# Patient Record
Sex: Male | Born: 1955 | Race: Black or African American | Hispanic: No | Marital: Single | Smoking: Former smoker
Health system: Southern US, Community
[De-identification: ages and names within clinical notes are randomized; demographics above are authoritative.]

## PROBLEM LIST (undated history)

## (undated) DIAGNOSIS — D649 Anemia, unspecified: Secondary | ICD-10-CM

## (undated) DIAGNOSIS — Q2730 Arteriovenous malformation, site unspecified: Secondary | ICD-10-CM

## (undated) DIAGNOSIS — I4891 Unspecified atrial fibrillation: Secondary | ICD-10-CM

## (undated) DIAGNOSIS — I10 Essential (primary) hypertension: Secondary | ICD-10-CM

## (undated) DIAGNOSIS — Z860100 Personal history of colon polyps, unspecified: Secondary | ICD-10-CM

## (undated) DIAGNOSIS — I5022 Chronic systolic (congestive) heart failure: Secondary | ICD-10-CM

## (undated) DIAGNOSIS — I469 Cardiac arrest, cause unspecified: Secondary | ICD-10-CM

## (undated) DIAGNOSIS — I428 Other cardiomyopathies: Secondary | ICD-10-CM

## (undated) DIAGNOSIS — J189 Pneumonia, unspecified organism: Secondary | ICD-10-CM

## (undated) HISTORY — DX: Anemia, unspecified: D64.9

## (undated) HISTORY — DX: Essential (primary) hypertension: I10

## (undated) HISTORY — DX: Pneumonia, unspecified organism: J18.9

## (undated) HISTORY — DX: Personal history of colonic polyps: Z86.010

## (undated) HISTORY — DX: Arteriovenous malformation, site unspecified: Q27.30

## (undated) HISTORY — DX: Personal history of colon polyps, unspecified: Z86.0100

## (undated) HISTORY — DX: Unspecified atrial fibrillation: I48.91

## (undated) HISTORY — DX: Cardiac arrest, cause unspecified: I46.9

## (undated) HISTORY — DX: Chronic systolic (congestive) heart failure: I50.22

---

## 1996-11-15 DIAGNOSIS — I469 Cardiac arrest, cause unspecified: Secondary | ICD-10-CM

## 1996-11-15 HISTORY — PX: CARDIAC DEFIBRILLATOR PLACEMENT: SHX171

## 1996-11-15 HISTORY — DX: Cardiac arrest, cause unspecified: I46.9

## 2006-08-17 ENCOUNTER — Ambulatory Visit: Payer: Self-pay | Admitting: Cardiology

## 2006-09-13 ENCOUNTER — Inpatient Hospital Stay (HOSPITAL_COMMUNITY): Admission: AD | Admit: 2006-09-13 | Discharge: 2006-09-16 | Payer: Self-pay | Admitting: Internal Medicine

## 2006-09-13 ENCOUNTER — Ambulatory Visit: Payer: Self-pay | Admitting: Cardiology

## 2006-09-21 ENCOUNTER — Ambulatory Visit: Payer: Self-pay | Admitting: Cardiology

## 2007-04-21 ENCOUNTER — Ambulatory Visit: Payer: Self-pay | Admitting: Cardiology

## 2007-05-01 ENCOUNTER — Ambulatory Visit: Payer: Self-pay | Admitting: Internal Medicine

## 2007-06-08 ENCOUNTER — Ambulatory Visit: Payer: Self-pay | Admitting: Cardiology

## 2007-07-24 ENCOUNTER — Ambulatory Visit: Payer: Self-pay | Admitting: Cardiology

## 2007-08-28 ENCOUNTER — Ambulatory Visit: Payer: Self-pay | Admitting: Internal Medicine

## 2007-10-24 ENCOUNTER — Ambulatory Visit: Payer: Self-pay

## 2007-11-02 ENCOUNTER — Ambulatory Visit: Payer: Self-pay | Admitting: Cardiology

## 2007-11-15 ENCOUNTER — Encounter: Payer: Self-pay | Admitting: Cardiology

## 2007-11-15 ENCOUNTER — Ambulatory Visit: Payer: Self-pay | Admitting: Internal Medicine

## 2007-11-15 ENCOUNTER — Ambulatory Visit (HOSPITAL_COMMUNITY): Admission: RE | Admit: 2007-11-15 | Discharge: 2007-11-15 | Payer: Self-pay | Admitting: Internal Medicine

## 2007-11-23 ENCOUNTER — Ambulatory Visit: Payer: Self-pay

## 2007-11-24 ENCOUNTER — Ambulatory Visit: Payer: Self-pay | Admitting: Cardiology

## 2007-12-14 ENCOUNTER — Ambulatory Visit: Payer: Self-pay | Admitting: Cardiology

## 2008-02-02 ENCOUNTER — Ambulatory Visit: Payer: Self-pay | Admitting: Internal Medicine

## 2008-05-31 ENCOUNTER — Ambulatory Visit: Payer: Self-pay | Admitting: Internal Medicine

## 2008-07-30 ENCOUNTER — Encounter: Payer: Self-pay | Admitting: Cardiology

## 2008-09-03 ENCOUNTER — Ambulatory Visit: Payer: Self-pay | Admitting: Cardiology

## 2008-09-13 ENCOUNTER — Ambulatory Visit: Payer: Self-pay | Admitting: Internal Medicine

## 2009-01-24 ENCOUNTER — Encounter: Payer: Self-pay | Admitting: Internal Medicine

## 2009-01-31 ENCOUNTER — Ambulatory Visit: Payer: Self-pay | Admitting: Internal Medicine

## 2009-03-13 ENCOUNTER — Ambulatory Visit: Payer: Self-pay | Admitting: Cardiology

## 2009-03-20 ENCOUNTER — Encounter: Payer: Self-pay | Admitting: Cardiology

## 2009-05-09 ENCOUNTER — Encounter: Payer: Self-pay | Admitting: Internal Medicine

## 2009-05-09 ENCOUNTER — Ambulatory Visit: Payer: Self-pay

## 2009-07-29 DIAGNOSIS — I5022 Chronic systolic (congestive) heart failure: Secondary | ICD-10-CM | POA: Insufficient documentation

## 2009-07-29 DIAGNOSIS — I4891 Unspecified atrial fibrillation: Secondary | ICD-10-CM | POA: Insufficient documentation

## 2009-08-22 ENCOUNTER — Encounter: Payer: Self-pay | Admitting: Internal Medicine

## 2009-08-22 ENCOUNTER — Ambulatory Visit: Payer: Self-pay | Admitting: Cardiology

## 2009-10-03 ENCOUNTER — Encounter: Payer: Self-pay | Admitting: Cardiology

## 2009-11-05 ENCOUNTER — Encounter: Payer: Self-pay | Admitting: Cardiology

## 2009-11-10 ENCOUNTER — Ambulatory Visit: Payer: Self-pay | Admitting: Cardiology

## 2009-11-10 DIAGNOSIS — Z95810 Presence of automatic (implantable) cardiac defibrillator: Secondary | ICD-10-CM | POA: Insufficient documentation

## 2009-12-26 ENCOUNTER — Ambulatory Visit: Payer: Self-pay | Admitting: Internal Medicine

## 2010-04-10 ENCOUNTER — Ambulatory Visit: Payer: Self-pay | Admitting: Internal Medicine

## 2010-04-17 ENCOUNTER — Encounter: Payer: Self-pay | Admitting: Internal Medicine

## 2010-04-17 ENCOUNTER — Ambulatory Visit: Payer: Self-pay | Admitting: Cardiology

## 2010-04-28 ENCOUNTER — Encounter: Payer: Self-pay | Admitting: Internal Medicine

## 2010-04-28 ENCOUNTER — Telehealth: Payer: Self-pay | Admitting: Internal Medicine

## 2010-05-05 ENCOUNTER — Encounter: Payer: Self-pay | Admitting: Physician Assistant

## 2010-05-05 ENCOUNTER — Ambulatory Visit: Payer: Self-pay | Admitting: Cardiology

## 2010-05-06 ENCOUNTER — Telehealth (INDEPENDENT_AMBULATORY_CARE_PROVIDER_SITE_OTHER): Payer: Self-pay | Admitting: *Deleted

## 2010-05-15 ENCOUNTER — Encounter: Payer: Self-pay | Admitting: Internal Medicine

## 2010-05-20 ENCOUNTER — Encounter (INDEPENDENT_AMBULATORY_CARE_PROVIDER_SITE_OTHER): Payer: Self-pay | Admitting: *Deleted

## 2010-05-22 ENCOUNTER — Ambulatory Visit (HOSPITAL_COMMUNITY): Admission: RE | Admit: 2010-05-22 | Discharge: 2010-05-22 | Payer: Self-pay | Admitting: Internal Medicine

## 2010-05-22 ENCOUNTER — Ambulatory Visit: Payer: Self-pay | Admitting: Internal Medicine

## 2010-06-01 ENCOUNTER — Encounter: Payer: Self-pay | Admitting: Internal Medicine

## 2010-06-01 ENCOUNTER — Ambulatory Visit: Payer: Self-pay

## 2010-06-26 ENCOUNTER — Ambulatory Visit: Payer: Self-pay | Admitting: Internal Medicine

## 2010-07-17 ENCOUNTER — Ambulatory Visit: Payer: Self-pay | Admitting: Internal Medicine

## 2010-08-04 ENCOUNTER — Ambulatory Visit: Payer: Self-pay | Admitting: Cardiology

## 2010-08-12 ENCOUNTER — Telehealth (INDEPENDENT_AMBULATORY_CARE_PROVIDER_SITE_OTHER): Payer: Self-pay | Admitting: *Deleted

## 2010-08-21 ENCOUNTER — Encounter: Payer: Self-pay | Admitting: Cardiology

## 2010-08-21 ENCOUNTER — Ambulatory Visit: Payer: Self-pay | Admitting: Cardiology

## 2010-09-10 ENCOUNTER — Ambulatory Visit: Payer: Self-pay | Admitting: Internal Medicine

## 2010-09-30 ENCOUNTER — Encounter: Payer: Self-pay | Admitting: Cardiology

## 2010-12-07 ENCOUNTER — Encounter: Payer: Self-pay | Admitting: Internal Medicine

## 2010-12-07 ENCOUNTER — Ambulatory Visit
Admission: RE | Admit: 2010-12-07 | Discharge: 2010-12-07 | Payer: Self-pay | Source: Home / Self Care | Attending: Internal Medicine | Admitting: Internal Medicine

## 2010-12-15 NOTE — Medication Information (Signed)
Summary: transferring CCR care from Dr. Linna Darner [facility])  Anticoagulant Therapy  Managed by: Vashti Hey, RN PCP: [place] Miners MD: Antoine Poche MD, Fayrene Fearing Indication 1: Atrial Fibrillation Lab Used: [facility] [place] Site: [place] INR POC 1.6  Dietary changes: no    Health status changes: no    Bleeding/hemorrhagic complications: no    Recent/future hospitalizations: no    Any changes in medication regimen? no    Recent/future dental: no  Any missed doses?: no       Is patient compliant with meds? yes      Comments: Coumadin has been managed by Dr Linna Darner.  Will be having managed here now.  Allergies: No Known Drug Allergies  Anticoagulation Management History:      The patient is taking warfarin and comes in today for a routine follow up visit.  Negative risk factors for bleeding include an age less than 87 years old.  The bleeding index is 'low risk'.  Positive CHADS2 values include History of CHF and History of HTN.  Negative CHADS2 values include Age > 87 years old.  Anticoagulation responsible provider: Antoine Poche MD, Fayrene Fearing.  INR POC: 1.6.  Cuvette Lot#: [id number].    Anticoagulation Management Assessment/Plan:      The patient's current anticoagulation dose is Warfarin sodium 5 mg tabs: Use as directed by Dr. Linna Darner, Warfarin sodium 2 mg tabs: use as directed.  The target INR is 2.0-3.0.  The next INR is due 08/14/2010.  Anticoagulation instructions were given to patient.  Results were reviewed/authorized by Vashti Hey, RN.  He was notified by Vashti Hey RN.        Coagulation management information includes: Pt has been taking 7.5mg  once daily.  Has been on and off dose for dental extractions.  Current Anticoagulation Instructions: INR 1.6 Take coumadin 10mg  tonight and tomorrow night then resume 7.5mg  once daily

## 2010-12-15 NOTE — Cardiovascular Report (Signed)
Summary: Card Device Clinic/ INTERROGATION QUICK LOOK  Card Device Clinic/ INTERROGATION QUICK LOOK   Imported By: Dorise Hiss 09/11/2010 12:24:45  _____________________________________________________________________  External Attachment:    Type:   Image     Comment:   External Document

## 2010-12-15 NOTE — Miscellaneous (Signed)
Summary: 2 D ECHO  Clinical Lists Changes

## 2010-12-15 NOTE — Assessment & Plan Note (Signed)
Summary: pacer ck per Belenda Cruise needs to see Dr. Johney Frame on this visit/ vs      Allergies Added: NKDA  Visit Type:  Pacemaker check Primary Provider:  [facility]   History of Present Illness: The patient presents today for routine electrophysiology followup. He reports doing very well since last being seen in our clinic. His ICD pocket has healed nicely s/p pocket revision by me due to threatened erosion.  The device was placed into a subpectoral pocket 7/11. He continues to have poor dentition which has not been  addressed. The patient denies symptoms of palpitations, fevers/ chills, chest pain, shortness of breath, orthopnea, PND, lower extremity edema, dizziness, presyncope, syncope, or neurologic sequela. The patient is tolerating medications without difficulties and is otherwise without complaint today.   Preventive Screening-Counseling & Management  Alcohol-Tobacco     Smoking Status: quit     Year Quit: 1998  Current Medications (verified): 1)  Potassium Chloride Cr 10 Meq Cr-Caps (Potassium Chloride) .... Take One Tablet By Mouth Daily 2)  Lisinopril 10 Mg Tabs (Lisinopril) .... Take 1 Tablet By Mouth Once A Day 3)  Diltiazem Hcl Er Beads 180 Mg Xr24h-Cap (Diltiazem Hcl Er Beads) .... Take One Capsule By Mouth Daily 4)  Metoprolol Tartrate 100 Mg Tabs (Metoprolol Tartrate) .... Take One Tablet By Mouth Twice A Day 5)  Finasteride 5 Mg Tabs (Finasteride) .... Take 1 Tablet By Mouth Once A Day 6)  Warfarin Sodium 5 Mg Tabs (Warfarin Sodium) .... Use As Directed By Dr. Linna Darner 7)  Miralax  Powd (Polyethylene Glycol 3350) .... Take As Directed 8)  Warfarin Sodium 2 Mg Tabs (Warfarin Sodium) .... Use As Directed 9)  Vitamin D3 2000 Unit Caps (Cholecalciferol) .... Take 1 Tablet By Mouth Once A Day  Allergies (verified): No Known Drug Allergies  Comments:  Nurse/Medical Assistant: The patient is currently on medications but does not know the name or dosage at this time.  Instructed to contact our office with details. Will update medication list at that time.  Past History:  Past Medical History: Reviewed history from 08/21/2010 and no changes required. SYSTOLIC HEART FAILURE, CHRONIC (ICD-428.22) HYPERTENSION, UNSPECIFIED (ICD-401.9) ATRIAL FIBRILLATION (ICD-427.31) TACHYCARDIA (ICD-785) 1. Tachycardia - induced cardiomyopathy.     a.     Normalized LVF...EF 35-40% by 2-D echo, 6/11     b.     Status post Medtronic ICD implantation. 2. Permanent atrial fibrillation.     a.     Possible TIKOSYN intolerance. 3. Chronic systolic heart failure.     a.     Current NYHA class I heart failure. 4. Chronic Coumadin.     a.     Followed by Dr. Linna Darner. 5. Hypertension.     Past Surgical History: s/p ICD 11/15/07 with subsequent pocket revision 7/11 for threatened erosion  Social History: Reviewed history from 07/29/2009 and no changes required. Disabled  Single  Tobacco Use - Former.  Alcohol Use - no  Review of Systems       All systems are reviewed and negative except as listed in the HPI.   Vital Signs:  Patient profile:   55 year old male Height:      77 inches Weight:      291 pounds Pulse rate:   73 / minute BP sitting:   123 / 81  (left arm) Cuff size:   large  Vitals Entered By: Carlye Grippe (September 10, 2010 1:44 PM)  Physical Exam  General:  obese, chronically ill  Head:  normocephalic and atraumatic Eyes:  PERRLA/EOM intact; conjunctiva and lids normal. Mouth:  very poor dentition without obvious infection today Neck:  supple Chest Wall:  threatened erosion appears resolved no evidence of infection recent incision is well healed Lungs:  Clear bilaterally to auscultation and percussion. Heart:  iRRR, no m/r/g Abdomen:  Bowel sounds positive; abdomen soft and non-tender without masses, organomegaly, or hernias noted. No hepatosplenomegaly. Msk:  Back normal, normal gait. Muscle strength and tone normal. Pulses:  pulses  normal in all 4 extremities Extremities:  No clubbing or cyanosis. Neurologic:  Alert and oriented x 3.    ICD Specifications Following MD:  Hillis Range, MD     Referring MD:  [facility] ICD Vendor:  Medtronic     ICD Model Number:  [id number]     ICD Serial Number:  [id number] ICD DOI:  11/15/2007     ICD Implanting MD:  Sherryl Manges, MD  Lead 1:    Location: RA     DOI: 07/23/1997     Model #: 6940     Serial #: [id number]     Status: active Lead 2:    Location: RV     DOI: 07/23/1997     Model #: 6945     Serial #: [id number]     Status: active Lead 3:    Location: RV     DOI: 07/23/1997     Model #: [id number]     Serial #: [id number]     Status: active  Indications::  NICM   ICD Follow Up Battery Voltage:  2.92 V     Charge Time:  10.0 seconds     Underlying rhythm:  SR ICD Dependent:  No       ICD Device Measurements Atrium:  Amplitude: 0.4 mV, Impedance: 528 ohms,  Right Ventricle:  Amplitude: 5.4 mV, Impedance: 208 ohms, Threshold: 1.0 V at 0.4 msec Shock Impedance: 41/65 ohms   Episodes MS Episodes:  1     Percent Mode Switch:  100%     Coumadin:  Yes Shock:  0     ATP:  0     Nonsustained:  0     Atrial Therapies:  0 Atrial Pacing:  1.1%     Ventricular Pacing:  20.5%  Brady Parameters Mode DDIR     Lower Rate Limit:  50     Upper Rate Limit 130 PAV 300      Tachy Zones VF:  222     Next Cardiology Appt Due:  11/16/2010 Tech Comments:  PT IN AF 100% OF TIME. + COUMADIN.  RV LEAD IMPEDANCE 208 ON TODAYS CHECK.  RV LEAD ALERT TURNED OFF AT LAST VISIT.  DECREASED VP FROM LAST VISIT. NO CHANGES MADE. ROV IN 3 MTHS W/DEVICE CLINIC. sign MD Comments:  agree  Impression & Recommendations:  Problem # 1:  SYSTOLIC HEART FAILURE, CHRONIC (ICD-428.22) stable without active chf on exam normal ICD function given high risk for infection, I opted to not revise RV lead during pocket revision for threatened erosion 7/11 he has a chronically low RV lead pace/sense impedance which appears to  be stable,  V pacing much demenished since last visit  Problem # 2:  ATRIAL FIBRILLATION (ICD-427.31) probably premanent afib continue coumadin rate controlled  Problem # 3:  HYPERTENSION, UNSPECIFIED (ICD-401.9) stable  Patient Instructions: 1)  return in 3 months to the device clinic.

## 2010-12-15 NOTE — Cardiovascular Report (Signed)
Summary: Card Device Clinic/ INTERROGATION QUICK LOOK  Card Device Clinic/ INTERROGATION QUICK LOOK   Imported By: Dorise Hiss 04/16/2010 10:57:50  _____________________________________________________________________  External Attachment:    Type:   Image     Comment:   External Document

## 2010-12-15 NOTE — Letter (Signed)
Summary: Engineer, materials at [facility]  [address]   [place], [place] [address]   Phone: [phone]  Fax: [phone]        May 20, 2010 MRN: [id number]   [name] [name] [address] [place], [place]  [address]   Dear [name] [name],  Your test ordered by Selena Batten has been reviewed by your physician (or physician assistant) and was found to be normal or stable. Your physician (or physician assistant) felt no changes were needed at this time.  ____ Echocardiogram  ____ Cardiac Stress Test  ____ Lab Work  ____ Peripheral vascular study of arms, legs or neck  __X__ CT scan or X-ray - chest x-ray stable, no congestive heart failure  ____ Lung or Breathing test  ____ Other:   Thank you.   Hoover Brunette, LPN    Duane Boston, M.D., F.A.C.C. Thressa Sheller, M.D., F.A.C.C. Oneal Grout, M.D., F.A.C.C. Cheree Ditto, M.D., F.A.C.C. Daiva Nakayama, M.D., F.A.C.C. Kenney Houseman, M.D., F.A.C.C. Jeanne Ivan, PA-C

## 2010-12-15 NOTE — Letter (Signed)
Summary: Implantable Device Instructions  Architectural technologist, Main Office  [address]   [place], [place] [address]   Phone: [phone]  Fax: [phone]      Implantable Device Instructions  You are scheduled for:  _____ Permanent Transvenous Pacemaker _____ Implantable Cardioverter Defibrillator _____ Implantable Loop Recorder _____ Generator Change __X___ Pocket Revision  on FRIDAY, May 22, 2010 with Dr. Johney Frame.  1.  Please arrive at the Short Stay Center at [facility] at 10 AM on the day of your procedure.  2.  Do not eat or drink after midnight the night before your procedure.  3.  Complete lab work on May 15, 2010 AT THE [facility] IN [place].  You do not have to be fasting.  4.  Ok to take medications morning of procedure with water.  Take your last dose of Coumadin on 05-18-10.  5.  Plan for an overnight stay.  Bring your insurance cards and a list of your medications.  6.  Wash your chest and neck with antibacterial soap (any brand) the evening before and the morning of your procedure.  Rinse well.   *If you have ANY questions after you get home, please call the office [phone].  *Every attempt is made to prevent procedures from being rescheduled.  Due to the nauture of Electrophysiology, rescheduling can happen.  The physician is always aware and directs the staff when this occurs.

## 2010-12-15 NOTE — Procedures (Signed)
Summary: DF2      Allergies Added: NKDA  Current Medications (verified): 1)  Potassium Chloride Cr 10 Meq Cr-Caps (Potassium Chloride) .... Take One Tablet By Mouth Daily 2)  Lisinopril 5 Mg Tabs (Lisinopril) .... Take One Tablet By Mouth Daily 3)  Diltiazem Hcl Er Beads 180 Mg Xr24h-Cap (Diltiazem Hcl Er Beads) .... Take One Capsule By Mouth Daily 4)  Metoprolol Tartrate 100 Mg Tabs (Metoprolol Tartrate) .... Take One Tablet By Mouth Twice A Day 5)  Finasteride 5 Mg Tabs (Finasteride) .... Take 1 Tablet By Mouth Once A Day 6)  Warfarin Sodium 5 Mg Tabs (Warfarin Sodium) .... Use As Directed By Dr. Linna Darner 7)  Miralax  Powd (Polyethylene Glycol 3350) .... Take As Directed  Allergies (verified): No Known Drug Allergies    ICD Specifications Following MD:  Hillis Range, MD     Referring MD:  [facility] ICD Vendor:  Medtronic     ICD Model Number:  [id number]     ICD Serial Number:  [id number] ICD DOI:  11/15/2007     ICD Implanting MD:  Sherryl Manges, MD  Lead 1:    Location: RA     DOI: 07/23/1997     Model #: 6940     Serial #: [id number]     Status: active Lead 2:    Location: RV     DOI: 07/23/1997     Model #: 6945     Serial #: [id number]     Status: active Lead 3:    Location: RV     DOI: 07/23/1997     Model #: [id number]     Serial #: [id number]     Status: active  Indications::  NICM   ICD Follow Up Remote Check?  No Battery Voltage:  2.99 V     Charge Time:  9.8 seconds     Underlying rhythm:  AFIB ICD Dependent:  No       ICD Device Measurements Atrium:  Amplitude: 0.7 mV, Impedance: 496 ohms,  Right Ventricle:  Amplitude: 4.1 mV, Impedance: <200 ohms, Threshold: 0.5 V at 0.4 msec Shock Impedance: 46/63 ohms   Episodes MS Episodes:  1     Percent Mode Switch:  100%     Coumadin:  Yes Shock:  0     ATP:  0     Nonsustained:  0     Atrial Pacing:  2.6%     Ventricular Pacing:  67.6%  Brady Parameters Mode DDIR     Lower Rate Limit:  60     Upper Rate Limit 130 PAV 180        Tachy Zones VF:  200     Next Cardiology Appt Due:  01/13/2010 Tech Comments:  Normal device function.  RV impedence has always been around 200ohms.  First reading of <200.  Sensing and thresholds still good.  No changes made today.  Pt also has a red area at lower lateral aspect of device can.  No skin breakdown.  Pt advised to keep close eye on this area.  Will schedule ROV in 1 month with Dr Johney Frame for follow up of RV lead and skin. Gypsy Balsam RN BSN  December 26, 2009 9:11 AM  MD Comments:  will follow RV lead for now.  NO noise or evidence of lead dysfunction.

## 2010-12-15 NOTE — Assessment & Plan Note (Signed)
Summary: 3 month fu -recv reminder vs      Allergies Added: NKDA  Visit Type:  Follow-up Primary Abbie Berling:  [facility]   History of Present Illness: patient presents for six-month followup.  He presents with no signs or symptoms suggestive of decompensated heart failure. He denies tachypalpitations, chest pain, or firing of his ICD device. He is compliant with his medications, weighs himself daily, and refrains from added salt in his diet.   Patient is currently in the process of establishing with a new primary care physician. He is also deciding as to whether or not to establish with our Coumadin clinic, versus continue to have it followed by a primary M.D.    Preventive Screening-Counseling & Management  Alcohol-Tobacco     Smoking Status: quit     Year Quit: 1998  Current Medications (verified): 1)  Potassium Chloride Cr 10 Meq Cr-Caps (Potassium Chloride) .... Take One Tablet By Mouth Daily 2)  Lisinopril 10 Mg Tabs (Lisinopril) .... Take 1 Tablet By Mouth Once A Day 3)  Diltiazem Hcl Er Beads 180 Mg Xr24h-Cap (Diltiazem Hcl Er Beads) .... Take One Capsule By Mouth Daily 4)  Metoprolol Tartrate 100 Mg Tabs (Metoprolol Tartrate) .... Take One Tablet By Mouth Twice A Day 5)  Finasteride 5 Mg Tabs (Finasteride) .... Take 1 Tablet By Mouth Once A Day 6)  Warfarin Sodium 5 Mg Tabs (Warfarin Sodium) .... Use As Directed By Dr. Linna Darner 7)  Miralax  Powd (Polyethylene Glycol 3350) .... Take As Directed 8)  Warfarin Sodium 2 Mg Tabs (Warfarin Sodium) .... Use As Directed 9)  Vitamin D3 2000 Unit Caps (Cholecalciferol) .... Take 1 Tablet By Mouth Once A Day  Allergies (verified): No Known Drug Allergies  Comments:  Nurse/Medical Assistant: The patient's medication bottles and allergies were reviewed with the patient and were updated in the Medication and Allergy Lists.  Past History:  Past Medical History: SYSTOLIC HEART FAILURE, CHRONIC (ICD-428.22) HYPERTENSION,  UNSPECIFIED (ICD-401.9) ATRIAL FIBRILLATION (ICD-427.31) TACHYCARDIA (ICD-785) 1. Tachycardia - induced cardiomyopathy.     a.     Normalized LVF...EF 35-40% by 2-D echo, 6/11     b.     Status post Medtronic ICD implantation. 2. Permanent atrial fibrillation.     a.     Possible TIKOSYN intolerance. 3. Chronic systolic heart failure.     a.     Current NYHA class I heart failure. 4. Chronic Coumadin.     a.     Followed by Dr. Linna Darner. 5. Hypertension.     Review of Systems       No fevers, chills, hemoptysis, dysphagia, melena, hematocheezia, hematuria, rash, claudication, orthopnea, pnd, pedal edema. All other systems negative.   Vital Signs:  Patient profile:   55 year old male Height:      77 inches Weight:      288 pounds BMI:     34.28 Pulse rate:   83 / minute BP sitting:   118 / 79  (left arm) Cuff size:   large  Vitals Entered By: Carlye Grippe (August 21, 2010 1:26 PM)  Nutrition Counseling: Patient's BMI is greater than 25 and therefore counseled on weight management options.  Physical Exam  Additional Exam:  GEN: 55 year old male comes in the right, no distress HEENT: NCAT,PERRLA,EOMI NECK: palpable pulses, no bruits; no JVD; no TM LUNGS: CTA bilaterally HEART: irregularly irregular (S1S2); no significant murmurs; no rubs; no gallops ABD: soft, NT; intact BS EXT: trace edema SKIN: warm, dry  MUSC: no obvious deformity NEURO: A/O (x3)      ICD Specifications Following MD:  Hillis Range, MD     Referring MD:  [facility] ICD Vendor:  Medtronic     ICD Model Number:  [id number]     ICD Serial Number:  [id number] ICD DOI:  11/15/2007     ICD Implanting MD:  Sherryl Manges, MD  Lead 1:    Location: RA     DOI: 07/23/1997     Model #: 6940     Serial #: [id number]     Status: active Lead 2:    Location: RV     DOI: 07/23/1997     Model #: 6945     Serial #: [id number]     Status: active Lead 3:    Location: RV     DOI: 07/23/1997     Model #: 0865H     Serial #:  [id number]     Status: active  Indications::  NICM   ICD Follow Up ICD Dependent:  No      Episodes Coumadin:  Yes  Brady Parameters Mode DDIR     Lower Rate Limit:  60     Upper Rate Limit 130 PAV 300      Tachy Zones VF:  222     Impression & Recommendations:  Problem # 1:  ATRIAL FIBRILLATION (ICD-427.31)  patient is in permanent atrial fibrillation, by recent ICD check. Adequate rate control on current medication regimen. On chronic Coumadin, in the process of deciding whether or not to continue monitoring by a primary M.D., versus establish with our Coumadin clinic.  Orders: T-Basic Metabolic Panel [phone]) T-Magnesium [phone])  Problem # 2:  SYSTOLIC HEART FAILURE, CHRONIC (ICD-428.22)  euvolemic, by history and physical examination. Patient has lost 1 pound, since his last visit.. reports compliance with medications, and refrains from added salt in his diet. Will check baseline labs. Of note, patient is on supplemental potassium, but is not on a diuretic. of note, will review clinical benefit of substituting short-acting metoprolol with Toprol, versus carvedilol, for treatment of chronic systolic HF (presumed tachycardia induced CM). We'll also reassess utility of diltiazem, in this setting of cardiomyopathy. Recent echo indicated EF 35-40%. Will discuss with Dr. Andee Lineman.  Orders: T-Basic Metabolic Panel [phone]) T-Magnesium [phone])  Problem # 3:  IMPLANTATION OF DEFIBRILLATOR, HX OF (ICD-V45.02)  follow up with Dr. Johney Frame later this month, as scheduled.  Patient Instructions: 1)  Labs:  BMET, Magnesium 2)  Warfarin refills to be maintained by MD dosing. 3)  Follow up in  6 months

## 2010-12-15 NOTE — Progress Notes (Signed)
Summary: cxl appt   Phone Note Call from Patient Call back at Home Phone [phone]   Caller: Patient Reason for Call: Talk to Nurse Summary of Call: Ajeet called to cancel appointment on Friday.  Said he needed to speak/see his primary and he would have him send him back to [place]????? Initial call taken by: Claudette Laws,  August 12, 2010 12:44 PM  Follow-up for Phone Call        Noted. Follow-up by: Vashti Hey RN,  August 21, 2010 2:49 PM

## 2010-12-15 NOTE — Assessment & Plan Note (Signed)
Summary: 845/rv lead impedance check      Allergies Added: NKDA  Current Medications (verified): 1)  Potassium Chloride Cr 10 Meq Cr-Caps (Potassium Chloride) .... Take One Tablet By Mouth Daily 2)  Lisinopril 10 Mg Tabs (Lisinopril) .... Take 1 Tablet By Mouth Once A Day 3)  Diltiazem Hcl Er Beads 180 Mg Xr24h-Cap (Diltiazem Hcl Er Beads) .... Take One Capsule By Mouth Daily 4)  Metoprolol Tartrate 100 Mg Tabs (Metoprolol Tartrate) .... Take One Tablet By Mouth Twice A Day 5)  Finasteride 5 Mg Tabs (Finasteride) .... Take 1 Tablet By Mouth Once A Day 6)  Warfarin Sodium 5 Mg Tabs (Warfarin Sodium) .... Use As Directed By Dr. Linna Darner 7)  Miralax  Powd (Polyethylene Glycol 3350) .... Take As Directed 8)  Warfarin Sodium 2 Mg Tabs (Warfarin Sodium) .... Use As Directed 9)  Vitamin D3 2000 Unit Caps (Cholecalciferol) .... Take 1 Tablet By Mouth Once A Day  Allergies (verified): No Known Drug Allergies    ICD Specifications Following MD:  Hillis Range, MD     Referring MD:  [facility] ICD Vendor:  Medtronic     ICD Model Number:  [id number]     ICD Serial Number:  [id number] ICD DOI:  11/15/2007     ICD Implanting MD:  Sherryl Manges, MD  Lead 1:    Location: RA     DOI: 07/23/1997     Model #: 6940     Serial #: [id number]     Status: active Lead 2:    Location: RV     DOI: 07/23/1997     Model #: 6945     Serial #: [id number]     Status: active Lead 3:    Location: RV     DOI: 07/23/1997     Model #: 0865H     Serial #: [id number]     Status: active  Indications::  NICM   ICD Follow Up Battery Voltage:  2.95 V     Charge Time:  10.0 seconds     Underlying rhythm:  AF ICD Dependent:  No       ICD Device Measurements Atrium:  Impedance: 496 ohms,  Right Ventricle:  Impedance: 216 ohms, Threshold: 1.0 V at 0.4 msec Shock Impedance: 41/67 ohms   Episodes MS Episodes:  1     Percent Mode Switch:  110%     Coumadin:  Yes Shock:  0     ATP:  0     Nonsustained:  0     Atrial Therapies:   0 Ventricular Pacing:  57%  Brady Parameters Mode DDIR     Lower Rate Limit:  60     Upper Rate Limit 130 PAV 300      Tachy Zones VF:  222     Next Cardiology Appt Due:  09/10/2010 Tech Comments:  PT IN AF 100% OF TIME. + COUMADIN.  RV LEAD IMPEDANCE ON TODAYS CHECK IS 216.  LAST CHECK ALERT TURNED OFF DUE TO RV LEAD IMPEDANCE DROPPING BELOW 200 ohms. CHANGED LRL FROM 60 TO 50 bpm AND PAV DELAY FROM 180 TO TO ALLOW FOR INTRINSIC.  ROV IN OCTOBER W/JA IN [place]. PT TO START HAVING COUMADIN LEVELS CHECKED AT [facility]. Vella Kohler  July 17, 2010 9:34 AM

## 2010-12-15 NOTE — Assessment & Plan Note (Signed)
Summary: 6 MO FU PER JUNE REMINDER-SRS  Medications Added LISINOPRIL 10 MG TABS (LISINOPRIL) Take 1 tablet by mouth once a day WARFARIN SODIUM 2 MG TABS (WARFARIN SODIUM) use as directed VITAMIN D3 2000 UNIT CAPS (CHOLECALCIFEROL) Take 1 tablet by mouth once a day      Allergies Added: NKDA  Visit Type:  Follow-up Primary Provider:  Linna Darner   History of Present Illness: the patient is a 55 year old male with history of nonischemic dilated cardiomyopathy and chronic systolic heart failure. The patient most recent ejection fraction is 35-40%. He denies any recent heart failure exacerbations. The patient is on chronic Coumadin therapy for history of permanent atrial fibrillation. He failed dofetilide therapy in the past. He remains in NYHA class I. He denies any orthopnea PND palpitations or syncope. He had a device implant for primary prevention. The patient has seen Dr. all readrecently. He was felt that his device was migrating laterally and is scheduled for revision on July 8 for his ICD.  the patient has poor dentition. I gave the patient referral to the dental clinic at [place]. It appears that he needs several teeth pulled. From a cardiac standpoint however he has remained stable.  Preventive Screening-Counseling & Management  Alcohol-Tobacco     Smoking Status: quit     Year Quit: 1998  Current Medications (verified): 1)  Potassium Chloride Cr 10 Meq Cr-Caps (Potassium Chloride) .... Take One Tablet By Mouth Daily 2)  Lisinopril 10 Mg Tabs (Lisinopril) .... Take 1 Tablet By Mouth Once A Day 3)  Diltiazem Hcl Er Beads 180 Mg Xr24h-Cap (Diltiazem Hcl Er Beads) .... Take One Capsule By Mouth Daily 4)  Metoprolol Tartrate 100 Mg Tabs (Metoprolol Tartrate) .... Take One Tablet By Mouth Twice A Day 5)  Finasteride 5 Mg Tabs (Finasteride) .... Take 1 Tablet By Mouth Once A Day 6)  Warfarin Sodium 5 Mg Tabs (Warfarin Sodium) .... Use As Directed By Dr. Linna Darner 7)  Miralax  Powd  (Polyethylene Glycol 3350) .... Take As Directed 8)  Warfarin Sodium 2 Mg Tabs (Warfarin Sodium) .... Use As Directed 9)  Vitamin D3 2000 Unit Caps (Cholecalciferol) .... Take 1 Tablet By Mouth Once A Day  Allergies (verified): No Known Drug Allergies  Comments:  Nurse/Medical Assistant: The patient's medication bottles and allergies were reviewed with the patient and were updated in the Medication and Allergy Lists.  Past History:  Past Medical History: Last updated: 11/10/2009 SYSTOLIC HEART FAILURE, CHRONIC (ICD-428.22) HYPERTENSION, UNSPECIFIED (ICD-401.9) ATRIAL FIBRILLATION (ICD-427.31) TACHYCARDIA (ICD-785) 1. Tachycardia - induced cardiomyopathy.     a.     Normalized LVF.     b.     Status post Medtronic ICD implantation. 2. Permanent atrial fibrillation.     a.     Possible TIKOSYN intolerance. 3. Chronic systolic heart failure.     a.     Current NYHA class I heart failure. 4. Chronic Coumadin.     a.     Followed by Dr. Linna Darner. 5. Hypertension.     Family History: Last updated: 07/29/2009 Family History of Diabetes:   Social History: Last updated: 07/29/2009 Disabled  Single  Tobacco Use - Former.  Alcohol Use - no  Risk Factors: Smoking Status: quit (05/05/2010)  Review of Systems  The patient denies fatigue, malaise, fever, weight gain/loss, vision loss, decreased hearing, hoarseness, chest pain, palpitations, shortness of breath, prolonged cough, wheezing, sleep apnea, coughing up blood, abdominal pain, blood in stool, nausea, vomiting, diarrhea, heartburn, incontinence, blood  in urine, muscle weakness, joint pain, leg swelling, rash, skin lesions, headache, fainting, dizziness, depression, anxiety, enlarged lymph nodes, easy bruising or bleeding, and environmental allergies.    Vital Signs:  Patient profile:   55 year old male Height:      77 inches Weight:      289 pounds Pulse rate:   76 / minute BP sitting:   125 / 80  (left arm) Cuff size:    large  Vitals Entered By: Carlye Grippe (May 05, 2010 8:23 AM)  Physical Exam  Additional Exam:  General: Well-developed, well-nourished in no distress head: Normocephalic and atraumatic eyes PERRLA/EOMI intact, conjunctiva and lids normal nose: No deformity or lesions mouth normal dentition, normal posterior pharynx neck: Supple, no JVD.  No masses, thyromegaly or abnormal cervical nodes lungs: Normal breath sounds bilaterally without wheezing.  Normal percussion heart: regular rate and rhythm with normal S1 and S2, no S3 or S4.  PMI is normal.  No pathological murmurs abdomen: Normal bowel sounds, abdomen is soft and nontender without masses, organomegaly or hernias noted.  No hepatosplenomegaly musculoskeletal: Back normal, normal gait muscle strength and tone normal pulsus: Pulse is normal in all 4 extremities Extremities: No peripheral pitting edema neurologic: Alert and oriented x 3 skin: Intact without lesions or rashes cervical nodes: No significant adenopathy psychologic: Normal affect    EKG  Procedure date:  05/05/2010  Findings:      atrial fibrillation. With occasional ventricular pacing. Heart rate 72 beats per minute.   ICD Specifications Following MD:  Hillis Range, MD     Referring MD:  [facility] ICD Vendor:  Medtronic     ICD Model Number:  [id number]     ICD Serial Number:  [id number] ICD DOI:  11/15/2007     ICD Implanting MD:  Sherryl Manges, MD  Lead 1:    Location: RA     DOI: 07/23/1997     Model #: [id number]     Serial #: [id number]     Status: active Lead 2:    Location: RV     DOI: 07/23/1997     Model #: [id number]     Serial #: [id number]     Status: active Lead 3:    Location: RV     DOI: 07/23/1997     Model #: [id number]     Serial #: [id number]     Status: active  Indications::  NICM   ICD Follow Up ICD Dependent:  No      Episodes Coumadin:  Yes  Brady Parameters Mode DDIR     Lower Rate Limit:  60     Upper Rate Limit 130 PAV 180      Tachy Zones VF:   200     Impression & Recommendations:  Problem # 1:  ATRIAL FIBRILLATION (ICD-427.31) patient is in permanent atrial fibrillation with ventricular pacing. He has failed dofetilide therapy. He now also has decreased LV function with ejection fraction of 35-40% His updated medication list for this problem includes:    Metoprolol Tartrate 100 Mg Tabs (Metoprolol tartrate) .Marland Kitchen... Take one tablet by mouth twice a day    Warfarin Sodium 5 Mg Tabs (Warfarin sodium) ..... Use as directed by dr. Linna Darner    Warfarin Sodium 2 Mg Tabs (Warfarin sodium) ..... Use as directed  Orders: EKG w/ Interpretation (93000) T-Chest x-ray, 2 views (71020)  Problem # 2:  SYSTOLIC HEART FAILURE, CHRONIC (ICD-428.22) EF 35-40% but no heart failure exacerbations. His updated medication  list for this problem includes:    Lisinopril 10 Mg Tabs (Lisinopril) .Marland Kitchen... Take 1 tablet by mouth once a day    Diltiazem Hcl Er Beads 180 Mg Xr24h-cap (Diltiazem hcl er beads) .Marland Kitchen... Take one capsule by mouth daily    Metoprolol Tartrate 100 Mg Tabs (Metoprolol tartrate) .Marland Kitchen... Take one tablet by mouth twice a day    Warfarin Sodium 5 Mg Tabs (Warfarin sodium) ..... Use as directed by dr. Linna Darner    Warfarin Sodium 2 Mg Tabs (Warfarin sodium) ..... Use as directed  Orders: T-Chest x-ray, 2 views (71020)  Problem # 3:  HYPERTENSION, UNSPECIFIED (ICD-401.9) blood pressure well-controlled. However given his heart failure we will increase his lisinopril to 10 mg p.o. q. daily His updated medication list for this problem includes:    Lisinopril 10 Mg Tabs (Lisinopril) .Marland Kitchen... Take 1 tablet by mouth once a day    Diltiazem Hcl Er Beads 180 Mg Xr24h-cap (Diltiazem hcl er beads) .Marland Kitchen... Take one capsule by mouth daily    Metoprolol Tartrate 100 Mg Tabs (Metoprolol tartrate) .Marland Kitchen... Take one tablet by mouth twice a day  Problem # 4:  IMPLANTATION OF DEFIBRILLATOR, HX OF (ICD-V45.02)  Orders: T-Chest x-ray, 2 views (32440)  Patient  Instructions: 1)  Lisinopril 10mg  daily 2)  Chest x-ray today 3)  Referral to [facility] ASAP 4)  Follow up in  3 months Prescriptions: LISINOPRIL 10 MG TABS (LISINOPRIL) Take 1 tablet by mouth once a day  #30 x 6   Entered by:   Hoover Brunette, LPN   Authorized by:   Lewayne Bunting, MD, [facility]   Signed by:   Hoover Brunette, LPN on 09/11/2535   Method used:   Electronically to        [facility] (retail)       [address]       [place], [place]  [address]       Ph: [phone]       Fax: [phone]   RxID:   [id number]  I have personnaly reviewed all medications changes and approved new prescriptions and refills. Lewayne Bunting, MD, [facility]  May 05, 2010 8:55 AM

## 2010-12-15 NOTE — Medication Information (Signed)
Summary: Coumadin Clinic  Anticoagulant Therapy  Managed by: Inactive PCP: Roswell Miners MD: Andee Lineman MD, Michelle Piper Indication 1: Atrial Fibrillation Lab Used: [facility] [place] Site: [place] INR POC 1.5  Dietary changes: no    Health status changes: no    Bleeding/hemorrhagic complications: no    Recent/future hospitalizations: no    Any changes in medication regimen? no    Recent/future dental: no  Any missed doses?: no       Is patient compliant with meds? no      Comments: See instructions that follow.  Allergies: No Known Drug Allergies  Anticoagulation Management History:      The patient is taking warfarin and comes in today for a routine follow up visit.  Negative risk factors for bleeding include an age less than 62 years old.  The bleeding index is 'low risk'.  Positive CHADS2 values include History of CHF and History of HTN.  Negative CHADS2 values include Age > 71 years old.  Anticoagulation responsible provider: Andee Lineman MD, Michelle Piper.  INR POC: 1.5.  Cuvette Lot#: [id number].    Anticoagulation Management Assessment/Plan:      The patient's current anticoagulation dose is Warfarin sodium 5 mg tabs: Use as directed by Dr. Linna Darner, Warfarin sodium 2 mg tabs: use as directed.  The target INR is 2.0-3.0.  The next INR is due 08/14/2010.  Anticoagulation instructions were given to patient.  Results were reviewed/authorized by Inactive.  He was notified by Vashti Hey RN.         Prior Anticoagulation Instructions: INR 1.6 Take coumadin 10mg  tonight and tomorrow night then resume 7.5mg  once daily   Current Anticoagulation Instructions: INR 1.5 Pt does not want to have INR followed here.  States he does not believe our machine is right.  States "it was always OK at Dr Beatriz Stallion office and he only had to have it checked 1 x month". Pt will not let me adjust dose today.  States he wants to wait until he gets a new PMD. Pt was advised against this.  Pt was informed he is  increasing his risk of CVA/blood clot not having a theraputic INR.  He verbalized understanding of this and left the office with INR of 1.5.

## 2010-12-15 NOTE — Cardiovascular Report (Signed)
Summary: Office Visit   Office Visit   Imported By: Roderic Ovens 06/24/2010 13:23:07  _____________________________________________________________________  External Attachment:    Type:   Image     Comment:   External Document

## 2010-12-15 NOTE — Progress Notes (Signed)
Summary: pocket revision and dental consult   Phone Note Outgoing Call Call back at [facility] Phone [phone]   Call placed by: Gypsy Balsam RN BSN,  April 28, 2010 2:21 PM Summary of Call: Spoke with patient about echo results.  Pocket revision scheduled for Friday, July 8th with Dr Johney Frame.  Instruction sheet mailed to patient.  Will D/W Dr Johney Frame possibility of getting Dr Kristin Bruins to consult on patient while in hospital for pocket revision to talk about having teeth pulled.  Pt found a clinic in [place] that might could help, but they have a long waiting list. Gypsy Balsam RN BSN  April 28, 2010 2:26 PM      Appended Document: Orders Update    Clinical Lists Changes  Orders: Added new Referral order of De-Fib (De-Fib) - Signed Added new Test order of T-Basic Metabolic Panel [phone]) - Signed Added new Test order of T-CBC w/Diff [id number]) - Signed Added new Test order of T-Protime, Auto ([id number]) - Signed Added new Test order of T-PTT ([id number]) - Signed

## 2010-12-15 NOTE — Progress Notes (Signed)
Summary: dental referral   Phone Note Outgoing Call   Call placed by: Hoover Brunette, LPN,  May 06, 2010 3:15 PM Call placed to: [facility] Summary of Call: Did contact above facility.  Submitted request for application form to be mailed to patient's home address.  The application will be mailed to pt within 10 days.  Patient notified.   Once application is mailed back in, the form will be placed in a pool of applicants where they will be randomly chosen at the first of every month.  If not chosen after 3 drawings, applicant will have to submit new application form.  Advised pt. of all of this and he states that he thinks he has found dental clinic in [place], [place] that will take him.  Will call us back at which time he has appt. scheduled in [place] or moves forward with application to [facility].   Initial call taken by: Hoover Brunette, LPN,  May 06, 2010 3:21 PM     Appended Document: dental referral Received call from pt. yesterday.  He has appt. with dental clinic in [place] for this Thursday.

## 2010-12-15 NOTE — Procedures (Signed)
Summary: wound check      Allergies Added: NKDA  Current Medications (verified): 1)  Potassium Chloride Cr 10 Meq Cr-Caps (Potassium Chloride) .... Take One Tablet By Mouth Daily 2)  Lisinopril 10 Mg Tabs (Lisinopril) .... Take 1 Tablet By Mouth Once A Day 3)  Diltiazem Hcl Er Beads 180 Mg Xr24h-Cap (Diltiazem Hcl Er Beads) .... Take One Capsule By Mouth Daily 4)  Metoprolol Tartrate 100 Mg Tabs (Metoprolol Tartrate) .... Take One Tablet By Mouth Twice A Day 5)  Finasteride 5 Mg Tabs (Finasteride) .... Take 1 Tablet By Mouth Once A Day 6)  Warfarin Sodium 5 Mg Tabs (Warfarin Sodium) .... Use As Directed By Dr. Linna Darner 7)  Miralax  Powd (Polyethylene Glycol 3350) .... Take As Directed 8)  Warfarin Sodium 2 Mg Tabs (Warfarin Sodium) .... Use As Directed 9)  Vitamin D3 2000 Unit Caps (Cholecalciferol) .... Take 1 Tablet By Mouth Once A Day  Allergies (verified): No Known Drug Allergies    ICD Specifications Following MD:  Hillis Range, MD     Referring MD:  [facility] ICD Vendor:  Medtronic     ICD Model Number:  [id number]     ICD Serial Number:  [id number] ICD DOI:  11/15/2007     ICD Implanting MD:  Sherryl Manges, MD  Lead 1:    Location: RA     DOI: 07/23/1997     Model #: 6940     Serial #: [id number]     Status: active Lead 2:    Location: RV     DOI: 07/23/1997     Model #: 6945     Serial #: [id number]     Status: active Lead 3:    Location: RV     DOI: 07/23/1997     Model #: [id number]     Serial #: [id number]     Status: active  Indications::  NICM   ICD Follow Up Battery Voltage:  2.96 V     Charge Time:  9.8 seconds     Underlying rhythm:  AF ICD Dependent:  No       ICD Device Measurements Atrium:  Amplitude: 0.4 mV, Impedance: 496 ohms,  Right Ventricle:  Amplitude: 3.4 mV, Impedance: 212 ohms, Threshold: 1.0 V at 0.4 msec Shock Impedance: 35/60 ohms   Episodes MS Episodes:  1     Percent Mode Switch:  100%     Coumadin:  Yes Shock:  0     ATP:  0     Nonsustained:  0      Atrial Pacing:  0.7%     Ventricular Pacing:  63.6%  Brady Parameters Mode DDIR     Lower Rate Limit:  60     Upper Rate Limit 130 PAV 180      Tachy Zones VF:  222     Next Cardiology Appt Due:  08/17/2010 Tech Comments:  WOUND CHECK---STERI STRIPS REMOVED.  NO REDNESS OR SWELLING AT SITE.  NORMAL DEVICE FUNCTION.  TURNED ON LIA --CHANGED LEAD IMPEDANCE MAX FOR ALERTS.  ROV IN 3 MTHS W/JA.  Vella Kohler  June 02, 2010 9:14 AM

## 2010-12-15 NOTE — Cardiovascular Report (Signed)
Summary: Office Visit   Office Visit   Imported By: Roderic Ovens 07/28/2010 11:52:21  _____________________________________________________________________  External Attachment:    Type:   Image     Comment:   External Document

## 2010-12-15 NOTE — Assessment & Plan Note (Signed)
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  Per Dr Koren Bound last note, he had an ICD implanted for primary prevention of sudden death and has had subsequent improvement in his ejection fraction.  He also has permanent afib and intermittent bradycardia for which he V paces.  He has had prior ICD shocks for afib with RVR but not VT. He now presents complaining of pain over the lateral border of his ICD pocket.  He reports that his ICD "shifted".  This area has become progressively indurated.  He also has chronic difficulty with poor dentition but has been noncompliant with teeth extraction/ care.   He states he has gained weight but he remains in NYHA class I. He denies any orthopnea PND he has no palpitations or syncope.  Preventive Screening-Counseling & Management  Alcohol-Tobacco     Smoking Status: quit     Year Quit: 2000  Current Medications (verified): 1)  Potassium Chloride Cr 10 Meq Cr-Caps (Potassium Chloride) .... Take One Tablet By Mouth Daily 2)  Lisinopril 5 Mg Tabs (Lisinopril) .... Take One Tablet By Mouth Daily 3)  Diltiazem Hcl Er Beads 180 Mg Xr24h-Cap (Diltiazem Hcl Er Beads) .... Take One Capsule By Mouth Daily 4)  Metoprolol Tartrate 100 Mg Tabs (Metoprolol Tartrate) .... Take One Tablet By Mouth Twice A Day 5)  Finasteride 5 Mg Tabs (Finasteride) .... Take 1 Tablet By Mouth Once A Day 6)  Warfarin Sodium 5 Mg Tabs (Warfarin Sodium) .... Use As Directed By Dr. Linna Darner 7)  Miralax  Powd (Polyethylene Glycol 3350) .... Take As Directed  Allergies (verified): No Known Drug Allergies  Comments:  Nurse/Medical Assistant: The patient is currently on medications but does not know the name or dosage at this time. Instructed to contact our office with details. Will update  medication list at that time.  Past History:  Past Medical History: Reviewed history from 11/10/2009 and no changes required. SYSTOLIC HEART FAILURE, CHRONIC (ICD-428.22) HYPERTENSION, UNSPECIFIED (ICD-401.9) ATRIAL FIBRILLATION (ICD-427.31) TACHYCARDIA (ICD-785) 1. Tachycardia - induced cardiomyopathy.     a.     Normalized LVF.     b.     Status post Medtronic ICD implantation. 2. Permanent atrial fibrillation.     a.     Possible TIKOSYN intolerance. 3. Chronic systolic heart failure.     a.     Current NYHA class I heart failure. 4. Chronic Coumadin.     a.     Followed by Dr. Linna Darner. 5. Hypertension.     Social History: Reviewed history from 07/29/2009 and no changes required. Disabled  Single  Tobacco Use - Former.  Alcohol Use - no Smoking Status:  quit  Review of Systems       All systems are reviewed and negative except as listed in the HPI.   Vital Signs:  Patient profile:   55 year old male Height:      77 inches Weight:      288 pounds Pulse rate:   72 / minute BP sitting:   118 / 70  (left arm) Cuff size:   large  Vitals Entered By: Carlye Grippe (Apr 10, 2010 3:20 PM)  Physical Exam  General:  obese, dissheveled Head:  normocephalic and atraumatic Eyes:  PERRLA/EOM intact; conjunctiva and lids normal. Mouth:  very  poor dentition Neck:  supple Chest Wall:  the lateral border of his ICD pocket is indurated and the skin is "thin" over the device header.  Though device dehisence through the skin is possible in the future, the device is not exposed at this time the area is tender Lungs:  Clear bilaterally to auscultation and percussion. Heart:  RRR, no m/r/g Abdomen:  Bowel sounds positive; abdomen soft and non-tender without masses, organomegaly, or hernias noted. No hepatosplenomegaly. Msk:  Back normal, normal gait. Muscle strength and tone normal. Pulses:  pulses normal in all 4 extremities Extremities:  No clubbing or cyanosis. Neurologic:   Alert and oriented x 3. Psych:  Normal affect.    ICD Specifications Following MD:  Hillis Range, MD     Referring MD:  [facility] ICD Vendor:  Medtronic     ICD Model Number:  [id number]     ICD Serial Number:  [id number] ICD DOI:  11/15/2007     ICD Implanting MD:  Sherryl Manges, MD  Lead 1:    Location: RA     DOI: 07/23/1997     Model #: 6940     Serial #: [id number]     Status: active Lead 2:    Location: RV     DOI: 07/23/1997     Model #: 6945     Serial #: [id number]     Status: active Lead 3:    Location: RV     DOI: 07/23/1997     Model #: [id number]     Serial #: [id number]     Status: active  Indications::  NICM   ICD Follow Up Remote Check?  No Battery Voltage:  2.97 V     Charge Time:  9.8 seconds     Underlying rhythm:  AFIB ICD Dependent:  No       ICD Device Measurements Atrium:  Amplitude: 1.2 mV, Impedance: 496 ohms,  Right Ventricle:  Amplitude: 3.7 mV, Impedance: 204 ohms, Threshold: 0.5 V at 0.4 msec Shock Impedance: 45/61 ohms   Episodes MS Episodes:  1     Percent Mode Switch:  100%     Coumadin:  Yes Shock:  0     ATP:  0     Nonsustained:  0     Atrial Pacing:  3.1%     Ventricular Pacing:  74.5%  Brady Parameters Mode DDIR     Lower Rate Limit:  60     Upper Rate Limit 130 PAV 180      Tachy Zones VF:  200     Next Cardiology Appt Due:  06/15/2010 Tech Comments:  Stable RV impedence and sensing.  Pt has area at lateral aspect of can where can is protruding out and skin is discolored.  No breakthrough of skin.  Dr Johney Frame to examine.  Pt also trying to get in with a dental clinic, advised to call [facility] to see if he could get in with their free clinic.  Plan per JA. Gypsy Balsam RN BSN  Apr 10, 2010 3:34 PM  MD Comments:  agree  Impression & Recommendations:  Problem # 1:  IMPLANTATION OF DEFIBRILLATOR, HX OF (ICD-V45.02) Normal ICD function Low RV impedance is stable Given risks of device dehisence through the skin, I think that we should go ahead and revise his  ICD pocket. With poor dentition, he is at very high risk for device infection longterm R/B/A to ICD pocket revision were discussed with pt who wishes to proceed.  We will hold the coumadin for 3 days prior to the procedure as we may have to implant his device into a submuscular pocket As his ICD was implanted for primary prevention, I do not plan to do DFT testing at implant  Problem # 2:  SYSTOLIC HEART FAILURE, CHRONIC (ICD-428.22) Per Dr Graciela Husbands, his EF previously had improved Unfortunately, I do not have a recently documented echo We will obtain an echo  Problem # 3:  ATRIAL FIBRILLATION (ICD-427.31) coumadin longterm  Problem # 4:  HYPERTENSION, UNSPECIFIED (ICD-401.9) stable  Other Orders: Echocardiogram (Echo)  Patient Instructions: 1)  we will schedule device pocket revision 2)  he is encouraged to seek dental care

## 2010-12-15 NOTE — Cardiovascular Report (Signed)
Summary: Office Visit   Office Visit   Imported By: Roderic Ovens 01/05/2010 16:31:51  _____________________________________________________________________  External Attachment:    Type:   Image     Comment:   External Document

## 2010-12-23 NOTE — Cardiovascular Report (Signed)
Summary: Office Visit   Office Visit   Imported By: Roderic Ovens 12/14/2010 16:12:04  _____________________________________________________________________  External Attachment:    Type:   Image     Comment:   External Document

## 2010-12-23 NOTE — Assessment & Plan Note (Signed)
Summary: PACER CHECK PER JAN REMINDER-SRS  Medications Added WARFARIN SODIUM 4 MG TABS (WARFARIN SODIUM) Take as directed by Coumadin clinic PRAVACHOL 40 MG TABS (PRAVASTATIN SODIUM) Take 1 tablet by mouth at bedtime      Allergies Added: NKDA  Current Medications (verified): 1)  Potassium Chloride Cr 10 Meq Cr-Caps (Potassium Chloride) .... Take One Tablet By Mouth Daily 2)  Lisinopril 10 Mg Tabs (Lisinopril) .... Take 1 Tablet By Mouth Once A Day 3)  Diltiazem Hcl Er Beads 180 Mg Xr24h-Cap (Diltiazem Hcl Er Beads) .... Take One Capsule By Mouth Daily 4)  Metoprolol Tartrate 100 Mg Tabs (Metoprolol Tartrate) .... Take One Tablet By Mouth Twice A Day 5)  Finasteride 5 Mg Tabs (Finasteride) .... Take 1 Tablet By Mouth Once A Day 6)  Warfarin Sodium 5 Mg Tabs (Warfarin Sodium) .... Use As Directed By Dr. Linna Darner 7)  Miralax  Powd (Polyethylene Glycol 3350) .... Take As Directed 8)  Warfarin Sodium 2 Mg Tabs (Warfarin Sodium) .... Use As Directed 9)  Vitamin D3 2000 Unit Caps (Cholecalciferol) .... Take 1 Tablet By Mouth Once A Day 10)  Warfarin Sodium 4 Mg Tabs (Warfarin Sodium) .... Take As Directed By Coumadin Clinic 11)  Pravachol 40 Mg Tabs (Pravastatin Sodium) .... Take 1 Tablet By Mouth At Bedtime  Allergies (verified): No Known Drug Allergies    ICD Specifications Following MD:  Hillis Range, MD     Referring MD:  [facility] ICD Vendor:  Medtronic     ICD Model Number:  [id number]     ICD Serial Number:  [id number] ICD DOI:  11/15/2007     ICD Implanting MD:  Sherryl Manges, MD  Lead 1:    Location: RA     DOI: 07/23/1997     Model #: 6940     Serial #: [id number]     Status: active Lead 2:    Location: RV     DOI: 07/23/1997     Model #: 6945     Serial #: [id number]     Status: active Lead 3:    Location: RV     DOI: 07/23/1997     Model #: [id number]     Serial #: [id number]     Status: active  Indications::  NICM   ICD Follow Up Battery Voltage:  2.85 V     Charge Time:  10.0 seconds      Underlying rhythm:  SR ICD Dependent:  No       ICD Device Measurements Atrium:  Amplitude: 0.4 mV, Impedance: 536 ohms,  Right Ventricle:  Amplitude: 6.8 mV, Impedance: 204 ohms, Threshold: 1.0 V at 0.4 msec Shock Impedance: 40/63 ohms   Episodes MS Episodes:  1     Percent Mode Switch:  100%     Coumadin:  Yes Shock:  0     ATP:  0     Nonsustained:  0     Atrial Therapies:  0 Ventricular Pacing:  19.8%  Brady Parameters Mode DDIR     Lower Rate Limit:  50     Upper Rate Limit 130 PAV 300      Tachy Zones VF:  222     Next Cardiology Appt Due:  02/15/2011 Tech Comments:  PT IN AF 100% OF TIME. + WARFARIN.  NORMAL DEVICE FUNCTION.  NO CHANGES MADE. ROV IN 3 MTHS W/DEVICE CLINIC IN [place]. Vella Kohler  December 07, 2010 12:23 PM

## 2011-01-31 LAB — APTT: aPTT: 33 seconds (ref 24–37)

## 2011-01-31 LAB — SURGICAL PCR SCREEN
MRSA, PCR: NEGATIVE
Staphylococcus aureus: POSITIVE — AB

## 2011-01-31 LAB — PROTIME-INR: INR: 1.15 (ref 0.00–1.49)

## 2011-02-22 ENCOUNTER — Other Ambulatory Visit: Payer: Self-pay | Admitting: Cardiology

## 2011-03-26 ENCOUNTER — Ambulatory Visit (INDEPENDENT_AMBULATORY_CARE_PROVIDER_SITE_OTHER): Payer: 59 | Admitting: *Deleted

## 2011-03-26 DIAGNOSIS — I428 Other cardiomyopathies: Secondary | ICD-10-CM

## 2011-03-26 DIAGNOSIS — I4891 Unspecified atrial fibrillation: Secondary | ICD-10-CM

## 2011-03-26 DIAGNOSIS — I5022 Chronic systolic (congestive) heart failure: Secondary | ICD-10-CM

## 2011-03-26 DIAGNOSIS — Z95810 Presence of automatic (implantable) cardiac defibrillator: Secondary | ICD-10-CM

## 2011-03-30 NOTE — Op Note (Signed)
NAME:  [name] [name], [name] [name] NO.:  [id number]   MEDICAL RECORD NO.:  [id number]          PATIENT TYPE:  OIB   LOCATION:  2899                         FACILITY:  [facility]   PHYSICIAN:  Duke Salvia, MD, FACCDATE OF BIRTH:  [birth year]   DATE OF PROCEDURE:  11/15/2007  DATE OF DISCHARGE:  11/15/2007                               OPERATIVE REPORT   PREOPERATIVE DIAGNOSIS:  Previously implanted device, now at end of  life.   POSTOPERATIVE DIAGNOSIS:  Previously implanted device, now at end of  life.   PROCEDURE:  Explantation of a previously implanted device and  implantation of a new device.   Following obtaining informed consent, the patient was brought to the  electrophysiology laboratory and placed on the fluoroscopic table in the  supine position.  After routine prep and drape, lidocaine was  infiltrated along the previous incision and carried down to the layer of  the device pocket using sharp dissection.  The device was explanted.  Interrogation of the previously implanted Medtronic RV lead demonstrated  an R wave of 6.1, impedance of 496, a threshold 0.7 at 0.5.  The  previous RA lead had an impedance of 567.   The patient also had a separate coronary sinus coil left in the proximal  subclavian vein.  These leads were then attached to a Medtronic D154 ATG  ICD, serial #[id number].  Through the device, the bipolar R wave was 8  with impedance of 228, a threshold of 1 volt at 0.3 with right atrial of  impedance of 520.  Proximal coil impedance was 64, distal coil impedance  was 47.   ICD defibrillation threshold testing was then undertaken.  Ventricular  fibrillation was induced via the T-wave shock.  After a total duration  of a number of seconds, the device failed to activate for reasons that  turned out to be related to misprogramming, and the patient was shocked  externally, restoring sinus rhythm.   After a wait of a few minutes, ventricular fibrillation  was induced  again via the T-wave shock.  After a total duration of about 7 seconds,  a 25-joule shock was delivered through a measured resistance of 42 ohms,  terminating ventricular fibrillation and restoring sinus rhythm.  The  device was then implanted, and the pocket was copiously irrigated with  antibiotic containing saline solution.  Hemostasis was assured.  The  leads and the pulse generator were placed back in the pocket and secured  to the prepectoral fascia.  The wound was closed in three layers in the  normal fashion.  The wound was washed, dried, and benzoin and Steri-  Strip dressing was applied.  Needle  counts, sponge counts, and  instrument counts were correct at the end of the procedure according to  staff.   The patient tolerated the procedure without apparent complication.      Duke Salvia, MD, [facility]  Electronically Signed     SCK/MEDQ  D:  01/12/2008  T:  01/14/2008  Job:  [id number]

## 2011-03-30 NOTE — Assessment & Plan Note (Signed)
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  Ejection fraction, however, has now normalized.  He  does have an ICD implant, and he had previously inappropriate ICD shocks  for rapid atrial fibrillation.  He was also initially maintained on  Tikosyn, but he had significant side effects with the drug and also  sinus rhythm was not maintained.  The medication was discontinued.  The  patient now feels quite well.  He is an NYHA class I.  He has no chest  pain, shortness of breath, orthopnea, or PND.  He has no palpitations or  syncope.  Cardiovascular standpoint is quite stable.   EKG in the office today revealed atrial fibrillation with rate control  and occasional ventricular pacing.   MEDICATIONS:  1. Lisinopril 5 mg half a tablet p.o. daily.  2. Diltiazem 180 mg p.o. daily.  3. Metoprolol 100 mg p.o. b.i.d.  4. Potassium 10 mEq p.o. daily.   PHYSICAL EXAMINATION:  VITAL SIGNS:  Blood pressure is 132/82, heart 66,  weights 264 pounds.  NECK:  Normal carotid upstroke and no carotid bruits.  LUNGS:  Clear breath sounds bilaterally.  HEART:  Irregular rate and rhythm with normal S1 and S2.  No murmur,  rubs, or gallops.  ABDOMEN:  Soft, nontender.  No rebound.  No guarding.  Good bowel  sounds.  EXTREMITIES:  No cyanosis, clubbing, or edema.   PROBLEMS:  1. Tachycardia-induced cardiomyopathy.  2. Normalized ejection fraction.  3. Status post implantable cardioverter-defibrillator implant.  4. Permanent atrial fibrillation.  5. Possible TIKOSYN intolerance.  6. New York Heart Association class I.  7. Chronic Coumadin therapy.   PLAN:  1. The patient will continue Coumadin as regulated by Dr. Linna Darner.  2. He has dental work coming up, and I told him to stop his Coumadin 5      days prior to surgery.  He does not need SBE prophylaxis.  3. Also, no direct indication to obtain an echo at this point in time.      We will follow the patient      clinically in 6 months.  If he does develop increased shortness of      breath, an echo would be indicated.     Learta Codding, MD,FACC  Electronically Signed    GED/MedQ  DD: 09/03/2008  DT: 09/03/2008  Job #: [id number]   cc:   Erasmo Downer, MD

## 2011-03-30 NOTE — Discharge Summary (Signed)
NAME:  [name] [name], [name] [name] NO.:  [id number]   MEDICAL RECORD NO.:  [id number]          PATIENT TYPE:  OIB   LOCATION:  2899                         FACILITY:  [facility]   PHYSICIAN:  Duke Salvia, MD, FACCDATE OF BIRTH:  27-Mar-1956   DATE OF ADMISSION:  11/15/2007  DATE OF DISCHARGE:  11/15/2007                               DISCHARGE SUMMARY   ALLERGIES:  NO KNOWN DRUG ALLERGIES.   DICTATION TIME:  Greater than 30 minutes.   FINAL DIAGNOSES:  1. Discharging after generator change out with implantation of a      Medtronic EnTrust dual-chamber cardioverter-defibrillator, Dr.      Sherryl Manges.  2. Patient in atrial fibrillation, controlled ventricular rate on      presentation for procedure November 15, 2007.   SECONDARY DIAGNOSES:  1. 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  This is the third cardioverter-      defibrillator for this patient.  2. Atrial fibrillation, chronic Coumadin.  The patient was in sinus      rhythm on Tikosyn 500 mcg twice daily when seen in the office      November 02, 2007.  3. Hypertension.  4. Dyslipidemia.   PROCEDURE:  Generator change out with implantation of a Medtronic  EnTrust dual-chamber cardioverter defibrillator, Dr. Sherryl Manges.  The  patient had no postprocedural complications.  No hematoma.  Very little  pain at the incision site.   BRIEF HISTORY:  [name] [name] is a 55 year old male.  He has a history of  atrial fibrillation.  He is on chronic Coumadin.  He has a history of  cardiac arrest requiring cardioversion in 1998.  He subsequently  underwent implantation of cardioverter defibrillator at that time.  His  cardiac workup also included catheterization.  The study showed normal  coronary anatomy, but ejection fraction 18%.  At that time, it was  thought that a combination of ethanol abuse and tachycardia  mediated  cardiomyopathy may explain the markedly decreased ejection fraction.  The patient has had a generator change for his Medtronic device in 2002,  and now his cardioverter is beeping at 9 o'clock daily.  Interrogation  shows the device is at elective replacement indicator   With regard to his atrial fibrillation, the patient was hospitalized  June 2008 with multiple cardioverter-defibrillator firings secondary to  rapid ventricular rates from atrial fibrillation.  He was admitted to  [facility] for Tikosyn therapy.  At that time,  electrocardiogram showed that his atrial fibrillation was paroxysmal  with short flurries.  He was started on Tikosyn and completed six dose  is without undue increase in QT interval.  An echocardiogram done at  that hospitalization in June showed ejection fraction had risen to 50-  55%.   The patient presents for device change out.  He says his activity level  is normal.  He does not get short of breath with any ambulation.  He is  not having recurrent or present chest pain.  He  says he is taking his  medication, Tikosyn 500 mcg, seven in the morning and seven at night as  instructed.  Electrocardiogram at the office visit November 02, 2007,  shows sinus rhythm with a rate of 65, PR interval 208, QRS 90  milliseconds.  The patient's mass past medical history also significant  for hypertension and dyslipidemia.   HOSPITAL COURSE:  The patient presents electively for generator change  out November 15, 2007.  Palpation of his pulse shows that it is  irregular and a 12-lead electrocardiogram shows that he is in atrial  fibrillation, somewhat coarse and flutter, heart rate is about 72 beats  a minute, QRS duration is 88 milliseconds.  The patient underwent  generator change out without complication.  He had a defibrillator  threshold study which caused the patient to convert to sinus rhythm.  Apparently the patient's heart rate was very slow, 52,  and the patient  was burst paced back into atrial fibrillation.  At the time of  discharge, pulse palpitation shows that the pulse is irregular, although  not rapid.   The patient is instructed to keep his incision dry for the next seven  days, to sponge bathe in the sink until Wednesday, November 22, 2007.  He  is to remove the bandage on the morning of November 16, 2007, and leave  the incision open to air.  He is to restart his Coumadin which has been  on hold since November 12, 2007.   DISCHARGE MEDICATIONS:  1. Tikosyn 500 mcg twice daily.  2. Lisinopril 5 mg one half tablet daily.  3. Coumadin 7.5 mg daily.  4. Metoprolol 50 mg twice daily.  5. Potassium chloride 10 mEq daily.  6. Polyethylene glycol one capful daily.  7. For pain, Darvocet N 100 one or two tablets every 4 hours as needed      for pain.   FOLLOW UP:  1. He follows up with the [facility], [address], [place], [place], Thursday, November 23, 2007, at 11:30.  2. He will see Dr. Andee Lineman, [facility] on Friday, November 24, 2007, at 10 o'clock in the [place] office.  The question is, with this      patient's conversion back to atrial fibrillation, do we still need      to apply rigorous Tikosyn therapy.  If the patient truly has      paroxysmal atrial fibrillation, maybe a cardiac monitor would      showed this.  Otherwise, the patient's Tikosyn could possibly be      stopped and rate control could be the next step.   LABORATORY STUDIES:  From this admission, his complete blood count was  white cells 5.1, hemoglobin 16, hematocrit 47.1, platelets 208.  Protime  was 15.4, INR 1.2.  Coumadin has been on hold for three days.  Sodium  136, potassium 3.9, chloride 102, carbonate 25, glucose 95, BUN is 11,  creatinine 1.02.      Maple Mirza, [place]      Duke Salvia, MD, [facility]  Electronically Signed    GM/MEDQ  D:  11/15/2007  T:  11/15/2007   Job:  [id number]   cc:   Erasmo Downer, MD  Learta Codding, MD,FACC

## 2011-03-30 NOTE — Assessment & Plan Note (Signed)
[facility]                          [facility] NOTE   NAME:Decker, RAMEL                      MRN:          [id number]  DATE:11/24/2007                            DOB:          1956-04-06    REFERRING PHYSICIAN:  Erasmo Downer, MD   CARDIOLOGIST:  Dr. Andee Lineman and Dr. Graciela Husbands.   HISTORY OF PRESENT ILLNESS:  The patient is a 55 year old African  American male with a history of paroxysmal atrial fibrillation and  tachycardia and induced cardiomyopathy.  Recently, the patient had a  normal ejection fraction.  He was last hospitalized at the end of  December under the service of Dr. Graciela Husbands for generator change out of his  pacemaker defibrillator.  This was uneventful.  The patient, however,  during admission was found to be in atrial fibrillation.  During  defibrillator threshold testing, the patient was shocked into normal  sinus rhythm and due to concerns of thromboembolic embolism.  As the  patient was off Coumadin, the patient was paced back into atrial  fibrillation.  He has now been maintained in atrial fibrillation,  although, his EKG demonstrates an atypical atrial flutter.  He has  remained on Coumadin as well as Tikosyn.  However, he reports symptoms  of nausea, sweats and chills.  Possibly related to his Tikosyn.  On  defibrillator interrogation, also the patient was found to have very  high atrial rates consistent with uncontrolled atrial fibrillation.  He  denies, however, shortness of breath, orthopnea, PND.  Has no  palpitations or syncope.   CURRENT MEDICATIONS:  1. Metoprolol 50 mg p.o. b.i.d.  2. Lisinopril 2.5 mg p.o. daily.  3. Potassium chloride 10 mg p.o. daily.  4. Tikosyn 500 mcg p.o. b.i.d.  5. Coumadin as directed.  6. Lisinopril 5 mg half tablet p.o. daily.   PHYSICAL EXAMINATION:  VITAL SIGNS:  Blood pressure 125/82, heart 78  beats per minute, weight 272 pounds.  NECK:  Reveals normal carotid upstroke and  no carotid bruits.  LUNGS:  Clear breath sounds bilaterally.  HEART:  Regular rate and rhythm with normal S1-S2.  No murmur, rubs or  gallops.  HEART:  Irregular rate and rhythm.  Normal S1, S2.  No murmur, rubs or  gallops.  ABDOMEN:  Soft, nontender, no rebound or guarding.  Good bowel sounds.  EXTREMITY:  No cyanosis, clubbing or edema.   PROBLEM LIST:  1. Tachycardia induced cardiomyopathy with normalize ST ejection      fraction.  2. Status post ICD implant with recent change out (3rd change out).  3. Paroxysmal atrial fibrillation with high atrial rates.  4. Possible Tikosyn intolerance.  5. NYJ Class I.   PLAN:  1. The patient appears to have side effects to Tikosyn and sinus      rhythm is not maintained.  We will discontinue Tikosyn and increase      metoprolol to 100 mg p.o. b.i.d. to control the patient's high      atrial rates.  2. The patient is essentially asymptomatic in atrial flutter/atrial      fibrillation and given the side  effects of Tikosyn, we will up for      a rates strategy only.  3. The patient's PT/INR will be checked.  4. The patient will follow up with [place] next couple of months.     Learta Codding, MD,FACC  Electronically Signed    GED/MedQ  DD: 11/26/2007  DT: 11/27/2007  Job #: [id number]   cc:   Erasmo Downer, MD  Duke Salvia, MD, [facility]

## 2011-03-30 NOTE — Cardiovascular Report (Signed)
[facility]                   [facility]   NAME:[name] [name], [name] [name]                      MRN:          [id number]  DATE:05/31/2008                            DOB:          May 12, 1956    [name] [name] is status post ICD implantation for primary prevention in  the setting of nonischemic cardiomyopathy, which has subsequently been  largely improved with his most recent ejection fraction of 55%.  He has  now atrial fibrillation that is permanent and ended up with  inappropriate ICD shocks.  His Tikosyn was discontinued and he was put  on Cardizem and his heart rate has been much better controlled.  A  monitoring zone of his device at 1:30 identifies no episodes.   MEDICATIONS:  1. Lisinopril at 2.5.  2. Diltiazem 180.  3. Metoprolol 100 b.i.d.  4. Coumadin.  5. Potassium.   On examination, his blood pressure is mildly elevated today at 143/92.  His lungs were clear.  His heart sounds were regular.  There is an S4  and early systolic murmur.  The abdomen was soft.  The extremities had  no edema.   Interrogation of Medtronic EnTrust ICD demonstrates atrial fibrillation  with P-wave of 0.7, the R-wave of 4.7, the impedance of 204, the  threshold of 1 volt at 0.4.  Battery voltage was 3.16.   IMPRESSION:  1. Nonischemic cardiomyopathy.  2. Status post implantable cardioverter-defibrillator for nonischemic      cardiomyopathy.  3. Inappropriate shocks for atrial fibrillation.  4. Borderline hypertension.   [name] [name] is stable.  We will plan to see him again in three months'  time in the Device Clinic.      Duke Salvia, MD, [facility]  Electronically Signed    SCK/MedQ  DD: 05/31/2008  DT: 06/01/2008  Job #: [id number]

## 2011-03-30 NOTE — Assessment & Plan Note (Signed)
[facility]                         ELECTROPHYSIOLOGY OFFICE NOTE   NAME:[name] [name], [name] [name]                      MRN:          [id number]  DATE:11/02/2007                            DOB:          1956-02-20    HISTORY & PHYSICAL   This is electrophysiology office History & Physical.   The patient has no known drug allergies.   CARDIOLOGIST:  Dr. Andee Lineman.   ELECTROPHYSIOLOGIST:  Dr. Sherryl Manges.   PRESENTING CIRCUMSTANCE:  I am here because my device is beeping every  morning at 9 a.m.   HISTORY OF PRESENT ILLNESS:  [name] [name] is a 55 year old male.  He has  a history of atrial fibrillation.  He is on chronic Coumadin.  Going  further back, he has a history of cardiac arrest requiring cardioversion  in 1998.  He subsequently underwent implantation of a cardioverter  defibrillator at that time.  His cardiac workup also included  catheterization which showed normal coronary anatomy but an ejection  fraction of 18%.  At the time, it was thought that a combination of  ethanol abuse and tachycardia-mediated cardiomyopathy may explain his  markedly decreased ejection fraction.  The patient has had a generator  change for his Medtronic device in 2002 and now his cardioverter  defibrillator is beeping at 9 to 10 a.m. daily.  Interrogation shows  that the device is at elective replacement indicator.   With regard to his atrial fibrillation, the patient was hospitalized in  June, 2008, after multiple cardioverter defibrillator firings secondary  to atrial fibrillation, rapid ventricular rates.  He was admitted to  [facility] for Tikosyn therapy.  At that time electrocardiograms showed  that his atrial fibrillation was paroxysmal with short flurries.  He was  started on Tikosyn and completed 6 doses without undue increase in his  QT interval.  An echocardiogram was done at that hospitalization showing  rebound in his ejection fraction to 50-55%.   The  patient presents today wondering what we will do for his device.  He  says his activity level is at normal.  He does not get short of breath  with any of his ambulations, he is not having recurrent or present chest  pain.  He says he is taking his medication Tikosyn 500 mcg at 7 in the  morning and 7 at night as instructed.  Electrocardiogram shows that he  is in sinus rhythm with a rate of 65, the PR interval is 208, the QRS is  90 milliseconds.  The patient's past medical history is also significant  for hypertension, dyslipidemia as well as his paroxysmal atrial  fibrillation.   MEDICATIONS:  1. Tikosyn 500 mcg b.i.d.  2. Lisinopril 5 mg, 1/2 tab daily.  3. Coumadin 7.5 mg daily.  4. Metoprolol 50 mg twice daily.  5. Potassium chloride 10 mEq daily.  6. Polyethylene glycol one cap daily.  7. The patient has been on Zocor 40 mg daily, this has been      discontinued.   The patient's weight is 288.  This is fairly subsequently up from his  last office visit, in  June it was 254.  Blood pressure is 122/80, heart  rate is 65.  The patient is alert and oriented x3, in no acute distress,  once again having no trouble breathing.  HEENT:  Eyes, pupils equal, round and reactive to light, extraocular  movements are intact.  The nares are patent. The oropharynx shows no  evidence of lesion or erythema.  His dentition is poor.  NECK:  Supple, no carotid bruits auscultated.  LUNGS:  Clear to auscultation bilaterally.  HEART:  regular rate and rhythm.  ABDOMEN:  Soft, nondistended, bowel sounds are present.  Deeper  structures were not palpated.  The patient was not exhibiting any  clubbing, cyanosis or edema.  NEUROLOGIC:  Grossly intact.   REVIEW OF SYSTEMS:  The patient is not having chest pain, shortness of  breath, paroxysmal nocturnal dyspnea, orthopnea.  He has not experienced  any presyncope or syncope.  He does not give evidence of palpitations or  lower extremity edema.  He  recounts no history of lesions or rashes, no  nonhealing ulcerations to his body.  He is not experiencing epistaxis,  hematemesis, hematochezia.  He does have occasional nocturia.  He is not  complaining of heartburn.  He gives no history of GI bleeding.  He is  not complaining of any myalgias and has no prior history of diabetes or  thyroid disease.   IMPRESSION:  1. Cardioverter defibrillator implanted for nonischemic      cardiomyopathy.  History of sudden cardiac death, is now at      elective replacement indicator.  This will be his third      cardioverter defibrillator.  2. Atrial fibrillation, on chronic Coumadin therapy.  Now quiescent.      In sinus rhythm on Tikosyn 500 micrograms twice daily.  3. hypertension.  4. Dyslipidemia.   PLAN:  The patient will present December 31st at 8:30 to Short Stay C  [facility] for a generator change for this Medtronic device.      Maple Mirza, PA  Electronically Signed      Arturo Morton. Riley Kill, MD, [facility]  Electronically Signed   GM/MedQ  DD: 11/02/2007  DT: 11/03/2007  Job #: [id number]

## 2011-03-30 NOTE — Assessment & Plan Note (Signed)
[facility]                            CARDIOLOGY OFFICE NOTE   NAME:[name] [name], [name] [name]                      MRN:          [id number]  DATE:05/01/2007                            DOB:          05-24-1956    THIS PATIENT HAS NO KNOWN DRUG ALLERGIES.   CARDIOLOGIST:  Dr. Cristopher Estimable, [place].   ELECTROPHYSIOLOGIST:  Dr. Sherryl Manges.   PRESENTING CIRCUMSTANCE:  Dr. Andee Lineman sent me down here.   HISTORY OF PRESENT ILLNESS:  [name] is a 55 year old male.  He has  a history of nonischemic cardiomyopathy and atrial fibrillation.  He  first presented to [facility] in 1998 with congestive heart  failure and experience cardiac arrest requiring DCCV.  Echocardiogram  showed ejection fraction of 18%.  He also was found to be in atrial  fibrillation.  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   Having been cardioverted at [facility], patient was then  transferred to [facility] for implantation of a cardioverter  defibrillator.  He also had electrophysiology study in September of 1998  which showed that his runs of NSVT were noninducible.  He had subsequent  generator change of his cardioverter defibrillator in [place] in 2006.  He was admitted to [facility] in October of 2007 for  inappropriate ICD discharges from a rapid atrial fibrillation.  He was  transferred to [facility] at the time and started on Tikosyn  therapy.  Apparently has done well in the interim.  A recent  echocardiogram shows ejection fraction has rebounded to 55%.  He had a  quite normal followup visit with Dr. Andee Lineman on April 21, 2007.  Electrocardiogram showed that the patient had lapsed into recurrent  atrial fibrillation.  On questioning by Dr. Andee Lineman the patient is not  having chest pain, he is not short of breath with activity, he is not  having presyncope or syncope.  He does not experience lower extremity  edema.  He is not dizzy with standing or moving about.  He was sent down  to the office at [facility] on June 16.  His device  was interrogated, it showed that his atrial fibrillation had started  sometime in late February while the rhythm was rate controlled.  At rest  he had sprints up to 150+ beats per minute with higher levels of  activity.   He was seen by Dr. Graciela Husbands who recommended cardioversion, and this could  be done at the offices in [place], [place].   PHYSICAL EXAMINATION:  The patient weighs 254 pounds today, blood  pressure 132/80, heart rate is 86.  The patient says that sometimes he  feels his heart beating hard and faster than normal, especially when he  is asleep.  The patient is currently taking 7.5 mg of Coumadin daily.  On exam he is alert and oriented x3, somewhat anxious  about whether he  should exercise or not, we have given him a go ahead on that.  LUNGS:  Clear to auscultation bilaterally.  HEART:  Irregular rate and rhythm, but in controlled rates.  There are  no carotid bruits auscultated.  ABDOMEN:  Soft, nondistended. Bowel sounds are present.  Radial pulses are 4/4 bilaterally.  There was no pedal edema, clubbing,  or cyanosis,   MEDICATIONS ON PRESENTATION:  1. Tikosyn 500 mcg every 12 hours.  2. Lisinopril 5 mg 1/2 tab daily.  3. Digoxin 0.25 mg daily.  4. Coumadin 7.5 mg daily.  5. Metoprolol 50 mg twice daily.  6. Simvastatin 40 mg daily bedtime.  7. Potassium chloride 10 mEq daily.   The patient shows some concern concerning erectile dysfunction and asks  if he could be put on these phosphodiesterase-type medications which of  course are all contraindicated in patients taking medications such as  Tikosyn, amiodarone,  Flecainide, or Sotalol.   OTHER MEDICAL PROBLEMS:  1. Hypertension.  2. Dyslipidemia.  3. History NSET.  4. Paroxysmal atrial fibrillation which has now recurrence since      February of this year.   PLAN:  1. The patient will be called from the office of [facility],      [place], [place] to present for cardioversion on Tikosyn therapy.  2. The patient will run out of Tikosyn at the end of July.  He has one      refill left which will carry him through July.  It is hoped that      this can be renewed up at the [facility] office with Dr. Andee Lineman.      Maple Mirza, PA  Electronically Signed      Duke Salvia, MD, [facility]  Electronically Signed   GM/MedQ  DD: 05/01/2007  DT: 05/01/2007  Job #: [id number]

## 2011-03-30 NOTE — Assessment & Plan Note (Signed)
[facility]                          [place] CARDIOLOGY OFFICE NOTE   NAME:Hallums, BRAZOS                      MRN:          [id number]  DATE:03/13/2009                            DOB:          Apr 21, 1956    PRIMARY CARDIOLOGIST:  Learta Codding, MD, [facility]   ELECTROPHYSIOLOGIST:  Duke Salvia, MD, [facility], in [place].   REASON FOR VISIT:  Scheduled 67-month followup.   Mr. Upchurch continues to do well, with no interim development of  signs/symptoms suggestive of decompensated heart failure.  He denies any  significant shortness of breath upon climbing a flight of stairs.  He  denies PND, orthopnea, or significant lower extremity edema.  He denies  any defibrillator shocks.   Mr. Galbraith has gained 18 pounds since his last visit.  However, he  attributes this to the recent addition of vitamin D supplement.   CURRENT MEDICATIONS:  1. Diltiazem 180 daily.  2. Lisinopril 2.5 daily.  3. Finasteride 5 daily.  4. Metoprolol tartrate 100 b.i.d.  5. Coumadin, as directed.  6. Potassium chloride 10 mEq daily.   PHYSICAL EXAMINATION:  VITAL SIGNS:  Blood pressure 142/92; pulse 78,  regular; and weight 282 (up 18).  GENERAL:  A 55 year old male sitting upright in no distress.  HEENT:  Normocephalic, atraumatic.  NECK:  Palpable carotid pulse without bruits; no JVD at 90 degrees.  LUNGS:  Clear to auscultation bilaterally.  HEART:  Regular rate and rhythm.  No significant murmurs.  No rubs.  ABDOMEN:  Soft, intact bowel sounds.  EXTREMITIES:  Trace pedal edema.  NEURO:  No focal deficit.   IMPRESSION:  1. Tachycardia - induced cardiomyopathy.      a.     Normalized LVF.      b.     Status post Medtronic ICD implantation.  2. Permanent atrial fibrillation.      a.     Possible TIKOSYN intolerance.  3. Chronic systolic heart failure.      a.     Current NYHA class I heart failure.  4. Chronic Coumadin.      a.     Followed by Dr. Linna Darner.  5.  Hypertension.   PLAN:  1. Increase lisinopril to 2.5 mg b.i.d., for more aggressive blood      pressure control.  2. Followup BMET for monitoring of potassium and renal function.  3. The patient was instructed to increase his exercise regimen, so as      to lose weight and help regulate his blood pressure.  4. Return clinic follow up with myself and Dr. Andee Lineman in 6 months.      Gene Serpe, PA-C  Electronically Signed      Learta Codding, MD,FACC  Electronically Signed   GS/MedQ  DD: 03/13/2009  DT: 03/14/2009  Job #: [id number]   cc:   Erasmo Downer, MD

## 2011-03-30 NOTE — Cardiovascular Report (Signed)
[facility]                   [place] ELECTROPHYSIOLOGY DEVICE CLINIC NOTE   NAME:Rapley, BOEN                      MRN:          [id number]  DATE:08/28/2007                            DOB:          October 02, 1956    HISTORY:  Mr. Spraggins is status post a cardioversion.  He is holding  sinus rhythm.  He has had recovery of his left ventricular systolic  function by the last assessment.   MEDICATIONS:  1. Metoprolol.  2. Digoxin.  3. Lisinopril.  4. Tikosyn 500 mcg twice daily.  5. Coumadin.  6. Simvastatin.   NOTATION:  I should note that the patient is also complaining of diffuse  muscle aches.   PHYSICAL EXAMINATION:  VITAL SIGNS:  Blood pressure 128/81, pulse 75.  LUNGS:  Clear.  HEART:  Sounds were regular.  ABDOMEN:  Soft, normal bowel sounds.  EXTREMITIES:  Without edema.  SKIN:  Warm and dry.   Interrogation of his device demonstrated normal device function.   IMPRESSION:  1. Tachycardia-induced cardiomyopathy.  2. Status post implantable cardioverter defibrillator in the context      of the above.  3. Normalization of left ventricular function.  4. Paroxysmal atrial fibrillation, status post recent cardioversion.  5. Tikosyn for the above.  6. Myalgias, questionably related to his Simvastatin.   PLAN:  Mr. Landgrebe is doing pretty well.  I have suggested that he  discontinue his Simvastatin and see he feels; that is, whether his  myalgias resolve.  The other thing is that given his non-normal left  ventricular function, I have suggested that he discontinue his digoxin.   FOLLOWUP:  I will see him again in 12 months' time.     Duke Salvia, MD, [facility]  Electronically Signed   SCK/MedQ  DD: 08/28/2007  DT: 08/29/2007  Job #: [id number]

## 2011-03-30 NOTE — Assessment & Plan Note (Signed)
[facility]                          [place] CARDIOLOGY OFFICE NOTE   NAME:[name] [name], [name] [name]                      MRN:          [id number]  DATE:07/24/2007                            DOB:          [birth year]    HISTORY OF PRESENT ILLNESS:  The patient is a 55 year old African  American male with a history of non-ischemic cardiomyopathy. The patient  is status post recent cardioversion for atrial fibrillation. This was  performed on 06/08/07. The patient now remains in normal sinus rhythm  and has actually been clinically been doing quite well. He reports no  chest pain, palpitations, syncope or orthopnea or PND. The patient  informs me that he has possibly a dental abscess and needs to have a  tooth extraction performed. He questions me whether he can discontinue  Coumadin for this procedure. The patient normally has his PT/INR  followed by Dr.  Linna Darner.   MEDICATIONS:  1. Metoprolol 50 mg p.o. b.i.d.  2. Digoxin 0.25 mg p.o. daily.  3. Lisinopril 2.5 mg p.o. daily.  4. Zocor 40 mg p.o. nightly.  5. Potassium 10 mEq p.o. daily.  6. Tikosyn 500 mcg p.o. b.i.d.  7. Coumadin as directed.   PHYSICAL EXAMINATION:  VITAL SIGNS: Blood pressure 129/78, heart rate  65, weight 261 pounds.  NECK: Normal carotid upstroke. No carotid bruits.  LUNGS:  Clear breath sounds bilaterally.  HEART: Regular rate and rhythm. Normal S1, S2. No murmur, rubs or  gallops.  ABDOMEN: Soft.  EXTREMITIES: No cyanosis, clubbing or edema.   Twelve lead electrocardiogram: Normal sinus rhythm, QTC is standard at  88 milliseconds, nonspecific ST segment changes.   PROBLEM LIST:  1. Paroxysmal atrial fibrillation.      a.     Status post cardioversion 06/08/07.      b.     Maintenance of normal sinus rhythm.  2. Status post implantable cardioverter defibrillator discharge      secondary to recurrent atrial fibrillation.      a.     Status post __________ therapy.      b.     Rate  control with Toprol and digoxin.  3. Non-ischemic cardiomyopathy.      a.     Ejection fraction of 55%.      b.     Apical wall motion abnormality.  4. History of non-sustained ventricular tachycardia (negative EP      study).  5. History of sudden death in 1997-03-29 requiring implantable cardioverter      defibrillator  implant.  6. Previous alcohol and speed use.  7. Coumadin anticoagulation.   PLAN:  1. I told the patient that he can proceed with a dental extraction in      the next couple of weeks if needed. He is now greater than 4 weeks      after cardioversion and has remained on Coumadin anti-coagulation.      Coumadin can be discontinued for an extended period of time if      needed for this procedure.  2. From a cardiovascular perspective, the patient is stable and he  remains in normal sinus rhythm. Continue on Tikosyn therapy.  3. We refilled the patient's simvastatin today.  4. The patient will have a PT/INR done in the office today. The      patient can followup with [place] in six months.     Learta Codding, MD,FACC  Electronically Signed    GED/MedQ  DD: 07/24/2007  DT: 07/24/2007  Job #: [id number]   cc:   Erasmo Downer, MD

## 2011-03-30 NOTE — Assessment & Plan Note (Signed)
[facility]                          [place] CARDIOLOGY OFFICE NOTE   NAME:[name], [name]                      MRN:          [id number]  DATE:04/21/2007                            DOB:          June 30, 1956    HISTORY OF PRESENT ILLNESS:  Patient is a 55 year old African American  male with a history of nonischemic cardiomyopathy.  The patient's most  recent ejection fraction, however, has normalized, and is 55%.  We saw  the patient previously for inappropriate ICD firing secondary to atrial  fibrillation.  The patient was transferred to [facility] for initiation  of Tacozin therapy.  On discharge, he was in normal sinus rhythm.  The  patient now presents for followup.  He states that overall he has been  doing quite well.  He reports no recurrent palpitations, although he  still feels occasional skipping of his heartbeat.  He denies any  substernal chest pain.  He has no shortness of breath.  He states that  he has had no ICD firings.   Today in the office, however, the patient did demonstrate he is back in  atrial fibrillation, but with rate control.  The patient denies any  substernal chest pain, shortness of breath.  He denies any nausea or  vomiting.   REVIEW OF SYSTEMS:  Otherwise, normal.   MEDICATIONS:  1. Metoprolol 50 mg p.o. b.i.d.  2. Digoxin 0.25 daily.  3. Lisinopril 2.5 daily.  4. Zocor 40 mg nightly.  5. Potassium chloride 10 mg p.o. daily.  6. __________ 500 mcg p.o. b.i.d.  7. Coumadin as directed.   PHYSICAL EXAMINATION:  VITAL SIGNS:  Blood pressure is 139/84.  Heart  rate is 62 beats per minute.  Weight is 254 pounds.  NECK EXAM:  Normal carotid upstroke.  No carotid bruits.  LUNGS:  Clear.  HEART:  Irregular rate and rhythm.  Normal S1 and S2 with occasional  extrasystole.  ABDOMEN:  Soft.  EXTREMITY EXAM:  No edema.   PROBLEM LIST:  1. Status post ICD discharge secondary to recurrent atrial      fibrillation.  a.     Status post Tacozin.      b.     Rate control with Toprol and digoxin, now with recurrent       atrial fibrillation.  2. Nonischemic cardiomyopathy, ejection fraction 55% with significant      apical wall motion abnormality.  3. History of nonsustained ventricular tachycardia.  Negative EP      study.  4. History of sudden cardiac death in Apr 24, 1997 requiring ICD implant.  5. Previous alcohol use and speed use.  6. Coumadin anticoagulation.   PLAN:  1. The patient appears to be back in atrial fibrillation.  It is not      clear whether he has paroxysmal atrial fibrillation or just      permanent atrial fibrillation.  We will set him up for ICD      interrogation next week.  2. I have placed a call to Dr. Graciela Husbands, and notified him that if the      patient has  permanent atrial fibrillation, we probably should      consider cardioverting him on Tacozin therapy.  I will discuss this      further him after he calls me back.  3. The patient will follow up with [place] in the next few weeks for      definitive plan.     Learta Codding, MD,FACC  Electronically Signed    GED/MedQ  DD: 04/21/2007  DT: 04/21/2007  Job #: [id number]   cc:   Erasmo Downer, MD

## 2011-04-02 NOTE — Assessment & Plan Note (Signed)
[facility]                            [place] CARDIOLOGY OFFICE NOTE   NAME:[name] [name], [name] [name]                      MRN:          [id number]  DATE:09/21/2006                            DOB:          [birth year]    REFERRING PHYSICIAN:  Linna Darner, M.D.   HISTORY OF PRESENT ILLNESS:  The patient is a 55 year old African-American  male with a history of nonischemic cardiomyopathy admitted 1 month ago with  inappropriate ICD firings.  The patient developed atrial fibrillation and I  received several counter-shocks.  He was started on metoprolol and digoxin.  One month later, however, the patient had another discharge and was brought  back to the [facility] Emergency Room.  He was then transferred to [facility] where dofetilide therapy was initiated and additional adjustments  were made in his defibrillator settings.   The patient now presents for followup.  He has been doing well.  He has  known coronary artery disease and his cardiomyopathy has felt to be  nonischemic secondary to prior history of alcohol use and atrial  fibrillation.  Most recent echocardiographic study actually demonstrates an  ejection fraction that is in the normal range albeit with an apical wall  motion abnormality as interpreted by Dr. Myrtis Ser.  The patient said he has no  chest pain or shortness of breath.  He appears to have no side effects to  dofetilide therapy.   MEDICATIONS:  1. Metoprolol 50 mg p.o. b.i.d.  2. Digoxin 0.25 mg q.day.  3. Lisinopril 2.5 mg p.o. q.day.  4. Zocor 40 mg p.o. q.day.  5. Potassium 10 mEq q.day.  6. Tikosyn 500 mcg p.o. b.i.d.  7. Coumadin as directed by Dr. Linna Darner.   PHYSICAL EXAMINATION:  VITAL SIGNS:  Blood pressure 118/72.  Heart rate 64.  NECK EXAM:  Normal carotid upstroke and no carotid bruits.  LUNGS:  Clear.  HEART:  Regular rate and rhythm.  Normal S1, S2.  No murmurs, rubs, or  gallops.  ABDOMEN:  Soft.  EXTREMITY EXAM:  No  edema.   PROBLEM LIST:  1. Status post ICD discharge secondary to recurrent atrial fibrillation.      a.     Status post Tikosyn initiation therapy.      b.     Rate controlled with Toprol and Digoxin.  2. Nonischemic cardiomyopathy, ejection fraction 55% but with significant      apical wall motion abnormality.  3. History of nonsustained ventricular tachycardia and negative EP study.  4. History of some suddencardiac death in 1997/04/07 requiring ICD implant.  5. History of previous alcohol use and speed use.  6. Coumadin anticoagulation.   PLAN:  1. The patient has been doing well.  I have given him an additional      prescription for Tikosyn as he is almost out of it.  He is awaiting      delivery from the drug company.  2. The patient can follow up with [place] in six months.  He is stable from a      hemodynamic perspective.  Ejection fraction within normal limits.  He      does have an apical motion wall abnormality.  3. No further changes in his medication regimen were done.  The patient      will follow up with Dr. Linna Darner regarding his Coumadin monitoring.     Learta Codding, MD,FACC  Electronically Signed    GED/MedQ  DD: 09/21/2006  DT: 09/21/2006  Job #: [id number]

## 2011-04-02 NOTE — Discharge Summary (Signed)
NAME:  SHIA, EBER NO.:  [id number]   MEDICAL RECORD NO.:  [id number]          PATIENT TYPE:  INP   LOCATION:  2014                         FACILITY:  [facility]   PHYSICIAN:  Maple Mirza, PA   DATE OF BIRTH:  Mar 06, 1956   DATE OF ADMISSION:  09/13/2006  DATE OF DISCHARGE:  09/16/2006                                 DISCHARGE SUMMARY   ADDENDUM TO DISCHARGE SUMMARY   DICTATION:  Just prior to the patient's discharge on September 16, 2006, a  programming change for his defibrillator was made.  The VF detection rate  was changed from 188 beats/minute to 222 beats/minute.  No other changes  made.  This is to avoid further oversensing in case the patient's atrial  fibrillation returns at a rapid rate.      Maple Mirza, PA     GM/MEDQ  D:  09/16/2006  T:  09/18/2006  Job:  [id number]   cc:   Duke Salvia, MD, [facility]  Learta Codding, MD,FACC

## 2011-04-02 NOTE — H&P (Signed)
NAME:  [name] [name], [name] [name] NO.:  [id number]   MEDICAL RECORD NO.:  [id number]          PATIENT TYPE:  INP   LOCATION:  2014                         FACILITY:  [facility]   PHYSICIAN:  Maple Mirza, PA   DATE OF BIRTH:  [birth year]   DATE OF ADMISSION:  09/13/2006  DATE OF DISCHARGE:                                HISTORY & PHYSICAL   ELECTROPHYSIOLOGIST:  Sherryl Manges, MD.   CARDIOLOGIST PRIMARY:  Dr. Andee Lineman.   PRIMARY PHYSICIAN:  Dr. Erasmo Downer   ALLERGIES:  The patient has no known drug allergies.   HISTORY OF PRESENT ILLNESS:  Mr.  [name] [name] is a 55 year old male who has a  history of in-hospital cardiac arrest for which he was emergently  cardioverted and then Heli-lifted to [facility] where he received a  cardioverter defibrillator in 1998.  He has had a generator change in  [place], [place] in 2006.  It is a Medtronic device.  He originally  presented October 4 with defibrillator firings multiple times which were  related to the device inappropriately assessing atrial fibrillation and  rapid ventricular rate as a ventricular event.  He was kept overnight.  His  beta blocker was increased and then discharged.  The patient does have a  history of atrial fibrillation and is on chronic Coumadin.  He was admitted  to [facility] once again on October 29 with the same complaint of multiple  ICD shocks.  He has no prodromal chest pain.  He feels his heart racing but  no nausea, diaphoresis, or pre syncope.  Up at [facility], the main  concentration was on rate control.  He was maintained on Lopressor 50 mg  b.i.d., started on digoxin 0.25 mg daily.  The device was interrogated on  October 29 which confirmed that the atrial fibrillation was the cause of the  multiple discharges; although some discharges appear to induce ventricular  tachycardia.  There was also the comment that the ventricular pacing lead  impedances appear to be trending down.  The patient's  atrial fibrillation  has been stabilized on these rate-controlling medications, and in fact, the  patient is in sinus rhythm with only bursts of atrial fibrillation.  A 2D  echocardiogram was done October 30 that showed ejection fraction 50 to 55%.  The patient transferred October 30 to [facility] for initiation of  Tikosyn therapy.  Of note, his admission QTC was about 380 milliseconds.  His potassium on admission was 4.3.  His magnesium was 1.8, and this was  replenished.  His creatinine was 1.0.  He started on 500 mcg Tikosyn to be  given at 9:00 p.m. October 30.   PAST MEDICAL HISTORY:  The patient's past medical history includes, in  addition to the history of probable nonischemic cardiomyopathy with in-  hospital ventricular fibrillation arrest requiring DCCV and implantation of  ICD at that time in 1998.  The patient also has a history of nonsustained  ventricular tachycardia, hypertension, and dyslipidemia.   MEDICATIONS:  Medications for this patient include:  1. Metoprolol 50 mg b.i.d.  2. Lisinopril 2.5 mg daily.  3.  Digoxin 0.25 mg daily.  4. Potassium chloride 40 mEq twice daily.  5. Either simvastatin or Zocor 20 mg daily.  6. Coumadin 7.5 mg daily.   SOCIAL HISTORY:  The patient lives in [place], [place] with his [name] [name] and  two brothers.  He does not smoke.  He does not partake of alcoholic  beverages or recreational drugs.  He is disabled since the 1998 hospital  arrest.   FAMILY HISTORY:  [name] [name] died of complications of gallbladder surgery.  She  had diabetes.  [name] [name] is living but does not seek medical attention and  seems to be doing okay.  He has brothers who have no history of coronary  disease but have hypertension.   REVIEW OF SYSTEMS:  The patient is not having fevers or chills.  He has lost  about 20 pounds through the last year and is working to lower his weight  even further.  His weight has gone down from 290 to 272.  This is his  current  weight 272.  HEENT:  No complaint of epistaxis, photophobia, or  vertigo.  Nasal discharge.  INTEGUMENT:  No rashes or nonhealing ulcerations  to the lower extremities.  CARDIOPULMONARY:  No chest pain, no dyspnea on  exertion, no orthopnea, no orthopnea, no paroxysmal nocturnal dyspnea.  Some  feelings of pre syncope when the heart rate is elevated when his atrial  fibrillation is in rapid ventricular rate in paroxysm.  No lower extremity  edema, no wheezing, and no evident feeling of palpitations.  UROGENITAL:  No  urinary complaints.  GI:  No reflux, no history of GI bleeding.  MUSCULOSKELETAL:  No evidence of degenerative joint disease, no pain in the  hips, knees, shoulders, or fingers.  NEUROLOGIC:  No basically no deficits  to speak of.  All other systems are intact.   PHYSICAL EXAMINATION:  Temperature 97.7, blood pressure 110/67, pulse is 50,  respirations are 20, oxygen saturation 96% on room air.   LABORATORY STUDIES:  Potassium 4.3, magnesium 1.8 - this will be  replenished, creatinine 1.0.  The patient will be started on 500 mcg of  Tikosyn twice a day with electrocardiograms two hours after each dose.  Baseline QTC is 380.      Maple Mirza, PA     GM/MEDQ  D:  09/14/2006  T:  09/14/2006  Job:  [id number]

## 2011-04-02 NOTE — Discharge Summary (Signed)
NAME:  [name] [name], [name] [name] NO.:  [id number]   MEDICAL RECORD NO.:  [id number]          PATIENT TYPE:  INP   LOCATION:  2014                         FACILITY:  [facility]   PHYSICIAN:  Maple Mirza, PA   DATE OF BIRTH:  1956/11/10   DATE OF ADMISSION:  09/13/2006  DATE OF DISCHARGE:  09/16/2006                                 DISCHARGE SUMMARY   This patient has no known drug allergies.   PRINCIPAL DIAGNOSES:  1. Admitted to [facility] with ICD discharges.      a.     Defibrillator sensing atrial fibrillation with rapid ventricular       rate causing discharge which in one case initiated ventricular       fibrillation which subsequently discharged to sinus rhythm.      b.     Rate control, metoprolol/digoxin.      c.     Rhythm control with Tikosyn.  Patient completed his sixth dose       on the morning of discharge 11/02.      d.     QTC not prolonged after five doses.      e.     Patient maintaining sinus rhythm with very intermittent flashes       of atrial fib.  No further ICD discharges on this medical regimen.   SECONDARY DIAGNOSES:  1. History of cardiac arrest requiring DC cardioversion in [place] in      1988.      a.     Catheterization 07/18/1997 at [facility].  Normal coronary anatomy.       Ejection fraction 18%.      b.     Nonischemic cardiomyopathy thought at that time secondary to       ethanol/atrial fibrillation.  2. Echocardiogram at [facility] 09/13/2006 with an ejection      fraction of 50-55%.  3. Status post cardioverter defibrillator implantation 07/1997 for a      cardiac arrest which occurred at [facility].  Defibrillator was      implanted at [facility].      a.     Electrophysiology study 07/19/1997 with no inducible ventricular       tachycardia.  4. Nonsustained ventricular tachycardia.  5. Hypertension  6. Dyslipidemia.  7. Chronic Coumadin therapy.   PRIMARY CARE PHYSICIAN:  Dr. Erasmo Downer.   BRIEF  HISTORY:  [name] is a 55 year old male who has a history of in  hospital cardiac arrest for which he was emergently cardioverted and then  heli lifted to [facility] where he received a cardioverter  defibrillator in 1998.  He has had a generator change in [place], [place]  in 2006.  It is a Medtronic device.   He originally presented 10/04 with defibrillator firings multiple times  which were related to the device inappropriately assessing atrial  fibrillation at a rapid ventricular rate.  It thought of them as ventricular  events.  He was kept over night.  His beta blocker was increased and the  patient was discharged.   The patient does have a history  of atrial fibrillation and he is on chronic  Coumadin.  He was admitted to [facility] once again on 10/29 with the  same complaint of multiple ICD shocks.  He has no prodromal chest pain.  He  feels his heart racing, but he has no nausea, diaphoresis, or presyncope.  At [facility], the main concentration has been on rate control.  He was  maintained on Lopressor 50 mg b.i.d. and started on digoxin 0.25 mg daily.  The device was interrogated 10/29.  This confirmed that atrial fibrillation  was the cause of multiple discharges, although one discharge at least  induced ventricular fibrillation.  There was also the comment that the  ventricular pacing lead impedances appear to be trending down.   The patient's atrial fibrillation has been stabilized on rate control  medications, and in fact, on presentation here at [facility], the patient is  in sinus rhythm with intermittent short bursts of atrial fibrillation.  A 2D  echocardiogram was done 10/30.  The study showed ejection fraction of 50-  55%.  The patient was transferred 10/30 to [facility] for  initiation of Tikosyn therapy.  Of note, his admission QTC was about 380  milliseconds.  Potassium on admission 4.3, magnesium 1.8.  Magnesium has  been replenished.   His creatinine is 1 and he will be started on 500 mcg  Tikosyn to be given at 9:00 10/30 and to continue every 12 hours thereafter.   HOSPITAL COURSE:  The patient presenting to [facility] in transfer  from [place] after appearance there fore multiple ICD discharges.  His  atrial fibrillation has been well stabilized on rate control medications,  metoprolol, and digoxin.  He is now started on Tikosyn and he has completed  six doses at the time of discharge.  His QTC has not been prolonged.  The  patient has had no further ICD discharges and has maintained a fairly solid  sinus rhythm.  We are discharging 11/02 with Lovenox bridging since his INR  throughout this hospitalization has been subtherapeutic in the range of 1.7  and 1.8.  With this in mind, his Coumadin doses have been increased from 5  mg daily with some 7.5 mg doses to 7.5 mg doses with some 10 mg doses.   MEDICATIONS AT DISCHARGE:  1. Metoprolol 50 mg twice daily to be taken at 7 a.m. and 7 p.m.  This is      new medication.  2. Digoxin 0.25 mg daily to be taken at 7 a.m.  This is a new dose.  He      had been on 0.125 mg daily.  3. Lisinopril 2.5 mg daily.  4. Zocor 20 mg daily at bedtime.  This is a new dose.  5. Potassium chloride 10 mEq daily at 7:00 in the morning.  This is new.      Important for the patient to stay at a potassium greater than 4.  6. Tikosyn 500 mcg twice daily to be taken at 7:00 a.m. and 7:00 p.m.  7. He will be given a prescription for Lovenox 120 mg to be taken in the      morning at 7:00 on Saturday 11/03.  He will be given one last shot of      Lovenox prior to discharge from [facility] 11/02.  8. Coumadin dosing over the weekend 10 mg on Saturday, 7.5 mg on Sunday.      He will present to the Coumadin  clinic Monday 11/05 at 8:45 and take      Coumadin thereafter as they direct.  He will also see Dr. Andee Lineman Wednesday 11/07 at 12:30.  This is an  appointment that has been  prearranged.  The patient is asked to stop his  Toprol at this time.   LABORATORY STUDIES:  This admission, at discharge, serum electrolytes:  Sodium 135, potassium 4.9, chloride 100, carbonate 26, BUN is 10, creatinine  1, glucose is 92.  His potassium had been trending gently upward on 20 mEq  daily.  This has now been cut to 10 mEq daily.  Complete blood count, white  cells 6.7, hemoglobin 15.7, hematocrit 46.7, platelets 251.  PT was 22.1,  INR1.8.      Maple Mirza, PA     GM/MEDQ  D:  09/16/2006  T:  09/17/2006  Job:  [id number]   cc:   Duke Salvia, MD, [facility]  Learta Codding, MD,FACC  Erasmo Downer, MD

## 2011-04-07 ENCOUNTER — Encounter: Payer: Self-pay | Admitting: Cardiology

## 2011-04-08 ENCOUNTER — Encounter: Payer: Self-pay | Admitting: Cardiology

## 2011-04-08 ENCOUNTER — Ambulatory Visit (INDEPENDENT_AMBULATORY_CARE_PROVIDER_SITE_OTHER): Payer: 59 | Admitting: Cardiology

## 2011-04-08 DIAGNOSIS — I4891 Unspecified atrial fibrillation: Secondary | ICD-10-CM

## 2011-04-08 DIAGNOSIS — I10 Essential (primary) hypertension: Secondary | ICD-10-CM

## 2011-04-08 DIAGNOSIS — I5022 Chronic systolic (congestive) heart failure: Secondary | ICD-10-CM

## 2011-04-08 DIAGNOSIS — E669 Obesity, unspecified: Secondary | ICD-10-CM

## 2011-04-08 NOTE — Assessment & Plan Note (Signed)
The blood pressure is at target. No change in medications is indicated. We will continue with therapeutic lifestyle changes (TLC).  

## 2011-04-08 NOTE — Patient Instructions (Signed)
Continue all current medications. Your physician wants you to follow up in: 6 months.  You will receive a reminder letter in the mail one-two months in advance.  If you don't receive a letter, please call our office to schedule the follow up appointment   

## 2011-04-08 NOTE — Assessment & Plan Note (Signed)
The patient understands the need to lose weight with diet and exercise. We have discussed specific strategies for this.  

## 2011-04-08 NOTE — Progress Notes (Signed)
HPI The patient presents for followup of his cardiomyopathy and arrhythmia. Since he was last seen here he has done well. He has had no presyncope syncope or palpitations. He has had no firings of his defibrillator. He denies any shortness of breath, PND or orthopnea. His weight is stable. He tries to do some walking or exercise though I think is relatively sedentary. He doesn't have new edema, chest discomfort, neck or arm discomfort.  No Known Allergies  Current Outpatient Prescriptions  Medication Sig Dispense Refill  . Cholecalciferol (VITAMIN D3) 2000 UNITS capsule Take 2,000 Units by mouth daily.        Marland Kitchen diltiazem (CARDIZEM CD) 180 MG 24 hr capsule TAKE ONE CAPSULE BY MOUTH EVERY DAY  90 capsule  3  . finasteride (PROSCAR) 5 MG tablet Take 5 mg by mouth daily.        Marland Kitchen lisinopril (PRINIVIL,ZESTRIL) 10 MG tablet Take 10 mg by mouth daily.        . metoprolol (LOPRESSOR) 100 MG tablet TAKE ONE TABLET BY MOUTH TWICE DAILY  180 tablet  3  . potassium chloride (MICRO-K) 10 MEQ CR capsule TAKE ONE CAPSULE BY MOUTH EVERY DAY  90 capsule  3  . pravastatin (PRAVACHOL) 40 MG tablet Take 40 mg by mouth daily.        Marland Kitchen warfarin (COUMADIN) 4 MG tablet Take 4 mg by mouth daily.        Marland Kitchen warfarin (COUMADIN) 5 MG tablet Take 5 mg by mouth. Take as directed          Past Medical History  Diagnosis Date  . Systolic heart failure   . Hypertension   . AF (atrial fibrillation)   . Tachycardia 04/2010    EF 35-40% BY 2D ECHO,    Past Surgical History  Procedure Date  . S/p icd 11/15/07    W/ SUBSEQUENT POCKET VERSION 7/11 FOR THREATENED EROSION    ROS:  As stated in the HPI and negative for all other systems except for erectile dysfunction.  PHYSICAL EXAM BP 135/85  Pulse 56  Ht 6\' 5"  (1.956 m)  Wt 296 lb (134.265 kg)  BMI 35.10 kg/m2  SpO2 98% GENERAL:  Well appearing HEENT:  Pupils equal round and reactive, fundi not visualized, oral mucosa unremarkable NECK:  No jugular venous  distention, waveform within normal limits, carotid upstroke brisk and symmetric, no bruits, no thyromegaly LYMPHATICS:  No cervical, inguinal adenopathy LUNGS:  Clear to auscultation bilaterally BACK:  No CVA tenderness CHEST:  ICD scar. HEART:  PMI not displaced or sustained,S1 and S2 within normal limits, no S3, no S4, no clicks, no rubs, no murmurs ABD:  Flat, positive bowel sounds normal in frequency in pitch, no bruits, no rebound, no guarding, no midline pulsatile mass, no hepatomegaly, no splenomegaly EXT:  2 plus pulses throughout, no edema, no cyanosis no clubbing SKIN:  No rashes no nodules NEURO:  Cranial nerves II through XII grossly intact, motor grossly intact throughout PSYCH:  Cognitively intact, oriented to person place and time   ASSESSMENT AND PLAN

## 2011-04-08 NOTE — Assessment & Plan Note (Signed)
He tolerates this rhythm with rate control and anticoagulation.

## 2011-04-08 NOTE — Assessment & Plan Note (Signed)
The patient is euvolemic in a stable medical regimen. I would not suggest changes to this.

## 2011-04-09 ENCOUNTER — Ambulatory Visit: Payer: Self-pay | Admitting: Cardiology

## 2011-05-24 ENCOUNTER — Other Ambulatory Visit: Payer: Self-pay | Admitting: Cardiology

## 2011-07-15 ENCOUNTER — Encounter: Payer: Self-pay | Admitting: Internal Medicine

## 2011-07-15 ENCOUNTER — Ambulatory Visit (INDEPENDENT_AMBULATORY_CARE_PROVIDER_SITE_OTHER): Payer: 59 | Admitting: *Deleted

## 2011-07-15 DIAGNOSIS — I428 Other cardiomyopathies: Secondary | ICD-10-CM

## 2011-07-15 DIAGNOSIS — Z95810 Presence of automatic (implantable) cardiac defibrillator: Secondary | ICD-10-CM

## 2011-07-15 LAB — ICD DEVICE OBSERVATION
DEV-0020ICD: NEGATIVE
FVT: 0
PACEART VT: 0
RV LEAD AMPLITUDE: 4.4 mv
RV LEAD IMPEDENCE ICD: 208 Ohm
TOT-0006: 20081231000000
TZAT-0001ATACH: 2
TZAT-0001FASTVT: 1
TZAT-0002ATACH: NEGATIVE
TZAT-0002ATACH: NEGATIVE
TZAT-0002ATACH: NEGATIVE
TZAT-0002FASTVT: NEGATIVE
TZAT-0004SLOWVT: 8
TZAT-0005SLOWVT: 88 pct
TZAT-0011SLOWVT: 10 ms
TZAT-0012ATACH: 150 ms
TZAT-0012ATACH: 150 ms
TZAT-0012ATACH: 150 ms
TZAT-0012FASTVT: 200 ms
TZAT-0012SLOWVT: 200 ms
TZAT-0013SLOWVT: 3
TZAT-0018ATACH: NEGATIVE
TZAT-0018FASTVT: NEGATIVE
TZAT-0018SLOWVT: NEGATIVE
TZAT-0019ATACH: 6 V
TZAT-0019SLOWVT: 8 V
TZAT-0020ATACH: 1.5 ms
TZAT-0020ATACH: 1.5 ms
TZON-0003ATACH: 350 ms
TZON-0003SLOWVT: 400 ms
TZON-0004SLOWVT: 16
TZON-0004VSLOWVT: 32
TZON-0005SLOWVT: 12
TZST-0001ATACH: 5
TZST-0001ATACH: 6
TZST-0001FASTVT: 2
TZST-0001FASTVT: 5
TZST-0001FASTVT: 6
TZST-0002ATACH: NEGATIVE
TZST-0002ATACH: NEGATIVE
TZST-0002FASTVT: NEGATIVE
TZST-0002FASTVT: NEGATIVE
TZST-0002FASTVT: NEGATIVE
TZST-0002FASTVT: NEGATIVE
TZST-0003SLOWVT: 35 J
TZST-0003SLOWVT: 35 J
VENTRICULAR PACING ICD: 16.99 pct
VF: 0

## 2011-07-15 NOTE — Progress Notes (Signed)
icd check in clinic  

## 2011-08-20 LAB — BASIC METABOLIC PANEL
BUN: 11
Creatinine, Ser: 1.02
GFR calc Af Amer: >60
Glucose, Bld: 95
Potassium: 3.9

## 2011-08-20 LAB — CBC
HCT: 47.1
Hemoglobin: 16
RDW: 13.6

## 2011-09-10 ENCOUNTER — Encounter: Payer: Self-pay | Admitting: Internal Medicine

## 2011-09-10 ENCOUNTER — Ambulatory Visit (INDEPENDENT_AMBULATORY_CARE_PROVIDER_SITE_OTHER): Payer: 59 | Admitting: Internal Medicine

## 2011-09-10 DIAGNOSIS — I10 Essential (primary) hypertension: Secondary | ICD-10-CM

## 2011-09-10 DIAGNOSIS — I4891 Unspecified atrial fibrillation: Secondary | ICD-10-CM

## 2011-09-10 DIAGNOSIS — Z95810 Presence of automatic (implantable) cardiac defibrillator: Secondary | ICD-10-CM

## 2011-09-10 DIAGNOSIS — I5022 Chronic systolic (congestive) heart failure: Secondary | ICD-10-CM

## 2011-09-10 DIAGNOSIS — I428 Other cardiomyopathies: Secondary | ICD-10-CM

## 2011-09-10 LAB — ICD DEVICE OBSERVATION
AL AMPLITUDE: 1 mv
AL IMPEDENCE ICD: 552 Ohm
ATRIAL PACING ICD: 0.54 pct
BATTERY VOLTAGE: 2.64 v
BRDY-0002RV: 50 {beats}/min
BRDY-0004RV: 130 {beats}/min
CHARGE TIME: 11.921 s
DEV-0020ICD: NEGATIVE
FVT: 0
PACEART VT: 0
RV LEAD AMPLITUDE: 5.8 mv
RV LEAD IMPEDENCE ICD: 196 Ohm
RV LEAD THRESHOLD: 1.5 v
TOT-0001: 7
TOT-0002: 0
TOT-0006: 20081231000000
TZAT-0001ATACH: 1
TZAT-0001ATACH: 2
TZAT-0001ATACH: 3
TZAT-0001FASTVT: 1
TZAT-0001SLOWVT: 1
TZAT-0002ATACH: NEGATIVE
TZAT-0002ATACH: NEGATIVE
TZAT-0002ATACH: NEGATIVE
TZAT-0002FASTVT: NEGATIVE
TZAT-0004SLOWVT: 8
TZAT-0005SLOWVT: 88 pct
TZAT-0011SLOWVT: 10 ms
TZAT-0012ATACH: 150 ms
TZAT-0012ATACH: 150 ms
TZAT-0012ATACH: 150 ms
TZAT-0012FASTVT: 200 ms
TZAT-0012SLOWVT: 200 ms
TZAT-0013SLOWVT: 3
TZAT-0018ATACH: NEGATIVE
TZAT-0018ATACH: NEGATIVE
TZAT-0018ATACH: NEGATIVE
TZAT-0018FASTVT: NEGATIVE
TZAT-0018SLOWVT: NEGATIVE
TZAT-0019ATACH: 6 v
TZAT-0019ATACH: 6 v
TZAT-0019ATACH: 6 v
TZAT-0019FASTVT: 8 v
TZAT-0019SLOWVT: 8 v
TZAT-0020ATACH: 1.5 ms
TZAT-0020ATACH: 1.5 ms
TZAT-0020ATACH: 1.5 ms
TZAT-0020FASTVT: 1.5 ms
TZAT-0020SLOWVT: 1.5 ms
TZON-0003ATACH: 350 ms
TZON-0003SLOWVT: 400 ms
TZON-0003VSLOWVT: 450 ms
TZON-0004SLOWVT: 16
TZON-0004VSLOWVT: 32
TZON-0005SLOWVT: 12
TZST-0001ATACH: 4
TZST-0001ATACH: 5
TZST-0001ATACH: 6
TZST-0001FASTVT: 2
TZST-0001FASTVT: 3
TZST-0001FASTVT: 4
TZST-0001FASTVT: 5
TZST-0001FASTVT: 6
TZST-0001SLOWVT: 2
TZST-0001SLOWVT: 3
TZST-0001SLOWVT: 4
TZST-0001SLOWVT: 5
TZST-0001SLOWVT: 6
TZST-0002ATACH: NEGATIVE
TZST-0002ATACH: NEGATIVE
TZST-0002ATACH: NEGATIVE
TZST-0002FASTVT: NEGATIVE
TZST-0002FASTVT: NEGATIVE
TZST-0002FASTVT: NEGATIVE
TZST-0002FASTVT: NEGATIVE
TZST-0002FASTVT: NEGATIVE
TZST-0003SLOWVT: 20 J
TZST-0003SLOWVT: 35 J
TZST-0003SLOWVT: 35 J
TZST-0003SLOWVT: 35 J
TZST-0003SLOWVT: 35 J
VENTRICULAR PACING ICD: 12.61 pct
VF: 0

## 2011-09-10 NOTE — Assessment & Plan Note (Signed)
Stable No change required today  

## 2011-09-10 NOTE — Progress Notes (Signed)
The patient presents today for routine electrophysiology followup.  Since last being seen in our clinic, the patient reports doing very well.   He is trying to lose weight as suggested by Dr Antoine Poche.   Today, he denies symptoms of palpitations, chest pain, shortness of breath, orthopnea, PND,dizziness, presyncope, syncope, or neurologic sequela.  The patient feels that he is tolerating medications without difficulties and is otherwise without complaint today.   Past Medical History  Diagnosis Date  . Systolic heart failure     EF 16-10% BY 2D ECHO,  . Hypertension   . AF (atrial fibrillation)     permanent, on coumadin  . Nonischemic cardiomyopathy    Past Surgical History  Procedure Date  . S/p icd 11/15/07    W/ SUBSEQUENT POCKET VERSION 7/11 FOR THREATEND EROSION    Current Outpatient Prescriptions  Medication Sig Dispense Refill  . Cholecalciferol (VITAMIN D3) 2000 UNITS capsule Take 2,000 Units by mouth daily.        Marland Kitchen diltiazem (CARDIZEM CD) 180 MG 24 hr capsule TAKE ONE CAPSULE BY MOUTH EVERY DAY  90 capsule  3  . finasteride (PROSCAR) 5 MG tablet Take 5 mg by mouth daily.        Marland Kitchen lisinopril (PRINIVIL,ZESTRIL) 10 MG tablet TAKE ONE TABLET BY MOUTH EVERY DAY  90 tablet  3  . metoprolol (LOPRESSOR) 100 MG tablet TAKE ONE TABLET BY MOUTH TWICE DAILY  180 tablet  3  . potassium chloride (MICRO-K) 10 MEQ CR capsule TAKE ONE CAPSULE BY MOUTH EVERY DAY  90 capsule  3  . pravastatin (PRAVACHOL) 40 MG tablet Take 40 mg by mouth at bedtime.       Marland Kitchen warfarin (COUMADIN) 2 MG tablet Take 2 mg by mouth daily. Managed by PMD      . warfarin (COUMADIN) 4 MG tablet Take 4 mg by mouth daily.        Marland Kitchen warfarin (COUMADIN) 5 MG tablet Take 5 mg by mouth. Take as directed          No Known Allergies  History   Social History  . Marital Status: Single    Spouse Name: N/A    Number of Children: N/A  . Years of Education: N/A   Occupational History  . [employer]    Social History Main  Topics  . Smoking status: Former Smoker -- 0.8 packs/day for 6 years    Types: Cigarettes    Quit date: 11/15/1996  . Smokeless tobacco: Never Used  . Alcohol Use: No  . Drug Use: Not on file  . Sexually Active: Not on file   Other Topics Concern  . Not on file   Social History Narrative  . No narrative on file    Family History  Problem Relation Age of Onset  . Diabetes      Physical Exam: Filed Vitals:   09/10/11 1309  BP: 137/85  Pulse: 82  Height: 6\' 5"  (1.956 m)  Weight: 277 lb (125.646 kg)    GEN- The patient is well appearing, alert and oriented x 3 today.   Head- normocephalic, atraumatic Eyes-  Sclera clear, conjunctiva pink Ears- hearing intact Oropharynx- clear Neck- supple, no JVP Lymph- no cervical lymphadenopathy Lungs- Clear to ausculation bilaterally, normal work of breathing Chest- ICD pocket is well healed, prior threatened erosion has resolved Heart- Regular rate and rhythm, no murmurs, rubs or gallops, PMI not laterally displaced GI- soft, NT, ND, + BS Extremities- no clubbing, cyanosis, or edema  ICD interrogation- reviewed in detail today,  See PACEART report  Assessment and Plan:

## 2011-09-10 NOTE — Assessment & Plan Note (Signed)
Stable on exam Normal ICD function See Arita Miss Art report Approaching ERI,  V lead pacing impedance is chronically <200, though shock impendance is stable.  Will reprogram VVI 40 today Will consider new RV lead placement at time of generator change, however he already has 2 ICD leads and recent threatened erosion which makes my enthusiasm about new lead placement very low!

## 2011-09-10 NOTE — Assessment & Plan Note (Signed)
Permanent afib, Continue coumadin long term

## 2011-09-10 NOTE — Patient Instructions (Signed)
Follow up as scheduled with Belenda Cruise for device check. Your physician recommends that you continue on your current medications as directed. Please refer to the Current Medication list given to you today.

## 2011-10-18 ENCOUNTER — Encounter: Payer: Self-pay | Admitting: Cardiology

## 2011-10-18 ENCOUNTER — Ambulatory Visit (INDEPENDENT_AMBULATORY_CARE_PROVIDER_SITE_OTHER): Payer: 59 | Admitting: Cardiology

## 2011-10-18 VITALS — BP 133/81 | HR 78 | Resp 18 | Ht 77.0 in | Wt 278.0 lb

## 2011-10-18 DIAGNOSIS — R0602 Shortness of breath: Secondary | ICD-10-CM

## 2011-10-18 DIAGNOSIS — I10 Essential (primary) hypertension: Secondary | ICD-10-CM

## 2011-10-18 DIAGNOSIS — I4891 Unspecified atrial fibrillation: Secondary | ICD-10-CM

## 2011-10-18 DIAGNOSIS — I5022 Chronic systolic (congestive) heart failure: Secondary | ICD-10-CM

## 2011-10-18 DIAGNOSIS — N529 Male erectile dysfunction, unspecified: Secondary | ICD-10-CM | POA: Insufficient documentation

## 2011-10-18 MED ORDER — TADALAFIL 10 MG PO TABS: ORAL_TABLET | ORAL | Status: DC

## 2011-10-18 NOTE — Assessment & Plan Note (Signed)
Blood pressure is well controlled 

## 2011-10-18 NOTE — Assessment & Plan Note (Signed)
No definite intercurrent heart failure exacerbation. Will order an echocardiogram to followup an ejection fraction. Patient is on a beta blocker as well as an ACE inhibitor including Cardizem for rate control his atrial fibrillation I made no changes in her medications

## 2011-10-18 NOTE — Patient Instructions (Addendum)
   Echo  If the results of your test are normal or stable, you will receive a letter.  If they are abnormal, the nurse will contact you by phone. Cialis 10mg  - may take x 1 prior to sexual activity Your physician wants you to follow up in: 6 months.  You will receive a reminder letter in the mail one-two months in advance.  If you don't receive a letter, please call our office to schedule the follow up appointment

## 2011-10-18 NOTE — Assessment & Plan Note (Signed)
I provided a prescription of Cialis 10 mg to be used before intercourse and have given 20 tablets.

## 2011-10-18 NOTE — Assessment & Plan Note (Signed)
Rate controlled atrial fibrillation with no intercurrent countershock secondary to atrial fibrillation with rapid ventricular response.

## 2011-10-18 NOTE — Progress Notes (Signed)
   Peyton Bottoms, MD, [facility] ABIM Board Certified in Adult Cardiovascular Medicine,Internal Medicine and Critical Care Medicine    CC: Followup patient with a nonischemic cardiomyopathy  HPI:  The patient is a 55 year old male with a nonischemic cardiomyopathy ejection fraction 35-40% in permanent atrial fibrillation. He status post ICD. He reports no recent discharges. He has lost some weight. His in NYHA class II. He has no orthopnea PND palpitations or syncope. He reports some erectile dysfunction and is requesting a prescription for Cialis. His Coumadin is monitored by his primary care physician. We do not have those results available. He has had no intercurrent hospitalizations for heart failure or chest pain. Heart rate is well controlled on beta blocker and calcium channel blocker.     PMH: reviewed and listed in Problem List in Electronic Records (and see below). Past Medical History  Diagnosis Date  . Systolic heart failure     EF 16-10% BY 2D ECHO, status post ICD and pocket revision 711 for threatened erosion   . Hypertension   . AF (atrial fibrillation)     permanent, on coumadin  . Nonischemic cardiomyopathy     Allergies/SH/FHX : available in Electronic Records for review  Medications: Current Outpatient Prescriptions  Medication Sig Dispense Refill  . Cholecalciferol (VITAMIN D3) 2000 UNITS capsule Take 2,000 Units by mouth daily.        Marland Kitchen diltiazem (CARDIZEM CD) 180 MG 24 hr capsule TAKE ONE CAPSULE BY MOUTH EVERY DAY  90 capsule  3  . finasteride (PROSCAR) 5 MG tablet Take 5 mg by mouth daily.        Marland Kitchen lisinopril (PRINIVIL,ZESTRIL) 10 MG tablet TAKE ONE TABLET BY MOUTH EVERY DAY  90 tablet  3  . metoprolol (LOPRESSOR) 100 MG tablet TAKE ONE TABLET BY MOUTH TWICE DAILY  180 tablet  3  . potassium chloride (MICRO-K) 10 MEQ CR capsule TAKE ONE CAPSULE BY MOUTH EVERY DAY  90 capsule  3  . pravastatin (PRAVACHOL) 40 MG tablet Take 40 mg by mouth at bedtime.       Marland Kitchen  warfarin (COUMADIN) 2 MG tablet Take 2 mg by mouth daily. Managed by PMD      . warfarin (COUMADIN) 4 MG tablet Take 4 mg by mouth daily.        Marland Kitchen warfarin (COUMADIN) 5 MG tablet Take 5 mg by mouth. Take as directed          ROS: No nausea or vomiting. No fever or chills.No melena or hematochezia.No bleeding.No claudication  Physical Exam: BP 133/81  Pulse 78  Resp 18  Ht 6\' 5"  (1.956 m)  Wt 278 lb (126.1 kg)  BMI 32.97 kg/m2 General: Overweight African American male but in no distress. Neck: Normal carotid upstroke and no carotid bruits. No thyromegaly nonnodular thyroid. Lungs: Clear breath sounds bilaterally with no wheezing. Cardiac: Irregular rate and rhythm with normal S1-S2 but no pathological murmurs Vascular: No edema. Normal dorsalis pedis and posterior tibial pulses Skin: Warm and dry  12lead ECG: Atrial fibrillation with nonspecific T-wave changes. Limited bedside ECHO:N/A   Assessment and Plan

## 2011-10-28 ENCOUNTER — Other Ambulatory Visit (INDEPENDENT_AMBULATORY_CARE_PROVIDER_SITE_OTHER): Payer: 59 | Admitting: *Deleted

## 2011-10-28 DIAGNOSIS — I5022 Chronic systolic (congestive) heart failure: Secondary | ICD-10-CM

## 2011-10-28 DIAGNOSIS — R0602 Shortness of breath: Secondary | ICD-10-CM

## 2011-10-28 DIAGNOSIS — I4891 Unspecified atrial fibrillation: Secondary | ICD-10-CM

## 2011-11-04 ENCOUNTER — Ambulatory Visit (INDEPENDENT_AMBULATORY_CARE_PROVIDER_SITE_OTHER): Payer: 59 | Admitting: *Deleted

## 2011-11-04 ENCOUNTER — Encounter: Payer: Self-pay | Admitting: Internal Medicine

## 2011-11-04 DIAGNOSIS — Z95810 Presence of automatic (implantable) cardiac defibrillator: Secondary | ICD-10-CM

## 2011-11-04 DIAGNOSIS — I4891 Unspecified atrial fibrillation: Secondary | ICD-10-CM

## 2011-11-04 LAB — ICD DEVICE OBSERVATION
BATTERY VOLTAGE: 2.62 V
RV LEAD IMPEDENCE ICD: 200 Ohm
TZAT-0001ATACH: 1
TZAT-0001ATACH: 3
TZAT-0002ATACH: NEGATIVE
TZAT-0002ATACH: NEGATIVE
TZAT-0004SLOWVT: 8
TZAT-0011SLOWVT: 10 ms
TZAT-0012ATACH: 150 ms
TZAT-0012FASTVT: 200 ms
TZAT-0018ATACH: NEGATIVE
TZAT-0018ATACH: NEGATIVE
TZAT-0018FASTVT: NEGATIVE
TZAT-0019ATACH: 6 V
TZAT-0019FASTVT: 8 V
TZAT-0019SLOWVT: 8 V
TZAT-0020FASTVT: 1.5 ms
TZST-0001ATACH: 5
TZST-0001ATACH: 6
TZST-0001FASTVT: 2
TZST-0001FASTVT: 5
TZST-0001FASTVT: 6
TZST-0001SLOWVT: 3
TZST-0001SLOWVT: 4
TZST-0001SLOWVT: 6
TZST-0002ATACH: NEGATIVE
TZST-0002ATACH: NEGATIVE
TZST-0002FASTVT: NEGATIVE
TZST-0002FASTVT: NEGATIVE
TZST-0002FASTVT: NEGATIVE
TZST-0003SLOWVT: 20 J
TZST-0003SLOWVT: 35 J

## 2011-11-04 NOTE — Progress Notes (Signed)
Battery check only 

## 2011-12-03 ENCOUNTER — Encounter: Payer: 59 | Admitting: *Deleted

## 2011-12-24 ENCOUNTER — Ambulatory Visit (INDEPENDENT_AMBULATORY_CARE_PROVIDER_SITE_OTHER): Payer: 59 | Admitting: *Deleted

## 2011-12-24 ENCOUNTER — Encounter: Payer: Self-pay | Admitting: Internal Medicine

## 2011-12-24 DIAGNOSIS — I5022 Chronic systolic (congestive) heart failure: Secondary | ICD-10-CM

## 2011-12-24 DIAGNOSIS — Z95810 Presence of automatic (implantable) cardiac defibrillator: Secondary | ICD-10-CM

## 2011-12-24 LAB — ICD DEVICE OBSERVATION
AL IMPEDENCE ICD: 560 Ohm
BATTERY VOLTAGE: 2.62 V
TZAT-0001ATACH: 1
TZAT-0001ATACH: 2
TZAT-0001SLOWVT: 1
TZAT-0002ATACH: NEGATIVE
TZAT-0002ATACH: NEGATIVE
TZAT-0002ATACH: NEGATIVE
TZAT-0012ATACH: 150 ms
TZAT-0012FASTVT: 200 ms
TZAT-0018ATACH: NEGATIVE
TZAT-0018ATACH: NEGATIVE
TZAT-0018ATACH: NEGATIVE
TZAT-0019ATACH: 6 V
TZAT-0019ATACH: 6 V
TZAT-0019SLOWVT: 8 V
TZAT-0020ATACH: 1.5 ms
TZAT-0020FASTVT: 1.5 ms
TZAT-0020SLOWVT: 1.5 ms
TZON-0003ATACH: 350 ms
TZST-0001ATACH: 4
TZST-0001ATACH: 5
TZST-0001FASTVT: 4
TZST-0001FASTVT: 5
TZST-0001SLOWVT: 2
TZST-0001SLOWVT: 4
TZST-0001SLOWVT: 6
TZST-0002ATACH: NEGATIVE
TZST-0002ATACH: NEGATIVE
TZST-0002FASTVT: NEGATIVE
TZST-0002FASTVT: NEGATIVE
TZST-0002FASTVT: NEGATIVE
TZST-0003SLOWVT: 20 J
TZST-0003SLOWVT: 35 J

## 2011-12-24 NOTE — Progress Notes (Signed)
Pt seen in clinic for follow up of ICD.  No complaints of chest pain, shortness of breath, dizziness, palpitations, or shocks.  Device functioning normally at this time.  For full details, see PaceArt report.  No programming changes made today.  Plan to follow up in 1 months with Dr Johney Frame  Gypsy Balsam, RN, BSN 12/24/2011 4:33 PM

## 2012-02-02 ENCOUNTER — Encounter: Payer: Self-pay | Admitting: Internal Medicine

## 2012-02-02 ENCOUNTER — Ambulatory Visit (INDEPENDENT_AMBULATORY_CARE_PROVIDER_SITE_OTHER): Payer: 59 | Admitting: Internal Medicine

## 2012-02-02 VITALS — BP 118/68 | HR 76 | Ht 77.0 in | Wt 283.0 lb

## 2012-02-02 DIAGNOSIS — I5022 Chronic systolic (congestive) heart failure: Secondary | ICD-10-CM

## 2012-02-02 DIAGNOSIS — Z95810 Presence of automatic (implantable) cardiac defibrillator: Secondary | ICD-10-CM

## 2012-02-02 DIAGNOSIS — I4891 Unspecified atrial fibrillation: Secondary | ICD-10-CM

## 2012-02-02 DIAGNOSIS — I10 Essential (primary) hypertension: Secondary | ICD-10-CM

## 2012-02-02 LAB — ICD DEVICE OBSERVATION
BATTERY VOLTAGE: 2.62 V
CHARGE TIME: 13 s
DEV-0020ICD: NEGATIVE
TZAT-0001ATACH: 1
TZAT-0001ATACH: 2
TZAT-0001FASTVT: 1
TZAT-0002ATACH: NEGATIVE
TZAT-0002ATACH: NEGATIVE
TZAT-0002FASTVT: NEGATIVE
TZAT-0011SLOWVT: 10 ms
TZAT-0012ATACH: 150 ms
TZAT-0012FASTVT: 200 ms
TZAT-0012SLOWVT: 200 ms
TZAT-0018ATACH: NEGATIVE
TZAT-0018ATACH: NEGATIVE
TZAT-0018ATACH: NEGATIVE
TZAT-0018SLOWVT: NEGATIVE
TZAT-0019ATACH: 6 V
TZAT-0019SLOWVT: 8 V
TZAT-0020ATACH: 1.5 ms
TZAT-0020FASTVT: 1.5 ms
TZAT-0020SLOWVT: 1.5 ms
TZON-0003ATACH: 350 ms
TZON-0004VSLOWVT: 32
TZON-0005SLOWVT: 12
TZST-0001FASTVT: 3
TZST-0001FASTVT: 4
TZST-0001FASTVT: 5
TZST-0001SLOWVT: 3
TZST-0001SLOWVT: 4
TZST-0001SLOWVT: 6
TZST-0002ATACH: NEGATIVE
TZST-0002ATACH: NEGATIVE
TZST-0002FASTVT: NEGATIVE
TZST-0002FASTVT: NEGATIVE
TZST-0003SLOWVT: 35 J

## 2012-02-02 NOTE — Assessment & Plan Note (Signed)
Stable No change required today  

## 2012-02-02 NOTE — Patient Instructions (Signed)
Follow up in 6 weeks with Dr. Johney Frame. Your physician recommends that you continue on your current medications as directed. Please refer to the Current Medication list given to you today.

## 2012-02-02 NOTE — Progress Notes (Signed)
The patient presents today for routine electrophysiology followup.  Since last being seen in our clinic, the patient reports doing very well.  He remains active.  Today, he denies symptoms of palpitations, chest pain, shortness of breath, orthopnea, PND,dizziness, presyncope, syncope, fevers, chills,  or ICD shocks.  The patient feels that he is tolerating medications without difficulties and is otherwise without complaint today.   He is initially s/p ICD implantation at [facility] in 1998 following a cardiac arrest.  He has a dual coil 6945 lead as well as a single ICD coil within the CS.  He underwent first gen change in [place].  In 2008, he had a generator replaced by Dr Graciela Husbands.  At that time, his RV pace/sense impedance was 280.  The lead was not revised.  He did well until 2011 when he developed discoloration of the inferolateral border of his ICD pocket with minor skin retraction and thinning.  I was concerned about threatened erosion and therefore reimplanted the device in a subpectoral pocket.  The skin has healed nicely and there is no evidence of infection clinically. Unfortuantely, his RV lead has continued to fail.  R waves are now 3 and his RV pace/sense threshold is <200.  He is also approaching ERI.   Past Medical History  Diagnosis Date  . Chronic systolic dysfunction of left ventricle     EF 35-40% BY 2D ECHO,  . Hypertension   . AF (atrial fibrillation)     permanent, on coumadin  . Nonischemic cardiomyopathy   . Cardiac arrest 1998    ICD implanted at [facility] at that time   Past Surgical History  Procedure Date  . S/p icd 1998    Current Outpatient Prescriptions  Medication Sig Dispense Refill  . Cholecalciferol (VITAMIN D3) 2000 UNITS capsule Take 2,000 Units by mouth daily.        Marland Kitchen diltiazem (CARDIZEM CD) 180 MG 24 hr capsule TAKE ONE CAPSULE BY MOUTH EVERY DAY  90 capsule  3  . finasteride (PROSCAR) 5 MG tablet Take 5 mg by mouth daily.        Marland Kitchen lisinopril  (PRINIVIL,ZESTRIL) 10 MG tablet TAKE ONE TABLET BY MOUTH EVERY DAY  90 tablet  3  . metoprolol (LOPRESSOR) 100 MG tablet TAKE ONE TABLET BY MOUTH TWICE DAILY  180 tablet  3  . potassium chloride (MICRO-K) 10 MEQ CR capsule TAKE ONE CAPSULE BY MOUTH EVERY DAY  90 capsule  3  . pravastatin (PRAVACHOL) 40 MG tablet Take 40 mg by mouth at bedtime.       . tadalafil (CIALIS) 10 MG tablet May take x 1 prior to sexual activity  10 tablet  1  . warfarin (COUMADIN) 2 MG tablet Take 2 mg by mouth daily. Managed by PMD      . warfarin (COUMADIN) 4 MG tablet Take 4 mg by mouth daily.        Marland Kitchen warfarin (COUMADIN) 5 MG tablet Take 5 mg by mouth. Take as directed          No Known Allergies  History   Social History  . Marital Status: Single    Spouse Name: N/A    Number of Children: N/A  . Years of Education: N/A   Occupational History  . [employer]    Social History Main Topics  . Smoking status: Former Smoker -- 0.8 packs/day for 6 years    Types: Cigarettes    Quit date: 11/15/1996  . Smokeless tobacco: Never Used  .  Alcohol Use: No  . Drug Use: No  . Sexually Active: Not on file   Other Topics Concern  . Not on file   Social History Narrative   Lives in [place]    Family History  Problem Relation Age of Onset  . Diabetes      Physical Exam: Filed Vitals:   02/02/12 1027  BP: 118/68  Pulse: 76  Height: 6\' 5"  (1.956 m)  Weight: 283 lb (128.368 kg)    GEN- The patient is well appearing, alert and oriented x 3 today.   Head- normocephalic, atraumatic Eyes-  Sclera clear, conjunctiva pink Ears- hearing intact Oropharynx- clear Neck- supple, no JVP Lymph- no cervical lymphadenopathy Lungs- Clear to ausculation bilaterally, normal work of breathing Chest- ICD pocket is well healed, prior threatened erosion has resolved Heart- Regular rate and rhythm, no murmurs, rubs or gallops, PMI not laterally displaced GI- soft, NT, ND, + BS Extremities- no clubbing, cyanosis, or  edema  ICD interrogation- reviewed in detail today,  See PACEART report  Assessment and Plan:

## 2012-02-02 NOTE — Assessment & Plan Note (Signed)
Stable from a standpoint of CHF  His ICD interrogation today continues to reveal low R waves (3mV) with a pace/sense impedance of <200 ohms.  As the lead is from 1998, I am concerned that it will continue to fail and may need to be replaced with upcoming generator change.  He already has 3 coils within his heart and I would be reluctant to place a new lead without at least consideration of extraction of the old RV defib lead.  I am also not certain as to the patency of the L subclavian system.  I have spoken with Dr Bard Herbert at [facility] this AM.  He and I agree that the patient warrants further consideration of extraction of his old RV ICD lead with replacement of the lead.  Given his experience with extraction, I have suggested that the patient be seen by Dr Bard Herbert.  He is willing to do so. I will therefore refer the patient to [facility].  Dr Bard Herbert anticipates that the patient could be seen there within 1-2 weeks.  I will see the patient again in 6 weeks anticipating that he will likely have had his procedure at that time.

## 2012-02-02 NOTE — Assessment & Plan Note (Signed)
Stable permanent afib Continue rate control and anticoagulation with coumadin Coumadin will likely need to be held prior to ICD system revision.

## 2012-02-18 ENCOUNTER — Other Ambulatory Visit: Payer: Self-pay | Admitting: Internal Medicine

## 2012-02-18 DIAGNOSIS — I4891 Unspecified atrial fibrillation: Secondary | ICD-10-CM

## 2012-02-18 DIAGNOSIS — I4729 Other ventricular tachycardia: Secondary | ICD-10-CM

## 2012-02-18 DIAGNOSIS — R002 Palpitations: Secondary | ICD-10-CM

## 2012-02-18 DIAGNOSIS — I472 Ventricular tachycardia: Secondary | ICD-10-CM

## 2012-02-22 ENCOUNTER — Telehealth: Payer: Self-pay | Admitting: Internal Medicine

## 2012-02-22 DIAGNOSIS — I4891 Unspecified atrial fibrillation: Secondary | ICD-10-CM

## 2012-02-22 NOTE — Telephone Encounter (Signed)
New msg Dr Arlys [name] Rubery's office called about echo that was ordered.  Please call them back to discuss.

## 2012-02-22 NOTE — Telephone Encounter (Signed)
Spoke with [place] who states she does not need anything further at this time. She wanted to make sure pt was having Echo done but she has already d/w pt. Report can be faxed to [phone].

## 2012-02-24 ENCOUNTER — Other Ambulatory Visit: Payer: Self-pay | Admitting: *Deleted

## 2012-02-24 MED ORDER — POTASSIUM CHLORIDE ER 10 MEQ PO CPCR: 10.0000 meq | ORAL_CAPSULE | Freq: Every day | ORAL | Status: DC

## 2012-02-24 MED ORDER — DILTIAZEM HCL ER COATED BEADS 180 MG PO CP24: 180.0000 mg | ORAL_CAPSULE | Freq: Every day | ORAL | Status: DC

## 2012-02-24 MED ORDER — METOPROLOL TARTRATE 100 MG PO TABS: 100.0000 mg | ORAL_TABLET | Freq: Two times a day (BID) | ORAL | Status: DC

## 2012-04-12 HISTORY — PX: OTHER SURGICAL HISTORY: SHX169

## 2012-04-28 ENCOUNTER — Ambulatory Visit (INDEPENDENT_AMBULATORY_CARE_PROVIDER_SITE_OTHER): Payer: 59 | Admitting: *Deleted

## 2012-04-28 ENCOUNTER — Encounter: Payer: Self-pay | Admitting: Internal Medicine

## 2012-04-28 DIAGNOSIS — Z95810 Presence of automatic (implantable) cardiac defibrillator: Secondary | ICD-10-CM

## 2012-04-28 DIAGNOSIS — I4891 Unspecified atrial fibrillation: Secondary | ICD-10-CM

## 2012-04-28 LAB — ICD DEVICE OBSERVATION
DEV-0020ICD: NEGATIVE
RV LEAD AMPLITUDE: 7 mv
RV LEAD THRESHOLD: 0.75 V

## 2012-04-28 NOTE — Progress Notes (Signed)
Wound check defib in clinic  

## 2012-05-09 ENCOUNTER — Ambulatory Visit (INDEPENDENT_AMBULATORY_CARE_PROVIDER_SITE_OTHER): Payer: 59 | Admitting: Cardiology

## 2012-05-09 ENCOUNTER — Encounter: Payer: Self-pay | Admitting: Cardiology

## 2012-05-09 VITALS — BP 108/74 | HR 76 | Ht 77.0 in | Wt 269.1 lb

## 2012-05-09 DIAGNOSIS — I4891 Unspecified atrial fibrillation: Secondary | ICD-10-CM

## 2012-05-09 DIAGNOSIS — I5022 Chronic systolic (congestive) heart failure: Secondary | ICD-10-CM

## 2012-05-09 DIAGNOSIS — Z95810 Presence of automatic (implantable) cardiac defibrillator: Secondary | ICD-10-CM

## 2012-05-09 NOTE — Progress Notes (Signed)
[name] Bottoms, MD, [facility] ABIM Board Certified in Adult Cardiovascular Medicine,Internal Medicine and Critical Care Medicine    CC: Followup patient nonischemic cardiomyopathy  HPI:  The patient is doing well. He lost a lot of weight and his been dieting. He feels his energy has improved and is more stamina he also is significantly short of breath. He denies any chest pain. He recently went to [facility] for ICD change out and lead revision. He reports no heart failure symptoms he has had no inappropriate shocks. He is tolerating his medications without any problems.  PMH: reviewed and listed in Problem List in Electronic Records (and see below) Past Medical History  Diagnosis Date  . Chronic systolic dysfunction of left ventricle     EF 35-40% BY 2D ECHO,  . Hypertension   . AF (atrial fibrillation)     permanent, on coumadin  . Nonischemic cardiomyopathy   . Cardiac arrest 1998    ICD implanted at [facility] at that time   Past Surgical History  Procedure Date  . S/p icd 1998    Allergies/SH/FHX : available in Electronic Records for review  No Known Allergies History   Social History  . Marital Status: Single    Spouse Name: N/A    Number of Children: N/A  . Years of Education: N/A   Occupational History  . [employer]    Social History Main Topics  . Smoking status: Former Smoker -- 0.8 packs/day for 6 years    Types: Cigarettes    Quit date: 11/15/1996  . Smokeless tobacco: Never Used  . Alcohol Use: No  . Drug Use: No  . Sexually Active: Not on file   Other Topics Concern  . Not on file   Social History Narrative   Lives in [place]   Family History  Problem Relation Age of Onset  . Diabetes      Medications: Current Outpatient Prescriptions  Medication Sig Dispense Refill  . Cholecalciferol (VITAMIN D3) 2000 UNITS capsule Take 2,000 Units by mouth daily.        Marland Kitchen diltiazem (CARDIZEM CD) 180 MG 24 hr capsule Take 1 capsule (180 mg total) by  mouth daily.  90 capsule  3  . finasteride (PROSCAR) 5 MG tablet Take 5 mg by mouth daily.        Marland Kitchen lisinopril (PRINIVIL,ZESTRIL) 10 MG tablet TAKE ONE TABLET BY MOUTH EVERY DAY  90 tablet  3  . metoprolol (LOPRESSOR) 100 MG tablet Take 1 tablet (100 mg total) by mouth 2 (two) times daily.  180 tablet  3  . polyethylene glycol powder (GLYCOLAX/MIRALAX) powder Take 17 g by mouth as needed.       . potassium chloride (MICRO-K) 10 MEQ CR capsule Take 1 capsule (10 mEq total) by mouth daily.  90 capsule  3  . pravastatin (PRAVACHOL) 40 MG tablet Take 40 mg by mouth at bedtime.       Marland Kitchen warfarin (COUMADIN) 2 MG tablet Take 2 mg by mouth daily. Managed by PMD      . warfarin (COUMADIN) 4 MG tablet Take 4 mg by mouth daily.        Marland Kitchen warfarin (COUMADIN) 5 MG tablet Take 5 mg by mouth. Take as directed          ROS: No nausea or vomiting. No fever or chills.No melena or hematochezia.No bleeding.No claudication  Physical Exam: BP 108/74  Pulse 76  Ht 6\' 5"  (1.956 m)  Wt 269 lb  1.9 oz (122.072 kg)  BMI 31.91 kg/m2 General: Overweight African American male but with significant weight loss Neck: Normal carotid upstroke no carotid bruits. No thyromegaly nonnodular thyroid. JVP is 6 cm Lungs: Clear breath sounds bilaterally without wheezing Cardiac: Irregular rate and rhythm with normal S1-S2 no murmur rubs or gallops Vascular: No edema. Normal distal pulses Skin: Warm and dry Physcologic: Normal affect  12lead ECG: Atrial fibrillation rate controlled no acute ischemic changes Limited bedside ECHO:N/A No images are attached to the encounter.   Assessment and Plan  ATRIAL FIBRILLATION Chronic atrial fibrillation rate controlled. INR followed by Dr. Margo Common.  SYSTOLIC HEART FAILURE, CHRONIC No evidence of volume overload. Patient is also significant amount of weight and has decreased stamina.  IMPLANTATION OF DEFIBRILLATOR, HX OF Patient recently has gone [facility] for ICD change out and lead  revision.   TACHYCARDIA No recurrent tachycardia. Heart rate well controlled. No change in medications   Patient Active Problem List  Diagnosis  . HYPERTENSION, UNSPECIFIED  . ATRIAL FIBRILLATION  . SYSTOLIC HEART FAILURE, CHRONIC  . TACHYCARDIA  . IMPLANTATION OF DEFIBRILLATOR, HX OF  . Obesity  . Erectile dysfunction

## 2012-05-09 NOTE — Assessment & Plan Note (Signed)
Patient recently has gone [facility] for ICD change out and lead revision.

## 2012-05-09 NOTE — Patient Instructions (Addendum)
Continue all current medications. Your physician wants you to follow up in: 6 months.  You will receive a reminder letter in the mail one-two months in advance.  If you don't receive a letter, please call our office to schedule the follow up appointment   

## 2012-05-09 NOTE — Assessment & Plan Note (Signed)
No evidence of volume overload. Patient is also significant amount of weight and has decreased stamina.

## 2012-05-09 NOTE — Assessment & Plan Note (Signed)
Chronic atrial fibrillation rate controlled. INR followed by Dr. Margo Common.

## 2012-05-09 NOTE — Assessment & Plan Note (Signed)
No recurrent tachycardia. Heart rate well controlled. No change in medications

## 2012-05-22 ENCOUNTER — Other Ambulatory Visit: Payer: Self-pay | Admitting: Cardiology

## 2012-05-22 MED ORDER — LISINOPRIL 10 MG PO TABS: 10.0000 mg | ORAL_TABLET | Freq: Every day | ORAL | Status: DC

## 2012-08-03 ENCOUNTER — Encounter: Payer: Self-pay | Admitting: Internal Medicine

## 2012-08-03 ENCOUNTER — Ambulatory Visit (INDEPENDENT_AMBULATORY_CARE_PROVIDER_SITE_OTHER): Payer: 59 | Admitting: Internal Medicine

## 2012-08-03 VITALS — BP 110/60 | HR 61 | Ht 77.0 in | Wt 262.0 lb

## 2012-08-03 DIAGNOSIS — I4891 Unspecified atrial fibrillation: Secondary | ICD-10-CM

## 2012-08-03 DIAGNOSIS — I5022 Chronic systolic (congestive) heart failure: Secondary | ICD-10-CM

## 2012-08-03 LAB — ICD DEVICE OBSERVATION
BATTERY VOLTAGE: 3.2276 V
BRDY-0002RV: 40 {beats}/min
FVT: 0
HV IMPEDENCE: 51 Ohm
PACEART VT: 0
RV LEAD THRESHOLD: 0.875 V
TZAT-0001FASTVT: 1
TZAT-0001SLOWVT: 1
TZAT-0002FASTVT: NEGATIVE
TZAT-0002SLOWVT: NEGATIVE
TZAT-0018SLOWVT: NEGATIVE
TZAT-0019FASTVT: 8 V
TZON-0003VSLOWVT: 360 ms
TZON-0004SLOWVT: 28
TZON-0004VSLOWVT: 20
TZON-0005SLOWVT: 12
TZST-0001FASTVT: 3
TZST-0001FASTVT: 4
TZST-0001FASTVT: 5
TZST-0001SLOWVT: 2
TZST-0001SLOWVT: 3
TZST-0001SLOWVT: 4
TZST-0002FASTVT: NEGATIVE
TZST-0002FASTVT: NEGATIVE
TZST-0002SLOWVT: NEGATIVE
TZST-0002SLOWVT: NEGATIVE
TZST-0002SLOWVT: NEGATIVE

## 2012-08-03 NOTE — Patient Instructions (Addendum)
Your physician recommends that you schedule a follow-up appointment in: 1 year with Dr. Johney Frame. You will receive a reminder letter in the mail in about 10 months reminding you to call and schedule your appointment. If you don't receive this letter, please contact our office.  Your physician recommends that you schedule a follow-up appointment in: 3 months with a cardiologist. Your physician recommends that you continue on your current medications as directed. Please refer to the Current Medication list given to you today.

## 2012-08-03 NOTE — Assessment & Plan Note (Signed)
Chronic atrial fibrillation rate controlled. INR followed by Dr. Tapper. 

## 2012-08-03 NOTE — Progress Notes (Signed)
PCP: [name], MD   [name] [name] is a 56 y.o. male who presents today for routine electrophysiology followup.  Since last being seen in our clinic, the patient reports doing very well. He underwent ICD system revision at [facility].   He remains active.  Today, he denies symptoms of palpitations, chest pain, shortness of breath, orthopnea, PND,dizziness, presyncope, syncope, fevers, chills,  or ICD shocks.  The patient feels that he is tolerating medications without difficulties and is otherwise without complaint today.   He is initially s/p ICD implantation at [facility] in 1998 following a cardiac arrest.  He has a dual coil 6945 lead as well as a single ICD coil within the CS.  He underwent first gen change in [name].  In 2008, he had a generator replaced by Dr Graciela Husbands.  At that time, his RV pace/sense impedance was 280.  The lead was not revised.  He did well until 2011 when he developed discoloration of the inferolateral border of his ICD pocket with minor skin retraction and thinning.  I was concerned about threatened erosion and therefore reimplanted the device in a subpectoral pocket.  He did well until 2013 when his RV lead failed.  I did not want to add another lead given his risks for infection/ difficulty long term.  He was therefore referred to [facility] and had extraction of his previous leads with new MDT [phone] lead placed.  He tolerated the procedure well.   Past Medical History  Diagnosis Date  . Chronic systolic dysfunction of left ventricle     EF 35-40% BY 2D ECHO,  . Hypertension   . AF (atrial fibrillation)     permanent, on coumadin  . Nonischemic cardiomyopathy   . Cardiac arrest 1998    ICD implanted at [facility] at that time   Past Surgical History  Procedure Date  . Cardiac defibrillator placement 1998  . Defibrillator system revision 04/12/12    MDT Protecta XT VR implanted at [facility] by Dr Isabell Jarvis with previously implanted system and leads extracted  due to RV lead failure    Current Outpatient Prescriptions  Medication Sig Dispense Refill  . Cholecalciferol (VITAMIN D3) 2000 UNITS capsule Take 2,000 Units by mouth daily.        Marland Kitchen diltiazem (CARDIZEM CD) 180 MG 24 hr capsule Take 1 capsule (180 mg total) by mouth daily.  90 capsule  3  . finasteride (PROSCAR) 5 MG tablet Take 5 mg by mouth daily.        Marland Kitchen lisinopril (PRINIVIL,ZESTRIL) 10 MG tablet Take 1 tablet (10 mg total) by mouth daily.  90 tablet  3  . metoprolol (LOPRESSOR) 100 MG tablet Take 1 tablet (100 mg total) by mouth 2 (two) times daily.  180 tablet  3  . polyethylene glycol powder (GLYCOLAX/MIRALAX) powder Take 17 g by mouth as needed.       . potassium chloride (MICRO-K) 10 MEQ CR capsule Take 1 capsule (10 mEq total) by mouth daily.  90 capsule  3  . pravastatin (PRAVACHOL) 40 MG tablet Take 40 mg by mouth at bedtime.       Marland Kitchen warfarin (COUMADIN) 2 MG tablet Take 2 mg by mouth daily. Managed by PMD      . warfarin (COUMADIN) 4 MG tablet Take 4 mg by mouth daily.        Marland Kitchen warfarin (COUMADIN) 5 MG tablet Take 5 mg by mouth. Take as directed  No Known Allergies  History   Social History  . Marital Status: Single    Spouse Name: N/A    Number of Children: N/A  . Years of Education: N/A   Occupational History  . [employer]    Social History Main Topics  . Smoking status: Former Smoker -- 0.8 packs/day for 6 years    Types: Cigarettes    Quit date: 11/15/1996  . Smokeless tobacco: Never Used  . Alcohol Use: No  . Drug Use: No  . Sexually Active: Not on file   Other Topics Concern  . Not on file   Social History Narrative   Lives in [place]    Family History  Problem Relation Age of Onset  . Diabetes      Physical Exam: Filed Vitals:   08/03/12 1037  BP: 110/60  Pulse: 61  Height: 6\' 5"  (1.956 m)  Weight: 262 lb (118.842 kg)  SpO2: 94%    GEN- The patient is well appearing, alert and oriented x 3 today.   Head- normocephalic,  atraumatic Eyes-  Sclera clear, conjunctiva pink Ears- hearing intact Oropharynx- clear Neck- supple, no JVP Lymph- no cervical lymphadenopathy Lungs- Clear to ausculation bilaterally, normal work of breathing Chest- ICD pocket is well healed, prior threatened erosion has resolved Heart- Regular rate and rhythm, no murmurs, rubs or gallops, PMI not laterally displaced GI- soft, NT, ND, + BS Extremities- no clubbing, cyanosis, or edema  ICD interrogation- reviewed in detail today,  See PACEART report  Assessment and Plan:

## 2012-08-03 NOTE — Assessment & Plan Note (Signed)
Stable euvolemic No changes  Normal ICD function See Pace Art report No changes today

## 2012-09-06 ENCOUNTER — Inpatient Hospital Stay (HOSPITAL_COMMUNITY)
Admission: EM | Admit: 2012-09-06 | Discharge: 2012-09-29 | DRG: 193 | Disposition: A | Discharge status: Skilled Nursing Facility | Payer: PRIVATE HEALTH INSURANCE | Attending: Family Medicine | Admitting: Family Medicine

## 2012-09-06 ENCOUNTER — Encounter (HOSPITAL_COMMUNITY): Payer: Self-pay

## 2012-09-06 ENCOUNTER — Emergency Department (HOSPITAL_COMMUNITY): Payer: PRIVATE HEALTH INSURANCE

## 2012-09-06 ENCOUNTER — Telehealth: Payer: Self-pay | Admitting: Cardiology

## 2012-09-06 DIAGNOSIS — Z87891 Personal history of nicotine dependence: Secondary | ICD-10-CM

## 2012-09-06 DIAGNOSIS — D72829 Elevated white blood cell count, unspecified: Secondary | ICD-10-CM | POA: Diagnosis present

## 2012-09-06 DIAGNOSIS — J9600 Acute respiratory failure, unspecified whether with hypoxia or hypercapnia: Secondary | ICD-10-CM | POA: Diagnosis present

## 2012-09-06 DIAGNOSIS — E669 Obesity, unspecified: Secondary | ICD-10-CM

## 2012-09-06 DIAGNOSIS — D509 Iron deficiency anemia, unspecified: Secondary | ICD-10-CM | POA: Diagnosis present

## 2012-09-06 DIAGNOSIS — J9601 Acute respiratory failure with hypoxia: Secondary | ICD-10-CM | POA: Diagnosis present

## 2012-09-06 DIAGNOSIS — I4891 Unspecified atrial fibrillation: Secondary | ICD-10-CM | POA: Diagnosis present

## 2012-09-06 DIAGNOSIS — E872 Acidosis, unspecified: Secondary | ICD-10-CM

## 2012-09-06 DIAGNOSIS — I469 Cardiac arrest, cause unspecified: Secondary | ICD-10-CM

## 2012-09-06 DIAGNOSIS — J189 Pneumonia, unspecified organism: Principal | ICD-10-CM | POA: Diagnosis present

## 2012-09-06 DIAGNOSIS — I5022 Chronic systolic (congestive) heart failure: Secondary | ICD-10-CM | POA: Diagnosis present

## 2012-09-06 DIAGNOSIS — E871 Hypo-osmolality and hyponatremia: Secondary | ICD-10-CM | POA: Diagnosis present

## 2012-09-06 DIAGNOSIS — I428 Other cardiomyopathies: Secondary | ICD-10-CM | POA: Diagnosis present

## 2012-09-06 DIAGNOSIS — E875 Hyperkalemia: Secondary | ICD-10-CM | POA: Diagnosis not present

## 2012-09-06 DIAGNOSIS — J690 Pneumonitis due to inhalation of food and vomit: Secondary | ICD-10-CM | POA: Diagnosis not present

## 2012-09-06 DIAGNOSIS — F17201 Nicotine dependence, unspecified, in remission: Secondary | ICD-10-CM

## 2012-09-06 DIAGNOSIS — D62 Acute posthemorrhagic anemia: Secondary | ICD-10-CM | POA: Diagnosis present

## 2012-09-06 DIAGNOSIS — N182 Chronic kidney disease, stage 2 (mild): Secondary | ICD-10-CM | POA: Diagnosis present

## 2012-09-06 DIAGNOSIS — D126 Benign neoplasm of colon, unspecified: Secondary | ICD-10-CM | POA: Diagnosis present

## 2012-09-06 DIAGNOSIS — Z7901 Long term (current) use of anticoagulants: Secondary | ICD-10-CM

## 2012-09-06 DIAGNOSIS — D649 Anemia, unspecified: Secondary | ICD-10-CM

## 2012-09-06 DIAGNOSIS — D6832 Hemorrhagic disorder due to extrinsic circulating anticoagulants: Secondary | ICD-10-CM | POA: Diagnosis present

## 2012-09-06 DIAGNOSIS — I509 Heart failure, unspecified: Secondary | ICD-10-CM | POA: Diagnosis present

## 2012-09-06 DIAGNOSIS — I5023 Acute on chronic systolic (congestive) heart failure: Secondary | ICD-10-CM | POA: Diagnosis not present

## 2012-09-06 DIAGNOSIS — Z95810 Presence of automatic (implantable) cardiac defibrillator: Secondary | ICD-10-CM | POA: Diagnosis present

## 2012-09-06 DIAGNOSIS — A419 Sepsis, unspecified organism: Secondary | ICD-10-CM

## 2012-09-06 DIAGNOSIS — N529 Male erectile dysfunction, unspecified: Secondary | ICD-10-CM

## 2012-09-06 DIAGNOSIS — K31819 Angiodysplasia of stomach and duodenum without bleeding: Secondary | ICD-10-CM | POA: Diagnosis present

## 2012-09-06 DIAGNOSIS — Z8674 Personal history of sudden cardiac arrest: Secondary | ICD-10-CM

## 2012-09-06 DIAGNOSIS — R040 Epistaxis: Secondary | ICD-10-CM | POA: Diagnosis not present

## 2012-09-06 DIAGNOSIS — I129 Hypertensive chronic kidney disease with stage 1 through stage 4 chronic kidney disease, or unspecified chronic kidney disease: Secondary | ICD-10-CM | POA: Diagnosis present

## 2012-09-06 DIAGNOSIS — I10 Essential (primary) hypertension: Secondary | ICD-10-CM

## 2012-09-06 DIAGNOSIS — D696 Thrombocytopenia, unspecified: Secondary | ICD-10-CM | POA: Diagnosis present

## 2012-09-06 DIAGNOSIS — N179 Acute kidney failure, unspecified: Secondary | ICD-10-CM | POA: Diagnosis present

## 2012-09-06 HISTORY — DX: Other cardiomyopathies: I42.8

## 2012-09-06 LAB — URINE MICROSCOPIC-ADD ON

## 2012-09-06 LAB — RETICULOCYTES
RBC.: 2.51 MIL/uL — ABNORMAL LOW (ref 4.22–5.81)
Retic Count, Absolute: 67.8 10*3/uL (ref 19.0–186.0)
Retic Ct Pct: 2.7 % (ref 0.4–3.1)

## 2012-09-06 LAB — BASIC METABOLIC PANEL
BUN: 32 mg/dL — ABNORMAL HIGH (ref 6–23)
CO2: 17 mEq/L — ABNORMAL LOW (ref 19–32)
Calcium: 8.6 mg/dL (ref 8.4–10.5)
Chloride: 100 mEq/L (ref 96–112)
Creatinine, Ser: 1.44 mg/dL — ABNORMAL HIGH (ref 0.50–1.35)
GFR calc Af Amer: 62 mL/min — ABNORMAL LOW (ref >90)
GFR calc non Af Amer: 54 mL/min — ABNORMAL LOW (ref >90)
Glucose, Bld: 109 mg/dL — ABNORMAL HIGH (ref 70–99)
Potassium: 4.3 mEq/L (ref 3.5–5.1)
Sodium: 130 mEq/L — ABNORMAL LOW (ref 135–145)

## 2012-09-06 LAB — CBC
HCT: 22.8 % — ABNORMAL LOW (ref 39.0–52.0)
Hemoglobin: 7.5 g/dL — ABNORMAL LOW (ref 13.0–17.0)
MCH: 26.4 pg (ref 26.0–34.0)
MCHC: 32.9 g/dL (ref 30.0–36.0)
MCV: 80.3 fL (ref 78.0–100.0)
Platelets: 149 10*3/uL — ABNORMAL LOW (ref 150–400)
RBC: 2.84 MIL/uL — ABNORMAL LOW (ref 4.22–5.81)
RDW: 19.9 % — ABNORMAL HIGH (ref 11.5–15.5)
WBC: 12 10*3/uL — ABNORMAL HIGH (ref 4.0–10.5)

## 2012-09-06 LAB — URINALYSIS, ROUTINE W REFLEX MICROSCOPIC
Glucose, UA: NEGATIVE mg/dL
Ketones, ur: NEGATIVE mg/dL
Nitrite: NEGATIVE
Protein, ur: 30 mg/dL — AB
Specific Gravity, Urine: 1.019 (ref 1.005–1.030)
Urobilinogen, UA: 4 mg/dL — ABNORMAL HIGH (ref 0.0–1.0)
pH: 5 (ref 5.0–8.0)

## 2012-09-06 LAB — PROTIME-INR
INR: 3.19 — ABNORMAL HIGH (ref 0.00–1.49)
Prothrombin Time: 30.9 seconds — ABNORMAL HIGH (ref 11.6–15.2)

## 2012-09-06 LAB — PREPARE RBC (CROSSMATCH)

## 2012-09-06 LAB — PRO B NATRIURETIC PEPTIDE: Pro B Natriuretic peptide (BNP): 5003 pg/mL — ABNORMAL HIGH (ref 0–125)

## 2012-09-06 LAB — POCT I-STAT TROPONIN I: Troponin i, poc: 0.02 ng/mL (ref 0.00–0.08)

## 2012-09-06 LAB — LACTIC ACID, PLASMA: Lactic Acid, Venous: 2.1 mmol/L (ref 0.5–2.2)

## 2012-09-06 MED ORDER — DEXTROSE 5 % IV SOLN
500.0000 mg | Freq: Once | INTRAVENOUS | Status: AC
  Administered 2012-09-07: 500 mg via INTRAVENOUS
  Filled 2012-09-06 (×2): qty 500

## 2012-09-06 MED ORDER — DEXTROSE 5 % IV SOLN
1.0000 g | Freq: Once | INTRAVENOUS | Status: AC
  Administered 2012-09-06: 1 g via INTRAVENOUS
  Filled 2012-09-06: qty 10

## 2012-09-06 MED ORDER — ACETAMINOPHEN 325 MG PO TABS
650.0000 mg | ORAL_TABLET | Freq: Once | ORAL | Status: AC
  Administered 2012-09-06: 650 mg via ORAL
  Filled 2012-09-06: qty 2

## 2012-09-06 MED ORDER — SODIUM CHLORIDE 0.9 % IV BOLUS (SEPSIS)
1000.0000 mL | Freq: Once | INTRAVENOUS | Status: AC
  Administered 2012-09-06: 1000 mL via INTRAVENOUS

## 2012-09-06 NOTE — ED Provider Notes (Signed)
History    56yM with dyspnea. Poor historian. Reports has been going on for weeks but worse in past couple days. Coughing. SOB with minimal activity, but improves with rest. Denies CP or pain anywhere else. No n/v. No fever. No unusual leg pain or swelling. Denies hx of blood clot. Pt with hx of nonischemic cardiomyopathy ejection fraction 35-40% in permanent atrial fibrillation. Is on coumadin. Denies BRBPR or melena.   CSN: [id number]  Arrival date & time 09/06/12  2122   First MD Initiated Contact with Patient 09/06/12 2136      Chief Complaint  Patient presents with  . Shortness of Breath    (Consider location/radiation/quality/duration/timing/severity/associated sxs/prior treatment) HPI  Past Medical History  Diagnosis Date  . Chronic systolic dysfunction of left ventricle     EF 35-40% BY 2D ECHO,  . Hypertension   . AF (atrial fibrillation)     permanent, on coumadin  . Nonischemic cardiomyopathy   . Cardiac arrest 1998    ICD implanted at [facility] at that time    Past Surgical History  Procedure Date  . Cardiac defibrillator placement 1998  . 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 Diabetes      History  Substance Use Topics  . Smoking status: Former Smoker -- 0.8 packs/day for 6 years    Types: Cigarettes    Quit date: 11/15/1996  . Smokeless tobacco: Never Used  . Alcohol Use: No      Review of Systems  Review of symptoms negative unless otherwise noted in HPI.   Allergies  Review of patient's allergies indicates no known allergies.  Home Medications   Current Outpatient Rx  Name Route Sig Dispense Refill  . VITAMIN D3 2000 UNITS PO CAPS Oral Take 2,000 Units by mouth daily.      Marland Kitchen DILTIAZEM HCL ER COATED BEADS 180 MG PO CP24 Oral Take 1 capsule (180 mg total) by  mouth daily. 90 capsule 3  . FINASTERIDE 5 MG PO TABS Oral Take 5 mg by mouth daily.      Marland Kitchen LISINOPRIL 10 MG PO TABS Oral Take 1 tablet (10 mg total) by mouth daily. 90 tablet 3  . METOPROLOL TARTRATE 100 MG PO TABS Oral Take 1 tablet (100 mg total) by mouth 2 (two) times daily. 180 tablet 3  . POLYETHYLENE GLYCOL 3350 PO POWD Oral Take 17 g by mouth as needed.     Marland Kitchen POTASSIUM CHLORIDE ER 10 MEQ PO CPCR Oral Take 1 capsule (10 mEq total) by mouth daily. 90 capsule 3  . PRAVASTATIN SODIUM 40 MG PO TABS Oral Take 40 mg by mouth at bedtime.     . WARFARIN SODIUM 2 MG PO TABS Oral Take 2 mg by mouth daily. Managed by PMD    . WARFARIN SODIUM 5 MG PO TABS Oral Take 5 mg by mouth. Take as directed        BP 116/66  Pulse 117  Temp 101.3 F (38.5 C) (Oral)  Resp 20  SpO2 97%  Physical Exam  Nursing note and vitals reviewed. Constitutional: He appears well-developed and well-nourished. No distress.  HENT:  Head: Normocephalic and atraumatic.  Eyes: Conjunctivae normal are normal. Right eye exhibits no discharge. Left eye exhibits no discharge.  Neck: Neck supple.  Cardiovascular: Normal  heart sounds.  Exam reveals no gallop and no friction rub.   No murmur heard.      Tachycardic with irreg irreg rhythm  Pulmonary/Chest:       tachypneic around 26-28. Did not appreciate any adventitious breath sounds.  Abdominal: Soft. He exhibits no distension. There is no tenderness.  Genitourinary:       DRE: no external lesions noted. Soft yellow stool. Occult neg.  Musculoskeletal: He exhibits no edema and no tenderness.       Lower extremities symmetric as compared to each other. No calf tenderness. Negative Homan's. No palpable cords.   Neurological: He is alert.  Skin: Skin is warm and dry. He is not diaphoretic.    ED Course  Procedures (including critical care time)  CRITICAL CARE Performed by: Raeford Razor   Total critical care time: 40 minutes  Critical care time was exclusive  of separately billable procedures and treating other patients.  Critical care was necessary to treat or prevent imminent or life-threatening deterioration.  Critical care was time spent personally by me on the following activities: development of treatment plan with patient and/or surrogate as well as nursing, discussions with consultants, evaluation of patient's response to treatment, examination of patient, obtaining history from patient or surrogate, ordering and performing treatments and interventions, ordering and review of laboratory studies, ordering and review of radiographic studies, pulse oximetry and re-evaluation of patient's condition.   Labs Reviewed  CBC - Abnormal; Notable for the following:    WBC 12.0 (*)     RBC 2.84 (*)     Hemoglobin 7.5 (*)     HCT 22.8 (*)     RDW 19.9 (*)     Platelets 149 (*)     All other components within normal limits  BASIC METABOLIC PANEL - Abnormal; Notable for the following:    Sodium 130 (*)     CO2 17 (*)     Glucose, Bld 109 (*)     BUN 32 (*)     Creatinine, Ser 1.44 (*)     GFR calc non Af Amer 54 (*)     GFR calc Af Amer 62 (*)     All other components within normal limits  PRO B NATRIURETIC PEPTIDE - Abnormal; Notable for the following:    Pro B Natriuretic peptide (BNP) 5003.0 (*)     All other components within normal limits  URINALYSIS, ROUTINE W REFLEX MICROSCOPIC - Abnormal; Notable for the following:    Color, Urine AMBER (*)  BIOCHEMICALS MAY BE AFFECTED BY COLOR   APPearance CLOUDY (*)     Hgb urine dipstick MODERATE (*)     Bilirubin Urine SMALL (*)     Protein, ur 30 (*)     Urobilinogen, UA 4.0 (*)     Leukocytes, UA TRACE (*)     All other components within normal limits  URINE MICROSCOPIC-ADD ON - Abnormal; Notable for the following:    Casts HYALINE CASTS (*)     All other components within normal limits  PROTIME-INR - Abnormal; Notable for the following:    Prothrombin Time 30.9 (*)     INR 3.19 (*)      All other components within normal limits  LACTIC ACID, PLASMA  POCT I-STAT TROPONIN I  PREPARE RBC (CROSSMATCH)  TYPE AND SCREEN  ABO/RH  VITAMIN B12  FOLATE  IRON AND TIBC  FERRITIN  RETICULOCYTES   Dg Chest 2 View  09/06/2012  *RADIOLOGY REPORT*  Clinical Data: Shortness of breath  CHEST - 2 VIEW  Comparison: 05/22/2010  Findings: Cardiomegaly.  Central vascular congestion. Changed left chest wall battery pack with additional lead tip since the prior. Bilateral patchy airspace opacities, right greater than left.  No pleural effusion.  No pneumothorax.  No acute osseous finding. There is a rounded calcific density projecting over the left upper quadrant, perhaps a splenic cyst, similar to prior.  IMPRESSION: Cardiomegaly with central vascular congestion.  Patchy airspace opacities are nonspecific.  Favored to reflect multifocal pneumonia versus pulmonary edema. Recommend radiograph follow-up after appropriate management to document resolution.   Original Report Authenticated By: Waneta Martins, M.D.    EKG:  Rhythm: Atrial fibrillation  Rate: 120 Axis: Normal Intervals: Normal ST segments: Nonspecific ST changes. There is T-wave flattening inferiorly and laterally   1. CAP (community acquired pneumonia)   2. Sepsis   3. Anemia   4. Metabolic acidosis   5. Acute kidney injury   6. Atrial fibrillation with RVR       MDM  56 year old male with dyspnea. Chest x-ray with multifocal opacities. Clinically pneumonia and not pulmonary edema.  Antibiotics were ordered for community acquired pneumonia. Patient is denying chest pain. He does have a significant heart history though. EKG shows atrial fibrillation with rapid ventricular rate. History of A. fib. Defered from rate controlling medication at this time given fever and anemia.  Antipyretics given. Transfuse PRBCs. Last comparison hemoglobin was 16, although from 4 years ago. Pt on coumadin. INR is only mildly supratherapeutic.  Yellowish stool on exam and occult negative.  Etiology not clear at this time. Platelets normal. Will require admission for further tx and evaluation.         Raeford Razor, MD 09/06/12 2325

## 2012-09-06 NOTE — Telephone Encounter (Signed)
Patient and brother informed nurse that patient was seen by Dr. Margo Common on yesterday and had labs drawn that reveiled low iron. Nurse spoke with  patient and brother and informed him that the symptoms he was having, sob,dizziness, and weakness could be coming from low iron symptoms. Nurse will call [facility] to see how patient is being treated for symptoms.

## 2012-09-06 NOTE — Telephone Encounter (Signed)
PATIENT'S brother came in to let us that he is SOB, weak,no appetite.  Has gone to see Dr Margo Common but he just stated that he had low Iron and that he needed to see his cardiologist.

## 2012-09-06 NOTE — Telephone Encounter (Signed)
Called [facility] office and left message for someone to call nurse back with plan of treatment for low iron since brother came to our office requesting patient see cardiologist.

## 2012-09-06 NOTE — ED Notes (Signed)
Patient transported to X-ray 

## 2012-09-06 NOTE — ED Notes (Signed)
Pt sts he's had a cough and felt weak for the past 3 weeks, lung sounds clear.  Denies being short of breath at this time.  Pts brother states that "he's not acting right".

## 2012-09-06 NOTE — ED Notes (Signed)
Pt poor historian. Pt states brother said he seemed SOB. Pt reports SOB increases with exertion. Denies CP, cough, congestion, N/V.

## 2012-09-06 NOTE — Telephone Encounter (Signed)
Per nurse at [facility], patient was instructed to go to [facility] by Dr. Margo Common not for low iron, but for evaluation of sob since they recently did work with ICD. Nurse called brother back and informed him of the above information. Nurse also called patient and informed him that he needed to be evaluated TODAY by an emergency room for the sob. Patient said he didn't have any transportation. Nurse advised patient instructed to call 911 for transportation by EMS or family to transport. Nurse also advised patient that he needed to go to nearest ED today and if he needed to be transported out, then they would be able to do this for him. Patient verbalized understanding of plan.

## 2012-09-07 ENCOUNTER — Encounter (HOSPITAL_COMMUNITY): Payer: Self-pay | Admitting: Internal Medicine

## 2012-09-07 ENCOUNTER — Inpatient Hospital Stay (HOSPITAL_COMMUNITY): Payer: PRIVATE HEALTH INSURANCE

## 2012-09-07 DIAGNOSIS — D509 Iron deficiency anemia, unspecified: Secondary | ICD-10-CM | POA: Diagnosis present

## 2012-09-07 DIAGNOSIS — E871 Hypo-osmolality and hyponatremia: Secondary | ICD-10-CM | POA: Diagnosis present

## 2012-09-07 DIAGNOSIS — J189 Pneumonia, unspecified organism: Secondary | ICD-10-CM | POA: Diagnosis present

## 2012-09-07 DIAGNOSIS — I10 Essential (primary) hypertension: Secondary | ICD-10-CM | POA: Diagnosis present

## 2012-09-07 DIAGNOSIS — I469 Cardiac arrest, cause unspecified: Secondary | ICD-10-CM | POA: Insufficient documentation

## 2012-09-07 DIAGNOSIS — F17201 Nicotine dependence, unspecified, in remission: Secondary | ICD-10-CM | POA: Insufficient documentation

## 2012-09-07 DIAGNOSIS — I428 Other cardiomyopathies: Secondary | ICD-10-CM | POA: Diagnosis present

## 2012-09-07 LAB — TROPONIN I
Troponin I: <0.3 ng/mL (ref <0.30)
Troponin I: <0.3 ng/mL (ref <0.30)

## 2012-09-07 LAB — COMPREHENSIVE METABOLIC PANEL
AST: 43 U/L — ABNORMAL HIGH (ref 0–37)
CO2: 20 mEq/L (ref 19–32)
Calcium: 8.5 mg/dL (ref 8.4–10.5)
Creatinine, Ser: 1.25 mg/dL (ref 0.50–1.35)
GFR calc non Af Amer: 64 mL/min — ABNORMAL LOW (ref >90)

## 2012-09-07 LAB — BASIC METABOLIC PANEL
CO2: 17 mEq/L — ABNORMAL LOW (ref 19–32)
Chloride: 103 mEq/L (ref 96–112)
GFR calc Af Amer: 74 mL/min — ABNORMAL LOW (ref >90)
Potassium: 4.2 mEq/L (ref 3.5–5.1)
Sodium: 131 mEq/L — ABNORMAL LOW (ref 135–145)

## 2012-09-07 LAB — CBC
HCT: 25.8 % — ABNORMAL LOW (ref 39.0–52.0)
Hemoglobin: 8.8 g/dL — ABNORMAL LOW (ref 13.0–17.0)
Platelets: 127 10*3/uL — ABNORMAL LOW (ref 150–400)
RBC: 3.19 MIL/uL — ABNORMAL LOW (ref 4.22–5.81)
RBC: 3.22 MIL/uL — ABNORMAL LOW (ref 4.22–5.81)
RDW: 18.4 % — ABNORMAL HIGH (ref 11.5–15.5)
WBC: 12.9 10*3/uL — ABNORMAL HIGH (ref 4.0–10.5)
WBC: 13.4 10*3/uL — ABNORMAL HIGH (ref 4.0–10.5)

## 2012-09-07 LAB — FOLATE: Folate: 3.7 ng/mL

## 2012-09-07 LAB — TSH: TSH: 2.973 u[IU]/mL (ref 0.350–4.500)

## 2012-09-07 LAB — IRON AND TIBC
Iron: 15 ug/dL — ABNORMAL LOW (ref 42–135)
Saturation Ratios: 7 % — ABNORMAL LOW (ref 20–55)
TIBC: 201 ug/dL — ABNORMAL LOW (ref 215–435)
UIBC: 186 ug/dL (ref 125–400)

## 2012-09-07 LAB — FERRITIN: Ferritin: 436 ng/mL — ABNORMAL HIGH (ref 22–322)

## 2012-09-07 LAB — ABO/RH: ABO/RH(D): O POS

## 2012-09-07 LAB — PREPARE RBC (CROSSMATCH)

## 2012-09-07 LAB — PRO B NATRIURETIC PEPTIDE: Pro B Natriuretic peptide (BNP): 5294 pg/mL — ABNORMAL HIGH (ref 0–125)

## 2012-09-07 LAB — CREATININE, URINE, RANDOM: Creatinine, Urine: 219.35 mg/dL

## 2012-09-07 LAB — VITAMIN B12: Vitamin B-12: 597 pg/mL (ref 211–911)

## 2012-09-07 LAB — PROTIME-INR: INR: 3.51 — ABNORMAL HIGH (ref 0.00–1.49)

## 2012-09-07 LAB — CBC WITH DIFFERENTIAL/PLATELET
Basophils Absolute: 0 10*3/uL (ref 0.0–0.1)
HCT: 22.4 % — ABNORMAL LOW (ref 39.0–52.0)
Lymphocytes Relative: 14 % (ref 12–46)
Neutro Abs: 5.9 10*3/uL (ref 1.7–7.7)
Neutrophils Relative %: 78 % — ABNORMAL HIGH (ref 43–77)
Platelets: 112 10*3/uL — ABNORMAL LOW (ref 150–400)
RDW: 19.4 % — ABNORMAL HIGH (ref 11.5–15.5)
WBC: 7.5 10*3/uL (ref 4.0–10.5)

## 2012-09-07 LAB — MRSA PCR SCREENING: MRSA by PCR: NEGATIVE

## 2012-09-07 LAB — STREP PNEUMONIAE URINARY ANTIGEN: Strep Pneumo Urinary Antigen: NEGATIVE

## 2012-09-07 MED ORDER — VITAMIN K1 10 MG/ML IJ SOLN
5.0000 mg | Freq: Once | INTRAVENOUS | Status: AC
  Administered 2012-09-07: 5 mg via INTRAVENOUS
  Filled 2012-09-07: qty 0.5

## 2012-09-07 MED ORDER — BOOST / RESOURCE BREEZE PO LIQD
1.0000 | Freq: Three times a day (TID) | ORAL | Status: DC
  Administered 2012-09-07 – 2012-09-13 (×16): 1 via ORAL
  Filled 2012-09-07 (×4): qty 1

## 2012-09-07 MED ORDER — HYDRALAZINE HCL 50 MG PO TABS
50.0000 mg | ORAL_TABLET | Freq: Three times a day (TID) | ORAL | Status: DC
  Administered 2012-09-07 – 2012-09-14 (×18): 50 mg via ORAL
  Filled 2012-09-07 (×22): qty 1

## 2012-09-07 MED ORDER — PRAVASTATIN SODIUM 40 MG PO TABS
40.0000 mg | ORAL_TABLET | Freq: Every day | ORAL | Status: DC
  Administered 2012-09-07 – 2012-09-28 (×20): 40 mg via ORAL
  Filled 2012-09-07 (×24): qty 1

## 2012-09-07 MED ORDER — PNEUMOCOCCAL VAC POLYVALENT 25 MCG/0.5ML IJ INJ
0.5000 mL | INJECTION | INTRAMUSCULAR | Status: AC
  Filled 2012-09-07: qty 0.5

## 2012-09-07 MED ORDER — FUROSEMIDE 10 MG/ML IJ SOLN
INTRAMUSCULAR | Status: AC
  Administered 2012-09-07: 40 mg via INTRAVENOUS
  Filled 2012-09-07: qty 4

## 2012-09-07 MED ORDER — LEVALBUTEROL HCL 0.63 MG/3ML IN NEBU
0.6300 mg | INHALATION_SOLUTION | RESPIRATORY_TRACT | Status: DC | PRN
  Administered 2012-09-07 – 2012-09-11 (×7): 0.63 mg via RESPIRATORY_TRACT

## 2012-09-07 MED ORDER — GUAIFENESIN ER 600 MG PO TB12
600.0000 mg | ORAL_TABLET | Freq: Two times a day (BID) | ORAL | Status: DC
  Administered 2012-09-07 – 2012-09-15 (×17): 600 mg via ORAL
  Filled 2012-09-07 (×20): qty 1

## 2012-09-07 MED ORDER — LEVALBUTEROL HCL 0.63 MG/3ML IN NEBU
0.6300 mg | INHALATION_SOLUTION | Freq: Four times a day (QID) | RESPIRATORY_TRACT | Status: DC
  Administered 2012-09-07 – 2012-09-10 (×11): 0.63 mg via RESPIRATORY_TRACT
  Filled 2012-09-07 (×17): qty 3

## 2012-09-07 MED ORDER — DEXTROSE 5 % IV SOLN
500.0000 mg | INTRAVENOUS | Status: DC
  Administered 2012-09-07 – 2012-09-11 (×3): 500 mg via INTRAVENOUS
  Filled 2012-09-07 (×5): qty 500

## 2012-09-07 MED ORDER — FUROSEMIDE 10 MG/ML IJ SOLN
20.0000 mg | Freq: Once | INTRAMUSCULAR | Status: DC
  Filled 2012-09-07: qty 2

## 2012-09-07 MED ORDER — SIMVASTATIN 20 MG PO TABS
20.0000 mg | ORAL_TABLET | Freq: Every day | ORAL | Status: DC
  Filled 2012-09-07: qty 1

## 2012-09-07 MED ORDER — ISOSORBIDE MONONITRATE ER 60 MG PO TB24
60.0000 mg | ORAL_TABLET | Freq: Every day | ORAL | Status: DC
  Administered 2012-09-08 – 2012-09-27 (×20): 60 mg via ORAL
  Filled 2012-09-07 (×20): qty 1

## 2012-09-07 MED ORDER — LISINOPRIL 20 MG PO TABS
20.0000 mg | ORAL_TABLET | Freq: Every day | ORAL | Status: DC
  Administered 2012-09-08 – 2012-09-10 (×3): 20 mg via ORAL
  Filled 2012-09-07 (×3): qty 1

## 2012-09-07 MED ORDER — PANTOPRAZOLE SODIUM 40 MG IV SOLR
40.0000 mg | INTRAVENOUS | Status: DC
  Administered 2012-09-07 – 2012-09-09 (×3): 40 mg via INTRAVENOUS
  Filled 2012-09-07 (×4): qty 40

## 2012-09-07 MED ORDER — DEXTROSE 5 % IV SOLN
1.0000 g | INTRAVENOUS | Status: DC
  Administered 2012-09-07 – 2012-09-08 (×2): 1 g via INTRAVENOUS
  Filled 2012-09-07 (×3): qty 10

## 2012-09-07 MED ORDER — SODIUM CHLORIDE 0.9 % IV SOLN
250.0000 mL | INTRAVENOUS | Status: DC | PRN
  Filled 2012-09-07: qty 250

## 2012-09-07 MED ORDER — PANTOPRAZOLE SODIUM 40 MG IV SOLR
40.0000 mg | Freq: Two times a day (BID) | INTRAVENOUS | Status: DC
  Filled 2012-09-07 (×2): qty 40

## 2012-09-07 MED ORDER — SODIUM CHLORIDE 0.9 % IJ SOLN
3.0000 mL | INTRAMUSCULAR | Status: DC | PRN
  Administered 2012-09-08 – 2012-09-18 (×2): 3 mL via INTRAVENOUS

## 2012-09-07 MED ORDER — SPIRONOLACTONE 25 MG PO TABS
25.0000 mg | ORAL_TABLET | Freq: Every day | ORAL | Status: DC
  Administered 2012-09-08 – 2012-09-11 (×4): 25 mg via ORAL
  Filled 2012-09-07 (×5): qty 1

## 2012-09-07 MED ORDER — FUROSEMIDE 10 MG/ML IJ SOLN
40.0000 mg | Freq: Two times a day (BID) | INTRAMUSCULAR | Status: DC
  Filled 2012-09-07 (×3): qty 4

## 2012-09-07 MED ORDER — METOPROLOL TARTRATE 100 MG PO TABS
100.0000 mg | ORAL_TABLET | Freq: Two times a day (BID) | ORAL | Status: DC
  Administered 2012-09-07 – 2012-09-14 (×16): 100 mg via ORAL
  Filled 2012-09-07 (×17): qty 1

## 2012-09-07 MED ORDER — FUROSEMIDE 40 MG PO TABS
40.0000 mg | ORAL_TABLET | Freq: Every day | ORAL | Status: DC
  Administered 2012-09-08: 40 mg via ORAL
  Filled 2012-09-07: qty 1

## 2012-09-07 MED ORDER — FUROSEMIDE 10 MG/ML IJ SOLN
40.0000 mg | INTRAMUSCULAR | Status: AC
  Administered 2012-09-07: 40 mg via INTRAVENOUS
  Filled 2012-09-07: qty 4

## 2012-09-07 MED ORDER — SODIUM CHLORIDE 0.9 % IJ SOLN
3.0000 mL | Freq: Two times a day (BID) | INTRAMUSCULAR | Status: DC
  Administered 2012-09-07: 22:00:00 via INTRAVENOUS
  Administered 2012-09-07 – 2012-09-17 (×15): 3 mL via INTRAVENOUS
  Administered 2012-09-18: 10:00:00 via INTRAVENOUS
  Administered 2012-09-19 – 2012-09-28 (×20): 3 mL via INTRAVENOUS

## 2012-09-07 MED ORDER — AZITHROMYCIN 500 MG PO TABS
500.0000 mg | ORAL_TABLET | ORAL | Status: DC
  Filled 2012-09-07: qty 1

## 2012-09-07 MED ORDER — DILTIAZEM HCL ER COATED BEADS 180 MG PO CP24
180.0000 mg | ORAL_CAPSULE | Freq: Every day | ORAL | Status: DC
  Administered 2012-09-07 – 2012-09-08 (×2): 180 mg via ORAL
  Filled 2012-09-07 (×3): qty 1

## 2012-09-07 MED ORDER — FUROSEMIDE 10 MG/ML IJ SOLN
40.0000 mg | Freq: Once | INTRAMUSCULAR | Status: AC
  Administered 2012-09-07: 40 mg via INTRAVENOUS
  Filled 2012-09-07: qty 4

## 2012-09-07 MED ORDER — ACETAMINOPHEN 325 MG PO TABS
650.0000 mg | ORAL_TABLET | Freq: Four times a day (QID) | ORAL | Status: DC | PRN
  Administered 2012-09-07 – 2012-09-23 (×21): 650 mg via ORAL
  Filled 2012-09-07 (×21): qty 2

## 2012-09-07 NOTE — Progress Notes (Signed)
Pt's temp 102.3. Pt tachycardic 130s-150s. Dr. Brien Few aware. Tylenol ordered. Verified with Lab no blood cultures done. Will order blood cultures per verbal order Dr. Brien Few. Will continue to monitor pt.

## 2012-09-07 NOTE — Consult Note (Addendum)
CARDIOLOGY CONSULT NOTE  Patient ID: [name] [name] MRN: [id number], DOB/AGE: 1956-06-06   Admit date: 09/06/2012 Date of Consult: 09/07/2012  Primary Physician: [name], MD Primary Cardiologist: Shela Commons. Allred, MD;   Pt. Profile  56 y/o male with h/o NICM who was admitted with PNA.  We've been asked to eval.  Problem List  Past Medical History  Diagnosis Date  . Chronic systolic dysfunction of left ventricle     EF 35-40% BY 2D ECHO,  . Hypertension   . AF (atrial fibrillation)     permanent, on coumadin  . Nonischemic cardiomyopathy   . Cardiac arrest 1998    ICD implanted at [facility] at that time  . CHF (congestive heart failure)     Past Surgical History  Procedure Date  . Cardiac defibrillator placement 1998  . 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  He was in his USOH until apporx 3 wks ago when he began to note progressive DOE and cough associated with generalized malaise.  He weighs himself @ home from time to time and notes that his weight has been relatively stable, around 260 lbs.  Despite progressive malaise, he has had stable 2-3 pillow orthopnea w/o PND.  Over the past 2-3 days, his dyspnea has worsened and he has been feverish with chills.  He has also noted decreased appetite.  He presented to the ED last night nad was found to be febrile with cxr showing multifocal pna.  WBC was elevated and he was also anemic.  He was admitted and placed on IV rocephin and zithromax.  This afternoon, he became acutely dyspneic and diaphoretic with frothy secretions.  He was felt to have acute pulmonary edema and was successfully treated with IV lasix.  CXR again shows pna/multilobar infxn.  Pro-BNP is elevated >5000.  Troponin is nl.  Pt currently is feeling  better w/o dyspnea @ rest.  He denies chest pain.  His weight is 255 on hospital scales.  Inpatient Medications    . acetaminophen  650 mg Oral Once  . azithromycin (ZITHROMAX) 500 MG IVPB  500 mg Intravenous Once  . azithromycin  500 mg Intravenous Q24H  . cefTRIAXone (ROCEPHIN)  IV  1 g Intravenous Once  . cefTRIAXone (ROCEPHIN)  IV  1 g Intravenous Q24H  . diltiazem  180 mg Oral Daily  . feeding supplement  1 Container Oral TID BM  . furosemide      . furosemide  40 mg Intravenous Once  . furosemide  40 mg Intravenous STAT  . furosemide  40 mg Intravenous BID  . guaiFENesin  600 mg Oral BID  . levalbuterol  0.63 mg Nebulization Q6H  . metoprolol  100 mg Oral BID  . pantoprazole (PROTONIX) IV  40 mg Intravenous Q24H  . phytonadione (VITAMIN K) IV  5 mg Intravenous Once  . pneumococcal 23 valent vaccine  0.5 mL Intramuscular Tomorrow-1000  . pravastatin  40 mg Oral QPC supper  . sodium chloride  1,000 mL Intravenous Once  . sodium chloride  3 mL Intravenous Q12H  . DISCONTD: azithromycin  500 mg Oral Q24H  . DISCONTD: furosemide  20 mg Intravenous Once  . DISCONTD: pantoprazole (PROTONIX) IV  40 mg Intravenous Q12H  . DISCONTD:  simvastatin  20 mg Oral q1800   Family History Family History  Problem Relation Age of Onset  . Diabetes Mother     died @ 41.  . Diabetes Brother     this brother also has htn  . Hypertension Father     alive @ 80.  Marland Kitchen Hypertension Brother   . Alzheimer's disease Father     Social History History   Social History  . Marital Status: Single    Spouse Name: N/A    Number of Children: N/A  . Years of Education: N/A   Occupational History  . [employer]    Social History Main Topics  . Smoking status: Former Smoker -- 0.8 packs/day for 6 years    Types: Cigarettes    Quit date: 11/15/1996  . Smokeless tobacco: Never Used  . Alcohol Use: No  . Drug Use: Yes    Special: Marijuana     many years ago  . Sexually Active: No   Other Topics  Concern  . Not on file   Social History Narrative   Lives in [place] with his father and 2 brothers.    Weight: Progressive decline from a peak of 296 pounds nearly 18 months ago to a value today of 255 pounds.  I&O has been approximately balanced since admission 2 days ago.  Review of Systems  General:  +++ chills/fevers with diaphoresis.  No night sweats or weight changes.  Cardiovascular:  +++  dyspnea on exertion.  He has chronic 2-3 pillow orthopnea.  No chest pain, edema, palpitations, paroxysmal nocturnal dyspnea. Dermatological: No rash, lesions/masses Respiratory: +++ cough, dyspnea Urologic: No hematuria, dysuria Abdominal:   No nausea, vomiting, diarrhea, bright red blood per rectum, melena, or hematemesis Neurologic:  No visual changes, wkns, changes in mental status. All other systems reviewed and are otherwise negative except as noted above.  Physical Exam  Blood pressure 125/58, pulse 128, temperature 98.5 F (36.9 C), temperature source Oral, resp. rate 32, height 6\' 5"  (1.956 m), weight 255 lb 4.7 oz (115.8 kg), SpO2 96.00%.  General: Pleasant, NAD Psych: Normal affect. Neuro: Alert and oriented X 3. Moves all extremities spontaneously. HEENT: Normal  Neck: Supple without bruits.  JVP ~ 10 cm.-> Mild JVD Lungs:  Resp regular and unlabored, diminished with right midlung rales; prolonged expiratory phase Heart: Rapid and irregular rhythm; no s3, s4, or murmurs; split S1 Abdomen: Soft, non-tender, non-distended, BS + x 4.  Extremities: No clubbing, cyanosis or edema. DP/PT/Radials 2+ and equal bilaterally.  Labs  [facility] 09/07/12 1634  CKTOTAL --  CKMB --  TROPONINI <0.30   Lab Results  Component Value Date   WBC 12.9* 09/07/2012   HGB 8.6* 09/07/2012   HCT 25.5* 09/07/2012   MCV 79.9 09/07/2012   PLT 127* 09/07/2012     Lab 09/07/12 1645 09/07/12 0711  NA 131* --  K 4.2 --  CL 103 --  CO2 17* --  BUN 32* --  CREATININE 1.25 --  CALCIUM 8.3*  --  PROT -- 7.7  BILITOT -- 1.9*  ALKPHOS -- 76  ALT -- 23  AST -- 43*  GLUCOSE 137* --   Radiology/Studies  Dg Chest x-ray  09/06/2012-  Cardiomegaly with central vascular congestion.  Patchy airspace opacities are nonspecific.  Favored to reflect multifocal pneumonia versus pulmonary edema. Dg                           09/07/2012:  Stable cardiomegaly.  Left chest cardiac AICD.   Interval increased confluence of patchy right lung opacity.  Lesser left perihilar opacity is more stable.  Favor multilobar infection over asymmetric edema at this point.    ECG: Afib, 120, poor R-wave progression;  No acute ST-T wave abnormalities.  ASSESSMENT AND PLAN  1.  CAP:  Zithromax and rocephin per IM.  Fever has subsided for now.  2.  Acute on chronic systolic CHF:  Pt with mild volume overload on exam.  Agree with IV lasix tonight.  Continue to follow daily weights/I's & O's.  Last EF 35-40% by echo in 02/2012.  Cont bb/acei.  We will consider consolidation of bb to toprol xl.  3.  Chronic AFib:  Rate up in setting of #1.  Cont bb.  He is also on long term diltiazem therapy.  Though not ideal in setting of #2, he has tolerated well in the past.  Coumadin is currently on hold in setting of anemia of unknown origin.  He denies bleeding/melena.  FOB pending.  4.  Normocytic anemia:  FOB pending.  Per IM.  5.  HTN: stable.  Nicolasa Ducking, NP 09/07/2012, 8:10 PM  Cardiology Attending Patient interviewed and examined. Discussed with Nicolasa Ducking, NP.  Above note annotated and modified based upon my findings.  Patient's problem is clearly pneumonia, which appears to be responding to antibiotic therapy. He reportedly had increased sputum production this morning and has moderately high BNP levels; appropriately, a question of superimposed congestive heart failure was raised. This was not verified radiographically.  Weights and I&O do not suggest fluid overload. No diagnostic findings for CHF by  exam.  We can add a stable oral dose of furosemide will continue treatment for pneumonia. Other cardiac medications will be optimized.  Hydralazine, nitrates and spironolactone will be added to medical regime.  He previously was treated with lisinopril, but that is not included him on current medications. It will be restarted at a dose of 20 mg per day.  Rate in atrial fibrillation is marginal with values of approximately 100 bpm. This should be adequate unless with activity, the right wrist is substantially. If additional rate control is necessary, treatment with digoxin can be considered.  Chronic anticoagulation: INR was therapeutic on admission, but warfarin is not included in current medication list. Treatment with this medication should be continued, since the risk of thromboembolism is substantial.  [place] Bing, MD 09/07/2012, 9:57 PM

## 2012-09-07 NOTE — H&P (Signed)
PCP:    [name], MD in [place]   Chief Complaint:   Not feeling too good  HPI: [name] [name] is a 56 y.o. male   has a past medical history of Chronic systolic dysfunction of left ventricle; Hypertension; AF (atrial fibrillation); Nonischemic cardiomyopathy; Cardiac arrest (1998); and CHF (congestive heart failure).   Presented with  Feeling bad for the past month. Denies chest pain or shortness of breath but does state he gets extra tired walking up and down the stairs. He have not noticed any blood in stool no black stool. HE have had occasional chills. He have had some cough for the past 2-3 weeks. He mainly have had clear sputum but yesterday was red tinged.   Review of Systems:    Pertinent positives include: Fevers, chills, fatigue, dyspnea on exertion,  productive cough,   Constitutional:  No weight loss, night sweats, weight loss  HEENT:  No headaches, Difficulty swallowing,Tooth/dental problems,Sore throat,  No sneezing, itching, ear ache, nasal congestion, post nasal drip,  Cardio-vascular:  No chest pain, Orthopnea, PND, anasarca, dizziness, palpitations.no Bilateral lower extremity swelling  GI:  No heartburn, indigestion, abdominal pain, nausea, vomiting, diarrhea, change in bowel habits, loss of appetite, melena, blood in stool, hematemesis Resp:  no shortness of breath at rest. No No excess mucus, noNo non-productive cough, No coughing up of blood.No change in color of mucus.No wheezing. Skin:  no rash or lesions. No jaundice GU:  no dysuria, change in color of urine, no urgency or frequency. No straining to urinate.  No flank pain.  Musculoskeletal:  No joint pain or no joint swelling. No decreased range of motion. No back pain.  Psych:  No change in mood or affect. No depression or anxiety. No memory loss.  Neuro: no localizing neurological complaints, no tingling, no weakness, no double vision, no gait abnormality, no slurred speech, no  confusion  Otherwise ROS are negative except for above, 10 systems were reviewed  Past Medical History: Past Medical History  Diagnosis Date  . Chronic systolic dysfunction of left ventricle     EF 35-40% BY 2D ECHO,  . Hypertension   . AF (atrial fibrillation)     permanent, on coumadin  . Nonischemic cardiomyopathy   . Cardiac arrest 1998    ICD implanted at [facility] at that time  . CHF (congestive heart failure)    Past Surgical History  Procedure Date  . Cardiac defibrillator placement 1998  . 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 Authorizing Provider  Cholecalciferol (VITAMIN D3) 2000 UNITS capsule Take 2,000 Units by mouth daily.     Yes Historical Provider, MD  diltiazem (CARDIZEM CD) 180 MG 24 hr capsule Take 180 mg by mouth daily. 02/24/12  Yes June Leap, MD  finasteride (PROSCAR) 5 MG tablet Take 5 mg by mouth daily.    Yes Historical Provider, MD  lisinopril (PRINIVIL,ZESTRIL) 10 MG tablet Take 1 tablet (10 mg total) by mouth daily. 05/22/12  Yes June Leap, MD  metoprolol (LOPRESSOR) 100 MG tablet Take 1 tablet (100 mg total) by mouth 2 (two) times daily. 02/24/12  Yes June Leap, MD  polyethylene glycol powder [facility]) powder Take 17 g by mouth daily as needed. For  constipation - mix with 8 oz liquid and drink 04/19/12  Yes Historical Provider, MD  potassium chloride (MICRO-K) 10 MEQ CR capsule Take 1 capsule (10 mEq total) by mouth daily. 02/24/12  Yes June Leap, MD  pravastatin (PRAVACHOL) 40 MG tablet Take 40 mg by mouth at bedtime.    Yes Historical Provider, MD  warfarin (COUMADIN) 2 MG tablet Take 2 mg by mouth See admin instructions. Take with 5 mg tablet on Saturday, Sunday, Monday, Tuesday and Thursday   Yes Historical  Provider, MD  warfarin (COUMADIN) 3 MG tablet Take 3 mg by mouth See admin instructions. Take with 5 mg tablet on Wednesday and Friday   Yes Historical Provider, MD  warfarin (COUMADIN) 5 MG tablet Take 5 mg by mouth See admin instructions. On Wednesday and Friday, take with 3 mg tablet for an 8 mg dose.  On all other days take with 2 mg tablet for a 7 mg dose   Yes Historical Provider, MD    Allergies:  No Known Allergies  Social History:  Ambulatory  independently Lives at  Home with family   reports that he quit smoking about 15 years ago. His smoking use included Cigarettes. He has a 4.8 pack-year smoking history. He has never used smokeless tobacco. He reports that he uses illicit drugs (Marijuana). He reports that he does not drink alcohol.   Family History: family history includes Diabetes in his brother, mother, and unspecified family member.    Physical Exam: Patient Vitals for the past 24 hrs:  BP Temp Temp src Pulse Resp SpO2  09/07/12 0019 114/64 mmHg 98.5 F (36.9 C) Oral 90  19  100 %  09/06/12 2316 - - - - - 99 %  09/06/12 2127 116/66 mmHg 101.3 F (38.5 C) Oral 117  20  97 %    1. General:  in No Acute distress 2. Psychological: Alert and   Oriented 3. Head/ENT:    Dry Mucous Membranes                          Head Non traumatic, neck supple                           Poor Dentition 4. SKIN: normal   Skin turgor,  Skin clean Dry and intact no rash 5. Heart: Regular rate and rhythm no Murmur, Rub or gallop 6. Lungs:  no wheezes but occasional crackles, bronchial air sounds at the bases   7. Abdomen: Soft, some epigastric tenderness, Non distended 8. Lower extremities: no clubbing, cyanosis, or edema 9. Neurologically Grossly intact, moving all 4 extremities equally 10. MSK: Normal range of motion  body mass index is unknown because there is no height or weight on file.   Labs on Admission:   [facility] 09/06/12 2207  NA 130*  K 4.3  CL 100  CO2 17*   GLUCOSE 109*  BUN 32*  CREATININE 1.44*  CALCIUM 8.6  MG --  PHOS --   No results found for this basename: AST:2,ALT:2,ALKPHOS:2,BILITOT:2,PROT:2,ALBUMIN:2 in the last 72 hours No results found for this basename: LIPASE:2,AMYLASE:2 in the last 72 hours  Basename 09/06/12 2207  WBC 12.0*  NEUTROABS --  HGB 7.5*  HCT 22.8*  MCV 80.3  PLT 149*   No results found for this basename: CKTOTAL:3,CKMB:3,CKMBINDEX:3,TROPONINI:3 in the last 72 hours No results found for this basename: TSH,T4TOTAL,FREET3,T3FREE,THYROIDAB in the last 72  hours  Basename 09/06/12 2321  VITAMINB12 --  FOLATE --  FERRITIN --  TIBC --  IRON --  RETICCTPCT 2.7   No results found for this basename: HGBA1C    The CrCl is unknown because both a height and weight (above a minimum accepted value) are required for this calculation. ABG No results found for this basename: phart, pco2, po2, hco3, tco2, acidbasedef, o2sat     No results found for this basename: DDIMER     Other results:  I have pearsonaly reviewed this: ECG REPORT  Rate: 120  Rhythm: atrial fibrilation ST&T Change: no ischemic changes  UA hematouria   Cultures: No results found for this basename: sdes, specrequest, cult, reptstatus       Radiological Exams on Admission: Dg Chest 2 View  09/06/2012  *RADIOLOGY REPORT*  Clinical Data: Shortness of breath  CHEST - 2 VIEW  Comparison: 05/22/2010  Findings: Cardiomegaly.  Central vascular congestion. Changed left chest wall battery pack with additional lead tip since the prior. Bilateral patchy airspace opacities, right greater than left.  No pleural effusion.  No pneumothorax.  No acute osseous finding. There is a rounded calcific density projecting over the left upper quadrant, perhaps a splenic cyst, similar to prior.  IMPRESSION: Cardiomegaly with central vascular congestion.  Patchy airspace opacities are nonspecific.  Favored to reflect multifocal pneumonia versus pulmonary edema.  Recommend radiograph follow-up after appropriate management to document resolution.   Original Report Authenticated By: Waneta Martins, M.D.     Chart has been reviewed  Assessment/Plan  56 yo W hx of CHF and atrial fibrillation on coumadin here with anemia and PNA  Present on Admission:  .Pneumonia - CAP will start on rocephin and zithromycin for now, minimal blood tinged sputum X 1 likely due to tracheal irritation but will continue to monitor.  .Anemia - will obtain hemoccult stool serially, follow CBC after transfusion, if evidence of iron deficiency would get GI consult, anemia panel pending. Will give protonix IV given epigastric tenderness.  Marland KitchenHYPERTENSION, UNSPECIFIED - continue metoprolol .SYSTOLIC HEART FAILURE, CHRONIC - avoid fluid overload .Atrial fibrillation - hold coumadin for now until able to assess if there is any GI blood loss. Marland KitchenHyponatremia - in the setting of PNA, will obtain urine electrolytes, TSH   Prophylaxis: SCD  Protonix  CODE STATUS: FULL CODE  Other plan as per orders.  I have spent a total of 55 min on this admission  Jennings Stirling 09/07/2012, 1:10 AM

## 2012-09-07 NOTE — Progress Notes (Signed)
INITIAL ADULT NUTRITION ASSESSMENT Date: 09/07/2012   Time: 10:49 AM  INTERVENTION:  Resource Breeze supplement 3 times daily (250 kcals, 9 gm protein per 8 fl oz carton) RD to follow for nutrition care plan  DOCUMENTATION CODES Per approved criteria  -Obesity Unspecified   Reason for Assessment: Malnutrition Screening Tool Report  ASSESSMENT: Male 56 y.o.  Dx: pneumonia  Hx:  Past Medical History  Diagnosis Date  . Chronic systolic dysfunction of left ventricle     EF 35-40% BY 2D ECHO,  . Hypertension   . AF (atrial fibrillation)     permanent, on coumadin  . Nonischemic cardiomyopathy   . Cardiac arrest 1998    ICD implanted at [facility] at that time  . CHF (congestive heart failure)     Related Meds:     . acetaminophen  650 mg Oral Once  . azithromycin (ZITHROMAX) 500 MG IVPB  500 mg Intravenous Once  . azithromycin  500 mg Intravenous Q24H  . cefTRIAXone (ROCEPHIN)  IV  1 g Intravenous Once  . cefTRIAXone (ROCEPHIN)  IV  1 g Intravenous Q24H  . diltiazem  180 mg Oral Daily  . furosemide      . furosemide  40 mg Intravenous Once  . guaiFENesin  600 mg Oral BID  . levalbuterol  0.63 mg Nebulization Q6H  . metoprolol  100 mg Oral BID  . pantoprazole (PROTONIX) IV  40 mg Intravenous Q24H  . phytonadione (VITAMIN K) IV  5 mg Intravenous Once  . pneumococcal 23 valent vaccine  0.5 mL Intramuscular Tomorrow-1000  . simvastatin  20 mg Oral q1800  . sodium chloride  1,000 mL Intravenous Once  . sodium chloride  3 mL Intravenous Q12H  . DISCONTD: azithromycin  500 mg Oral Q24H  . DISCONTD: furosemide  20 mg Intravenous Once  . DISCONTD: pantoprazole (PROTONIX) IV  40 mg Intravenous Q12H    Ht: 6\' 5"  (195.6 cm)  Wt: 255 lb 4.7 oz (115.8 kg)  Ideal Wt: 94.5 kg % Ideal Wt: 122%  Usual Wt: 268 lb -- June 2013 % Usual Wt: 95%  Body mass index is 30.27 kg/(m^2).  Food/Nutrition Related Hx: recent weight lost without trying and decreased appetite per  admission nutrition screen  Labs:  CMP     Component Value Date/Time   NA 132* 09/07/2012 0711   K 4.3 09/07/2012 0711   CL 103 09/07/2012 0711   CO2 20 09/07/2012 0711   GLUCOSE 106* 09/07/2012 0711   BUN 32* 09/07/2012 0711   CREATININE 1.25 09/07/2012 0711   CALCIUM 8.5 09/07/2012 0711   PROT 7.7 09/07/2012 0711   ALBUMIN 2.2* 09/07/2012 0711   AST 43* 09/07/2012 0711   ALT 23 09/07/2012 0711   ALKPHOS 76 09/07/2012 0711   BILITOT 1.9* 09/07/2012 0711   GFRNONAA 64* 09/07/2012 0711   GFRAA 74* 09/07/2012 0711     Intake/Output Summary (Last 24 hours) at 09/07/12 1050 Last data filed at 09/07/12 1017  Gross per 24 hour  Intake 692.16 ml  Output    850 ml  Net -157.84 ml    Diet Order: Clear Liquid  Supplements/Tube Feeding: N/A  IVF: N/A  Estimated Nutritional Needs:   Kcal: 2300-2400 Protein: 115-125 gm Fluid: 2.3-2.4 L  Patient admitted for "feeling bad for the past month;" has had a cough for the past 2-3 weeks; RD spoke with patient and patient's brother; patient reports he's had a poor appetite during this time frame; patient's PO intake with meals  PTA < 50% per brother.  Patient also endorses some weight loss, however, unable to quantify amount -- per flowsheet records, patient has lost approximately 13 lbs since June (5%) -- not significant from time frame; patient at nutrition risk and would benefit from addition of nutrition supplement -- amenable to [employer] -- RD to order.   NUTRITION DIAGNOSIS: -Inadequate protein energy intake (NI-5.3).  Status: Ongoing  RELATED TO: clear liquid diet  AS EVIDENCE BY: provision of ~ 1,000 kcals, 0 gm protein  MONITORING/EVALUATION(Goals): Goal: Oral intake with meals & supplements to meet >/= 90% of estimated nutrition needs Monitor: PO & supplemental intake, weight, labs, I/O's  EDUCATION NEEDS: -No education needs identified at this time  Dietitian #: [phone]  Kirkland Hun, RD, LDN Pager  #: [phone] After-Hours Pager #: [phone]

## 2012-09-07 NOTE — Progress Notes (Signed)
Pt's hgb 7.5 and has order for 2 units PRBC. 1 unit has transfused and Dr. Adela Glimpse requesting CBC be drawn before transfusing 2nd unit to see if pt needs it. Lab called and will be up. Will continue to monitor.

## 2012-09-07 NOTE — Progress Notes (Signed)
[facility] PROGRESS NOTE  [name] [name] ZOX:[id number] DOB: 05/15/1956 DOA: 09/06/2012 PCP: [name], MD  Assessment/Plan: Active Problems:  HYPERTENSION, UNSPECIFIED  Atrial fibrillation  SYSTOLIC HEART FAILURE, CHRONIC  Anemia  Pneumonia  Hyponatremia   1. Pneumonia: Patient presented with chills, ad a productive cough. CXR demonstrated bilateral patchy airspace opacities, right greater than left, and he had a leukocytosis of 12.0. He has been commenced on iv Rocephin/Aithromycin for CAP. Urinary S. Pneumoniae antigen is negative.  2. Anemia: Severe, normocytic. Patient denies black stools, hematochezia or hematemesis. FOBT is pending, as are hematinic studies. Meanwhile, transfusing PRBCs. Will follow CBC. On empiric Protonix. Anticoagulation is on hold. Will consult GI for work up, as the question of continuing anticoagulation, will need to be addressed. Will give  5 mg Vit K iv , as INR is supratherapeutic.  3. HTN: Controlled on pre-admission antihypertensives.  4. Chronic systolic CHF/NICM:  No clinical CHF at this time. Need to monitor volume status closely. Have informed Dr Jenel Lucks SVC of hospitalization, and ICD check has been arranged.  5. Atrial fibrillation: Rate-controlled on Metoprolol/Cardizem. As discussed in #2 above, Coumadin is on hold. 6. Hyponatremia: Mild, and likely due to CAP. TSH is pending.    Code Status: Full Code.  Family Communication:  Disposition Plan: To be determined.    Brief narrative: 56 y.o. Male with history of chronic systolic CHF, EF 81%-19%, HTN, AF on anticoagulation, nonischemic cardiomyopathy, cardiac arrest (1998), s/p AICD, feeling unwell for the past one month, with exertional dyspnea bad for the past month, without chest pain. He has had a cough for the past 2-3 weeks, mainly with clear sputum but was red tinged on 09/06/12.    Consultants:  N/A  Procedures:  CXR  Antibiotics:  Rocephin  09/06/12>>>  Azithromycin 09/06/12>>>  HPI/Subjective: No new issues.   Objective: Vital signs in last 24 hours: Temp:  [97.2 F (36.2 C)-101.3 F (38.5 C)] 97.7 F (36.5 C) (10/24 0800) Pulse Rate:  [64-120] 72  (10/24 0800) Resp:  [19-28] 23  (10/24 0800) BP: (100-128)/(60-74) 110/68 mmHg (10/24 0800) SpO2:  [97 %-100 %] 100 % (10/24 0800) Weight:  [115.8 kg (255 lb 4.7 oz)] 115.8 kg (255 lb 4.7 oz) (10/24 0200) Weight change:     Intake/Output from previous day: 10/23 0701 - 10/24 0700 In: 322.2 [I.V.:13; Blood:309.2] Out: 300 [Urine:300]     Physical Exam: General: Comfortable, alert, communicative, fully oriented, mildly short of breath at rest.  HEENT:  Moderate clinical pallor, no jaundice, no conjunctival injection or discharge. NECK:  Supple, JVP not seen, no carotid bruits, no palpable lymphadenopathy, no palpable goiter. CHEST:  Fine crackles left base, no wheeze. HEART:  Sounds 1 and 2 heard, normal, 1rregular, no murmurs. ABDOMEN:  Full, soft, no scars, non-tender, no palpable organomegaly, no palpable masses, normal bowel sounds. GENITALIA:  Not examined. LOWER EXTREMITIES:  No pitting edema, palpable peripheral pulses. MUSCULOSKELETAL SYSTEM:  Unremarkable. CENTRAL NERVOUS SYSTEM:  No focal neurologic deficit on gross examination.  Lab Results:  [facility] 09/07/12 0711 09/06/12 2207  WBC 7.5 12.0*  HGB 7.3* 7.5*  HCT 22.4* 22.8*  PLT PENDING 149*    Basename 09/06/12 2207  NA 130*  K 4.3  CL 100  CO2 17*  GLUCOSE 109*  BUN 32*  CREATININE 1.44*  CALCIUM 8.6   Recent Results (from the past 240 hour(s))  MRSA PCR SCREENING     Status: Normal   Collection Time   09/07/12  2:00 AM  Component Value Range Status Comment   MRSA by PCR NEGATIVE  NEGATIVE Final      Studies/Results: Dg Chest 2 View  09/06/2012  *RADIOLOGY REPORT*  Clinical Data: Shortness of breath  CHEST - 2 VIEW  Comparison: 05/22/2010  Findings: Cardiomegaly.  Central  vascular congestion. Changed left chest wall battery pack with additional lead tip since the prior. Bilateral patchy airspace opacities, right greater than left.  No pleural effusion.  No pneumothorax.  No acute osseous finding. There is a rounded calcific density projecting over the left upper quadrant, perhaps a splenic cyst, similar to prior.  IMPRESSION: Cardiomegaly with central vascular congestion.  Patchy airspace opacities are nonspecific.  Favored to reflect multifocal pneumonia versus pulmonary edema. Recommend radiograph follow-up after appropriate management to document resolution.   Original Report Authenticated By: Waneta Martins, M.D.     Medications: Scheduled Meds:   . acetaminophen  650 mg Oral Once  . azithromycin (ZITHROMAX) 500 MG IVPB  500 mg Intravenous Once  . azithromycin  500 mg Oral Q24H  . cefTRIAXone (ROCEPHIN)  IV  1 g Intravenous Once  . cefTRIAXone (ROCEPHIN)  IV  1 g Intravenous Q24H  . diltiazem  180 mg Oral Daily  . metoprolol  100 mg Oral BID  . pantoprazole (PROTONIX) IV  40 mg Intravenous Q12H  . pneumococcal 23 valent vaccine  0.5 mL Intramuscular Tomorrow-1000  . simvastatin  20 mg Oral q1800  . sodium chloride  1,000 mL Intravenous Once  . sodium chloride  3 mL Intravenous Q12H   Continuous Infusions:  PRN Meds:.sodium chloride, sodium chloride    LOS: 1 day   Monic Engelmann,CHRISTOPHER  [facility] Pager [phone]. If 8PM-8AM, please contact night-coverage at [email or web address], password [facility] 09/07/2012, 8:23 AM  LOS: 1 day

## 2012-09-07 NOTE — Progress Notes (Signed)
  Comment: Called to see patient. C/o SOB/Frothy phlegm.  Patient completed 2 units PRBC today.  O/E: Faint Wheeze, basal crackles.  No new findings otherwise.   A/P: Pulmonary Edema.  1. IV Lasix. 2. Continue Nebs. 3. Portable CXR. 4. Repeat CBC/Lytes.   Note: CXR confirmed pulmonary edema.   Will cycle CEs. perhaps, patient does need to have repeat 2 D echo. Will seek cardiology input.  GI consultation will be requested when more clinically stable. Hopefully, by 09/08/12.   C. Michaelle Bottomley. MD, FACP.

## 2012-09-07 NOTE — Progress Notes (Signed)
2nd IVT RN to assess/ attempt PIV without success.  Staff RN notified.

## 2012-09-08 DIAGNOSIS — I428 Other cardiomyopathies: Secondary | ICD-10-CM

## 2012-09-08 DIAGNOSIS — Z95810 Presence of automatic (implantable) cardiac defibrillator: Secondary | ICD-10-CM

## 2012-09-08 LAB — COMPREHENSIVE METABOLIC PANEL
Albumin: 2.1 g/dL — ABNORMAL LOW (ref 3.5–5.2)
BUN: 32 mg/dL — ABNORMAL HIGH (ref 6–23)
Calcium: 8.4 mg/dL (ref 8.4–10.5)
GFR calc Af Amer: 80 mL/min — ABNORMAL LOW (ref >90)
Glucose, Bld: 127 mg/dL — ABNORMAL HIGH (ref 70–99)
Potassium: 4.1 mEq/L (ref 3.5–5.1)
Sodium: 132 mEq/L — ABNORMAL LOW (ref 135–145)
Total Protein: 7.7 g/dL (ref 6.0–8.3)

## 2012-09-08 LAB — TYPE AND SCREEN
ABO/RH(D): O POS
Antibody Screen: NEGATIVE
Unit division: 0
Unit division: 0

## 2012-09-08 LAB — CBC
HCT: 24.8 % — ABNORMAL LOW (ref 39.0–52.0)
HCT: 25.4 % — ABNORMAL LOW (ref 39.0–52.0)
Hemoglobin: 8.4 g/dL — ABNORMAL LOW (ref 13.0–17.0)
Hemoglobin: 8.6 g/dL — ABNORMAL LOW (ref 13.0–17.0)
MCH: 27.4 pg (ref 26.0–34.0)
MCH: 27.4 pg (ref 26.0–34.0)
MCV: 81.1 fL (ref 78.0–100.0)
MCV: 80.9 fL (ref 78.0–100.0)
Platelets: 126 10*3/uL — ABNORMAL LOW (ref 150–400)
Platelets: 148 10*3/uL — ABNORMAL LOW (ref 150–400)
RBC: 3.18 MIL/uL — ABNORMAL LOW (ref 4.22–5.81)
RBC: 3.06 MIL/uL — ABNORMAL LOW (ref 4.22–5.81)
RBC: 3.14 MIL/uL — ABNORMAL LOW (ref 4.22–5.81)
RDW: 18.7 % — ABNORMAL HIGH (ref 11.5–15.5)
WBC: 11.3 10*3/uL — ABNORMAL HIGH (ref 4.0–10.5)
WBC: 11.5 10*3/uL — ABNORMAL HIGH (ref 4.0–10.5)
WBC: 14.8 10*3/uL — ABNORMAL HIGH (ref 4.0–10.5)

## 2012-09-08 LAB — LEGIONELLA ANTIGEN, URINE: Legionella Antigen, Urine: NEGATIVE

## 2012-09-08 LAB — MAGNESIUM: Magnesium: 2 mg/dL (ref 1.5–2.5)

## 2012-09-08 LAB — OCCULT BLOOD X 1 CARD TO LAB, STOOL: Fecal Occult Bld: NEGATIVE

## 2012-09-08 LAB — PRO B NATRIURETIC PEPTIDE: Pro B Natriuretic peptide (BNP): 6800 pg/mL — ABNORMAL HIGH (ref 0–125)

## 2012-09-08 LAB — PROTIME-INR: Prothrombin Time: 22.7 seconds — ABNORMAL HIGH (ref 11.6–15.2)

## 2012-09-08 MED ORDER — FUROSEMIDE 10 MG/ML IJ SOLN
40.0000 mg | Freq: Every day | INTRAMUSCULAR | Status: DC
  Administered 2012-09-09: 40 mg via INTRAVENOUS
  Filled 2012-09-08: qty 4

## 2012-09-08 MED ORDER — DILTIAZEM HCL 100 MG IV SOLR
5.0000 mg/h | INTRAVENOUS | Status: AC
  Administered 2012-09-08: 5 mg/h via INTRAVENOUS
  Administered 2012-09-09: 15 mg/h via INTRAVENOUS
  Administered 2012-09-10: 20 mg/h via INTRAVENOUS
  Administered 2012-09-10: 15 mg/h via INTRAVENOUS
  Administered 2012-09-10 – 2012-09-11 (×4): 20 mg/h via INTRAVENOUS
  Administered 2012-09-12: 15 mg/h via INTRAVENOUS
  Administered 2012-09-12 – 2012-09-13 (×2): 10 mg/h via INTRAVENOUS
  Filled 2012-09-08 (×14): qty 100

## 2012-09-08 MED ORDER — FUROSEMIDE 10 MG/ML IJ SOLN
40.0000 mg | Freq: Once | INTRAMUSCULAR | Status: AC
  Administered 2012-09-07: 40 mg via INTRAVENOUS
  Filled 2012-09-08: qty 4

## 2012-09-08 NOTE — Progress Notes (Signed)
ICD interrogation is reviewed and normal.  He has a single chamber device programmed VVI R waves are 4.42mV.  Impedance 418.  V threshold 1.25V @0 . .  Optivol reveals increased volume status since 08/04/12 and going.    No changes are made See paper chart for full details.

## 2012-09-08 NOTE — Progress Notes (Signed)
[facility] PROGRESS NOTE  [name] [name] JYN:[id number] DOB: [birth year] DOA: 09/06/2012 PCP: [name], MD  Assessment/Plan: Active Problems:  Atrial fibrillation  SYSTOLIC HEART FAILURE, CHRONIC  Anemia  Pneumonia  Cardiomyopathy, nonischemic  Hypertension   1. Pneumonia: Patient presented with chills, ad a productive cough. CXR demonstrated bilateral patchy airspace opacities, right greater than left, and he had a leukocytosis of 12.0. Managing with iv Rocephin/Aithromycin, now day # 3, for CAP. Urinary S. Pneumoniae/legionella antigen are negative. Patient spiked a fever of 102.7 on 09/07/12, and blood cultures are negative so far. Clinically he feels better today, with a low grade pyrexia and wcc is trending down nicely.  2. Anemia: Severe, normocytic. Patient denies black stools, hematochezia or hematemesis. FOBT is pending, hematinic studies have confirmed iron deficiency. Transfused 2 units PRBC on 09/07/12, with bump in HB to 8.4. Following CBC. On empiric Protonix. Anticoagulation is on hold. Have consulted GI for work up, as the question of continuing anticoagulation, will need to be addressed. 5 mg Vit K was given iv on 09/07/12, as INR was supratherapeutic. INR has drifted down.  3. HTN: Controlled on pre-admission antihypertensives.  4. Chronic systolic CHF/NICM:  Patient initially appeared compensated, but became acutely dyspneic, on 09/07/12, after completion of blood transfusion. CXR confirmed volume overload, and patient was managed aggressively with iv Lasix, with significant improvement. Dr Dietrich Pates provided cardiology consultation and has rationalized anti-failure therapy. ICD check has been arranged.  5. Atrial fibrillation: Rate-controlled on Metoprolol/Cardizem. As discussed in #2 above, Coumadin is on hold. Managing per cardiology.  6. Hyponatremia: Mild, and likely due to CAP. TSH is 2.973.    Code Status: Full Code.  Family Communication:  Disposition  Plan: To be determined.    Brief narrative: 56 y.o. Male with history of chronic systolic CHF, EF 86%-57%, HTN, AF on anticoagulation, nonischemic cardiomyopathy, cardiac arrest (1998), s/p AICD, feeling unwell for the past one month, with exertional dyspnea bad for the past month, without chest pain. He has had a cough for the past 2-3 weeks, mainly with clear sputum but was red tinged on 09/06/12.    Consultants:  N/A  Procedures:  CXR  Antibiotics:  Rocephin 09/06/12>>>  Azithromycin 09/06/12>>>  HPI/Subjective: No new issues.   Objective: Vital signs in last 24 hours: Temp:  [98.5 F (36.9 C)-102.7 F (39.3 C)] 100.8 F (38.2 C) (10/25 0953) Pulse Rate:  [83-128] 87  (10/25 0500) Resp:  [25-45] 31  (10/25 0500) BP: (108-139)/(58-103) 139/86 mmHg (10/25 0500) SpO2:  [96 %-100 %] 97 % (10/25 0738) Weight:  [114.8 kg (253 lb 1.4 oz)] 114.8 kg (253 lb 1.4 oz) (10/25 0500) Weight change: -1 kg (-2 lb 3.3 oz) Last BM Date: 09/07/12  Intake/Output from previous day: 10/24 0701 - 10/25 0700 In: 2010 [P.O.:1280; I.V.:80; Blood:350; IV Piggyback:300] Out: 2700 [Urine:2700]     Physical Exam: General: Comfortable in chair, alert, communicative, fully oriented, not short of breath at rest.  HEENT:  Moderate clinical pallor, no jaundice, no conjunctival injection or discharge. NECK:  Supple, JVP not seen, no carotid bruits, no palpable lymphadenopathy, no palpable goiter. CHEST:  Clinically clear to auscultation. HEART:  Sounds 1 and 2 heard, normal, irregular, no murmurs. ABDOMEN:  Full, soft, no scars, non-tender, no palpable organomegaly, no palpable masses, normal bowel sounds. GENITALIA:  Not examined. LOWER EXTREMITIES:  No pitting edema, palpable peripheral pulses. MUSCULOSKELETAL SYSTEM:  Unremarkable. CENTRAL NERVOUS SYSTEM:  No focal neurologic deficit on gross examination.  Lab Results:  Alvira Philips  09/08/12 1139 09/08/12 0306  WBC 11.5* 11.3*  HGB 8.4*  8.7*  HCT 24.8* 25.8*  PLT 128* 126*    Basename 09/08/12 0306 09/07/12 1645  NA 132* 131*  K 4.1 4.2  CL 102 103  CO2 20 17*  GLUCOSE 127* 137*  BUN 32* 32*  CREATININE 1.17 1.25  CALCIUM 8.4 8.3*   Recent Results (from the past 240 hour(s))  MRSA PCR SCREENING     Status: Normal   Collection Time   09/07/12  2:00 AM      Component Value Range Status Comment   MRSA by PCR NEGATIVE  NEGATIVE Final   CULTURE, BLOOD (ROUTINE X 2)     Status: Normal (Preliminary result)   Collection Time   09/07/12  4:35 PM      Component Value Range Status Comment   Specimen Description BLOOD LEFT ARM   Final    Special Requests BOTTLES DRAWN AEROBIC AND ANAEROBIC 10CC   Final    Culture  Setup Time 09/07/2012 22:26   Final    Culture     Final    Value:        BLOOD CULTURE RECEIVED NO GROWTH TO DATE CULTURE WILL BE HELD FOR 5 DAYS BEFORE ISSUING A FINAL NEGATIVE REPORT   Report Status PENDING   Incomplete   CULTURE, BLOOD (ROUTINE X 2)     Status: Normal (Preliminary result)   Collection Time   09/07/12  4:45 PM      Component Value Range Status Comment   Specimen Description BLOOD RIGHT HAND   Final    Special Requests BOTTLES DRAWN AEROBIC AND ANAEROBIC 10CC   Final    Culture  Setup Time 09/07/2012 22:26   Final    Culture     Final    Value:        BLOOD CULTURE RECEIVED NO GROWTH TO DATE CULTURE WILL BE HELD FOR 5 DAYS BEFORE ISSUING A FINAL NEGATIVE REPORT   Report Status PENDING   Incomplete      Studies/Results: Dg Chest 2 View  09/06/2012  *RADIOLOGY REPORT*  Clinical Data: Shortness of breath  CHEST - 2 VIEW  Comparison: 05/22/2010  Findings: Cardiomegaly.  Central vascular congestion. Changed left chest wall battery pack with additional lead tip since the prior. Bilateral patchy airspace opacities, right greater than left.  No pleural effusion.  No pneumothorax.  No acute osseous finding. There is a rounded calcific density projecting over the left upper quadrant, perhaps a  splenic cyst, similar to prior.  IMPRESSION: Cardiomegaly with central vascular congestion.  Patchy airspace opacities are nonspecific.  Favored to reflect multifocal pneumonia versus pulmonary edema. Recommend radiograph follow-up after appropriate management to document resolution.   Original Report Authenticated By: Waneta Martins, M.D.    Dg Chest Port 1 View  09/07/2012  *RADIOLOGY REPORT*  Clinical Data: 56 year old male respiratory distress, right lower chest pain and productive cough.  PORTABLE CHEST - 1 VIEW  Comparison: 09/06/2012 and earlier.  Findings: Semi a upright AP portable view 1412 hours.  Stable cardiomegaly.  Left chest cardiac AICD again noted.  No definite effusion.  Patchy right lung opacity has increased in confluent. Less pronounced left perihilar opacity is stable or slightly increased.  No pneumothorax. Visualized tracheal air column is within normal limits.  IMPRESSION: Interval increased confluence of patchy right lung opacity.  Lesser left perihilar opacity is more stable.  Favor multilobar infection over asymmetric edema at this point.   Original  Report Authenticated By: Harley Hallmark, M.D.     Medications: Scheduled Meds:    . azithromycin  500 mg Intravenous Q24H  . cefTRIAXone (ROCEPHIN)  IV  1 g Intravenous Q24H  . diltiazem  180 mg Oral Daily  . feeding supplement  1 Container Oral TID BM  . furosemide      . furosemide  40 mg Intravenous STAT  . furosemide  40 mg Oral Daily  . guaiFENesin  600 mg Oral BID  . hydrALAZINE  50 mg Oral TID  . isosorbide mononitrate  60 mg Oral Daily  . levalbuterol  0.63 mg Nebulization Q6H  . lisinopril  20 mg Oral Daily  . metoprolol  100 mg Oral BID  . pantoprazole (PROTONIX) IV  40 mg Intravenous Q24H  . phytonadione (VITAMIN K) IV  5 mg Intravenous Once  . pneumococcal 23 valent vaccine  0.5 mL Intramuscular Tomorrow-1000  . pravastatin  40 mg Oral QPC supper  . sodium chloride  3 mL Intravenous Q12H  .  spironolactone  25 mg Oral Daily  . DISCONTD: furosemide  40 mg Intravenous BID  . DISCONTD: simvastatin  20 mg Oral q1800   Continuous Infusions:  PRN Meds:.sodium chloride, acetaminophen, levalbuterol, sodium chloride    LOS: 2 days   Kenna Kirn,CHRISTOPHER  [facility] Pager [phone]. If 8PM-8AM, please contact night-coverage at [email or web address], password [facility] 09/08/2012, 12:45 PM  LOS: 2 days

## 2012-09-08 NOTE — Progress Notes (Signed)
Rt was called to see patient by RN for a prn treatment due to patient spiking a temperature, de saturating and having increased respiratory rate. Patient was given a prn nebulizer treatment. Respiratory rate and oxygen saturations have improved after treatment. RN is calling MD to notify him of the patients change in status and RT will continue to monitor. Patient is in no acute respiratory distress currently and is maintaining on 3 lpm with an Spo2 of 97%

## 2012-09-08 NOTE — Consult Note (Signed)
Reason for Consult: Anemia Referring Physician: [facility]  Manus Rudd HPI: This is a 56 year old male with CHF with an EF of 35-40% requiring anticoagulation who presented to the hospital with complaints of feeling poorly for a month.  Further work up revealed that he has a pneumonia.  During the work up he was also noted to have a normocytic anemia in the 7 range. No reports of melena or hematochezia.  He denies any issues with GERD, abdominal pain, nausea, vomiting, or any prior history of PUD.  He reports having a colonoscopy 8-9 months ago, but he cannot recall the physician.    Past Medical History  Diagnosis Date  . Cardiomyopathy, nonischemic     EF 35-40% BY 2D ECHO, h/o clinical congestive heart failure; h/o cardiac arrest for which an ICD was implanted in 1998  . Hypertension   . AF (atrial fibrillation)     permanent, on coumadin  . Cardiac arrest 1998    ICD implanted at [facility] at that time  . Tobacco abuse, in remission     20 pack years or less; discontinued in 1998    Past Surgical History  Procedure Date  . Cardiac defibrillator placement 1998  . 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 Diabetes Mother     died @ 54.  . Diabetes Brother     this brother also has htn  . Hypertension Father     alive @ 33.  Marland Kitchen Hypertension Brother   . Alzheimer's disease Father     Social History:  reports that he quit smoking about 15 years ago. His smoking use included Cigarettes. He has a 4.8 pack-year smoking history. He has never used smokeless tobacco. He reports that he uses illicit drugs (Marijuana). He reports that he does not drink alcohol.  Allergies: No Known Allergies  Medications:  Scheduled:   . azithromycin  500 mg Intravenous Q24H  . cefTRIAXone (ROCEPHIN)  IV  1 g  Intravenous Q24H  . diltiazem  180 mg Oral Daily  . feeding supplement  1 Container Oral TID BM  . furosemide      . furosemide  40 mg Oral Daily  . guaiFENesin  600 mg Oral BID  . hydrALAZINE  50 mg Oral TID  . isosorbide mononitrate  60 mg Oral Daily  . levalbuterol  0.63 mg Nebulization Q6H  . lisinopril  20 mg Oral Daily  . metoprolol  100 mg Oral BID  . pantoprazole (PROTONIX) IV  40 mg Intravenous Q24H  . pneumococcal 23 valent vaccine  0.5 mL Intramuscular Tomorrow-1000  . pravastatin  40 mg Oral QPC supper  . sodium chloride  3 mL Intravenous Q12H  . spironolactone  25 mg Oral Daily  . DISCONTD: furosemide  40 mg Intravenous BID   Continuous:   Results for orders placed during the hospital encounter of 09/06/12 (from the past 24 hour(s))  CBC     Status: Abnormal   Collection Time   09/07/12  8:57 PM      Component Value Range   WBC 13.4 (*) 4.0 - 10.5 K/uL   RBC 3.22 (*) 4.22 - 5.81 MIL/uL   Hemoglobin 8.8 (*) 13.0 - 17.0 g/dL   HCT 16.1 (*) 09.6 - 04.5 %   MCV  80.1  78.0 - 100.0 fL   MCH 27.3  26.0 - 34.0 pg   MCHC 34.1  30.0 - 36.0 g/dL   RDW 98.1 (*) 19.1 - 47.8 %   Platelets 126 (*) 150 - 400 K/uL  TROPONIN I     Status: Normal   Collection Time   09/07/12  8:57 PM      Component Value Range   Troponin I <0.30  <0.30 ng/mL  TROPONIN I     Status: Normal   Collection Time   09/08/12  3:05 AM      Component Value Range   Troponin I <0.30  <0.30 ng/mL  CBC     Status: Abnormal   Collection Time   09/08/12  3:06 AM      Component Value Range   WBC 11.3 (*) 4.0 - 10.5 K/uL   RBC 3.18 (*) 4.22 - 5.81 MIL/uL   Hemoglobin 8.7 (*) 13.0 - 17.0 g/dL   HCT 29.5 (*) 62.1 - 30.8 %   MCV 81.1  78.0 - 100.0 fL   MCH 27.4  26.0 - 34.0 pg   MCHC 33.7  30.0 - 36.0 g/dL   RDW 65.7 (*) 84.6 - 96.2 %   Platelets 126 (*) 150 - 400 K/uL  PROTIME-INR     Status: Abnormal   Collection Time   09/08/12  3:06 AM      Component Value Range   Prothrombin Time 22.7 (*) 11.6  - 15.2 seconds   INR 2.10 (*) 0.00 - 1.49  COMPREHENSIVE METABOLIC PANEL     Status: Abnormal   Collection Time   09/08/12  3:06 AM      Component Value Range   Sodium 132 (*) 135 - 145 mEq/L   Potassium 4.1  3.5 - 5.1 mEq/L   Chloride 102  96 - 112 mEq/L   CO2 20  19 - 32 mEq/L   Glucose, Bld 127 (*) 70 - 99 mg/dL   BUN 32 (*) 6 - 23 mg/dL   Creatinine, Ser 9.52  0.50 - 1.35 mg/dL   Calcium 8.4  8.4 - 84.1 mg/dL   Total Protein 7.7  6.0 - 8.3 g/dL   Albumin 2.1 (*) 3.5 - 5.2 g/dL   AST 35  0 - 37 U/L   ALT 21  0 - 53 U/L   Alkaline Phosphatase 77  39 - 117 U/L   Total Bilirubin 2.5 (*) 0.3 - 1.2 mg/dL   GFR calc non Af Amer 69 (*) >90 mL/min   GFR calc Af Amer 80 (*) >90 mL/min  PRO B NATRIURETIC PEPTIDE     Status: Abnormal   Collection Time   09/08/12  3:06 AM      Component Value Range   Pro B Natriuretic peptide (BNP) 6800.0 (*) 0 - 125 pg/mL  GLUCOSE, CAPILLARY     Status: Abnormal   Collection Time   09/08/12  8:33 AM      Component Value Range   Glucose-Capillary 107 (*) 70 - 99 mg/dL   Comment 1 Notify RN    CBC     Status: Abnormal   Collection Time   09/08/12 11:39 AM      Component Value Range   WBC 11.5 (*) 4.0 - 10.5 K/uL   RBC 3.06 (*) 4.22 - 5.81 MIL/uL   Hemoglobin 8.4 (*) 13.0 - 17.0 g/dL   HCT 32.4 (*) 40.1 - 02.7 %   MCV 81.0  78.0 - 100.0 fL  MCH 27.5  26.0 - 34.0 pg   MCHC 33.9  30.0 - 36.0 g/dL   RDW 78.4 (*) 69.6 - 29.5 %   Platelets 128 (*) 150 - 400 K/uL  MAGNESIUM     Status: Normal   Collection Time   09/08/12 11:39 AM      Component Value Range   Magnesium 2.0  1.5 - 2.5 mg/dL     Dg Chest 2 View  28/41/3244  *RADIOLOGY REPORT*  Clinical Data: Shortness of breath  CHEST - 2 VIEW  Comparison: 05/22/2010  Findings: Cardiomegaly.  Central vascular congestion. Changed left chest wall battery pack with additional lead tip since the prior. Bilateral patchy airspace opacities, right greater than left.  No pleural effusion.  No  pneumothorax.  No acute osseous finding. There is a rounded calcific density projecting over the left upper quadrant, perhaps a splenic cyst, similar to prior.  IMPRESSION: Cardiomegaly with central vascular congestion.  Patchy airspace opacities are nonspecific.  Favored to reflect multifocal pneumonia versus pulmonary edema. Recommend radiograph follow-up after appropriate management to document resolution.   Original Report Authenticated By: Waneta Martins, M.D.    Dg Chest Port 1 View  09/07/2012  *RADIOLOGY REPORT*  Clinical Data: 56 year old male respiratory distress, right lower chest pain and productive cough.  PORTABLE CHEST - 1 VIEW  Comparison: 09/06/2012 and earlier.  Findings: Semi a upright AP portable view 1412 hours.  Stable cardiomegaly.  Left chest cardiac AICD again noted.  No definite effusion.  Patchy right lung opacity has increased in confluent. Less pronounced left perihilar opacity is stable or slightly increased.  No pneumothorax. Visualized tracheal air column is within normal limits.  IMPRESSION: Interval increased confluence of patchy right lung opacity.  Lesser left perihilar opacity is more stable.  Favor multilobar infection over asymmetric edema at this point.   Original Report Authenticated By: Ulla Potash III, M.D.     ROS:  As stated above in the HPI otherwise negative.  Blood pressure 112/65, pulse 163, temperature 100.8 F (38.2 C), temperature source Oral, resp. rate 30, height 6\' 5"  (1.956 m), weight 114.8 kg (253 lb 1.4 oz), SpO2 98.00%.    PE: Gen: NAD, Alert and Oriented HEENT:  Box/AT, EOMI Neck: Supple, no LAD Lungs: CTA Bilaterally CV: Irregularly irregular ABM: Soft, NTND, +BS Ext: No C/C/E Rectal: Brown liquid stool.  Assessment/Plan: 1) Anemia. 2) CHF. 3) Chronic afib.   The patient has a normocytic anemia, but his iron saturation is low at 7%.  However, I am dubious that he has a GI source of bleeding.  The anemia has been ongoing for the  past several years.  He reports having a colonoscopy, but his description of the GI physician in [place] does not match anybody that is known to me.  I performed a rectal examination and sent the specimen card off for evaluation.  He is not in the best of health and he would be high risk for an EGD/Colonoscopy.  Plan: 1) Await hemoccult. 2) Continue with anticoagulation for now. 3) Follow HGB and transfuse as necessary.  Mychele Seyller D 09/08/2012, 5:28 PM

## 2012-09-08 NOTE — Progress Notes (Signed)
  Comment: Informed by RN, that patient is spiking pyrexia of 102.2, and HR up to the 130s. Also wheezy and desaturating. Responded to bronchodilator treatment and is currently and is maintaining on 3 lpm with an Spo2 of 97%.  Plan: 1. Switched to ivi Cardizem infusion, titrated against HR, for fast A.Fib. 2. Lasix 40 mg iv stat x 1 dose. 3. Change PO Lasix to iv. 4. Continue current antibiotics.  5. Continue prn Nebs, as well as scheduled nebs.   C. Doralee Kocak. MD, FACP.

## 2012-09-09 ENCOUNTER — Inpatient Hospital Stay (HOSPITAL_COMMUNITY): Payer: PRIVATE HEALTH INSURANCE

## 2012-09-09 DIAGNOSIS — N179 Acute kidney failure, unspecified: Secondary | ICD-10-CM

## 2012-09-09 DIAGNOSIS — I4891 Unspecified atrial fibrillation: Secondary | ICD-10-CM

## 2012-09-09 DIAGNOSIS — J189 Pneumonia, unspecified organism: Secondary | ICD-10-CM

## 2012-09-09 LAB — CBC
HCT: 24.5 % — ABNORMAL LOW (ref 39.0–52.0)
HCT: 23.7 % — ABNORMAL LOW (ref 39.0–52.0)
HCT: 22.4 % — ABNORMAL LOW (ref 39.0–52.0)
Hemoglobin: 8.2 g/dL — ABNORMAL LOW (ref 13.0–17.0)
Hemoglobin: 7.9 g/dL — ABNORMAL LOW (ref 13.0–17.0)
Hemoglobin: 7.9 g/dL — ABNORMAL LOW (ref 13.0–17.0)
MCH: 27.4 pg (ref 26.0–34.0)
MCH: 27.3 pg (ref 26.0–34.0)
MCH: 28 pg (ref 26.0–34.0)
MCHC: 33.8 g/dL (ref 30.0–36.0)
MCV: 82.3 fL (ref 78.0–100.0)
MCV: 80.6 fL (ref 78.0–100.0)
MCV: 83 fL (ref 78.0–100.0)
RBC: 2.97 MIL/uL — ABNORMAL LOW (ref 4.22–5.81)
RBC: 2.88 MIL/uL — ABNORMAL LOW (ref 4.22–5.81)
RDW: 18.9 % — ABNORMAL HIGH (ref 11.5–15.5)
RDW: 18.7 % — ABNORMAL HIGH (ref 11.5–15.5)
WBC: 13.4 10*3/uL — ABNORMAL HIGH (ref 4.0–10.5)
WBC: 12.8 10*3/uL — ABNORMAL HIGH (ref 4.0–10.5)

## 2012-09-09 LAB — POCT I-STAT 3, ART BLOOD GAS (G3+)
Acid-base deficit: 4 mmol/L — ABNORMAL HIGH (ref 0.0–2.0)
Bicarbonate: 18.4 mEq/L — ABNORMAL LOW (ref 20.0–24.0)
O2 Saturation: 96 %
Patient temperature: 98.7

## 2012-09-09 LAB — BASIC METABOLIC PANEL
BUN: 35 mg/dL — ABNORMAL HIGH (ref 6–23)
CO2: 20 mEq/L (ref 19–32)
Calcium: 8.5 mg/dL (ref 8.4–10.5)
Chloride: 100 mEq/L (ref 96–112)
Creatinine, Ser: 1.57 mg/dL — ABNORMAL HIGH (ref 0.50–1.35)
GFR calc Af Amer: 56 mL/min — ABNORMAL LOW (ref >90)
GFR calc non Af Amer: 49 mL/min — ABNORMAL LOW (ref >90)
Glucose, Bld: 111 mg/dL — ABNORMAL HIGH (ref 70–99)
Potassium: 4.4 mEq/L (ref 3.5–5.1)
Sodium: 131 mEq/L — ABNORMAL LOW (ref 135–145)

## 2012-09-09 LAB — MRSA PCR SCREENING: MRSA by PCR: NEGATIVE

## 2012-09-09 LAB — BLOOD GAS, ARTERIAL
Acid-base deficit: 4.9 mmol/L — ABNORMAL HIGH (ref 0.0–2.0)
O2 Saturation: 97.5 %
Patient temperature: 98.6

## 2012-09-09 LAB — MAGNESIUM: Magnesium: 2 mg/dL (ref 1.5–2.5)

## 2012-09-09 LAB — PROTIME-INR: INR: 2.06 — ABNORMAL HIGH (ref 0.00–1.49)

## 2012-09-09 LAB — TROPONIN I: Troponin I: <0.3 ng/mL (ref <0.30)

## 2012-09-09 MED ORDER — AMIODARONE HCL IN DEXTROSE 360-4.14 MG/200ML-% IV SOLN
60.0000 mg/h | INTRAVENOUS | Status: AC
  Filled 2012-09-09: qty 200

## 2012-09-09 MED ORDER — FUROSEMIDE 40 MG PO TABS
40.0000 mg | ORAL_TABLET | Freq: Every day | ORAL | Status: DC
  Administered 2012-09-09 – 2012-09-10 (×2): 40 mg via ORAL
  Filled 2012-09-09 (×5): qty 1

## 2012-09-09 MED ORDER — VANCOMYCIN HCL 1000 MG IV SOLR
1500.0000 mg | INTRAVENOUS | Status: DC
  Administered 2012-09-10 – 2012-09-11 (×2): 1500 mg via INTRAVENOUS
  Filled 2012-09-09 (×3): qty 1500

## 2012-09-09 MED ORDER — WARFARIN SODIUM 10 MG PO TABS
10.0000 mg | ORAL_TABLET | Freq: Once | ORAL | Status: AC
  Administered 2012-09-09: 10 mg via ORAL
  Filled 2012-09-09 (×2): qty 1

## 2012-09-09 MED ORDER — AMIODARONE LOAD VIA INFUSION
150.0000 mg | Freq: Once | INTRAVENOUS | Status: DC
  Filled 2012-09-09: qty 83.34

## 2012-09-09 MED ORDER — AMIODARONE HCL IN DEXTROSE 360-4.14 MG/200ML-% IV SOLN
30.0000 mg/h | INTRAVENOUS | Status: DC
  Filled 2012-09-09 (×4): qty 200

## 2012-09-09 MED ORDER — VANCOMYCIN HCL 1000 MG IV SOLR
2000.0000 mg | Freq: Once | INTRAVENOUS | Status: AC
  Administered 2012-09-09: 2000 mg via INTRAVENOUS
  Filled 2012-09-09 (×2): qty 2000

## 2012-09-09 MED ORDER — WARFARIN - PHARMACIST DOSING INPATIENT
Freq: Every day | Status: DC
  Administered 2012-09-10: 18:00:00

## 2012-09-09 MED ORDER — DEXTROSE 5 % IV SOLN
1.0000 g | Freq: Three times a day (TID) | INTRAVENOUS | Status: AC
  Administered 2012-09-09 – 2012-09-19 (×31): 1 g via INTRAVENOUS
  Filled 2012-09-09 (×32): qty 1

## 2012-09-09 NOTE — Progress Notes (Addendum)
ANTICOAGULATION CONSULT NOTE - Follow Up Consult  Pharmacy Consult for Coumadin Indication: atrial fibrillation  No Known Allergies  Patient Measurements: Height: 6\' 5"  (195.6 cm) Weight: 262 lb 5.6 oz (119 kg) IBW/kg (Calculated) : 89.1   Vital Signs: Temp: 98.2 F (36.8 C) (10/26 0800) Temp src: Oral (10/26 0800) BP: 117/57 mmHg (10/26 0800) Pulse Rate: 115  (10/26 0800)  Labs:  Basename 09/09/12 0354 09/08/12 2049 09/08/12 1139 09/08/12 0306 09/08/12 0305 09/07/12 2057 09/07/12 1645 09/07/12 1634 09/07/12 0711  HGB 8.2* 8.6* -- -- -- -- -- -- --  HCT 24.5* 25.4* 24.8* -- -- -- -- -- --  PLT 137* 148* 128* -- -- -- -- -- --  APTT -- -- -- -- -- -- -- -- --  LABPROT 22.4* -- -- 22.7* -- -- -- -- 33.2*  INR 2.06* -- -- 2.10* -- -- -- -- 3.51*  HEPARINUNFRC -- -- -- -- -- -- -- -- --  CREATININE 1.57* -- -- 1.17 -- -- 1.25 -- --  CKTOTAL -- -- -- -- -- -- -- -- --  CKMB -- -- -- -- -- -- -- -- --  TROPONINI -- -- -- -- <0.30 <0.30 -- <0.30 --    Estimated Creatinine Clearance: 76.9 ml/min (by C-G formula based on Cr of 1.57).   Medications:  Home regimen: 7mg  (5mg  tab + 2mg  tab) daily except 8mg  (5mg  + 3mg ) on Wed and Fri  Assessment: 56yom on coumadin pta for afib admitted with INR 3.19. Coumadin placed on hold 2/2 anemia and possible GI blood loss (reports of blood tinged sputum) with last dose taken 10/22. INR continued to rise and vitamin k 5mg  given 10/24. INR is now therapeutic at 2.06. GI recommends to continue anticoagulation for now so coumadin to resume today. Anticipate that he will need higher doses given vitamin k resistance. Hgb/platelts low but stable.  Goal of Therapy:  INR 2-3 Monitor platelets by anticoagulation protocol: Yes   Plan:  1) Coumadin 10mg  x 1 2) Daily INR  Fredrik Rigger 09/09/2012,11:47 AM

## 2012-09-09 NOTE — Progress Notes (Signed)
Patient Name: [name] [name]     NICM on  Guideline directed medical therapy  who was admitted with PNA; previous ICD for ACA (aborted cardiac arrest) .  Permanent AF  SUBJECTIVE: still sob but feeling better  Past Medical History  Diagnosis Date  . Cardiomyopathy, nonischemic     EF 35-40% BY 2D ECHO, h/o clinical congestive heart failure; h/o cardiac arrest for which an ICD was implanted in 1998  . Hypertension   . AF (atrial fibrillation)     permanent, on coumadin  . Cardiac arrest 1998    ICD implanted at [facility] at that time  . Tobacco abuse, in remission     20 pack years or less; discontinued in 1998    PHYSICAL EXAM Filed Vitals:   09/09/12 0524 09/09/12 0600 09/09/12 0800 09/09/12 0823  BP:  110/66 117/57   Pulse:  91 115   Temp:   98.2 F (36.8 C)   TempSrc:   Oral   Resp:  30 30   Height:      Weight:      SpO2: 95% 98% 98% 98%   Well developed and nourished in mild  acute distress HENT normal Neck supple with JVP-flat Carotids brisk and full without bruits Clear Regular rate and rhythm, no murmurs or gallops Abd-soft with active BS without hepatomegaly No Clubbing cyanosis edema Skin-warm and dry A & Oriented  Grossly normal sensory and motor function    TELEMETRY: Reviewed telemetry pt in nsr    Intake/Output Summary (Last 24 hours) at 09/09/12 1129 Last data filed at 09/09/12 0600  Gross per 24 hour  Intake 585.42 ml  Output    400 ml  Net 185.42 ml    LABS: Basic Metabolic Panel:  Lab 09/09/12 2130 09/08/12 1139 09/08/12 0306 09/07/12 1645 09/07/12 0711 09/06/12 2207  NA 131* -- 132* 131* 132* 130*  K 4.3 -- 4.1 4.2 4.3 4.3  CL 100 -- 102 103 103 100  CO2 19 -- 20 17* 20 17*  GLUCOSE 107* -- 127* 137* 106* 109*  BUN 35* -- 32* 32* 32* 32*  CREATININE 1.57* -- 1.17 1.25 1.25 1.44*  CALCIUM 8.4 -- 8.4 -- -- --  MG 2.0 2.0 -- -- -- --  PHOS -- -- -- -- -- --   Cardiac Enzymes:  Basename 09/08/12 0305 09/07/12 2057  09/07/12 1634  CKTOTAL -- -- --  CKMB -- -- --  CKMBINDEX -- -- --  TROPONINI <0.30 <0.30 <0.30   CBC:  Lab 09/09/12 0354 09/08/12 2049 09/08/12 1139 09/08/12 0306 09/07/12 2057 09/07/12 1645 09/07/12 0711  WBC 13.4* 14.8* 11.5* 11.3* 13.4* 12.9* 7.5  NEUTROABS -- -- -- -- -- -- 5.9  HGB 8.2* 8.6* 8.4* 8.7* 8.8* 8.6* 7.3*  HCT 24.5* 25.4* 24.8* 25.8* 25.8* 25.5* 22.4*  MCV 82.5 80.9 81.0 81.1 80.1 79.9 81.8  PLT 137* 148* 128* 126* 126* 127* 112*   PROTIME:  Basename 09/09/12 0354 09/08/12 0306 09/07/12 0711  LABPROT 22.4* 22.7* 33.2*  INR 2.06* 2.10* 3.51*   Liver Function Tests:  Basename 09/08/12 0306 09/07/12 0711  AST 35 43*  ALT 21 23  ALKPHOS 77 76  BILITOT 2.5* 1.9*  PROT 7.7 7.7  ALBUMIN 2.1* 2.2*   No results found for this basename: LIPASE:2,AMYLASE:2 in the last 72 hours BNP: BNP (last 3 results)  Basename 09/09/12 0354 09/08/12 0306 09/07/12 1634  PROBNP 4957.0* 6800.0* 5294.0*   D-Dimer: No results found for this basename: DDIMER:2  in the last 72 hours Hemoglobin A1C: No results found for this basename: HGBA1C in the last 72 hours Fasting Lipid Panel: No results found for this basename: CHOL,HDL,LDLCALC,TRIG,CHOLHDL,LDLDIRECT in the last 72 hours Thyroid Function Tests:  [facility] 09/07/12 0711  TSH 2.973  T4TOTAL --  T3FREE --  THYROIDAB --   Anemia Panel:  Basename 09/06/12 2321  VITAMINB12 597  FOLATE 3.7  FERRITIN 436*  TIBC 201*  IRON 15*  RETICCTPCT 2.7       ASSESSMENT AND PLAN:  Patient Active Hospital Problem List: Atrial fibrillation (07/29/2009)   SYSTOLIC HEART FAILURE, CHRONIC (07/29/2009)   Cardiomyopathy, nonischemic ()   Hypertension ()  Would change metoporlol to carvedilol for benefit for CHF and possible ICD shock reduction  Can be done as outpt Will change lasix IV/>>PO   Plan will be to followup [facility]    Signed, Sherryl Manges MD  09/09/2012

## 2012-09-09 NOTE — Progress Notes (Signed)
[facility] PROGRESS NOTE  [name] [name] ZOX:[id number] DOB: 31-Aug-1956 DOA: 09/06/2012 PCP: [name], MD  Assessment/Plan: Active Problems:  Atrial fibrillation  SYSTOLIC HEART FAILURE, CHRONIC  Anemia  Pneumonia  Cardiomyopathy, nonischemic  Hypertension   1. Pneumonia: Patient presented with chills, and a productive cough. CXR demonstrated bilateral patchy airspace opacities, right greater than left, and he had a leukocytosis of 12.0. Managing with iv Rocephin/Aithromycin, now day # 4, for CAP. Urinary S. Pneumoniae/legionella antigen are negative. Patient continues to spike a fever of 101-102.7, and blood cultures negative so far. Clinically he feels better today.  2. Anemia: Severe, normocytic. Patient denies black stools, hematochezia or hematemesis. FOBT is negative, although hematinic studies have confirmed iron deficiency. Transfused 2 units PRBC on 09/07/12, with bump in HB to 8.4. Following CBC, and hemoglobin has remained stable. On empiric Protonix. Dr Jeani Hawking provided GI consultation, and has opined that patient is currently high risk for EGD/Colonoscopy. Anticoagulation was initially placed on hold, against a background of supra-therapeutic INR. 5 mg Vit K was given iv on 09/07/12, and INR has drifted down. Per Dr Dietrich Pates, patient is at substantial risk for thrombembolism. As GI has okayed anticoagulation, Coumadin will be restarted today.  3. HTN: Controlled on pre-admission antihypertensives.  4. Chronic systolic CHF/NICM:  Patient initially appeared compensated, but became acutely dyspneic, on 09/07/12, after completion of blood transfusion. CXR confirmed volume overload, and patient was managed aggressively with iv Lasix, with significant improvement. Dr Dietrich Pates provided cardiology consultation and has rationalized anti-failure therapy. ICD check was carried out by Dr Hillis Range, electrophysiologist, on 09/08/12 . ICD interrogation was reviewed and  declared normal.  5. Atrial fibrillation: This was initially rate-controlled on Metoprolol/Cardizem. In PM of 09/08/12 however, HR climbed up into the 130s-140s, and patient became acutely SOB. He was placed on a Cardizem infusion, with satisfactory rate-control.  6. Hyponatremia: Mild, and likely due to CAP. TSH is 2.973.    Code Status: Full Code.  Family Communication:  Disposition Plan: To be determined.    Brief narrative: 56 y.o. Male with history of chronic systolic CHF, EF 81%-19%, HTN, AF on anticoagulation, nonischemic cardiomyopathy, cardiac arrest (1998), s/p AICD, feeling unwell for the past one month, with exertional dyspnea bad for the past month, without chest pain. He has had a cough for the past 2-3 weeks, mainly with clear sputum but was red tinged on 09/06/12.    Consultants:  N/A  Procedures:  CXR  Antibiotics:  Rocephin 09/06/12>>>  Azithromycin 09/06/12>>>  HPI/Subjective: Feels marginally better. Still spiking fever. .   Objective: Vital signs in last 24 hours: Temp:  [98.2 F (36.8 C)-101.8 F (38.8 C)] 98.2 F (36.8 C) (10/26 0800) Pulse Rate:  [45-154] 115  (10/26 0800) Resp:  [25-35] 30  (10/26 0800) BP: (84-143)/(46-92) 117/57 mmHg (10/26 0800) SpO2:  [90 %-100 %] 98 % (10/26 0823) Weight:  [119 kg (262 lb 5.6 oz)] 119 kg (262 lb 5.6 oz) (10/26 0517) Weight change: 4.2 kg (9 lb 4.2 oz) Last BM Date: 09/07/12  Intake/Output from previous day: 10/25 0701 - 10/26 0700 In: 585.4 [I.V.:45.4; IV Piggyback:300] Out: 400 [Urine:400]     Physical Exam: General: Comfortable, alert, communicative, fully oriented, not short of breath at rest.  HEENT:  Moderate clinical pallor, no jaundice, no conjunctival injection or discharge. NECK:  Supple, JVP not seen, no carotid bruits, no palpable lymphadenopathy, no palpable goiter. CHEST:  Clinically clear to auscultation. HEART:  Sounds 1 and 2 heard, normal, irregular,  no murmurs. ABDOMEN:  Full,  soft, no scars, non-tender, no palpable organomegaly, no palpable masses, normal bowel sounds. GENITALIA:  Not examined. LOWER EXTREMITIES:  No pitting edema, palpable peripheral pulses. MUSCULOSKELETAL SYSTEM:  Unremarkable. CENTRAL NERVOUS SYSTEM:  No focal neurologic deficit on gross examination.  Lab Results:  [facility] 09/09/12 0354 09/08/12 2049  WBC 13.4* 14.8*  HGB 8.2* 8.6*  HCT 24.5* 25.4*  PLT 137* 148*    Basename 09/09/12 0354 09/08/12 0306  NA 131* 132*  K 4.3 4.1  CL 100 102  CO2 19 20  GLUCOSE 107* 127*  BUN 35* 32*  CREATININE 1.57* 1.17  CALCIUM 8.4 8.4   Recent Results (from the past 240 hour(s))  MRSA PCR SCREENING     Status: Normal   Collection Time   09/07/12  2:00 AM      Component Value Range Status Comment   MRSA by PCR NEGATIVE  NEGATIVE Final   CULTURE, BLOOD (ROUTINE X 2)     Status: Normal (Preliminary result)   Collection Time   09/07/12  4:35 PM      Component Value Range Status Comment   Specimen Description BLOOD LEFT ARM   Final    Special Requests BOTTLES DRAWN AEROBIC AND ANAEROBIC 10CC   Final    Culture  Setup Time 09/07/2012 22:26   Final    Culture     Final    Value:        BLOOD CULTURE RECEIVED NO GROWTH TO DATE CULTURE WILL BE HELD FOR 5 DAYS BEFORE ISSUING A FINAL NEGATIVE REPORT   Report Status PENDING   Incomplete   CULTURE, BLOOD (ROUTINE X 2)     Status: Normal (Preliminary result)   Collection Time   09/07/12  4:45 PM      Component Value Range Status Comment   Specimen Description BLOOD RIGHT HAND   Final    Special Requests BOTTLES DRAWN AEROBIC AND ANAEROBIC 10CC   Final    Culture  Setup Time 09/07/2012 22:26   Final    Culture     Final    Value:        BLOOD CULTURE RECEIVED NO GROWTH TO DATE CULTURE WILL BE HELD FOR 5 DAYS BEFORE ISSUING A FINAL NEGATIVE REPORT   Report Status PENDING   Incomplete      Studies/Results: Dg Chest Port 1 View  09/07/2012  *RADIOLOGY REPORT*  Clinical Data: 56 year old male  respiratory distress, right lower chest pain and productive cough.  PORTABLE CHEST - 1 VIEW  Comparison: 09/06/2012 and earlier.  Findings: Semi a upright AP portable view 1412 hours.  Stable cardiomegaly.  Left chest cardiac AICD again noted.  No definite effusion.  Patchy right lung opacity has increased in confluent. Less pronounced left perihilar opacity is stable or slightly increased.  No pneumothorax. Visualized tracheal air column is within normal limits.  IMPRESSION: Interval increased confluence of patchy right lung opacity.  Lesser left perihilar opacity is more stable.  Favor multilobar infection over asymmetric edema at this point.   Original Report Authenticated By: Harley Hallmark, M.D.     Medications: Scheduled Meds:    . azithromycin  500 mg Intravenous Q24H  . cefTRIAXone (ROCEPHIN)  IV  1 g Intravenous Q24H  . feeding supplement  1 Container Oral TID BM  . furosemide  40 mg Intravenous Daily  . guaiFENesin  600 mg Oral BID  . hydrALAZINE  50 mg Oral TID  . isosorbide mononitrate  60  mg Oral Daily  . levalbuterol  0.63 mg Nebulization Q6H  . lisinopril  20 mg Oral Daily  . metoprolol  100 mg Oral BID  . pantoprazole (PROTONIX) IV  40 mg Intravenous Q24H  . pneumococcal 23 valent vaccine  0.5 mL Intramuscular Tomorrow-1000  . pravastatin  40 mg Oral QPC supper  . sodium chloride  3 mL Intravenous Q12H  . spironolactone  25 mg Oral Daily  . DISCONTD: diltiazem  180 mg Oral Daily  . DISCONTD: furosemide  40 mg Oral Daily   Continuous Infusions:    . diltiazem (CARDIZEM) infusion 5 mg/hr (09/09/12 0600)   PRN Meds:.sodium chloride, acetaminophen, levalbuterol, sodium chloride    LOS: 3 days   Gerald Honea,CHRISTOPHER  [facility] Pager [phone]. If 8PM-8AM, please contact night-coverage at [email or web address], password [facility] 09/09/2012, 11:27 AM  LOS: 3 days

## 2012-09-09 NOTE — Progress Notes (Signed)
ANTIBIOTIC CONSULT NOTE - INITIAL  Pharmacy Consult for Vancomycin, Cefepime Indication: pneumonia  No Known Allergies  Patient Measurements: Height: 6\' 5"  (195.6 cm) Weight: 262 lb 5.6 oz (119 kg) IBW/kg (Calculated) : 89.1   Vital Signs: Temp: 99.9 F (37.7 C) (10/26 2015) Temp src: Oral (10/26 2015) BP: 108/78 mmHg (10/26 1900) Pulse Rate: 138  (10/26 1900) Intake/Output from previous day: 10/25 0701 - 10/26 0700 In: 590.4 [I.V.:50.4; IV Piggyback:300] Out: 400 [Urine:400] Intake/Output from this shift:    Labs:  Basename 09/09/12 1132 09/09/12 0354 09/08/12 2049 09/08/12 0306 09/07/12 1645 09/07/12 0615  WBC 13.4* 13.4* 14.8* -- -- --  HGB 7.9* 8.2* 8.6* -- -- --  PLT 129* 137* 148* -- -- --  LABCREA -- -- -- -- -- 219.35  CREATININE -- 1.57* -- 1.17 1.25 --   Estimated Creatinine Clearance: 76.9 ml/min (by C-G formula based on Cr of 1.57). No results found for this basename: VANCOTROUGH:2,VANCOPEAK:2,VANCORANDOM:2,GENTTROUGH:2,GENTPEAK:2,GENTRANDOM:2,TOBRATROUGH:2,TOBRAPEAK:2,TOBRARND:2,AMIKACINPEAK:2,AMIKACINTROU:2,AMIKACIN:2, in the last 72 hours    Medical History: Past Medical History  Diagnosis Date  . Cardiomyopathy, nonischemic     EF 35-40% BY 2D ECHO, h/o clinical congestive heart failure; h/o cardiac arrest for which an ICD was implanted in 1998  . Hypertension   . AF (atrial fibrillation)     permanent, on coumadin  . Cardiac arrest 1998    ICD implanted at [facility] at that time  . Tobacco abuse, in remission     20 pack years or less; discontinued in 1998    Medications:  Prescriptions prior to admission  Medication Sig Dispense Refill  . Cholecalciferol (VITAMIN D3) 2000 UNITS capsule Take 2,000 Units by mouth daily.        Marland Kitchen diltiazem (CARDIZEM CD) 180 MG 24 hr capsule Take 180 mg by mouth daily.      . finasteride (PROSCAR) 5 MG tablet Take 5 mg by mouth daily.       Marland Kitchen lisinopril (PRINIVIL,ZESTRIL) 10 MG tablet Take 1 tablet (10 mg total)  by mouth daily.  90 tablet  3  . metoprolol (LOPRESSOR) 100 MG tablet Take 1 tablet (100 mg total) by mouth 2 (two) times daily.  180 tablet  3  . polyethylene glycol powder (GLYCOLAX/MIRALAX) powder Take 17 g by mouth daily as needed. For constipation - mix with 8 oz liquid and drink      . potassium chloride (MICRO-K) 10 MEQ CR capsule Take 1 capsule (10 mEq total) by mouth daily.  90 capsule  3  . pravastatin (PRAVACHOL) 40 MG tablet Take 40 mg by mouth at bedtime.       Marland Kitchen warfarin (COUMADIN) 2 MG tablet Take 2 mg by mouth See admin instructions. Take with 5 mg tablet on Saturday, Sunday, Monday, Tuesday and Thursday      . warfarin (COUMADIN) 3 MG tablet Take 3 mg by mouth See admin instructions. Take with 5 mg tablet on Wednesday and Friday      . warfarin (COUMADIN) 5 MG tablet Take 5 mg by mouth See admin instructions. On Wednesday and Friday, take with 3 mg tablet for an 8 mg dose.  On all other days take with 2 mg tablet for a 7 mg dose       Assessment: [name] [name] is known to pharmacy from management of Coumadin for afib. Noted he was on empiric Ceftriaxone since admit on 10/24, and continues to be febrile with leukocytosis. Pt continues on Azithromycin- noted strep and legionella urinary antigens are negative. SCr has  been increasing since admit and UOP has been low at 0.1 ml/kg/hr in the past 24 hours. Noted nkda.   10/24: Blood: ngtd 10/24: MRSA PCR: neg 10/24: Strep, Legionella urinary antigen: neg  10/24: Ceftriaxone>> 10/26 10/24: Azithromycin>> 10/24: Vancomycin>> 10/24: Cefepime>>  Goal of Therapy:  Vancomycin trough level 15-20 mcg/ml Treat infection  Plan:  - Cefepime 1gm IV q 8h - Vancomycin 2gm IV now, then 1500 q 24h - Consider d/c azithromycin - Will monitor cx/spec/sens, renal fn and clinical status daily.  Thanks, Cailen Texeira K. Allena Katz, PharmD, BCPS.  Clinical Pharmacist Pager [phone]. 09/09/2012 8:27 PM

## 2012-09-09 NOTE — Progress Notes (Signed)
Subjective:  56 y/o male with a history of CHF, LVEF 35-40%, with AICD recently admitted for pneumonia.  He has been followed by cardiology for management of AFIB with RVR in the setting of pneumonia.  He is currently on Metoprolol 100 mg bid and on Cardizem drip but his heart rate is still in the 150 bpm range. Cardiology was re-called JY:NWGN for Amiodarone treatment.   Objective:  Vital Signs in the last 24 hours: Temp:  [98.2 F (36.8 C)-103.5 F (39.7 C)] 99.7 F (37.6 C) (10/26 2111) Pulse Rate:  [66-152] 94  (10/26 2111) Resp:  [25-43] 35  (10/26 2111) BP: (84-160)/(46-106) 105/90 mmHg (10/26 2111) SpO2:  [90 %-100 %] 90 % (10/26 2111) Weight:  [119 kg (262 lb 5.6 oz)] 119 kg (262 lb 5.6 oz) (10/26 0517)  Intake/Output from previous day: 10/25 0701 - 10/26 0700 In: 590.4 [I.V.:50.4; IV Piggyback:300] Out: 400 [Urine:400] Intake/Output from this shift: Total I/O In: 15 [I.V.:15] Out: 100 [Urine:100]  Physical Exam: General appearance: alert, cooperative and appears stated age CV: irregularly irregular rhythm, no murmurs  Resp: CTA bilaterally Abd: soft, NT, ND, +BS no HSM Ext; no edema bilaterally  Lab Results:  [facility] 09/09/12 2005 09/09/12 1132  WBC 12.8* 13.4*  HGB 7.6* 7.9*  PLT 137* 129*    Basename 09/09/12 0354 09/08/12 0306  NA 131* 132*  K 4.3 4.1  CL 100 102  CO2 19 20  GLUCOSE 107* 127*  BUN 35* 32*  CREATININE 1.57* 1.17    Basename 09/08/12 0305 09/07/12 2057  TROPONINI <0.30 <0.30   Hepatic Function Panel  Basename 09/08/12 0306  PROT 7.7  ALBUMIN 2.1*  AST 35  ALT 21  ALKPHOS 77  BILITOT 2.5*  BILIDIR --  IBILI --   No results found for this basename: CHOL in the last 72 hours No results found for this basename: PROTIME in the last 72 hours  Imaging:   Cardiac Studies:  Assessment/Plan:   Atrial Fibrillation  LOS: 3 days   1. Atrial Fibrillation with rapid ventricular response rate while of Metoprolol 100 bid and  Cardizem drip: His AFIB with RVR is mainly driven by his pneumonia.  Although he is on antibiotics, he still has fevers which is likely driving his heart rate up.  The main treatment will be to treat the underlying cause of his tachycardia (pneumonia) as you are already doing.  Amiodarone may be useful in the acute setting since his heart rate is still in the 150 bpm range, but his heart rate will likely normalize once his pneumonia is adequately treated.   Brynnlie Unterreiner E 09/09/2012, 9:58 PM

## 2012-09-09 NOTE — Progress Notes (Signed)
Pt. Is tachypneic in 40's and is dyspneic at rest.  Warm to touch.  P.O. Temp = 103.4.  Tachycardic from 130's-150's.  Unable to complete sentence due to SOB.  Dr. Oti notified.  Stat ABGs to be obtained.  Call placed to respiratory therapy to complete. 

## 2012-09-09 NOTE — Progress Notes (Signed)
Subjective: Patient denies any GI issues at this time. No abdominal pain, nausea or vomiting. Stool guaiacs were negative for occult blood.  Objective: Vital signs in last 24 hours: Temp:  [98.2 F (36.8 C)-101.8 F (38.8 C)] 98.2 F (36.8 C) (10/26 1310) Pulse Rate:  [45-154] 92  (10/26 1310) Resp:  [25-43] 43  (10/26 1310) BP: (84-143)/(46-92) 116/70 mmHg (10/26 1310) SpO2:  [90 %-100 %] 93 % (10/26 1344) Weight:  [119 kg (262 lb 5.6 oz)] 119 kg (262 lb 5.6 oz) (10/26 0517) Last BM Date: 09/07/12  Intake/Output from previous day: 10/25 0701 - 10/26 0700 In: 590.4 [I.V.:50.4; IV Piggyback:300] Out: 400 [Urine:400] Intake/Output this shift: Total I/O In: 1074 [P.O.:1020; I.V.:40; IV Piggyback:14] Out: 400 [Urine:400]  General appearance: fatigued and mild distress Resp: clear to auscultation bilaterally Cardio: irregular rate and rhythm-tachycardic 128/minute GI: soft, non-tender; bowel sounds normal; no masses,  no organomegaly Extremities: extremities normal, atraumatic, no cyanosis or edema  Lab Results:  Basename 09/09/12 1132 09/09/12 0354 09/08/12 2049  WBC 13.4* 13.4* 14.8*  HGB 7.9* 8.2* 8.6*  HCT 23.7* 24.5* 25.4*  PLT 129* 137* 148*   BMET  Basename 09/09/12 0354 09/08/12 0306 09/07/12 1645  NA 131* 132* 131*  K 4.3 4.1 4.2  CL 100 102 103  CO2 19 20 17*  GLUCOSE 107* 127* 137*  BUN 35* 32* 32*  CREATININE 1.57* 1.17 1.25  CALCIUM 8.4 8.4 8.3*   LFT  Basename 09/08/12 0306  PROT 7.7  ALBUMIN 2.1*  AST 35  ALT 21  ALKPHOS 77  BILITOT 2.5*  BILIDIR --  IBILI --   PT/INR  Basename 09/09/12 0354 09/08/12 0306  LABPROT 22.4* 22.7*  INR 2.06* 2.10*   Studies/Results: Dg Chest Port 1 View  09/09/2012  *RADIOLOGY REPORT*  Clinical Data: Congestive heart failure and pneumonia.  PORTABLE CHEST - 1 VIEW  Comparison: One-view chest 09/07/2012.  Findings: Cardiac enlargement is again noted.  There is further loss of lung volumes.  Progressive  bibasilar airspace opacities are noted.  Mild edema is stable.  Bilateral pleural effusions are suspected.  IMPRESSION:  1.  Continued volume loss and progressive bibasilar airspace disease.  This may represent worsening atelectasis and edema although bronchopneumonia is not excluded. 2.  Persistent interstitial disease compatible with edema and congestive heart failure.   Original Report Authenticated By: Jamesetta Orleans. MATTERN, M.D.     Medications: I have reviewed the patient's current medications.  Assessment/Plan: Anemia of chronic disease with guaiac negative stools: in face of the patient's other co-morbidities, I would not recommend any GI workup at this time.  LOS: 3 days   [name] [name] 09/09/2012, 4:57 PM

## 2012-09-09 NOTE — Progress Notes (Signed)
Comment: Patient is decidedly unwell at this time. Has spiked again to 103.5, back in fast A.Fib, with HR>130, despite ivi Cardizem at 15 mg/hr. Tachypneic at 30-40/min, and CXR of AM shows progression of airspace disease.  Have consulted PCCM for transfer to ICU, as I feel patient requires a higher level of care, and may be at risk for intubation. It is also possible that he may be septic from multifocal pneumonia. Will broaden antibiotic coverage to include Vancomycin/Cefepime and discontinue Rocephin. I have discussed with [facility] cardiologist on-call re fast A.Fib, and recommendation is to commence Amiodarone.  C. Clair Alfieri. MD, FACP.

## 2012-09-09 NOTE — Progress Notes (Deleted)
Pt. Is tachypneic in 40's and is dyspneic at rest.  Warm to touch.  P.O. Temp = 103.4.  Tachycardic from 130's-150's.  Unable to complete sentence due to SOB.  Dr. Brien Few notified.  Stat ABGs to be obtained.  Call placed to respiratory therapy to complete.

## 2012-09-09 NOTE — Consult Note (Signed)
Name: [name] [name] MRN: [id number] DOB: 11/09/1956    LOS: 3  Referring Provider:  Laveda Norman, MD Reason for Referral:  Tachycardia, respiratory distress  PULMONARY / CRITICAL CARE MEDICINE  HPI:  56 yo M with  NICM, afib, and s/p AICD presented to the ED 10/23 with fever, fatigue and cough.  He was noted to be anemic and had a CXR concerning for possible PNA.  He was admitted and started on ceftraixone and azithromycin. Yesterday he had increased SOB and afib became uncontrolled with rate in 120s-130s.  He was started on diltiazam gtt.  This evening he has continued to have episodes of dyspnea and tachypnea at rest with continued RVR despite dilt gtt.  He also has continued to have fevers, shortness of breath and cough.  Denies chest pain, syncope, or increased weight.  Denies gross blood in stool or hematemsis.  Past Medical History  Diagnosis Date  . Cardiomyopathy, nonischemic     EF 35-40% BY 2D ECHO, h/o clinical congestive heart failure; h/o cardiac arrest for which an ICD was implanted in 1998  . Hypertension   . AF (atrial fibrillation)     permanent, on coumadin  . Cardiac arrest 1998    ICD implanted at [facility] at that time  . Tobacco abuse, in remission     20 pack years or less; discontinued in 1998   Past Surgical History  Procedure Date  . Cardiac defibrillator placement 1998  . 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 Authorizing Provider  Cholecalciferol (VITAMIN D3) 2000 UNITS capsule Take 2,000 Units by mouth daily.     Yes Historical Provider, MD  diltiazem (CARDIZEM CD) 180 MG 24 hr capsule Take 180 mg by mouth daily. 02/24/12  Yes June Leap, MD  finasteride (PROSCAR) 5 MG tablet Take 5 mg by mouth daily.    Yes Historical Provider, MD  lisinopril  (PRINIVIL,ZESTRIL) 10 MG tablet Take 1 tablet (10 mg total) by mouth daily. 05/22/12  Yes June Leap, MD  metoprolol (LOPRESSOR) 100 MG tablet Take 1 tablet (100 mg total) by mouth 2 (two) times daily. 02/24/12  Yes June Leap, MD  polyethylene glycol powder [facility] Xxx) powder Take 17 g by mouth daily as needed. For constipation - mix with 8 oz liquid and drink 04/19/12  Yes Historical Provider, MD  potassium chloride (MICRO-K) 10 MEQ CR capsule Take 1 capsule (10 mEq total) by mouth daily. 02/24/12  Yes June Leap, MD  pravastatin (PRAVACHOL) 40 MG tablet Take 40 mg by mouth at bedtime.    Yes Historical Provider, MD  warfarin (COUMADIN) 2 MG tablet Take 2 mg by mouth See admin instructions. Take with 5 mg tablet on Saturday, Sunday, Monday, Tuesday and Thursday   Yes Historical Provider, MD  warfarin (COUMADIN) 3 MG tablet Take 3 mg by mouth See admin instructions. Take with 5 mg tablet on Wednesday and Friday   Yes Historical Provider, MD  warfarin (COUMADIN) 5 MG tablet Take 5 mg by mouth See admin instructions. On Wednesday and Friday, take with 3 mg tablet for an 8 mg dose.  On all other days take with 2 mg tablet for a 7 mg dose   Yes Historical Provider, MD   Allergies  No Known Allergies  Family History Family History  Problem Relation Age of Onset  . Diabetes Mother     died @ 22.  . Diabetes Brother     this brother also has htn  . Hypertension Father     alive @ 63.  Marland Kitchen Hypertension Brother   . Alzheimer's disease Father    Social History  reports that he quit smoking about 15 years ago. His smoking use included Cigarettes. He has a 4.8 pack-year smoking history. He has never used smokeless tobacco. He reports that he uses illicit drugs (Marijuana). He reports that he does not drink alcohol.  Review Of Systems:  Complete review of systems negative except as listed in HPI  Brief patient description:  56 yo with NICM with afib with RVR, PNA +/- CHF  exacerbation.  Events Since Admission: 10/25 afib RVR, dilt gtt started 10/26 Transfer to ICU, amiodarone started  Current Status:  Vital Signs: Temp:  [98.2 F (36.8 C)-103.5 F (39.7 C)] 99.9 F (37.7 C) (10/26 2015) Pulse Rate:  [45-152] 138  (10/26 1900) Resp:  [25-43] 30  (10/26 1900) BP: (84-160)/(46-106) 108/78 mmHg (10/26 1900) SpO2:  [93 %-100 %] 99 % (10/26 1931) Weight:  [119 kg (262 lb 5.6 oz)] 119 kg (262 lb 5.6 oz) (10/26 0517)  Physical Examination: General:  Appears mildly uncomfortable Neuro:  Awake, alert, moving all 4 extremities HEENT:  Sclera clear, MMM, no oral lesions, EOMI Neck:  Supple, no cervical LAD Cardiovascular:  Tachy, irregular Lungs:  Crackles at bases, no wheeze Abdomen:  Soft, ND, NT Musculoskeletal:  Joints wnl Skin:  No issues  Active Problems:  Atrial fibrillation  SYSTOLIC HEART FAILURE, CHRONIC  Anemia  Pneumonia  Cardiomyopathy, nonischemic  Hypertension   ASSESSMENT AND PLAN  PULMONARY  Lab 09/09/12 1855  PHART 7.502*  PCO2ART 22.9*  PO2ART 91.0  HCO3 17.8*  O2SAT 97.5   Ventilator Settings:   CXR:  bilbasilar opacities ETT:  n/a  A:  Hypoxic respiratory failure, ABG suggest hyperventilation. PNA and/or CHF  P:   - Broaden antibiotics given clinical deterioration and continued fevers - Standing lasix per cardiology  CARDIOVASCULAR  Lab 09/09/12 0354 09/08/12 0306 09/08/12 0305 09/07/12 2057 09/07/12 1634 09/06/12 2210  TROPONINI -- -- <0.30 <0.30 <0.30 --  LATICACIDVEN -- -- -- -- -- 2.1  PROBNP 4957.0* 6800.0* -- -- 5294.0* 5003.0*   ECG:  10/23 afib, rate 120 Lines: PIV  A: NICM, afib RVR P:  - Continue dilt gtt and metoprolol - Starting amio gtt per cardiology - Continue CHF regimen with lasix, hydralazine, Indur, and lisinopril.  Watch BP.  May need to scale back if hypotension develops 2/2 infection process.  RENAL  Lab 09/09/12 0354 09/08/12 1139 09/08/12 0306 09/07/12 1645 09/07/12 0711  09/06/12 2207  NA 131* -- 132* 131* 132* 130*  K 4.3 -- 4.1 -- -- --  CL 100 -- 102 103 103 100  CO2 19 -- 20 17* 20 17*  BUN 35* -- 32* 32* 32* 32*  CREATININE 1.57* -- 1.17 1.25 1.25 1.44*  CALCIUM 8.4 -- 8.4 8.3* 8.5 8.6  MG 2.0 2.0 -- -- -- --  PHOS -- -- -- -- -- --   Intake/Output      10/26 0701 - 10/27 0700   P.O. 1200   I.V. (mL/kg) 70 (0.6)   Other    IV Piggyback 14   Total Intake(mL/kg) 1284 (10.8)   Urine (mL/kg/hr) 550 (0.3)   Total Output 550  Net +734         A:  n/a P:   - Watch electrolytes and UOP   GASTROINTESTINAL  Lab 09/08/12 0306 09/07/12 0711  AST 35 43*  ALT 21 23  ALKPHOS 77 76  BILITOT 2.5* 1.9*  PROT 7.7 7.7  ALBUMIN 2.1* 2.2*    A:  Anemia, but no gross blood P:   - Holding w/u of Fe def anemia until more stable  HEMATOLOGIC  Lab 09/09/12 2005 09/09/12 1132 09/09/12 0354 09/08/12 2049 09/08/12 1139 09/08/12 0306 09/07/12 0711 09/06/12 2226  HGB 7.6* 7.9* 8.2* 8.6* 8.4* -- -- --  HCT 22.4* 23.7* 24.5* 25.4* 24.8* -- -- --  PLT 137* 129* 137* 148* 128* -- -- --  INR -- -- 2.06* -- -- 2.10* 3.51* 3.19*  APTT -- -- -- -- -- -- -- --   A:  Anemia 2/2 ACD and Fe deficiency P:  - Holding GI w/u until more stable - Coumadin for afib  INFECTIOUS  Lab 09/09/12 2005 09/09/12 1132 09/09/12 0354 09/08/12 2049 09/08/12 1139  WBC 12.8* 13.4* 13.4* 14.8* 11.5*  PROCALCITON -- -- -- -- --   Cultures: 10/24 blood x2 >>> Antibiotics: Ceftriaxone 10/24 >>>10/26 Azithromycin 10/24 >>> Cefepime 10/26>>> Vancomycin 10/26>>>  A:  Persistent fevers, possible PNA. No other obvious source of infection P:   -Agree with broadened coverage with cefepime and vancomycin -Resent blood culture -Repeat UA and get sputum sample    BEST PRACTICE / DISPOSITION Level of Care:  Stepdown > ICU Primary Service:  Hospitalist > PCCM Consultants:  GI, cardiology Code Status:  full Diet:  Clears DVT Px:  therapeutic coumadin GI Px:   protonix Skin Integrity:  No issues Social / Family:  Not present  CC time 50 min  Akire Rennert, M.D. Pulmonary and Critical Care Medicine [facility] Pager: [phone]  09/09/2012, 8:53 PM

## 2012-09-10 DIAGNOSIS — N179 Acute kidney failure, unspecified: Secondary | ICD-10-CM

## 2012-09-10 DIAGNOSIS — I10 Essential (primary) hypertension: Secondary | ICD-10-CM

## 2012-09-10 DIAGNOSIS — D649 Anemia, unspecified: Secondary | ICD-10-CM

## 2012-09-10 DIAGNOSIS — A419 Sepsis, unspecified organism: Secondary | ICD-10-CM

## 2012-09-10 LAB — COMPREHENSIVE METABOLIC PANEL
Alkaline Phosphatase: 68 U/L (ref 39–117)
BUN: 37 mg/dL — ABNORMAL HIGH (ref 6–23)
Chloride: 101 mEq/L (ref 96–112)
GFR calc Af Amer: 51 mL/min — ABNORMAL LOW (ref >90)
Glucose, Bld: 108 mg/dL — ABNORMAL HIGH (ref 70–99)
Potassium: 4.5 mEq/L (ref 3.5–5.1)
Total Bilirubin: 1.9 mg/dL — ABNORMAL HIGH (ref 0.3–1.2)
Total Protein: 7.6 g/dL (ref 6.0–8.3)

## 2012-09-10 LAB — INFLUENZA PANEL BY PCR (TYPE A & B)
H1N1 flu by pcr: NOT DETECTED
Influenza B By PCR: NEGATIVE

## 2012-09-10 LAB — URINALYSIS, ROUTINE W REFLEX MICROSCOPIC
Glucose, UA: NEGATIVE mg/dL
Hgb urine dipstick: NEGATIVE
Specific Gravity, Urine: 1.023 (ref 1.005–1.030)
Urobilinogen, UA: 1 mg/dL (ref 0.0–1.0)

## 2012-09-10 LAB — URINE MICROSCOPIC-ADD ON

## 2012-09-10 LAB — CBC
Hemoglobin: 8 g/dL — ABNORMAL LOW (ref 13.0–17.0)
MCH: 26.9 pg (ref 26.0–34.0)
MCH: 27 pg (ref 26.0–34.0)
MCV: 82.2 fL (ref 78.0–100.0)
Platelets: 124 10*3/uL — ABNORMAL LOW (ref 150–400)
Platelets: 156 10*3/uL (ref 150–400)
RBC: 2.86 MIL/uL — ABNORMAL LOW (ref 4.22–5.81)
RBC: 2.96 MIL/uL — ABNORMAL LOW (ref 4.22–5.81)
WBC: 17.2 10*3/uL — ABNORMAL HIGH (ref 4.0–10.5)

## 2012-09-10 LAB — POCT I-STAT 3, ART BLOOD GAS (G3+)
Bicarbonate: 18.5 mEq/L — ABNORMAL LOW (ref 20.0–24.0)
TCO2: 19 mmol/L (ref 0–100)
pCO2 arterial: 24.3 mmHg — ABNORMAL LOW (ref 35.0–45.0)
pH, Arterial: 7.489 — ABNORMAL HIGH (ref 7.350–7.450)
pO2, Arterial: 69 mmHg — ABNORMAL LOW (ref 80.0–100.0)

## 2012-09-10 LAB — TROPONIN I: Troponin I: <0.3 ng/mL (ref <0.30)

## 2012-09-10 LAB — PRO B NATRIURETIC PEPTIDE: Pro B Natriuretic peptide (BNP): 5889 pg/mL — ABNORMAL HIGH (ref 0–125)

## 2012-09-10 LAB — HIV ANTIBODY (ROUTINE TESTING W REFLEX): HIV: NONREACTIVE

## 2012-09-10 MED ORDER — PANTOPRAZOLE SODIUM 40 MG PO TBEC
40.0000 mg | DELAYED_RELEASE_TABLET | Freq: Every day | ORAL | Status: DC
  Administered 2012-09-10 – 2012-09-28 (×18): 40 mg via ORAL
  Filled 2012-09-10 (×18): qty 1

## 2012-09-10 MED ORDER — DILTIAZEM LOAD VIA INFUSION
10.0000 mg | Freq: Once | INTRAVENOUS | Status: AC
  Administered 2012-09-10: 10 mg via INTRAVENOUS
  Filled 2012-09-10: qty 10

## 2012-09-10 MED ORDER — WARFARIN SODIUM 5 MG PO TABS
5.0000 mg | ORAL_TABLET | Freq: Once | ORAL | Status: AC
  Administered 2012-09-10: 5 mg via ORAL
  Filled 2012-09-10 (×2): qty 1

## 2012-09-10 MED ORDER — DILTIAZEM HCL 25 MG/5ML IV SOLN: 10.0000 mg | Freq: Once | INTRAVENOUS | Status: DC

## 2012-09-10 NOTE — Progress Notes (Addendum)
ANTICOAGULATION CONSULT NOTE - Follow Up Consult  Pharmacy Consult for Coumadin Indication: atrial fibrillation  No Known Allergies  Patient Measurements: Height: 6\' 5"  (195.6 cm) Weight: 255 lb 1.2 oz (115.7 kg) IBW/kg (Calculated) : 89.1   Vital Signs: Temp: 98.9 F (37.2 C) (10/27 0746) Temp src: Oral (10/27 0746) BP: 130/72 mmHg (10/27 0949) Pulse Rate: 115  (10/27 0948)  Labs:  Basename 09/10/12 1002 09/10/12 0445 09/09/12 2236 09/09/12 2235 09/09/12 0354 09/08/12 0306 09/08/12 0305  HGB 8.0* 7.7* -- -- -- -- --  HCT 24.4* 23.5* 23.4* -- -- -- --  PLT 156 124* 127* -- -- -- --  APTT -- -- -- -- -- -- --  LABPROT -- 25.6* -- -- 22.4* 22.7* --  INR -- 2.47* -- -- 2.06* 2.10* --  HEPARINUNFRC -- -- -- -- -- -- --  CREATININE -- 1.69* 1.56* -- 1.57* -- --  CKTOTAL -- -- 28 -- -- -- --  CKMB -- -- -- -- -- -- --  TROPONINI -- <0.30 -- <0.30 -- -- <0.30    Estimated Creatinine Clearance: 70.5 ml/min (by C-G formula based on Cr of 1.69).   Medications:  Home regimen: 7mg  (5mg  tab + 2mg  tab) daily except 8mg  (5mg  + 3mg ) on Wed and Fri  Assessment: Coumadin pta for afib, admitted with INR 3.19. Coumadin placed on hold 2/2 anemia and possible GI blood loss (reports of blood tinged sputum) with last dose taken 10/22. INR continued to rise and vitamin K 5mg  given 10/24. INR is now therapeutic at 2.47. Hgb/plts low but stable, no bleeding reported. Noted amiodarone started last PM, this can increase INR abruptly via decreased Coumadin metabolism.  Goal of Therapy:  INR 2-3 Monitor platelets by anticoagulation protocol: Yes   Plan:  - Coumadin 5mg  PO x 1 today - Will f/up daily PT/INR  Thanks, Corrado Hymon K. Allena Katz, PharmD, BCPS.  Clinical Pharmacist Pager [phone]. 09/10/2012 11:19 AM

## 2012-09-10 NOTE — Progress Notes (Signed)
Referring provider: Isidor [name] Reason for consultation: Acute respiratory distress Chief complaint: Short of breath  [name] [name] is a 55 y.o. male former smoker admitted on 09/06/2012 with dyspnea, cough with sputum, fever and chills from PNA.  Also found to have anemia.  Had persistent fever, and worsening respiratory status prompting transfer to ICU 10/26.  PCCM consulted 10/26.  Significant PMHx of systolic CHF (EF 35 to 40%), HTN, A fib, a/p AICD  Line/tube:  Cultures: Blood 10/24>> Legionella Ag 10/24>>negative Pneumococcal Ag 10/24>>negative Blood 10/26>> Influenza panel 10/27>> HIV 10/27>>  Antibiotics: Zithromax 10/23>> Rocephin 10/23>>10/26 Cefepime 10/26>> Vancomycin 10/26>>  Consultants: [name] [name] Cardiology GI [name] [name])  Best Practice: SUP - Protonix DVT - Chronic coumadin   Tests:  Events: 10/24 Transfuse 2 units PRBC 10/25 Recurrent fever, hypoxia 10/26 Transfer to ICU  SUBJECTIVE: No recurrence of fever since 10/26.  Still has cough, but not as much sputum.  C/o loose BM, but denies abdominal pain.  Still gets winded.  Denies chest pain.  OBJECTIVE:  Blood pressure 124/57, pulse 87, temperature 98.9 F (37.2 C), temperature source Oral, resp. rate 31, height 6\' 5"  (1.956 m), weight 255 lb 1.2 oz (115.7 kg), SpO2 100.00%.  Wt Readings from Last 3 Encounters:  09/09/12 255 lb 1.2 oz (115.7 kg)  08/03/12 262 lb (118.842 kg)  05/09/12 269 lb 1.9 oz (122.072 kg)   Body mass index is 30.25 kg/(m^2).  I/O last 3 completed shifts: In: 2111.7 [P.O.:1200; I.V.:357.7; Other:240; IV Piggyback:314] Out: 950 [Urine:950]  General - Ill appearing, increased WOB, but speaks in full sentences HEENT - No sinus tenderness, no LAN Cardiac - irregular, no murmur Chest - scattered rhonchi, no wheeze Abd - soft, non tender, + bowel sounds Ext - no edema Neuro - normal strength Skin - no rashes  CBC Lab Results  Component Value Date   WBC 13.3*  09/10/2012   HGB 7.7* 09/10/2012   HCT 23.5* 09/10/2012   MCV 82.2 09/10/2012   PLT 124* 09/10/2012   BMET Lab Results  Component Value Date   CREATININE 1.69* 09/10/2012   BUN 37* 09/10/2012   NA 130* 09/10/2012   K 4.5 09/10/2012   CL 101 09/10/2012   CO2 19 09/10/2012   LFT Lab Results  Component Value Date   ALT 13 09/10/2012   AST 19 09/10/2012   ALKPHOS 68 09/10/2012   BILITOT 1.9* 09/10/2012   ABG    Component Value Date/Time   PHART 7.464* 09/09/2012 2327   PCO2ART 25.7* 09/09/2012 2327   PO2ART 78.0* 09/09/2012 2327   HCO3 18.4* 09/09/2012 2327   TCO2 19 09/09/2012 2327   ACIDBASEDEF 4.0* 09/09/2012 2327   O2SAT 96.0 09/09/2012 2327    BNP (last 3 results)  Basename 09/10/12 0445 09/09/12 0354 09/08/12 0306  PROBNP 5889.0* 4957.0* 6800.0*      Dg Chest Port 1 View  09/09/2012  *RADIOLOGY REPORT*  Clinical Data: Congestive heart failure and pneumonia.  PORTABLE CHEST - 1 VIEW  Comparison: One-view chest 09/07/2012.  Findings: Cardiac enlargement is again noted.  There is further loss of lung volumes.  Progressive bibasilar airspace opacities are noted.  Mild edema is stable.  Bilateral pleural effusions are suspected.  IMPRESSION:  1.  Continued volume loss and progressive bibasilar airspace disease.  This may represent worsening atelectasis and edema although bronchopneumonia is not excluded. 2.  Persistent interstitial disease compatible with edema and congestive heart failure.   Original Report Authenticated By: [name] [name]. [name] [name], M.D.  ASSESSMENT/PLAN:  Acute respiratory distress. Most likely from PNA rather than pulmonary edema. P: Supplemental oxygen to keep SpO2 > 92% BPAP prn F/u CXR PRN xopenex  Pneumonia with persistent fevers. P: Continue vancomycin, cefepime, zithromax Check influenza panel, HIV  Hx of systolic CHF, A fib, HTN, hyperlipidemia. Does not appear to be volume overload. P: Rate control, anti-HTN meds per  cardiology Keep in even fluid balance Coumadin per pharmacy for A fib  Anemia. Chronic disease.  Seen by GI>>no interventions planned. P: F/u CBC Transfuse for Hb < 7  Chronic renal insufficiency. P: Monitor renal fx, urine outpt  Critical care time 35 minutes.  [name] Helling, MD [name] [name] - [facility] Pulmonary/Critical Care 09/10/2012, 8:16 AM Pager:  [phone] After 3pm call: [phone]

## 2012-09-10 NOTE — Progress Notes (Signed)
Patient Name: [name] [name]      SUBJECTIVE:   Past Medical History  Diagnosis Date  . Cardiomyopathy, nonischemic     EF 35-40% BY 2D ECHO, h/o clinical congestive heart failure; h/o cardiac arrest for which an ICD was implanted in 1998  . Hypertension   . AF (atrial fibrillation)     permanent, on coumadin  . Cardiac arrest 1998    ICD implanted at [facility] at that time  . Tobacco abuse, in remission     20 pack years or less; discontinued in 1998    PHYSICAL EXAM Filed Vitals:   09/10/12 0600 09/10/12 0700 09/10/12 0744 09/10/12 0746  BP: 113/56 124/57    Pulse: 91 87    Temp:    98.9 F (37.2 C)  TempSrc:    Oral  Resp: 31 31    Height:      Weight:      SpO2: 100% 99% 100%     tachpneic  good air movement RRR    Intake/Output Summary (Last 24 hours) at 09/10/12 0837 Last data filed at 09/10/12 0500  Gross per 24 hour  Intake 1161.25 ml  Output    650 ml  Net 511.25 ml    LABS: Basic Metabolic Panel:  Lab 09/10/12 1610 09/09/12 2236 09/09/12 0354 09/08/12 1139 09/08/12 0306 09/07/12 1645 09/07/12 0711 09/06/12 2207  NA 130* 131* 131* -- 132* 131* 132* 130*  K 4.5 4.4 4.3 -- 4.1 4.2 4.3 4.3  CL 101 99 100 -- 102 103 103 100  CO2 19 20 19  -- 20 17* 20 17*  GLUCOSE 108* 111* 107* -- 127* 137* 106* 109*  BUN 37* 35* 35* -- 32* 32* 32* 32*  CREATININE 1.69* 1.56* 1.57* -- 1.17 1.25 1.25 1.44*  CALCIUM 8.4 8.5 -- -- -- -- -- --  MG -- -- 2.0 2.0 -- -- -- --  PHOS -- -- -- -- -- -- -- --   Cardiac Enzymes:  Basename 09/10/12 0445 09/09/12 2236 09/09/12 2235 09/08/12 0305  CKTOTAL -- 28 -- --  CKMB -- -- -- --  CKMBINDEX -- -- -- --  TROPONINI <0.30 -- <0.30 <0.30   CBC:  Lab 09/10/12 0445 09/09/12 2236 09/09/12 2005 09/09/12 1132 09/09/12 0354 09/08/12 2049 09/08/12 1139 09/07/12 0711  WBC 13.3* 13.3* 12.8* 13.4* 13.4* 14.8* 11.5* --  NEUTROABS -- -- -- -- -- -- -- 5.9  HGB 7.7* 7.9* 7.6* 7.9* 8.2* 8.6* 8.4* --  HCT 23.5* 23.4* 22.4* 23.7*  24.5* 25.4* 24.8* --  MCV 82.2 83.0 80.6 82.3 82.5 80.9 81.0 --  PLT 124* 127* 137* 129* 137* 148* 128* --   PROTIME:  Basename 09/10/12 0445 09/09/12 0354 09/08/12 0306  LABPROT 25.6* 22.4* 22.7*  INR 2.47* 2.06* 2.10*   Liver Function Tests:  Basename 09/10/12 0445 09/08/12 0306  AST 19 35  ALT 13 21  ALKPHOS 68 77  BILITOT 1.9* 2.5*  PROT 7.6 7.7  ALBUMIN 2.0* 2.1*   No results found for this basename: LIPASE:2,AMYLASE:2 in the last 72 hours BNP: BNP (last 3 results)  Basename 09/10/12 0445 09/09/12 0354 09/08/12 0306  PROBNP 5889.0* 4957.0* 6800.0*     ASSESSMENT AND PLAN:  Patient Active Hospital Problem List: Atrial fibrillation (07/29/2009)   SYSTOLIC HEART FAILURE, CHRONIC (07/29/2009)   Anemia   Pneumonia (09/07/2012)   Cardiomyopathy, nonischemic ()   Hypertension ()   Events of last night noted.  T>>103 and worsening airspacedisease with secondary inceae in HR  in afib Hypotension worrisome for sepsis like picture   Per CCM is there a role for transfusion with mixed picture including CHF    Signed, Sherryl Manges MD  09/10/2012

## 2012-09-11 ENCOUNTER — Inpatient Hospital Stay (HOSPITAL_COMMUNITY): Payer: PRIVATE HEALTH INSURANCE

## 2012-09-11 DIAGNOSIS — J9601 Acute respiratory failure with hypoxia: Secondary | ICD-10-CM | POA: Diagnosis present

## 2012-09-11 DIAGNOSIS — I5022 Chronic systolic (congestive) heart failure: Secondary | ICD-10-CM

## 2012-09-11 DIAGNOSIS — I517 Cardiomegaly: Secondary | ICD-10-CM

## 2012-09-11 DIAGNOSIS — I4891 Unspecified atrial fibrillation: Secondary | ICD-10-CM

## 2012-09-11 DIAGNOSIS — J189 Pneumonia, unspecified organism: Principal | ICD-10-CM

## 2012-09-11 LAB — BASIC METABOLIC PANEL
BUN: 45 mg/dL — ABNORMAL HIGH (ref 6–23)
Calcium: 8.3 mg/dL — ABNORMAL LOW (ref 8.4–10.5)
Chloride: 100 mEq/L (ref 96–112)
Creatinine, Ser: 2.08 mg/dL — ABNORMAL HIGH (ref 0.50–1.35)
GFR calc Af Amer: 40 mL/min — ABNORMAL LOW (ref >90)

## 2012-09-11 LAB — PROTIME-INR
INR: 3.21 — ABNORMAL HIGH (ref 0.00–1.49)
Prothrombin Time: 31.1 seconds — ABNORMAL HIGH (ref 11.6–15.2)

## 2012-09-11 LAB — PROCALCITONIN: Procalcitonin: 6.98 ng/mL

## 2012-09-11 MED ORDER — FUROSEMIDE 10 MG/ML IJ SOLN
40.0000 mg | Freq: Every day | INTRAMUSCULAR | Status: DC
  Administered 2012-09-11 – 2012-09-12 (×2): 40 mg via INTRAVENOUS
  Filled 2012-09-11 (×2): qty 4

## 2012-09-11 MED ORDER — METHYLPREDNISOLONE SODIUM SUCC 40 MG IJ SOLR
40.0000 mg | Freq: Two times a day (BID) | INTRAMUSCULAR | Status: DC
  Administered 2012-09-11 (×2): 40 mg via INTRAVENOUS
  Filled 2012-09-11 (×4): qty 1

## 2012-09-11 NOTE — Progress Notes (Signed)
ANTICOAGULATION CONSULT NOTE - Follow Up Consult  Pharmacy Consult for Coumadin Indication: atrial fibrillation  No Known Allergies  Patient Measurements: Height: 6\' 5"  (195.6 cm) Weight: 255 lb 1.2 oz (115.7 kg) IBW/kg (Calculated) : 89.1   Vital Signs: Temp: 99 F (37.2 C) (10/28 0851) Temp src: Oral (10/28 0851) BP: 104/51 mmHg (10/28 0800) Pulse Rate: 101  (10/28 0900)  Labs:  Basename 09/11/12 0510 09/10/12 1020 09/10/12 1002 09/10/12 0445 09/09/12 2236 09/09/12 2235 09/09/12 0354  HGB -- -- 8.0* 7.7* -- -- --  HCT -- -- 24.4* 23.5* 23.4* -- --  PLT -- -- 156 124* 127* -- --  APTT -- -- -- -- -- -- --  LABPROT 31.1* -- -- 25.6* -- -- 22.4*  INR 3.21* -- -- 2.47* -- -- 2.06*  HEPARINUNFRC -- -- -- -- -- -- --  CREATININE 2.08* -- -- 1.69* 1.56* -- --  CKTOTAL -- -- -- -- 28 -- --  CKMB -- -- -- -- -- -- --  TROPONINI -- <0.30 -- <0.30 -- <0.30 --    Estimated Creatinine Clearance: 57.3 ml/min (by C-G formula based on Cr of 2.08).   Medications:  Home regimen: 7mg  (5mg  tab + 2mg  tab) daily except 8mg  (5mg  + 3mg ) on Wed and Fri  Assessment: Coumadin pta for afib, admitted with INR 3.19. Coumadin placed on hold due to concern of anemia and possible GI blood loss (reports of blood tinged sputum) with last dose taken 10/22. INR continued to rise and vitamin K 5mg  was given 10/24. Coumadin was restarted on 10/26. He received 10mg  on the 26th and 5mg  on 10/27. INR today is 3.21. Amiodarone was ordered, but was never administered and per cardiology's note on 10/28 it is not ideal and diltiazem is preferred.  Goal of Therapy:  INR 2-3 Monitor platelets by anticoagulation protocol: Yes   Plan:  1. Hold tonight's Coumadin dose in setting of supratherapeutic INR 2. Will check daily PT/INR  [name] [name], PharmD Clinical Pharmacist Pager: [phone] Phone: [phone] 09/11/2012 11:21 AM

## 2012-09-11 NOTE — Progress Notes (Signed)
Referring provider: Isidor Holts Reason for consultation: Acute respiratory distress Chief complaint: Short of breath  [name] [name] is a 56 y.o. male former smoker admitted on 09/06/2012 with dyspnea, cough with sputum, fever and chills from PNA.  Also found to have anemia.  Had persistent fever, and worsening respiratory status prompting transfer to ICU 10/26.  PCCM consulted 10/26.  Significant PMHx of systolic CHF (EF 35 to 40%), HTN, A fib, a/p AICD  Line/tube: Peripheral IV x2 Cultures: Blood 10/24>> Legionella Ag 10/24>>negative Pneumococcal Ag 10/24>>negative Blood 10/26>>ng Influenza panel 10/27>>negative A and B HIV 10/27>>NR  Antibiotics: Zithromax 10/23>>10/28 Rocephin 10/23>>10/26 Cefepime 10/26>> Vancomycin 10/26>>  Consultants: [facility] Elnoria Howard)  Best Practice: SUP - Protonix DVT - Chronic coumadin   Tests:  Events: 10/24 Transfuse 2 units PRBC 10/25 Recurrent fever, hypoxia 10/26 Transfer to ICU  SUBJECTIVE: Febrile to 103 overnight.     OBJECTIVE:  Blood pressure 95/45, pulse 78, temperature 100.4 F (38 C), temperature source Oral, resp. rate 29, height 6\' 5"  (1.956 m), weight 255 lb 1.2 oz (115.7 kg), SpO2 97.00%.  Wt Readings from Last 3 Encounters:  09/09/12 255 lb 1.2 oz (115.7 kg)  08/03/12 262 lb (118.842 kg)  05/09/12 269 lb 1.9 oz (122.072 kg)   Body mass index is 30.25 kg/(m^2).  I/O last 3 completed shifts: In: 1662.3 [P.O.:360; I.V.:1252.3; IV Piggyback:50] Out: 575 [Urine:575]  General - Ill appearing, labored work of breathing, speaks in full sentences HEENT - moist mucous membranes Cardiac - irregular, no murmur Chest - scattered rhonchi with expiratory wheezing, no crackles Abd - soft, non tender, + bowel sounds Ext - no edema Neuro - normal strength Skin - no rashes  CBC Lab Results  Component Value Date   WBC 17.2* 09/10/2012   HGB 8.0* 09/10/2012   HCT 24.4* 09/10/2012   MCV 82.4 09/10/2012     PLT 156 09/10/2012   BMET Lab Results  Component Value Date   CREATININE 2.08* 09/11/2012   BUN 45* 09/11/2012   NA 129* 09/11/2012   K 4.9 09/11/2012   CL 100 09/11/2012   CO2 19 09/11/2012   LFT Lab Results  Component Value Date   ALT 13 09/10/2012   AST 19 09/10/2012   ALKPHOS 68 09/10/2012   BILITOT 1.9* 09/10/2012   ABG    Component Value Date/Time   PHART 7.489* 09/10/2012 1338   PCO2ART 24.3* 09/10/2012 1338   PO2ART 69.0* 09/10/2012 1338   HCO3 18.5* 09/10/2012 1338   TCO2 19 09/10/2012 1338   ACIDBASEDEF 4.0* 09/10/2012 1338   O2SAT 95.0 09/10/2012 1338    BNP (last 3 results)  Basename 09/10/12 0445 09/09/12 0354 09/08/12 0306  PROBNP 5889.0* 4957.0* 6800.0*     Dg Chest Port 1 View  09/11/2012  *RADIOLOGY REPORT*  Clinical Data: Follow up pneumonia, shortness of breath.  PORTABLE CHEST - 1 VIEW  Comparison: 09/09/2012.  Findings: Trachea is midline.  Pacemaker and AICD lead tips are stable in position.  Heart is enlarged.  There is diffuse bilateral air space disease, right greater than left.  No definite pleural fluid.  IMPRESSION: Diffuse bilateral air space disease, right greater than left, possibly due to edema.  Developing pneumonia is another consideration.   Original Report Authenticated By: Reyes Ivan, M.D.    Dg Chest Port 1 View  09/09/2012  *RADIOLOGY REPORT*  Clinical Data: Congestive heart failure and pneumonia.  PORTABLE CHEST - 1 VIEW  Comparison: One-view chest 09/07/2012.  Findings: Cardiac enlargement  is again noted.  There is further loss of lung volumes.  Progressive bibasilar airspace opacities are noted.  Mild edema is stable.  Bilateral pleural effusions are suspected.  IMPRESSION:  1.  Continued volume loss and progressive bibasilar airspace disease.  This may represent worsening atelectasis and edema although bronchopneumonia is not excluded. 2.  Persistent interstitial disease compatible with edema and congestive heart  failure.   Original Report Authenticated By: Jamesetta Orleans. MATTERN, M.D.     ASSESSMENT/PLAN:  Acute respiratory failure. Likely from CAP more than pulmonary edema. Component of bronchospasm present as well.  CXR: 10/28: bilateral airspace disease R>L P: Supplemental oxygen to keep SpO2 > 92% BPAP prn F/u CXR PRN xopenex Add solumedrol 40 q12 in setting of bronchospasm  Pneumonia with persistent fevers. Influenza panel and HIV negative P: Continue vancomycin, cefepime, zithromax F/u blood cultures F/u procalcitonin   Hx of systolic CHF, A fib, HTN, hyperlipidemia. On diltiazem drip for a-fib Overall +1.3L since admission P: Rate control, anti-HTN meds per cardiology Switch from oral to IV lasix 40mg  daily Coumadin per pharmacy for A fib  Anemia. Chronic disease.  Seen by GI>>no interventions planned. P: F/u CBC Transfuse for Hb < 7  Chronic renal insufficiency: worsening creatinine, baseline 1.2 P: Monitor BMP Monitor urine output  Endocrine: No acute issues Monitor CBG   BEST PRACTICE / DISPOSITION  Level of Care: ICU  Primary Service: PCCM  Consultants: cardiology, GI  Code Status: Full  Diet: clear liquids DVT Px: on chronic warfarin  GI Px: ppi po Skin Integrity: Intact  Social / Family: patient updated   Marena Chancy, PGY-2 Family Medicine Resident ICU Rotation  Care during the described time interval was provided by me and/or other providers on the critical care team.  I have reviewed this patient's available data, including medical history, events of note, physical examination and test results as part of my evaluation  CC time x  47m  Cyril Mourning MD. Tonny Bollman. [facility] Pager [phone] If no response call [phone]

## 2012-09-11 NOTE — Progress Notes (Signed)
*  PRELIMINARY RESULTS* Echocardiogram 2D Echocardiogram has been performed.  [name] [name] 09/11/2012, 1:39 PM

## 2012-09-11 NOTE — Progress Notes (Signed)
SUBJECTIVE: The patient remains ill.  Still SOB.  Denies CP or new concerns.      Marland Kitchen amiodarone  150 mg Intravenous Once  . azithromycin  500 mg Intravenous Q24H  . ceFEPime (MAXIPIME) IV  1 g Intravenous Q8H  . diltiazem  10 mg Intravenous Once  . feeding supplement  1 Container Oral TID BM  . furosemide  40 mg Oral Daily  . guaiFENesin  600 mg Oral BID  . hydrALAZINE  50 mg Oral TID  . isosorbide mononitrate  60 mg Oral Daily  . metoprolol  100 mg Oral BID  . pantoprazole  40 mg Oral Q1200  . pravastatin  40 mg Oral QPC supper  . sodium chloride  3 mL Intravenous Q12H  . spironolactone  25 mg Oral Daily  . vancomycin  1,500 mg Intravenous Q24H  . warfarin  5 mg Oral ONCE-1800  . Warfarin - Pharmacist Dosing Inpatient   Does not apply q1800  . DISCONTD: diltiazem  10 mg Intravenous Once  . DISCONTD: levalbuterol  0.63 mg Nebulization Q6H  . DISCONTD: lisinopril  20 mg Oral Daily  . DISCONTD: pantoprazole (PROTONIX) IV  40 mg Intravenous Q24H      . amiodarone (NEXTERONE PREMIX) 360 mg/200 mL dextrose    . diltiazem (CARDIZEM) infusion 20 mg/hr (09/11/12 0742)    OBJECTIVE: Physical Exam: Filed Vitals:   09/11/12 0400 09/11/12 0444 09/11/12 0500 09/11/12 0600  BP: 91/43  86/42 95/45  Pulse: 86  80 78  Temp:  100.4 F (38 C)    TempSrc:  Oral    Resp: 30  36 29  Height:      Weight:      SpO2: 97%  98% 97%    Intake/Output Summary (Last 24 hours) at 09/11/12 0818 Last data filed at 09/11/12 0600  Gross per 24 hour  Intake   1305 ml  Output    475 ml  Net    830 ml    Telemetry reveals afib, V rates 100  GEN- The patient is ill appearing, alert and oriented x 3 today.   Head- normocephalic, atraumatic Eyes-  Sclera clear, conjunctiva pink Ears- hearing intact Oropharynx- clear Neck- supple Lungs- Clear to ausculation bilaterally, normal work of breathing Heart- irregular rate and rhythm  GI- soft, NT, ND, + BS Extremities- no clubbing, cyanosis, +  dependant edema Skin- no rash or lesion Psych- euthymic mood, full affect Neuro- strength and sensation are intact  LABS: Basic Metabolic Panel:  Basename 09/11/12 0510 09/10/12 0445 09/09/12 0354 09/08/12 1139  NA 129* 130* -- --  K 4.9 4.5 -- --  CL 100 101 -- --  CO2 19 19 -- --  GLUCOSE 122* 108* -- --  BUN 45* 37* -- --  CREATININE 2.08* 1.69* -- --  CALCIUM 8.3* 8.4 -- --  MG -- -- 2.0 2.0  PHOS -- -- -- --   Liver Function Tests:  [facility] 09/10/12 0445  AST 19  ALT 13  ALKPHOS 68  BILITOT 1.9*  PROT 7.6  ALBUMIN 2.0*   No results found for this basename: LIPASE:2,AMYLASE:2 in the last 72 hours CBC:  Basename 09/10/12 1002 09/10/12 0445  WBC 17.2* 13.3*  NEUTROABS -- --  HGB 8.0* 7.7*  HCT 24.4* 23.5*  MCV 82.4 82.2  PLT 156 124*   Cardiac Enzymes:  Basename 09/10/12 1020 09/10/12 0445 09/09/12 2236 09/09/12 2235  CKTOTAL -- -- 28 --  CKMB -- -- -- --  CKMBINDEX -- -- -- --  TROPONINI <0.30 <0.30 -- <0.30   BNP: No components found with this basename: POCBNP:3 D-Dimer: No results found for this basename: DDIMER:2 in the last 72 hours Hemoglobin A1C: No results found for this basename: HGBA1C in the last 72 hours Fasting Lipid Panel: No results found for this basename: CHOL,HDL,LDLCALC,TRIG,CHOLHDL,LDLDIRECT in the last 72 hours Thyroid Function Tests: No results found for this basename: TSH,T4TOTAL,FREET3,T3FREE,THYROIDAB in the last 72 hours Anemia Panel: No results found for this basename: VITAMINB12,FOLATE,FERRITIN,TIBC,IRON,RETICCTPCT in the last 72 hours  RADIOLOGY: Dg Chest 2 View  09/06/2012  *RADIOLOGY REPORT*  Clinical Data: Shortness of breath  CHEST - 2 VIEW  Comparison: 05/22/2010  Findings: Cardiomegaly.  Central vascular congestion. Changed left chest wall battery pack with additional lead tip since the prior. Bilateral patchy airspace opacities, right greater than left.  No pleural effusion.  No pneumothorax.  No acute osseous  finding. There is a rounded calcific density projecting over the left upper quadrant, perhaps a splenic cyst, similar to prior.  IMPRESSION: Cardiomegaly with central vascular congestion.  Patchy airspace opacities are nonspecific.  Favored to reflect multifocal pneumonia versus pulmonary edema. Recommend radiograph follow-up after appropriate management to document resolution.   Original Report Authenticated By: Waneta Martins, M.D.    Dg Chest Port 1 View  09/11/2012  *RADIOLOGY REPORT*  Clinical Data: Follow up pneumonia, shortness of breath.  PORTABLE CHEST - 1 VIEW  Comparison: 09/09/2012.  Findings: Trachea is midline.  Pacemaker and AICD lead tips are stable in position.  Heart is enlarged.  There is diffuse bilateral air space disease, right greater than left.  No definite pleural fluid.  IMPRESSION: Diffuse bilateral air space disease, right greater than left, possibly due to edema.  Developing pneumonia is another consideration.   Original Report Authenticated By: Reyes Ivan, M.D.    Dg Chest Port 1 View  09/09/2012  *RADIOLOGY REPORT*  Clinical Data: Congestive heart failure and pneumonia.  PORTABLE CHEST - 1 VIEW  Comparison: One-view chest 09/07/2012.  Findings: Cardiac enlargement is again noted.  There is further loss of lung volumes.  Progressive bibasilar airspace opacities are noted.  Mild edema is stable.  Bilateral pleural effusions are suspected.  IMPRESSION:  1.  Continued volume loss and progressive bibasilar airspace disease.  This may represent worsening atelectasis and edema although bronchopneumonia is not excluded. 2.  Persistent interstitial disease compatible with edema and congestive heart failure.   Original Report Authenticated By: Jamesetta Orleans. MATTERN, M.D.    Dg Chest Port 1 View  09/07/2012  *RADIOLOGY REPORT*  Clinical Data: 56 year old male respiratory distress, right lower chest pain and productive cough.  PORTABLE CHEST - 1 VIEW  Comparison: 09/06/2012  and earlier.  Findings: Semi a upright AP portable view 1412 hours.  Stable cardiomegaly.  Left chest cardiac AICD again noted.  No definite effusion.  Patchy right lung opacity has increased in confluent. Less pronounced left perihilar opacity is stable or slightly increased.  No pneumothorax. Visualized tracheal air column is within normal limits.  IMPRESSION: Interval increased confluence of patchy right lung opacity.  Lesser left perihilar opacity is more stable.  Favor multilobar infection over asymmetric edema at this point.   Original Report Authenticated By: Harley Hallmark, M.D.     ASSESSMENT AND PLAN:  Active Problems:  Atrial fibrillation  SYSTOLIC HEART FAILURE, CHRONIC  Anemia  Pneumonia  Cardiomyopathy, nonischemic  Hypertension  1. Respiratory failure- primarily due to pneumonia Per primary team  2. Afib- elevated V rates due to  underlying medical illness and anemia Would treat underlying issues I would be reluctant to use amiodarone in setting of ongoing hypoxia for rate control Consider transfusion  3. Acute on chronic systolic dyfunction Mildly volume overloaded,  Creatinine is trending upward Keep Is and Os slightly negative Will obtain echo  (last echo 4/13)  4. Anemia- in face of acute renal failure and afib with RVR transfusion may be beneficial Will defer to primary team  Hillis Range, MD 09/11/2012 8:18 AM

## 2012-09-11 NOTE — Progress Notes (Signed)
09/11/12 1700  Clinical Encounter Type  Visited With Patient and family together  Visit Type Initial   I visited with Drue and his girlfriend and her mother. I prayed with the patient and family. I will follow up with them.

## 2012-09-11 NOTE — Care Management Note (Signed)
    Page 1 of 2   09/29/2012     2:16:39 PM   CARE MANAGEMENT NOTE 09/29/2012  Patient:  [name] [name], [name] [name]   Account Number:  [id number]  Date Initiated:  09/11/2012  Documentation initiated by:  [facility]  Subjective/Objective Assessment:   pneumonia - declined - tx to ICU.  AF with RVR.  Lives with father.     Action/Plan:   Anticipated DC Date:  09/29/2012   Anticipated DC Plan:  SKILLED NURSING FACILITY  In-house referral  Clinical Social Worker      DC Planning Services  CM consult      Choice offered to / List presented to:             Status of service:  Completed, signed off Medicare Important Message given?   (If response is "NO", the following Medicare IM given date fields will be blank) Date Medicare IM given:   Date Additional Medicare IM given:    Discharge Disposition:  SKILLED NURSING FACILITY  Per UR Regulation:  Reviewed for med. necessity/level of care/duration of stay  If discussed at Long Length of Stay Meetings, dates discussed:   09/28/2012    Comments:  Contact:  [name],[name] Father [phone]                 [name],[name] Brother [phone]  09/29/12 [name] Leverett,RN,BSN [phone] PT DISCHARGED TODAY TO [facility], PER CSW ARRANGEMENTS.  09/28/12 [name] Molina,RN,BSN [phone] POSSIBLE DC TOMORROW TO SNF, PER MD, IF HGB REMAINS STABLE. WILL FOLLOW PROGRESS. CSW MADE AWARE--WILL CONFIRM BED AVAILABILITY.  09/26/12 [name] Mcevoy,RN,BSN [phone] CSW CONT TO FOLLOW TO FACILITATE DC TO [facility] WHEN MEDICALLY STABLE FOR DISCHARGE.  COLONOSCOPY TODAY IN EFFORT TO FIND SOURCE OF BLEEDING.  09-12-12 11:15am [name] [name], RNBSN [phone] Talked with patient at bedside.  In bed in sitting position.  SOB with talking - on 5 liters [place].  Confirmed that he lives at home with two brothers.  All are independent but are available to help out if needed.  Has not been out of bed yet.  When able to participate, due to SOB, will need  PT/OT consults. Informed patient that he will probably need some sort of PTon discharge - this may range from a rehab facility - CIRvs SNF or to home PT depending on how he progresses.  Nods head affirmative to understanding.  CM will continue to follow.

## 2012-09-12 ENCOUNTER — Telehealth: Payer: Self-pay | Admitting: Internal Medicine

## 2012-09-12 ENCOUNTER — Inpatient Hospital Stay (HOSPITAL_COMMUNITY): Payer: PRIVATE HEALTH INSURANCE

## 2012-09-12 DIAGNOSIS — J9600 Acute respiratory failure, unspecified whether with hypoxia or hypercapnia: Secondary | ICD-10-CM

## 2012-09-12 LAB — BASIC METABOLIC PANEL
BUN: 63 mg/dL — ABNORMAL HIGH (ref 6–23)
Calcium: 9 mg/dL (ref 8.4–10.5)
Creatinine, Ser: 2.17 mg/dL — ABNORMAL HIGH (ref 0.50–1.35)
GFR calc Af Amer: 38 mL/min — ABNORMAL LOW (ref >90)
GFR calc non Af Amer: 33 mL/min — ABNORMAL LOW (ref >90)
GFR calc non Af Amer: 49 mL/min — ABNORMAL LOW (ref >90)
Glucose, Bld: 208 mg/dL — ABNORMAL HIGH (ref 70–99)
Sodium: 127 mEq/L — ABNORMAL LOW (ref 135–145)

## 2012-09-12 LAB — CBC
MCHC: 32.6 g/dL (ref 30.0–36.0)
RDW: 18 % — ABNORMAL HIGH (ref 11.5–15.5)

## 2012-09-12 LAB — PROTIME-INR: Prothrombin Time: 42.2 seconds — ABNORMAL HIGH (ref 11.6–15.2)

## 2012-09-12 LAB — CARBOXYHEMOGLOBIN
O2 Saturation: 64.1 %
Total hemoglobin: 12.8 g/dL — ABNORMAL LOW (ref 13.5–18.0)

## 2012-09-12 LAB — PROCALCITONIN: Procalcitonin: 6.1 ng/mL

## 2012-09-12 MED ORDER — MILRINONE IN DEXTROSE 20 MG/100ML IV SOLN
0.1250 ug/kg/min | INTRAVENOUS | Status: DC
  Administered 2012-09-12 – 2012-09-15 (×3): 0.125 ug/kg/min via INTRAVENOUS
  Filled 2012-09-12 (×4): qty 100

## 2012-09-12 NOTE — Telephone Encounter (Signed)
Trish aware and will let Dr Johney Frame know to call her

## 2012-09-12 NOTE — Progress Notes (Signed)
CARDIOLOGY ROUNDING NOTE    Patient Name: [name] [name] Date of Encounter: 09-12-2012    SUBJECTIVE:Patient still short of breath.  States may be a little better than yesterday.  No chest pain.    TELEMETRY: Reviewed telemetry pt in atrial fibrillation, v rates 80-100 (on Cardizem gtt)- improved! Filed Vitals:   09/12/12 0400 09/12/12 0414 09/12/12 0500 09/12/12 0600  BP: 113/62  103/54 117/63  Pulse: 68  60 59  Temp:  97.8 F (36.6 C)    TempSrc:  Oral    Resp: 27  33 35  Height:      Weight:      SpO2: 97%  98% 95%    Intake/Output Summary (Last 24 hours) at 09/12/12 0818 Last data filed at 09/12/12 0600  Gross per 24 hour  Intake   1359 ml  Output    950 ml  Net    409 ml   GEN- The patient is ill appearing, alert and oriented x 3 today.  Head- normocephalic, atraumatic  Eyes- Sclera clear, conjunctiva pink  Ears- hearing intact  Oropharynx- clear  Neck- supple  Lungs- Clear to ausculation bilaterally, normal work of breathing  Heart- irregular rate and rhythm, less tachycardic today GI- soft, NT, ND, + BS  Extremities- no clubbing, cyanosis, + dependant edema  Skin- no rash or lesion  Psych- euthymic mood, full affect  Neuro- strength and sensation are intact  LABS: Basic Metabolic Panel:  Basename 09/12/12 0432 09/11/12 0510  NA 130* 129*  K 5.4* 4.9  CL 102 100  CO2 16* 19  GLUCOSE 201* 122*  BUN 63* 45*  CREATININE 2.17* 2.08*  CALCIUM 8.8 8.3*  MG -- --  PHOS -- --   Liver Function Tests:  [facility] 09/10/12 0445  AST 19  ALT 13  ALKPHOS 68  BILITOT 1.9*  PROT 7.6  ALBUMIN 2.0*   CBC:  Basename 09/12/12 0432 09/10/12 1002  WBC 12.5* 17.2*  NEUTROABS -- --  HGB 7.6* 8.0*  HCT 23.3* 24.4*  MCV 82.9 82.4  PLT 139* 156   Cardiac Enzymes:  Basename 09/10/12 1020 09/10/12 0445 09/09/12 2236 09/09/12 2235  CKTOTAL -- -- 28 --  CKMB -- -- -- --  CKMBINDEX -- -- -- --  TROPONINI <0.30 <0.30 -- <0.30    INR: 4.85  Radiology/Studies:  Dg Chest Port 1 View 09/12/2012  *RADIOLOGY REPORT*  Clinical Data: Shortness of breath.  Pneumonia.  PORTABLE CHEST - 1 VIEW  Comparison: Chest 09/09/2012 and 09/11/2012.  Findings: Extensive airspace disease throughout the right chest does not appear changed since yesterday's study.  There is better visualization of the left hemidiaphragm compatible with improved aeration on today's exam.  Marked enlargement of the cardiopericardial silhouette is again seen.  IMPRESSION:  1.  No change in extensive right side airspace disease most consistent with pneumonia. 2.  Improved left basilar aeration. 3.  Marked enlargement cardiopericardial silhouette compatible with cardiomegaly and/or pericardial effusion.   Original Report Authenticated By: Bernadene Bell. Maricela Curet, M.D.      Current Medications:  . ceFEPime (MAXIPIME) IV  1 g Intravenous Q8H  . feeding supplement  1 Container Oral TID BM  . furosemide  40 mg Intravenous Daily  . guaiFENesin  600 mg Oral BID  . hydrALAZINE  50 mg Oral TID  . isosorbide mononitrate  60 mg Oral Daily  . methylPREDNISolone (SOLU-MEDROL) injection  40 mg Intravenous Q12H  . metoprolol  100 mg Oral BID  . pantoprazole  40 mg Oral Q1200  . pravastatin  40 mg Oral QPC supper  . sodium chloride  3 mL Intravenous Q12H  . spironolactone  25 mg Oral Daily  . vancomycin  1,500 mg Intravenous Q24H  . Warfarin - Pharmacist Dosing Inpatient   Does not apply q1800   . diltiazem (CARDIZEM) infusion 15 mg/hr (09/12/12 0650)     Active Problems:  Atrial fibrillation  SYSTOLIC HEART FAILURE, CHRONIC  Anemia  Pneumonia  Cardiomyopathy, nonischemic  Hypertension  Acute respiratory failure with hypoxia    Assessment/Plan per Dr Johney Frame   1. Respiratory failure- primarily due to pneumonia, potentially worsened by CHF  Antibiotics per the primary team He has not responded to diuresis and renal function continues to decline. I am not  convinced that he has significant volume overload presently. Dr Vassie Loll would like to start milrinone. I will check a co-ox. Given his very slow response, I will ask Advanced CHF team to evaluate  2. Afib- elevated V rates due to underlying medical illness and anemia  Rates are presently much better Wean diltiazem as able (particularly in setting of CHF) Would treat underlying issues  I would be reluctant to use amiodarone in setting of ongoing hypoxia for rate control  Consider transfusion   3. Acute on chronic systolic dyfunction  Mildly volume overloaded, Creatinine is trending upward  Keep Is and Os slightly negative  Will add milrinone and ask advanced CHF team to see  4. Anemia- in face of acute renal failure and afib with RVR transfusion may be beneficial  Will defer to primary team   5. Acute on chronic renal failure Concerning in setting of clinical illness Would keep patient dry See how he responds to milrinone  The patient is critically ill with multiple organ systems failure and requires high complexity decision making for assessment and support, frequent evaluation and titration of therapies, application of advanced monitoring technologies and extensive interpretation of multiple databases.   Total CCT spent directly with the patient today is 30 minutes  Fayrene Fearing Bedie Dominey,MD 09/12/2012 4:20 PM

## 2012-09-12 NOTE — Progress Notes (Signed)
Referring provider: Isidor Holts Reason for consultation: Acute respiratory distress Chief complaint: Short of breath  [name] [name] is a 56 y.o. male former smoker admitted on 09/06/2012 with dyspnea, cough with sputum, fever and chills from PNA.  Also found to have anemia.  Had persistent fever, and worsening respiratory status prompting transfer to ICU 10/26.  PCCM consulted 10/26.  Significant PMHx of systolic CHF (EF 35 to 40%), HTN, A fib, a/p AICD  Line/tube: Peripheral IV x2 Cultures: Blood 10/24>>ng Legionella Ag 10/24>>negative Pneumococcal Ag 10/24>>negative Blood 10/26>>ng Influenza panel 10/27>>negative A and B HIV 10/27>>NR  Antibiotics: Zithromax 10/23>>10/28 Rocephin 10/23>>10/26 Cefepime 10/26>> Vancomycin 10/26>>10/29  Consultants: [facility] Elnoria Howard)  Best Practice: SUP - Protonix DVT - Chronic coumadin   Events: 10/24 Transfuse 2 units PRBC 10/25 Recurrent fever, hypoxia 10/26 Transfer to ICU  SUBJECTIVE:  afebrile overnight. Overall feeling better with work of breathing. Not requiring BiPaP.   OBJECTIVE:  Blood pressure 117/63, pulse 59, temperature 97.8 F (36.6 C), temperature source Oral, resp. rate 35, height 6\' 5"  (1.956 m), weight 255 lb 1.2 oz (115.7 kg), SpO2 95.00%.  Wt Readings from Last 3 Encounters:  09/09/12 255 lb 1.2 oz (115.7 kg)  08/03/12 262 lb (118.842 kg)  05/09/12 269 lb 1.9 oz (122.072 kg)   Body mass index is 30.25 kg/(m^2).  I/O last 3 completed shifts: In: 2159 [P.O.:750; I.V.:1305; IV Piggyback:104] Out: 1200 [Urine:1200]  General - no acute distress, sitting in bed.  HEENT - moist mucous membranes Cardiac - irregular, no murmur Chest - no wheezing, no crackles, work of breathing improved since yesterday, speaks in full sentences Abd - soft, non tender, + bowel sounds Ext - no edema Neuro - normal strength Skin - no rashes  CBC Lab Results  Component Value Date   WBC 12.5* 09/12/2012   HGB 7.6* 09/12/2012   HCT 23.3* 09/12/2012   MCV 82.9 09/12/2012   PLT 139* 09/12/2012   BMET Lab Results  Component Value Date   CREATININE 2.17* 09/12/2012   BUN 63* 09/12/2012   NA 130* 09/12/2012   K 5.4* 09/12/2012   CL 102 09/12/2012   CO2 16* 09/12/2012   LFT Lab Results  Component Value Date   ALT 13 09/10/2012   AST 19 09/10/2012   ALKPHOS 68 09/10/2012   BILITOT 1.9* 09/10/2012   ABG    Component Value Date/Time   PHART 7.489* 09/10/2012 1338   PCO2ART 24.3* 09/10/2012 1338   PO2ART 69.0* 09/10/2012 1338   HCO3 18.5* 09/10/2012 1338   TCO2 19 09/10/2012 1338   ACIDBASEDEF 4.0* 09/10/2012 1338   O2SAT 95.0 09/10/2012 1338    BNP (last 3 results)  Basename 09/10/12 0445 09/09/12 0354 09/08/12 0306  PROBNP 5889.0* 4957.0* 6800.0*     Dg Chest Port 1 View  09/12/2012  *RADIOLOGY REPORT*  Clinical Data: Shortness of breath.  Pneumonia.  PORTABLE CHEST - 1 VIEW  Comparison: Chest 09/09/2012 and 09/11/2012.  Findings: Extensive airspace disease throughout the right chest does not appear changed since yesterday's study.  There is better visualization of the left hemidiaphragm compatible with improved aeration on today's exam.  Marked enlargement of the cardiopericardial silhouette is again seen.  IMPRESSION:  1.  No change in extensive right side airspace disease most consistent with pneumonia. 2.  Improved left basilar aeration. 3.  Marked enlargement cardiopericardial silhouette compatible with cardiomegaly and/or pericardial effusion.   Original Report Authenticated By: Bernadene Bell. D'ALESSIO, M.D.    Dg Chest Port 1  View  09/11/2012  *RADIOLOGY REPORT*  Clinical Data: Follow up pneumonia, shortness of breath.  PORTABLE CHEST - 1 VIEW  Comparison: 09/09/2012.  Findings: Trachea is midline.  Pacemaker and AICD lead tips are stable in position.  Heart is enlarged.  There is diffuse bilateral air space disease, right greater than left.  No definite pleural fluid.   IMPRESSION: Diffuse bilateral air space disease, right greater than left, possibly due to edema.  Developing pneumonia is another consideration.   Original Report Authenticated By: Reyes Ivan, M.D.     ASSESSMENT/PLAN:  PULMONOLOGY  Acute respiratory failure. From severe CAP.  Component of bronchospasm present yesterday, now improved.  CXR: 10/29: right sided airspace disease consistent with pneumonia unchanged P: Supplemental oxygen to keep SpO2 > 92% BPAP prn F/u CXR PRN xopenex D/c solumedrol since no active wheezing on exam today Will need evaluation for sleep study as he may have obstructive sleep apnea in the setting of chronic heart disease  CARDIOVASCULAR Hx of systolic CHF, A fib, HTN, hyperlipidemia. On diltiazem drip for a-fib, currently rate controled CHF: still positive fluid balance Echo: 10/28: LVEF moderately depressed, hypokinesis of inferior wall, mild LVH P: - consider switching from IV diltiazem to oral.  - given low urine output and worsening renal failure, will discuss option of starting milrinone with cardiology - d/c spironolactone given hyperkalemia - coumadin per pharmacy  RENAL Chronic renal insufficiency: worsening creatinine, baseline 1.2 P: Monitor BMP Monitor urine output D/c lasix  Hyperkalemia:  No acute EKG change  pm BMP  GI Advance diet  HEME Anemia. Chronic disease.  Seen by GI>>no interventions planned. P: Drop in hemoglobin: will hold on transfusion for now  ID: Severe community acquired pneumonia Influenza panel and HIV negative Procalcitonin: 6.98  P: Continue cefepime for a total of 10 days.  D/c vanc and azithro F/u blood cultures F/u am procalcitonin  Endocrine: No acute issues Monitor CBG  NEURO:  No active issues  BEST PRACTICE / DISPOSITION  Level of Care: ICU  - Ok to transfer to 2900 SDU Primary Service: PCCM  Consultants: cardiology, GI  Code Status: Full  Diet: clear liquids DVT Px: on  chronic warfarin  GI Px: ppi po Skin Integrity: Intact  Social / Family: patient updated   Marena Chancy, PGY-2 Family Medicine Resident ICU Rotation  Issues today seem to be worsening renal function & hyperkalemia, poor response to diuretics which I feel is related to low cardiac output, perhaps inotrope may be indicated - prefer milrinone given Af-RVR on cardizem already  Cyril Mourning MD. FCCP. [facility] Pager [phone] If no response call [phone]

## 2012-09-12 NOTE — Progress Notes (Signed)
1530 RN received milrinone gtt from pharmacy. Requested SvO2 be drawn prior to starting gtt.

## 2012-09-12 NOTE — Telephone Encounter (Signed)
Vennie Homans Resident  [phone]  Please page Judeth Cornfield [phone] concerning possibility of starting Milronone and switching to oral Diltiazem

## 2012-09-12 NOTE — Progress Notes (Signed)
ANTICOAGULATION and ANTIBIOTIC CONSULT NOTE - Follow Up Consult  Pharmacy Consult for warfarin and cefepime Indication: atrial fibrillation, severe CAP  No Known Allergies  Patient Measurements: Height: 6\' 5"  (195.6 cm) Weight: 255 lb 1.2 oz (115.7 kg) IBW/kg (Calculated) : 89.1   Vital Signs: Temp: 97.4 F (36.3 C) (10/29 1200) Temp src: Oral (10/29 1200) BP: 141/67 mmHg (10/29 1100) Pulse Rate: 84  (10/29 1100)  Labs:  Basename 09/12/12 0432 09/11/12 0510 09/10/12 1020 09/10/12 1002 09/10/12 0445 09/09/12 2236 09/09/12 2235  HGB 7.6* -- -- 8.0* -- -- --  HCT 23.3* -- -- 24.4* 23.5* -- --  PLT 139* -- -- 156 124* -- --  APTT -- -- -- -- -- -- --  LABPROT 42.2* 31.1* -- -- 25.6* -- --  INR 4.85* 3.21* -- -- 2.47* -- --  HEPARINUNFRC -- -- -- -- -- -- --  CREATININE 2.17* 2.08* -- -- 1.69* -- --  CKTOTAL -- -- -- -- -- 28 --  CKMB -- -- -- -- -- -- --  TROPONINI -- -- <0.30 -- <0.30 -- <0.30    Estimated Creatinine Clearance: 54.9 ml/min (by C-G formula based on Cr of 2.17).   Medications:  Scheduled:    . ceFEPime (MAXIPIME) IV  1 g Intravenous Q8H  . feeding supplement  1 Container Oral TID BM  . guaiFENesin  600 mg Oral BID  . hydrALAZINE  50 mg Oral TID  . isosorbide mononitrate  60 mg Oral Daily  . metoprolol  100 mg Oral BID  . pantoprazole  40 mg Oral Q1200  . pravastatin  40 mg Oral QPC supper  . sodium chloride  3 mL Intravenous Q12H  . Warfarin - Pharmacist Dosing Inpatient   Does not apply q1800  . DISCONTD: furosemide  40 mg Intravenous Daily  . DISCONTD: methylPREDNISolone (SOLU-MEDROL) injection  40 mg Intravenous Q12H  . DISCONTD: spironolactone  25 mg Oral Daily  . DISCONTD: vancomycin  1,500 mg Intravenous Q24H    Assessment: 56 YOM with history of Afib for which he was on warfarin PTA. His home dose was 7mg  daily except on Wednesdays and Fridays when he took 8mg . This was held due to concern of anemia and possible GI blood loss. It was  restarted on 10/26 when he received 10mg  and then received 5mg  on 10/27. His INR yesterday was 3.21, so the dose was held. This morning his INR is again elevated at 4.85. He also is being treated for severe CAP. He had a negative influenza panel as well as a negative urine Legionella and strep as well as negative MRSA with no other concerns so azithromycin and vancomycin have been stopped. Therapy with cefepime is to continue for a total of 10 days. He is currently afebrile with a WBC of 12.5.    Goal of Therapy:  INR 2-3 Monitor platelets by anticoagulation protocol: Yes Eradication of infection   Plan:  1. Continue cefepime 1g IV q8h 2. Hold warfarin tonight 3. Daily PT/INR 4. Follow cultures/sensitivities, clinical progression, and renal function  Sejla Marzano D. Jayion Schneck, PharmD Clinical Pharmacist Pager: [phone] Phone: [phone] 09/12/2012 1:08 PM

## 2012-09-12 NOTE — Progress Notes (Signed)
18:00 SvO2 drawn. Awaiting results. RN called pharmacy to confirm milrinone can not be given through PIV. Patient only had PIV access. Called Dr. Delford Field with concerns about starting gtt. Dr. Delford Field ordered IV gtt to be started after looking up drug.

## 2012-09-13 ENCOUNTER — Inpatient Hospital Stay (HOSPITAL_COMMUNITY): Payer: PRIVATE HEALTH INSURANCE

## 2012-09-13 DIAGNOSIS — J9600 Acute respiratory failure, unspecified whether with hypoxia or hypercapnia: Secondary | ICD-10-CM

## 2012-09-13 DIAGNOSIS — I4891 Unspecified atrial fibrillation: Secondary | ICD-10-CM

## 2012-09-13 LAB — BASIC METABOLIC PANEL
BUN: 61 mg/dL — ABNORMAL HIGH (ref 6–23)
CO2: 16 mEq/L — ABNORMAL LOW (ref 19–32)
Chloride: 102 mEq/L (ref 96–112)
GFR calc non Af Amer: 59 mL/min — ABNORMAL LOW (ref >90)
Glucose, Bld: 204 mg/dL — ABNORMAL HIGH (ref 70–99)
Potassium: 4.7 mEq/L (ref 3.5–5.1)

## 2012-09-13 LAB — CULTURE, BLOOD (ROUTINE X 2)
Culture: NO GROWTH
Culture: NO GROWTH

## 2012-09-13 LAB — PROTIME-INR: INR: 5.24 (ref 0.00–1.49)

## 2012-09-13 LAB — CBC
HCT: 26.7 % — ABNORMAL LOW (ref 39.0–52.0)
Hemoglobin: 8.8 g/dL — ABNORMAL LOW (ref 13.0–17.0)
MCHC: 33 g/dL (ref 30.0–36.0)
RBC: 3.29 MIL/uL — ABNORMAL LOW (ref 4.22–5.81)

## 2012-09-13 MED ORDER — DILTIAZEM HCL 60 MG PO TABS
60.0000 mg | ORAL_TABLET | Freq: Four times a day (QID) | ORAL | Status: DC
  Administered 2012-09-13 – 2012-09-14 (×6): 60 mg via ORAL
  Filled 2012-09-13 (×8): qty 1

## 2012-09-13 MED ORDER — FUROSEMIDE 10 MG/ML IJ SOLN
40.0000 mg | Freq: Once | INTRAMUSCULAR | Status: AC
  Administered 2012-09-13: 40 mg via INTRAVENOUS
  Filled 2012-09-13: qty 4

## 2012-09-13 NOTE — Evaluation (Signed)
Occupational Therapy Evaluation Patient Details Name: [name] [name] MRN: [id number] DOB: May 16, 1956 Today's Date: 09/13/2012 Time: 1319-1400 OT Time Calculation (min): 41 min  OT Assessment / Plan / Recommendation Clinical Impression  Pt admitted with respiratory failure, afib, systolic HF,anemia, and acute on chronic renal failure.  Pt presents with confusion, generalized weakness, poor balance and decreased endurance impeding mobility and ADL.  Will follow acutely to address these deficit areas.  Pt will need post hospital rehab.    OT Assessment  Patient needs continued OT Services    Follow Up Recommendations  Skilled nursing facility    Barriers to Discharge Decreased caregiver support;Other (comment) (friend reported pt having minimal support of brothers)    Equipment Recommendations  None recommended by OT    Recommendations for Other Services    Frequency  Min 2X/week    Precautions / Restrictions Precautions Precautions: Fall Restrictions Weight Bearing Restrictions: No   Pertinent Vitals/Pain No report of pain, vital stable throughout    ADL  Eating/Feeding: Performed;Set up Where Assessed - Eating/Feeding: Chair Grooming: Simulated;Moderate assistance Where Assessed - Grooming: Unsupported sitting Upper Body Bathing: Simulated;Moderate assistance Where Assessed - Upper Body Bathing: Unsupported sitting Lower Body Bathing: Simulated;+1 Total assistance Where Assessed - Lower Body Bathing: Supported sit to stand Upper Body Dressing: Simulated;Minimal assistance Where Assessed - Upper Body Dressing: Unsupported sitting Lower Body Dressing: Simulated;+1 Total assistance Where Assessed - Lower Body Dressing: Supported sit to stand Toilet Transfer: Simulated;+2 Total assistance (to recliner) Toilet Transfer: Patient Percentage: 70% Toilet Transfer Method: Sit to stand Transfers/Ambulation Related to ADLs: +2 total pt 70%, did not ambulate, pt needs verbal cues  throughout for safe technique ADL Comments: Pt with low endurance interfering with ADL.    OT Diagnosis: Generalized weakness;Cognitive deficits  OT Problem List: Decreased strength;Decreased activity tolerance;Impaired balance (sitting and/or standing);Decreased cognition;Decreased knowledge of use of DME or AE;Cardiopulmonary status limiting activity;Impaired UE functional use OT Treatment Interventions: Self-care/ADL training;Therapeutic exercise;DME and/or AE instruction;Therapeutic activities;Patient/family education;Balance training   OT Goals Acute Rehab OT Goals OT Goal Formulation: With patient Time For Goal Achievement: 09/27/12 Potential to Achieve Goals: Good ADL Goals Pt Will Perform Eating: Independently;Sitting, chair ADL Goal: Eating - Progress: Goal set today Pt Will Perform Grooming: with supervision;Standing at sink ADL Goal: Grooming - Progress: Goal set today Pt Will Perform Upper Body Bathing: with supervision;Sitting at sink ADL Goal: Upper Body Bathing - Progress: Goal set today Pt Will Perform Lower Body Bathing: with min assist;Sitting at sink;Sit to stand from chair ADL Goal: Lower Body Bathing - Progress: Goal set today Pt Will Perform Upper Body Dressing: with modified independence;Sitting, bed ADL Goal: Upper Body Dressing - Progress: Goal set today Pt Will Perform Lower Body Dressing: with min assist;Sit to stand from bed ADL Goal: Lower Body Dressing - Progress: Goal set today Pt Will Transfer to Toilet: with supervision;Ambulation;Regular height toilet ADL Goal: Toilet Transfer - Progress: Goal set today Pt Will Perform Toileting - Clothing Manipulation: with supervision;Standing ADL Goal: Toileting - Clothing Manipulation - Progress: Goal set today Pt Will Perform Toileting - Hygiene: with supervision;Sit to stand from 3-in-1/toilet ADL Goal: Toileting - Hygiene - Progress: Goal set today Miscellaneous OT Goals Miscellaneous OT Goal #1: Pt will use  environmental cues for orientation to place and time with minimal verbal reminders. OT Goal: Miscellaneous Goal #1 - Progress: Goal set today  Visit Information  Last OT Received On: 09/13/12 Assistance Needed: +2 PT/OT Co-Evaluation/Treatment: Yes    Subjective Data  Subjective: "I  don't know how that woman got here so fast from [facility]." Patient Stated Goal: Pt did not state.   Prior Functioning     Home Living Lives With: Family;Other (Comment) (2 brothers and a bedbound father, another brother lives next) Available Help at Discharge: Family Type of Home: Mobile home Home Access: Ramped entrance Bathroom Shower/Tub: Walk-in shower;Curtain Bathroom Toilet: Handicapped height Home Adaptive Equipment: Bedside commode/3-in-1;Walker - rolling;Shower chair with back;Quad cane;Other (comment) (these are available for pts use but belong to his father) Prior Function Level of Independence: Independent Able to Take Stairs?: Yes Driving: No Vocation: On disability Communication Communication: Expressive difficulties;Other (comment) (word finding difficulties) Dominant Hand: Right         Vision/Perception     Cognition  Overall Cognitive Status: Impaired Area of Impairment: Attention;Memory;Safety/judgement;Awareness of deficits Arousal/Alertness: Awake/alert Orientation Level: Disoriented X4;Place;Situation;Time Behavior During Session: [facility] for tasks performed Current Attention Level: Sustained Memory Deficits: Difficulty with STM and immediate memory.   Safety/Judgement: Decreased safety judgement for tasks assessed;Impulsive;Decreased awareness of need for assistance    Extremity/Trunk Assessment Right Upper Extremity Assessment RUE ROM/Strength/Tone: Deficits RUE ROM/Strength/Tone Deficits: grossly 3/5 RUE Coordination: Deficits (shaky quality, fatigue likely contributing) Left Upper Extremity Assessment LUE ROM/Strength/Tone: Deficits LUE ROM/Strength/Tone  Deficits: grossly 3/5 LUE Coordination: Deficits (shaky quality, fatigue contributing) Right Lower Extremity Assessment RLE ROM/Strength/Tone: Deficits Trunk Assessment Trunk Assessment: Normal     Mobility Bed Mobility Bed Mobility: Supine to Sit;Sitting - Scoot to Edge of Bed Supine to Sit: 4: Min guard;HOB elevated;With rails Sitting - Scoot to Edge of Bed: 4: Min guard Transfers Transfers: Sit to Stand;Stand to Sit Sit to Stand: 1: +2 Total assist;From bed Sit to Stand: Patient Percentage: 70% Stand to Sit: 1: +2 Total assist;To chair/3-in-1 Stand to Sit: Patient Percentage: 70%     Shoulder Instructions     Exercise     Balance Balance Balance Assessed: Yes Static Sitting Balance Static Sitting - Balance Support: Feet supported Static Sitting - Level of Assistance: 5: Stand by assistance Static Sitting - Comment/# of Minutes: 5   End of Session OT - End of Session Activity Tolerance: Patient limited by fatigue Patient left: in chair;with call bell/phone within reach Nurse Communication: Mobility status;Other (comment) (visitors report pt is confused which is not his baseline)  GO     Evern Bio 09/13/2012, 2:30 PM [phone]

## 2012-09-13 NOTE — Progress Notes (Signed)
Patient  Able to answer questions appropriately and follow commands however, confused at times and reports seeing bugs several times throughout shift.  Nose bleed noted times three this shift (small amount), (times:1500,  1530 and 1700). Pressure held to site each time with no further bleeding at this time. Dr. Delford Field made aware.

## 2012-09-13 NOTE — Progress Notes (Deleted)
CRITICAL VALUE ALERT  Critical value received:  INR 5.42  Date of notification:  09/13/2012  Time of notification:  4:09 AM  Critical value read back:yes  Nurse who received alert:  Vassie Moselle  MD notified (1st page):  [email or web address]  Time of first page:  4:09 AM  MD notified (2nd page):  Time of second page:  Responding MD: detterding  Time MD responded:  4:10 AM

## 2012-09-13 NOTE — Progress Notes (Signed)
Referring provider: Isidor Holts Reason for consultation: Acute respiratory distress Chief complaint: Short of breath  [name] [name] is a 55 y.o. male former smoker admitted on 09/06/2012 with dyspnea, cough with sputum, fever and chills from PNA.  Also found to have anemia.  Had persistent fever, and worsening respiratory status prompting transfer to ICU 10/26.  PCCM consulted 10/26.  Significant PMHx of systolic CHF (EF 35 to 40%), HTN, A fib, a/p AICD  Line/tube: Peripheral IV x2 Cultures: Blood 10/24>>ng Legionella Ag 10/24>>negative Pneumococcal Ag 10/24>>negative Blood 10/26>>ng Influenza panel 10/27>>negative A and B HIV 10/27>>NR  Antibiotics: Zithromax 10/23>>10/28 Rocephin 10/23>>10/26 Cefepime 10/26>> Vancomycin 10/26>>10/29  Consultants: [facility] Elnoria Howard)  Best Practice: SUP - Protonix DVT - Chronic coumadin   Events: 10/24 Transfuse 2 units PRBC 10/25 Recurrent fever, hypoxia 10/26 Transfer to ICU  SUBJECTIVE: Started on milrinone drip.  Not requiring BiPaP  OBJECTIVE:  Blood pressure 130/68, pulse 90, temperature 97.5 F (36.4 C), temperature source Oral, resp. rate 32, height 6\' 5"  (1.956 m), weight 255 lb 1.2 oz (115.7 kg), SpO2 99.00%.  Wt Readings from Last 3 Encounters:  09/09/12 255 lb 1.2 oz (115.7 kg)  08/03/12 262 lb (118.842 kg)  05/09/12 269 lb 1.9 oz (122.072 kg)   Body mass index is 30.25 kg/(m^2).  I/O last 3 completed shifts: In: 1402.2 [P.O.:30; I.V.:1122.2; IV Piggyback:250] Out: 2765 [Urine:2765]  General - no acute distress, sitting in bed.  HEENT - moist mucous membranes Cardiac - irregular, no murmur Chest - no wheezing, cackles in the right lower base, not on left, labored breathing but speaking in full sentences.  Abd - soft, non tender, + bowel sounds Ext - no edema Neuro - normal strength Skin - no rashes  CBC Lab Results  Component Value Date   WBC 18.7* 09/13/2012   HGB 8.8* 09/13/2012   HCT 26.7* 09/13/2012   MCV 81.2 09/13/2012   PLT 255 09/13/2012   BMET Lab Results  Component Value Date   CREATININE 1.34 09/13/2012   BUN 61* 09/13/2012   NA 130* 09/13/2012   K 4.7 09/13/2012   CL 102 09/13/2012   CO2 16* 09/13/2012   LFT Lab Results  Component Value Date   ALT 13 09/10/2012   AST 19 09/10/2012   ALKPHOS 68 09/10/2012   BILITOT 1.9* 09/10/2012   ABG    Component Value Date/Time   PHART 7.489* 09/10/2012 1338   PCO2ART 24.3* 09/10/2012 1338   PO2ART 69.0* 09/10/2012 1338   HCO3 18.5* 09/10/2012 1338   TCO2 19 09/10/2012 1338   ACIDBASEDEF 4.0* 09/10/2012 1338   O2SAT 64.1 09/12/2012 1750    BNP (last 3 results)  Basename 09/10/12 0445 09/09/12 0354 09/08/12 0306  PROBNP 5889.0* 4957.0* 6800.0*     Dg Chest Port 1 View  09/13/2012  *RADIOLOGY REPORT*  Clinical Data: Shortness of breath.  Pneumonia on the right.  PORTABLE CHEST - 1 VIEW  Comparison: Chest 09/11/2012 and 09/12/2012.  Findings: Extensive airspace disease throughout the right chest which appears worst in the base is unchanged.  There is also some new left basilar airspace opacity.  Cardiomegaly is noted.  No pneumothorax or pleural effusion.  IMPRESSION: No change in right side airspace disease.  New mild appearing left basilar opacity is likely atelectasis.   Original Report Authenticated By: Bernadene Bell. Maricela Curet, M.D.    Dg Chest Port 1 View  09/12/2012  *RADIOLOGY REPORT*  Clinical Data: Shortness of breath.  Pneumonia.  PORTABLE CHEST - 1  VIEW  Comparison: Chest 09/09/2012 and 09/11/2012.  Findings: Extensive airspace disease throughout the right chest does not appear changed since yesterday's study.  There is better visualization of the left hemidiaphragm compatible with improved aeration on today's exam.  Marked enlargement of the cardiopericardial silhouette is again seen.  IMPRESSION:  1.  No change in extensive right side airspace disease most consistent with pneumonia. 2.   Improved left basilar aeration. 3.  Marked enlargement cardiopericardial silhouette compatible with cardiomegaly and/or pericardial effusion.   Original Report Authenticated By: Bernadene Bell. D'ALESSIO, M.D.     ASSESSMENT/PLAN:  PULMONOLOGY  Acute respiratory failure. Secondary to severe CAP.   CXR: 10/30: unchanged right sided airspace disease P: Supplemental oxygen to keep SpO2 > 92% BPAP prn F/u CXR PRN xopenex Will need evaluation for sleep study as outpt as he may have obstructive sleep apnea in the setting of chronic heart disease. Will start him on autocpap at night.   CARDIOVASCULAR Hx of systolic CHF, A fib, HTN, hyperlipidemia. On diltiazem drip for a-fib, currently rate controled Anticoagulated on coumadin CHF: negative balance in last 24hrs Echo: 10/28: LVEF moderately depressed, hypokinesis of inferior wall, mild LVH P: - started on milrinone to increase cardiac output. Appears to be helping with renal function. Continue at 0.125 - add lasix 40 IV x1  - spironolactone d/ced given hyperkalemia - coumadin per pharmacy - switch to diltiazem 60 q6 oral  RENAL Chronic renal insufficiency with acute increase in creatinine: now improving, baseline 1.2. Improved urine output P: Monitor BMP Monitor urine output   Hyperkalemia: resolved pm BMP  GI Advance diet  HEME Anemia. Chronic disease.  Seen by GI>>no interventions planned. P: Hemoglobin stable Am CBC  Elevated INR in setting of coumadin: likely from antibiotics No evidence of bleeding. Continue to monitor and hold coumadin  ID: Severe community acquired pneumonia Influenza panel and HIV negative Procalcitonin: 6.98>>3.7 Monitor WBC P: Continue cefepime for a total of 10 days.  D/c vanc and azithro F/u blood cultures F/u am procalcitonin  Endocrine: No acute issues Monitor CBG   BEST PRACTICE / DISPOSITION  Level of Care: ICU  - Ok to transfer to 2900 SDU Primary Service: PCCM  Consultants:  cardiology, GI  Code Status: Full  Diet: cheart healthy DVT Px: on chronic warfarin  GI Px: ppi po Skin Integrity: Intact  Social / Family: patient updated   Marena Chancy, PGY-2 Family Medicine Resident ICU Rotation  [facility] to pick up 10/31 am  Cyril Mourning MD. [facility]. [place] [facility] Pager [phone] If no response call [phone]

## 2012-09-13 NOTE — Evaluation (Signed)
Physical Therapy Evaluation Patient Details Name: [name] [name] MRN: [id number] DOB: 27-Jul-1956 Today's Date: 09/13/2012 Time: 1319-1400 PT Time Calculation (min): 41 min  PT Assessment / Plan / Recommendation Clinical Impression  pt presents with HF, PNA.  pt confused and requiring 2 person A for all OOB mobility.  pt has family at home, but per pt and friend who was visiting, family not well and unable to care for pt.  pt will need SNF at D/C.      PT Assessment  Patient needs continued PT services    Follow Up Recommendations  Post acute inpatient    Does the patient have the potential to tolerate intense rehabilitation   No, Recommend SNF  Barriers to Discharge None      Equipment Recommendations  None recommended by PT    Recommendations for Other Services     Frequency Min 3X/week    Precautions / Restrictions Precautions Precautions: Fall Restrictions Weight Bearing Restrictions: No   Pertinent Vitals/Pain Denies pain.      Mobility  Bed Mobility Bed Mobility: Supine to Sit;Sitting - Scoot to Edge of Bed Supine to Sit: 4: Min guard;HOB elevated;With rails Sitting - Scoot to Edge of Bed: 4: Min guard Details for Bed Mobility Assistance: cues for encouragement and sequencing.   Transfers Transfers: Sit to Stand;Stand to Sit;Stand Pivot Transfers Sit to Stand: 1: +2 Total assist;From bed Sit to Stand: Patient Percentage: 70% Stand to Sit: 1: +2 Total assist;To chair/3-in-1 Stand to Sit: Patient Percentage: 70% Stand Pivot Transfers: 1: +2 Total assist Stand Pivot Transfers: Patient Percentage: 70% Details for Transfer Assistance: cues for UE use, safe technique, attending to task.   Ambulation/Gait Ambulation/Gait Assistance: Not tested (comment) Stairs: No Wheelchair Mobility Wheelchair Mobility: No    Shoulder Instructions     Exercises     PT Diagnosis: Difficulty walking;Generalized weakness  PT Problem List: Decreased strength;Decreased  activity tolerance;Decreased balance;Decreased mobility;Decreased cognition;Decreased knowledge of use of DME;Decreased safety awareness;Cardiopulmonary status limiting activity PT Treatment Interventions: DME instruction;Gait training;Stair training;Functional mobility training;Therapeutic activities;Therapeutic exercise;Balance training;Cognitive remediation;Patient/family education   PT Goals Acute Rehab PT Goals PT Goal Formulation: With patient Time For Goal Achievement: 09/27/12 Potential to Achieve Goals: Good Pt will go Supine/Side to Sit: with modified independence PT Goal: Supine/Side to Sit - Progress: Goal set today Pt will go Sit to Supine/Side: with modified independence PT Goal: Sit to Supine/Side - Progress: Goal set today Pt will go Sit to Stand: with supervision PT Goal: Sit to Stand - Progress: Goal set today Pt will go Stand to Sit: with supervision PT Goal: Stand to Sit - Progress: Goal set today Pt will Ambulate: with supervision;51 - 150 feet;with rolling walker PT Goal: Ambulate - Progress: Goal set today  Visit Information  Last PT Received On: 09/13/12 Assistance Needed: +2 PT/OT Co-Evaluation/Treatment: Yes    Subjective Data  Subjective: How did that lady get over here so fast from [facility]?   Patient Stated Goal: None Stated.     Prior Functioning  Home Living Lives With: Family;Other (Comment) (2 brothers and a bedbound father, another brother lives next) Available Help at Discharge: Family Type of Home: Mobile home Home Access: Ramped entrance Bathroom Shower/Tub: Walk-in shower;Curtain Bathroom Toilet: Handicapped height Home Adaptive Equipment: Bedside commode/3-in-1;Walker - rolling;Shower chair with back;Quad cane;Other (comment) (these are available for pts use but belong to his father) Prior Function Level of Independence: Independent Able to Take Stairs?: Yes Driving: No Vocation: On disability Communication Communication: Expressive  difficulties;Other (comment) (word finding difficulties) Dominant Hand: Right    Cognition  Overall Cognitive Status: Impaired Area of Impairment: Attention;Memory;Safety/judgement;Awareness of deficits Arousal/Alertness: Awake/alert Orientation Level: Disoriented X4;Place;Situation;Time Behavior During Session: [facility] for tasks performed Current Attention Level: Sustained Memory Deficits: Difficulty with STM and immediate memory.   Safety/Judgement: Decreased safety judgement for tasks assessed;Impulsive;Decreased awareness of need for assistance    Extremity/Trunk Assessment Right Upper Extremity Assessment RUE ROM/Strength/Tone: Deficits RUE ROM/Strength/Tone Deficits: grossly 3/5 RUE Coordination: Deficits (shaky quality, fatigue likely contributing) Left Upper Extremity Assessment LUE ROM/Strength/Tone: Deficits LUE ROM/Strength/Tone Deficits: grossly 3/5 LUE Coordination: Deficits (shaky quality, fatigue contributing) Right Lower Extremity Assessment RLE ROM/Strength/Tone: Deficits RLE ROM/Strength/Tone Deficits: Generally weak, 3+/5 Left Lower Extremity Assessment LLE ROM/Strength/Tone: Deficits LLE ROM/Strength/Tone Deficits: Generally weak, 3+/5 Trunk Assessment Trunk Assessment: Normal   Balance Balance Balance Assessed: Yes Static Sitting Balance Static Sitting - Balance Support: Feet supported Static Sitting - Level of Assistance: 5: Stand by assistance Static Sitting - Comment/# of Minutes: 5  End of Session PT - End of Session Equipment Utilized During Treatment: Gait belt;Oxygen Activity Tolerance: Patient limited by fatigue Patient left: in chair;with call bell/phone within reach Nurse Communication: Mobility status  GP     Sunny Schlein, [place] [phone] 09/13/2012, 3:06 PM

## 2012-09-13 NOTE — Progress Notes (Signed)
CARDIOLOGY ROUNDING NOTE    Patient Name: [name] [name] Date of Encounter: 09-12-2012    SUBJECTIVE:Patient still short of breath but improved.   No chest pain.    TELEMETRY: Reviewed telemetry pt in atrial fibrillation, v rates 80-100 (on Cardizem gtt)- improved! Filed Vitals:   09/13/12 0500 09/13/12 0600 09/13/12 0700 09/13/12 0800  BP: 105/71 130/68 119/64 125/72  Pulse: 69 90 95 99  Temp:      TempSrc:    Oral  Resp: 31 32 29 31  Height:      Weight:      SpO2: 100% 99% 99% 98%    Intake/Output Summary (Last 24 hours) at 09/13/12 0917 Last data filed at 09/13/12 0900  Gross per 24 hour  Intake 885.07 ml  Output   1690 ml  Net -804.93 ml   GEN- The patient is ill appearing, alert and oriented x 3 today.  Head- normocephalic, atraumatic  Eyes- Sclera clear, conjunctiva pink  Ears- hearing intact  Oropharynx- clear  Neck- supple  Lungs- Clear to ausculation bilaterally with few basilar rales, normal work of breathing  Heart- irregular rate and rhythm  GI- soft, NT, ND, + BS  Extremities- no clubbing, cyanosis, + dependant edema  Skin- no rash or lesion  Psych- euthymic mood, full affect  Neuro- strength and sensation are intact  LABS: Basic Metabolic Panel:  Basename 09/13/12 0337 09/12/12 1806  NA 130* 127*  K 4.7 5.0  CL 102 100  CO2 16* 16*  GLUCOSE 204* 208*  BUN 61* 61*  CREATININE 1.34 1.56*  CALCIUM 9.0 9.0  MG -- --  PHOS -- --   Liver Function Tests: No results found for this basename: AST:2,ALT:2,ALKPHOS:2,BILITOT:2,PROT:2,ALBUMIN:2 in the last 72 hours CBC:  Basename 09/13/12 0337 09/12/12 0432  WBC 18.7* 12.5*  NEUTROABS -- --  HGB 8.8* 7.6*  HCT 26.7* 23.3*  MCV 81.2 82.9  PLT 255 139*   Cardiac Enzymes:  Basename 09/10/12 1020  CKTOTAL --  CKMB --  CKMBINDEX --  TROPONINI <0.30   INR: 4.85  Radiology/Studies:  Dg Chest Port 1 View 09/12/2012  *RADIOLOGY REPORT*  Clinical Data: Shortness of breath.  Pneumonia.   PORTABLE CHEST - 1 VIEW  Comparison: Chest 09/09/2012 and 09/11/2012.  Findings: Extensive airspace disease throughout the right chest does not appear changed since yesterday's study.  There is better visualization of the left hemidiaphragm compatible with improved aeration on today's exam.  Marked enlargement of the cardiopericardial silhouette is again seen.  IMPRESSION:  1.  No change in extensive right side airspace disease most consistent with pneumonia. 2.  Improved left basilar aeration. 3.  Marked enlargement cardiopericardial silhouette compatible with cardiomegaly and/or pericardial effusion.   Original Report Authenticated By: Bernadene Bell. Maricela Curet, M.D.      Current Medications:  . ceFEPime (MAXIPIME) IV  1 g Intravenous Q8H  . feeding supplement  1 Container Oral TID BM  . furosemide  40 mg Intravenous Daily  . guaiFENesin  600 mg Oral BID  . hydrALAZINE  50 mg Oral TID  . isosorbide mononitrate  60 mg Oral Daily  . methylPREDNISolone (SOLU-MEDROL) injection  40 mg Intravenous Q12H  . metoprolol  100 mg Oral BID  . pantoprazole  40 mg Oral Q1200  . pravastatin  40 mg Oral QPC supper  . sodium chloride  3 mL Intravenous Q12H  . spironolactone  25 mg Oral Daily  . vancomycin  1,500 mg Intravenous Q24H  . Warfarin - Pharmacist  Dosing Inpatient   Does not apply q1800   . diltiazem (CARDIZEM) infusion 15 mg/hr (09/12/12 0650)     Active Problems:  Atrial fibrillation  SYSTOLIC HEART FAILURE, CHRONIC  Anemia  Pneumonia  Cardiomyopathy, nonischemic  Hypertension  Acute respiratory failure with hypoxia    Assessment/Plan per Dr Johney Frame   1. Respiratory failure- primarily due to pneumonia, potentially worsened by CHF  Antibiotics per the primary team He was able to diurese a little better overnight with stable creatinine on milrinone  2. Afib- elevated V rates due to underlying medical illness and anemia  Rates are presently much better Wean diltiazem as able (particularly  in setting of CHF) Would treat underlying issues  I would be reluctant to use amiodarone in setting of ongoing hypoxia for rate control   3. Acute on chronic systolic dyfunction  Mildly volume overloaded, Creatinine is improved with milrinone Keep Is and Os negative  No changes today  4. Anemia- in face of acute renal failure and afib with RVR transfusion may be beneficial  Will defer to primary team   5. Acute on chronic renal failure Concerning in setting of clinical illness Would keep patient dry  Fayrene Fearing Temica Righetti,MD 09/13/2012 9:17 AM

## 2012-09-13 NOTE — Progress Notes (Signed)
Clinical Social Work Department BRIEF PSYCHOSOCIAL ASSESSMENT 09/13/2012  Patient:  [name] [name], [name] [name]     Account Number:  [id number]     Admit date:  09/06/2012  Clinical Social Worker:  Ronda Fairly, CLINICAL SOCIAL WORKER  Date/Time:  09/13/2012 01:44 PM  Referred by:  Physician  Date Referred:  09/13/2012 Referred for  SNF Placement   Other Referral:   Interview type:  Patient Other interview type:   Three friends were also visiting.  Patient gave permission for [place] to speak in front of friends.    PSYCHOSOCIAL DATA Living Status:  FAMILY Admitted from facility:   Level of care:   Primary support name:  Shanon Payor Primary support relationship to patient:  FAMILY Degree of support available:   Friend reports patient lives in home with 2 brothers and father.    CURRENT CONCERNS Current Concerns  Post-Acute Placement   Other Concerns:   Patient is from [facility] and would like placement if possible in that area of [place] etc.  Patient informed that we would access that area.    SOCIAL WORK ASSESSMENT / PLAN CSW was given orientation to process of discharge from acute car and discussion of patient's rehab was begun. Patient lives with 2 brothers and fathers and will likely need rehab.  Patient given list of area facilities and CSW will complete FL2 and fax out.   Assessment/plan status:  Psychosocial Support/Ongoing Assessment of Needs Other assessment/ plan:   Information/referral to community resources:   Snf list was provided to patient    PATIENT'S/FAMILY'S RESPONSE TO PLAN OF CARE: Patient agreeable to receiving list and expressed concern regarding how long he may need to stay there.  Patient given encouragement regarding getting him back to his ability to care for self.

## 2012-09-13 NOTE — Progress Notes (Signed)
ANTICOAGULATION CONSULT NOTE - Follow Up Consult  Pharmacy Consult for warfarin Indication: atrial fibrillation  No Known Allergies  Patient Measurements: Height: 6\' 5"  (195.6 cm) Weight: 255 lb 1.2 oz (115.7 kg) IBW/kg (Calculated) : 89.1   Vital Signs: Temp: 97.5 F (36.4 C) (10/30 0400) Temp src: Oral (10/30 0800) BP: 120/61 mmHg (10/30 0900) Pulse Rate: 92  (10/30 0900)  Labs:  Basename 09/13/12 0337 09/12/12 1806 09/12/12 0432 09/11/12 0510  HGB 8.8* -- 7.6* --  HCT 26.7* -- 23.3* --  PLT 255 -- 139* --  APTT -- -- -- --  LABPROT 44.7* -- 42.2* 31.1*  INR 5.24* -- 4.85* 3.21*  HEPARINUNFRC -- -- -- --  CREATININE 1.34 1.56* 2.17* --  CKTOTAL -- -- -- --  CKMB -- -- -- --  TROPONINI -- -- -- --    Estimated Creatinine Clearance: 88.9 ml/min (by C-G formula based on Cr of 1.34).   Medications:  Scheduled:     . ceFEPime (MAXIPIME) IV  1 g Intravenous Q8H  . feeding supplement  1 Container Oral TID BM  . guaiFENesin  600 mg Oral BID  . hydrALAZINE  50 mg Oral TID  . isosorbide mononitrate  60 mg Oral Daily  . metoprolol  100 mg Oral BID  . pantoprazole  40 mg Oral Q1200  . pravastatin  40 mg Oral QPC supper  . sodium chloride  3 mL Intravenous Q12H  . Warfarin - Pharmacist Dosing Inpatient   Does not apply q1800  . DISCONTD: vancomycin  1,500 mg Intravenous Q24H    Assessment: 56 YOM with history of Afib for which he was on warfarin PTA. His home dose was 7mg  daily except on Wednesdays and Fridays when he took 8mg . This was held due to concern of anemia and possible GI blood loss. It was restarted on 10/26 when he received 10mg  and then received 5mg  on 10/27. His INR has been supratherapeutic since 10/28 and he has not received any doses since 10/27. This morning his INR is 5.24. Hgb and platelets both increased overnight. No bleeding noted.  Goal of Therapy:  INR 2-3 Monitor platelets by anticoagulation protocol: Yes   Plan:  1. Hold warfarin again  tonight 2. Daily PT/INR  Anthoni Geerts D. Jazmarie Biever, PharmD Clinical Pharmacist Pager: [phone] Phone: [phone] 09/13/2012 10:49 AM

## 2012-09-13 NOTE — Progress Notes (Addendum)
Clinical Social Work Department CLINICAL SOCIAL WORK PLACEMENT NOTE 09/13/2012  Patient:  [name] [name], [name] [name]  Account Number:  [id number] Admit date:  09/06/2012  Clinical Social Worker:  Ronda Fairly, CLINICAL SOCIAL WORKER  Date/time:  09/13/2012 02:01 PM  Clinical Social Work is seeking post-discharge placement for this patient at the following level of care:   SKILLED NURSING   (*CSW will update this form in Epic as items are completed)   09/13/2012  Patient/family provided with [facility] Department of Clinical Social Work's list of facilities offering this level of care within the geographic area requested by the patient (or if unable, by the patient's family).  09/13/2012  Patient/family informed of their freedom to choose among providers that offer the needed level of care, that participate in Medicare, Medicaid or managed care program needed by the patient, have an available bed and are willing to accept the patient.  09/13/2012  Patient/family informed of [facility]' ownership interest in [facility], as well as of the fact that they are under no obligation to receive care at this facility.  PASARR submitted to [employer] on 09/13/2012 PASARR number received from [employer] on   FL2 transmitted to all facilities in geographic area requested by pt/family on  09/13/2012 FL2 transmitted to all facilities within larger geographic area on   Patient informed that his/her managed care company has contracts with or will negotiate with  certain facilities, including the following:     Patient/family informed of bed offers received:  09/14/2012  09/17/12 Patient chooses bed at [facility] in [place] Physician recommends and patient chooses bed at    Patient to be transferred to  on   Patient to be transferred to facility by   The following physician request were entered in Epic:   Additional Comments:

## 2012-09-13 NOTE — Progress Notes (Signed)
CRITICAL VALUE ALERT  Critical value received: INR 5.24  Date of notification:  09/13/2012  Time of notification:  4:12 AM  Critical value read back:yes  Nurse who received alert:  Vassie Moselle  MD notified (1st page):  detterding  Time of first page:  4:14 AM  MD notified (2nd page):  Time of second page:  Responding MD:  detterding  Time MD responded:  4:14 AM

## 2012-09-14 ENCOUNTER — Inpatient Hospital Stay (HOSPITAL_COMMUNITY): Payer: PRIVATE HEALTH INSURANCE

## 2012-09-14 DIAGNOSIS — D696 Thrombocytopenia, unspecified: Secondary | ICD-10-CM | POA: Diagnosis present

## 2012-09-14 DIAGNOSIS — N179 Acute kidney failure, unspecified: Secondary | ICD-10-CM | POA: Diagnosis present

## 2012-09-14 DIAGNOSIS — T45515A Adverse effect of anticoagulants, initial encounter: Secondary | ICD-10-CM | POA: Diagnosis present

## 2012-09-14 DIAGNOSIS — E872 Acidosis, unspecified: Secondary | ICD-10-CM

## 2012-09-14 DIAGNOSIS — R040 Epistaxis: Secondary | ICD-10-CM | POA: Diagnosis not present

## 2012-09-14 DIAGNOSIS — N182 Chronic kidney disease, stage 2 (mild): Secondary | ICD-10-CM | POA: Diagnosis present

## 2012-09-14 DIAGNOSIS — E669 Obesity, unspecified: Secondary | ICD-10-CM

## 2012-09-14 DIAGNOSIS — D6832 Hemorrhagic disorder due to extrinsic circulating anticoagulants: Secondary | ICD-10-CM | POA: Diagnosis present

## 2012-09-14 LAB — BASIC METABOLIC PANEL
BUN: 50 mg/dL — ABNORMAL HIGH (ref 6–23)
CO2: 20 mEq/L (ref 19–32)
Chloride: 108 mEq/L (ref 96–112)
Glucose, Bld: 105 mg/dL — ABNORMAL HIGH (ref 70–99)
Potassium: 4.7 mEq/L (ref 3.5–5.1)
Sodium: 137 mEq/L (ref 135–145)

## 2012-09-14 LAB — URINE MICROSCOPIC-ADD ON

## 2012-09-14 LAB — POCT I-STAT 3, ART BLOOD GAS (G3+)
Acid-base deficit: 1 mmol/L (ref 0.0–2.0)
Bicarbonate: 21.3 mEq/L (ref 20.0–24.0)
O2 Saturation: 82 %
Patient temperature: 103
pO2, Arterial: 47 mmHg — ABNORMAL LOW (ref 80.0–100.0)

## 2012-09-14 LAB — CBC
HCT: 24.3 % — ABNORMAL LOW (ref 39.0–52.0)
Hemoglobin: 7.9 g/dL — ABNORMAL LOW (ref 13.0–17.0)
RBC: 2.92 MIL/uL — ABNORMAL LOW (ref 4.22–5.81)
WBC: 10.3 10*3/uL (ref 4.0–10.5)

## 2012-09-14 LAB — URINALYSIS, ROUTINE W REFLEX MICROSCOPIC
Glucose, UA: NEGATIVE mg/dL
Ketones, ur: NEGATIVE mg/dL
Leukocytes, UA: NEGATIVE
pH: 5 (ref 5.0–8.0)

## 2012-09-14 LAB — PROTIME-INR: INR: 6.43 (ref 0.00–1.49)

## 2012-09-14 MED ORDER — PHYTONADIONE 5 MG PO TABS
10.0000 mg | ORAL_TABLET | Freq: Once | ORAL | Status: AC
  Administered 2012-09-14: 10 mg via ORAL
  Filled 2012-09-14: qty 2

## 2012-09-14 MED ORDER — VANCOMYCIN HCL 1000 MG IV SOLR
2000.0000 mg | Freq: Once | INTRAVENOUS | Status: AC
  Administered 2012-09-14: 2000 mg via INTRAVENOUS
  Filled 2012-09-14: qty 2000

## 2012-09-14 MED ORDER — FUROSEMIDE 10 MG/ML IJ SOLN
40.0000 mg | Freq: Four times a day (QID) | INTRAMUSCULAR | Status: AC
  Administered 2012-09-14 – 2012-09-15 (×2): 40 mg via INTRAVENOUS
  Filled 2012-09-14 (×2): qty 4

## 2012-09-14 MED ORDER — METOPROLOL TARTRATE 25 MG/10 ML ORAL SUSPENSION
12.5000 mg | Freq: Two times a day (BID) | ORAL | Status: DC
  Administered 2012-09-14 – 2012-09-16 (×4): 12.5 mg via ORAL
  Filled 2012-09-14 (×6): qty 5

## 2012-09-14 MED ORDER — VITAMIN K1 10 MG/ML IJ SOLN
10.0000 mg | Freq: Once | INTRAVENOUS | Status: AC
  Administered 2012-09-14: 10 mg via INTRAVENOUS
  Filled 2012-09-14: qty 1

## 2012-09-14 MED ORDER — VANCOMYCIN HCL IN DEXTROSE 1-5 GM/200ML-% IV SOLN
1000.0000 mg | Freq: Two times a day (BID) | INTRAVENOUS | Status: AC
  Administered 2012-09-15 – 2012-09-19 (×10): 1000 mg via INTRAVENOUS
  Filled 2012-09-14 (×11): qty 200

## 2012-09-14 MED ORDER — ENSURE COMPLETE PO LIQD
237.0000 mL | Freq: Three times a day (TID) | ORAL | Status: DC
  Administered 2012-09-14 – 2012-09-20 (×15): 237 mL via ORAL

## 2012-09-14 MED ORDER — DILTIAZEM HCL 100 MG IV SOLR
5.0000 mg/h | INTRAVENOUS | Status: AC
  Administered 2012-09-14: 15 mg/h via INTRAVENOUS
  Administered 2012-09-14: 10 mg/h via INTRAVENOUS
  Administered 2012-09-15: 15 mg/h via INTRAVENOUS
  Filled 2012-09-14 (×3): qty 100

## 2012-09-14 MED ORDER — FUROSEMIDE 10 MG/ML IJ SOLN
40.0000 mg | Freq: Once | INTRAMUSCULAR | Status: AC
  Administered 2012-09-14: 40 mg via INTRAVENOUS
  Filled 2012-09-14: qty 4

## 2012-09-14 NOTE — Progress Notes (Signed)
[facility] Progress Note [place] TEAM 1 - Stepdown/ICU TEAM   [name] [name] ZOX:[id number] DOB: 1956/06/11 DOA: 09/06/2012 PCP: [name], MD  Brief narrative: 56 year old patient with long-standing chronic severe systolic dysfunction with an EF of around 35%. He presented on 09/06/2012 with complaints of dyspnea, cough with productive sputum, fevers and chills felt to be related to pneumonia. He was also found to be anemic. Previous hemoglobin in 2008 was 13 and is now down to just above 8. He also been having persistent fevers and worsening respiratory status after admission so was transferred to the ICU on 09/09/2012. [facility] and a send care the patient because of respiratory difficulties. Since admission patient developed issues with atrial fibrillation and RVR that required cardiology consultation and the patient rate had been stabilized on diltiazem drip and he had subsequently been converted to oral Cardizem in the past 24 hours. He also continued to have respiratory problems that were associated with superimposed heart failure presumed secondary to stressors of chronic anemia and pneumonia. Because of this cardiology has started the patient on a milrinone infusion. He was deemed appropriate to transfer to step down unit on 09/13/2012. Team 1 assumed care of this patient on 09/14/2012.  Assessment/Plan: Active Problems:  Atrial fibrillation with RVR *Had continued RVR this morning with rates as high as 130 but had just received a.m. dose of oral Cardizem. Also has a beta blocker dose pending later in the day *We'll defer rate control management to cardiology *Also seems to have associated mild respiratory distress/tachypnea which may be contributing to his tachycardia as well *Milrinone could also contribute to tachycardia  Acute respiratory failure with hypoxia (multifactorial) due to:  a) Severe CAP (community acquired pneumonia) *Procalcitonin at time of admission was  greater than 6 and currently has trended downward to around 3 *Pan cultures have been negative including negative influenza panel and negative HIV screening *Initially treated with Zithromax and Rocephin, later started on cefepime and vancomycin  (see below regarding start and discharge dates)-currently only on cefepime *Continue supportive care with oxygen and pulmonary toileting *Leukocytosis has resolved as of today   b) Chronic systolic CHF (congestive heart failure), NYHA class 3 *Primary management per cardiology-patient responded well on 09/13/2012 to 1 time dose of IV Lasix and this was repeated this morning. Lasix had previously been held because of acute renal failure. *Continuing milrinone *Net I&O balance since admission is -2600 cc but with addition of milrinone and diuretics actual net diuresis is 9490 cc *Continue Imdur, metoprolol, and Apresoline   Iron deficiency anemia, unspecified *Hemoccult at presentation was negative *B12 level was greater than 500 but iron was 15  And a low TIBC and low saturation iron also present *Patient has received 2 units of packed red blood cells this admission *Hemoglobin remains between 7.5 and 8.0 *We'll repeat an anemia panel-iron remains low may benefit from IV iron infusion *TSH has been normal *Would not repeat fecal occult blood at this point since patient has had recent epistaxis which could lead to false positive results   Hypertension *Blood pressure well controlled-see above meds listed under heart failure   Warfarin-induced coagulopathy/Mild epistaxis *INR was supratherapeutic at presentation and patient was given 5 mg of vitamin K IV and Coumadin was placed on hold. *After stabilized in the ICU and determined not to be actively experiencing a GI bleed patient's Coumadin was 3 instituted but was once again placed on hold on 09/12/2012 do to supratherapeutic INR of 4.85. *As of today  INR is up to 6.43 so we have ordered vitamin K  10 mg by mouth as patient experienced mild epistaxis  *Suspect recent antibiotics contributing to supratherapeutic INR  Acute renal failure on  CKD (chronic kidney disease) stage 2, GFR 30-59 ml/min *Baseline creatinine in 2008 was 1.02 at that time of presentation BUN was slightly elevated at 32 from a baseline of 11 in 2008 with a creatinine up to 1.44. *Peak creatinine this admission was 2.17 with a BUN of 63 with current BUN/creatinine trending down to 50 and 1.08 with return of GFR to stage II chronic kidney disease level   Thrombocytopenia *Baseline platelets in 2008 were 208,000 *At admission platelets were 149,000 with a nadir this admission of 112,000 *Suspect etiology related to consumption due to ongoing anemia, coagulopathy and consumption related to infectious process/SIRS *Current platelets have normalized to 265,000  IMPLANTATION OF DEFIBRILLATOR, HX OF  DVT prophylaxis: Currently INR is supratherapeutic-warfarin on hold  Code Status:  full  Family Communication:  spoke with patient  Disposition Plan:  remain in stepdown  Consultants: Cardiology  Critical care medicine-signed off Gastroenterology-signed off  Procedures:  none   Cultures:  Blood 10/24>>ng  Legionella Ag 10/24>>negative  Pneumococcal Ag 10/24>>negative  Blood 10/26>>ng  Influenza panel 10/27>>negative A and B  HIV 10/27>>NR  Antibiotics: Zithromax 10/23>>10/28  Rocephin 10/23>>10/26  Cefepime 10/26>>  Vancomycin 10/26>>10/29   HPI/Subjective: patient is alert today but seems a little confused and restless. The nurse states he has had his breakfast tray for over an hour and has not really eaten much. He is complaining of some shortness of breath. Has no awareness of tachycardia or palpitations.    Objective: Blood pressure 113/68, pulse 94, temperature 100.1 F (37.8 C), temperature source Oral, resp. rate 40, height 6\' 5"  (1.956 m), weight 115.7 kg (255 lb 1.2 oz), SpO2  92.00%.  Intake/Output Summary (Last 24 hours) at 09/14/12 1222 Last data filed at 09/14/12 1155  Gross per 24 hour  Intake 1005.3 ml  Output   4225 ml  Net -3219.7 ml     Exam: General: No acute respiratory distress HEENT: Patient has evidence of dried blood around the nares and dry around his lips otherwise his exam is nonfocal Lungs: bilateral crackles especially in the bases, somewhat diminished in lower lungs, on room air saturating 92% but is somewhat tachypneic- respiratory rates averaging between 25 and 30 breasts per minute Cardiovascular: Irregular rate and rhythm without murmur gallop or rub normal S1 and S2, ventricular responses between 120 and 130 beats per minute, milrinone infusion, 1+ bilateral peripheral edema  Abdomen: Nontender, nondistended, soft, bowel sounds positive, no rebound, no ascites, no appreciable mass, or oral intake noted  Musculoskeletal: No significant cyanosis, clubbing of bilateral lower extremities Neurological: Patient is alert and oriented to name and seems to be oriented to place but not to year, weakly moves all extremities x4 but is unable to reposition himself in the bed without assistance, gait and ambulation were not checked, no appreciable focal neurological deficit  Data Reviewed: Basic Metabolic Panel:  Lab 09/14/12 4401 09/13/12 0337 09/12/12 1806 09/12/12 0432 09/11/12 0510 09/09/12 0354 09/08/12 1139  NA 137 130* 127* 130* 129* -- --  K 4.7 4.7 5.0 5.4* 4.9 -- --  CL 108 102 100 102 100 -- --  CO2 20 16* 16* 16* 19 -- --  GLUCOSE 105* 204* 208* 201* 122* -- --  BUN 50* 61* 61* 63* 45* -- --  CREATININE 1.08 1.34 1.56* 2.17*  2.08* -- --  CALCIUM 8.9 9.0 9.0 8.8 8.3* -- --  MG -- -- -- -- -- 2.0 2.0  PHOS -- -- -- -- -- -- --   Liver Function Tests:  Lab 09/10/12 0445 09/08/12 0306  AST 19 35  ALT 13 21  ALKPHOS 68 77  BILITOT 1.9* 2.5*  PROT 7.6 7.7  ALBUMIN 2.0* 2.1*   No results found for this basename:  LIPASE:5,AMYLASE:5 in the last 168 hours No results found for this basename: AMMONIA:5 in the last 168 hours CBC:  Lab 09/14/12 0322 09/13/12 0337 09/12/12 0432 09/10/12 1002 09/10/12 0445  WBC 10.3 18.7* 12.5* 17.2* 13.3*  NEUTROABS -- -- -- -- --  HGB 7.9* 8.8* 7.6* 8.0* 7.7*  HCT 24.3* 26.7* 23.3* 24.4* 23.5*  MCV 83.2 81.2 82.9 82.4 82.2  PLT 265 255 139* 156 124*   Cardiac Enzymes:  Lab 09/10/12 1020 09/10/12 0445 09/09/12 2236 09/09/12 2235 09/08/12 0305 09/07/12 2057  CKTOTAL -- -- 28 -- -- --  CKMB -- -- -- -- -- --  CKMBINDEX -- -- -- -- -- --  TROPONINI <0.30 <0.30 -- <0.30 <0.30 <0.30   BNP (last 3 results)  Basename 09/10/12 0445 09/09/12 0354 09/08/12 0306  PROBNP 5889.0* 4957.0* 6800.0*   CBG:  Lab 09/10/12 09/08/12 0833  GLUCAP 113* 107*    Recent Results (from the past 240 hour(s))  MRSA PCR SCREENING     Status: Normal   Collection Time   09/07/12  2:00 AM      Component Value Range Status Comment   MRSA by PCR NEGATIVE  NEGATIVE Final   CULTURE, BLOOD (ROUTINE X 2)     Status: Normal   Collection Time   09/07/12  4:35 PM      Component Value Range Status Comment   Specimen Description BLOOD LEFT ARM   Final    Special Requests BOTTLES DRAWN AEROBIC AND ANAEROBIC 10CC   Final    Culture  Setup Time 09/07/2012 22:26   Final    Culture NO GROWTH 5 DAYS   Final    Report Status 09/13/2012 FINAL   Final   CULTURE, BLOOD (ROUTINE X 2)     Status: Normal   Collection Time   09/07/12  4:45 PM      Component Value Range Status Comment   Specimen Description BLOOD RIGHT HAND   Final    Special Requests BOTTLES DRAWN AEROBIC AND ANAEROBIC 10CC   Final    Culture  Setup Time 09/07/2012 22:26   Final    Culture NO GROWTH 5 DAYS   Final    Report Status 09/13/2012 FINAL   Final   CULTURE, BLOOD (ROUTINE X 2)     Status: Normal (Preliminary result)   Collection Time   09/09/12  9:17 PM      Component Value Range Status Comment   Specimen Description BLOOD  LEFT HAND   Final    Special Requests BOTTLES DRAWN AEROBIC ONLY 5CC   Final    Culture  Setup Time 09/10/2012 01:59   Final    Culture     Final    Value:        BLOOD CULTURE RECEIVED NO GROWTH TO DATE CULTURE WILL BE HELD FOR 5 DAYS BEFORE ISSUING A FINAL NEGATIVE REPORT   Report Status PENDING   Incomplete   CULTURE, BLOOD (ROUTINE X 2)     Status: Normal (Preliminary result)   Collection Time   09/09/12  9:26  PM      Component Value Range Status Comment   Specimen Description BLOOD RIGHT HAND   Final    Special Requests BOTTLES DRAWN AEROBIC ONLY 1CC   Final    Culture  Setup Time 09/10/2012 01:59   Final    Culture     Final    Value:        BLOOD CULTURE RECEIVED NO GROWTH TO DATE CULTURE WILL BE HELD FOR 5 DAYS BEFORE ISSUING A FINAL NEGATIVE REPORT   Report Status PENDING   Incomplete   MRSA PCR SCREENING     Status: Normal   Collection Time   09/09/12 10:34 PM      Component Value Range Status Comment   MRSA by PCR NEGATIVE  NEGATIVE Final      Studies:  Recent x-ray studies have been reviewed in detail by the Attending Physician  Scheduled Meds:  Reviewed in detail by the Attending Physician   Junious Silk, ANP [facility] Office  [phone] Pager [phone]  On-Call/Text Page:      [email or web address]      password [email or web address]  If 7PM-7AM, please contact night-coverage [email or web address] Password [email or web address] 09/14/2012, 12:22 PM   LOS: 8 days   I have examined the patient, reviewed the chart and discussed the plan with PCCM- they will take over care for now.  Calvert Cantor, MD [phone]

## 2012-09-14 NOTE — Progress Notes (Signed)
ANTIBIOTIC CONSULT NOTE - INITIAL  Pharmacy Consult for Vancomycin Indication: broadening antibiotic coverage due to Temp spike s/p aspiration event  No Known Allergies  Patient Measurements: Height: 6\' 5"  (195.6 cm) Weight: 246 lb 14.6 oz (112 kg) IBW/kg (Calculated) : 89.1  Adjusted Body Weight:   Vital Signs: Temp: 103 F (39.4 C) (10/31 1515) Temp src: Oral (10/31 1515) BP: 126/65 mmHg (10/31 1545) Pulse Rate: 89  (10/31 1545) Intake/Output from previous day: 10/30 0701 - 10/31 0700 In: 778.9 [I.V.:628.9; IV Piggyback:150] Out: 2550 [Urine:2550] Intake/Output from this shift: Total I/O In: 639.4 [P.O.:410; I.V.:174.4; IV Piggyback:55] Out: 2625 [Urine:2625]  Labs:  Basename 09/14/12 0322 09/13/12 0337 09/12/12 1806 09/12/12 0432  WBC 10.3 18.7* -- 12.5*  HGB 7.9* 8.8* -- 7.6*  PLT 265 255 -- 139*  LABCREA -- -- -- --  CREATININE 1.08 1.34 1.56* --   Estimated Creatinine Clearance: 108.7 ml/min (by C-G formula based on Cr of 1.08). No results found for this basename: VANCOTROUGH:2,VANCOPEAK:2,VANCORANDOM:2,GENTTROUGH:2,GENTPEAK:2,GENTRANDOM:2,TOBRATROUGH:2,TOBRAPEAK:2,TOBRARND:2,AMIKACINPEAK:2,AMIKACINTROU:2,AMIKACIN:2, in the last 72 hours   Microbiology: Recent Results (from the past 720 hour(s))  MRSA PCR SCREENING     Status: Normal   Collection Time   09/07/12  2:00 AM      Component Value Range Status Comment   MRSA by PCR NEGATIVE  NEGATIVE Final   CULTURE, BLOOD (ROUTINE X 2)     Status: Normal   Collection Time   09/07/12  4:35 PM      Component Value Range Status Comment   Specimen Description BLOOD LEFT ARM   Final    Special Requests BOTTLES DRAWN AEROBIC AND ANAEROBIC 10CC   Final    Culture  Setup Time 09/07/2012 22:26   Final    Culture NO GROWTH 5 DAYS   Final    Report Status 09/13/2012 FINAL   Final   CULTURE, BLOOD (ROUTINE X 2)     Status: Normal   Collection Time   09/07/12  4:45 PM      Component Value Range Status Comment   Specimen Description BLOOD RIGHT HAND   Final    Special Requests BOTTLES DRAWN AEROBIC AND ANAEROBIC 10CC   Final    Culture  Setup Time 09/07/2012 22:26   Final    Culture NO GROWTH 5 DAYS   Final    Report Status 09/13/2012 FINAL   Final   CULTURE, BLOOD (ROUTINE X 2)     Status: Normal (Preliminary result)   Collection Time   09/09/12  9:17 PM      Component Value Range Status Comment   Specimen Description BLOOD LEFT HAND   Final    Special Requests BOTTLES DRAWN AEROBIC ONLY 5CC   Final    Culture  Setup Time 09/10/2012 01:59   Final    Culture     Final    Value:        BLOOD CULTURE RECEIVED NO GROWTH TO DATE CULTURE WILL BE HELD FOR 5 DAYS BEFORE ISSUING A FINAL NEGATIVE REPORT   Report Status PENDING   Incomplete   CULTURE, BLOOD (ROUTINE X 2)     Status: Normal (Preliminary result)   Collection Time   09/09/12  9:26 PM      Component Value Range Status Comment   Specimen Description BLOOD RIGHT HAND   Final    Special Requests BOTTLES DRAWN AEROBIC ONLY 1CC   Final    Culture  Setup Time 09/10/2012 01:59   Final    Culture  Final    Value:        BLOOD CULTURE RECEIVED NO GROWTH TO DATE CULTURE WILL BE HELD FOR 5 DAYS BEFORE ISSUING A FINAL NEGATIVE REPORT   Report Status PENDING   Incomplete   MRSA PCR SCREENING     Status: Normal   Collection Time   09/09/12 10:34 PM      Component Value Range Status Comment   MRSA by PCR NEGATIVE  NEGATIVE Final     Medical History: Past Medical History  Diagnosis Date  . Cardiomyopathy, nonischemic     EF 35-40% BY 2D ECHO, h/o clinical congestive heart failure; h/o cardiac arrest for which an ICD was implanted in 1998  . Hypertension   . AF (atrial fibrillation)     permanent, on coumadin  . Cardiac arrest 1998    ICD [facility] at that time  . 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 ceFEPime (MAXIPIME) IV  1 g Intravenous Q8H  . feeding  supplement  237 mL Oral TID BM  . furosemide  40 mg Intravenous Once  . furosemide  40 mg Intravenous Q6H  . guaiFENesin  600 mg Oral BID  . isosorbide mononitrate  60 mg Oral Daily  . metoprolol tartrate  12.5 mg Oral BID  . pantoprazole  40 mg Oral Q1200  . phytonadione (VITAMIN K) IV  10 mg Intravenous Once  . phytonadione  10 mg Oral Once  . pravastatin  40 mg Oral QPC supper  . sodium chloride  3 mL Intravenous Q12H  . Warfarin - Pharmacist Dosing Inpatient   Does not apply q1800  . DISCONTD: diltiazem  60 mg Oral QID  . DISCONTD: feeding supplement  1 Container Oral TID BM  . DISCONTD: hydrALAZINE  50 mg Oral TID  . DISCONTD: metoprolol  100 mg Oral BID   Assessment: 56yo male on Cefepime day #5 of currently planned 7 days of therapy, now with T 103.8 s/p probable aspiration following episode of epistaxis today.  To resume Vancomycin.  His last dose of Vanc was on 10/28 at 8PM.  His Cr has improved since that time to 1.08 this AM, est CrCl ~ 163ml/min.  Goal of Therapy:  Vancomycin trough level 15-20 mcg/ml  Plan:  1.  Vancomycin 2000mg  IV x 1, then 1000mg  IV q12 2.  Check Vancomycin trough at steady-state  Marisue Humble, PharmD Clinical Pharmacist [facility]

## 2012-09-14 NOTE — Progress Notes (Signed)
Pt had a nose bleed at 0630. Reported nose bleed to Harless Nakayama RN. Pt is no longer bleeding.

## 2012-09-14 NOTE — Progress Notes (Signed)
eLink Physician-Brief Progress Note Patient Name: [name] [name] DOB: July 31, 1956 MRN: [id number]  Date of Service  09/14/2012   HPI/Events of Note  Coagulopathy on coumadin (being held)   Lab 09/14/12 0322 09/13/12 0337 09/12/12 0432 09/11/12 0510 09/10/12 0445  INR 6.43* 5.24* 4.85* 3.21* 2.47*    eICU Interventions  Will likely need a dose of Vit K given the trend. No active bleeding so I will wait and defer to the primary service to address in the am   Intervention Category Intermediate Interventions: Diagnostic test evaluation  Jemina Scahill S. 09/14/2012, 5:25 AM

## 2012-09-14 NOTE — Progress Notes (Signed)
cCRITICAL VALUE ALERT  Critical value received:  INR 6.43  Date of notification:  09/14/2012  Time of notification: 5:10 AM  Critical value read back:yes  Nurse who received alert:  Vassie Moselle  MD notified (1st page):  Harless Nakayama RN for Dr. Delton Coombes  Time of first page:  5:12 AM  MD notified (2nd page):  Time of second page:  Responding MD:  Harless Nakayama RN for Dr. Delton Coombes   Time MD responded:  5:13 AM

## 2012-09-14 NOTE — Progress Notes (Signed)
Patient seen in room, patient drowsy and agreeable to CSWs contacting family about bed offers for SNF.  Call placed to brother Jonny Ruiz at [phone]. Brother reports family is likely to choose [place] or [place] patient as they will be closer vicinity to family.  Family will discuss and visit patient tomorrow.   Carney Bern, LCSWA

## 2012-09-14 NOTE — Progress Notes (Signed)
Pt arrived to 2900 room 2926 from unit 2100. Pt is lethargic with labored, tachypnic breathing. He arrived on room air and spo2 ws 86%. O2 was applied and spo2 increased to 96%.  HR is 110, a fib with multifocal PVC's.  Temp 103.8.  Pt is able to answer some questions and follows directions but is confused to time, place and situation.  Family is at the bedside.  Dr. Butler Denmark was called and new orders were received.  Dr. Molli Knock is at the bedside.

## 2012-09-14 NOTE — Progress Notes (Signed)
CARDIOLOGY ROUNDING NOTE    Patient Name: DERK DOUBEK Date of Encounter: 09-12-2012    SUBJECTIVE:Patient still short of breath but improved.   No chest pain.    TELEMETRY: Reviewed telemetry pt in atrial fibrillation, v rates 90-100 (on Cardizem PO)  Filed Vitals:   09/14/12 0400 09/14/12 0500 09/14/12 0600 09/14/12 0700  BP: 124/58 123/62 126/60 133/80  Pulse: 91 112 103 91  Temp:      TempSrc:      Resp: 22 30 29 29   Height:      Weight:      SpO2: 94% 94% 94% 93%    Intake/Output Summary (Last 24 hours) at 09/14/12 0741 Last data filed at 09/14/12 0700  Gross per 24 hour  Intake  778.9 ml  Output   2550 ml  Net -1771.1 ml   GEN- The patient is ill appearing, alert and oriented x 3 today.  Head- normocephalic, atraumatic  Eyes- Sclera clear, conjunctiva pink  Ears- hearing intact  Oropharynx- clear  Neck- supple  Lungs- Clear to ausculation bilaterally with few basilar rales, normal work of breathing  Heart- irregular rate and rhythm  GI- soft, NT, ND, + BS  Extremities- no clubbing, cyanosis, + dependant edema  Skin- no rash or lesion  Psych- euthymic mood, full affect  Neuro- strength and sensation are intact  LABS: Basic Metabolic Panel:  Basename 09/14/12 0322 09/13/12 0337  NA 137 130*  K 4.7 4.7  CL 108 102  CO2 20 16*  GLUCOSE 105* 204*  BUN 50* 61*  CREATININE 1.08 1.34  CALCIUM 8.9 9.0  MG -- --  PHOS -- --   Liver Function Tests: No results found for this basename: AST:2,ALT:2,ALKPHOS:2,BILITOT:2,PROT:2,ALBUMIN:2 in the last 72 hours CBC:  Basename 09/14/12 0322 09/13/12 0337  WBC 10.3 18.7*  NEUTROABS -- --  HGB 7.9* 8.8*  HCT 24.3* 26.7*  MCV 83.2 81.2  PLT 265 255   Cardiac Enzymes: No results found for this basename: CKTOTAL:3,CKMB:3,CKMBINDEX:3,TROPONINI:3 in the last 72 hours INR: 4.85  Radiology/Studies:  Dg Chest Port 1 View 09/12/2012  *RADIOLOGY REPORT*  Clinical Data: Shortness of breath.  Pneumonia.   PORTABLE CHEST - 1 VIEW  Comparison: Chest 09/09/2012 and 09/11/2012.  Findings: Extensive airspace disease throughout the right chest does not appear changed since yesterday's study.  There is better visualization of the left hemidiaphragm compatible with improved aeration on today's exam.  Marked enlargement of the cardiopericardial silhouette is again seen.  IMPRESSION:  1.  No change in extensive right side airspace disease most consistent with pneumonia. 2.  Improved left basilar aeration. 3.  Marked enlargement cardiopericardial silhouette compatible with cardiomegaly and/or pericardial effusion.   Original Report Authenticated By: Bernadene Bell. Maricela Curet, M.D.      Current Medications:  . ceFEPime (MAXIPIME) IV  1 g Intravenous Q8H  . feeding supplement  1 Container Oral TID BM  . furosemide  40 mg Intravenous Daily  . guaiFENesin  600 mg Oral BID  . hydrALAZINE  50 mg Oral TID  . isosorbide mononitrate  60 mg Oral Daily  . methylPREDNISolone (SOLU-MEDROL) injection  40 mg Intravenous Q12H  . metoprolol  100 mg Oral BID  . pantoprazole  40 mg Oral Q1200  . pravastatin  40 mg Oral QPC supper  . sodium chloride  3 mL Intravenous Q12H  . spironolactone  25 mg Oral Daily  . vancomycin  1,500 mg Intravenous Q24H  . Warfarin - Pharmacist Dosing Inpatient   Does  not apply q1800   . diltiazem (CARDIZEM) infusion 15 mg/hr (09/12/12 0650)     Active Problems:  Atrial fibrillation  SYSTOLIC HEART FAILURE, CHRONIC  Anemia  Pneumonia  Cardiomyopathy, nonischemic  Hypertension  Acute respiratory failure with hypoxia    Assessment/Plan per Dr Johney Frame   1. Respiratory failure- primarily due to pneumonia, potentially worsened by CHF  Antibiotics per the primary team He is doing better on milronine  2. Afib- elevated V rates due to underlying medical illness and anemia  Rates are presently stable on PO cardizem Would treat underlying issues (anemia, infection, resp failure, etc) Coumadin  on hold  3. Acute on chronic systolic dyfunction  Mildly volume overloaded, Creatinine is improved with milrinone Keep Is and Os negative  IV lasix again today (40mg )  4. Anemia- in face of acute renal failure and afib with RVR transfusion may be beneficial  Will defer to primary team   5. Acute on chronic renal failure Concerning in setting of clinical illness Would keep patient dry Spironolactone is on hold  Ascencion Stegner,MD 09/14/2012 7:41 AM

## 2012-09-14 NOTE — Progress Notes (Signed)
Referring provider: Isidor Holts Reason for consultation: Acute respiratory distress Chief complaint: Short of breath  [name] [name] is a 56 y.o. male former smoker admitted on 09/06/2012 with dyspnea, cough with sputum, fever and chills from PNA.  Also found to have anemia.  Had persistent fever, and worsening respiratory status prompting transfer to ICU 10/26.  PCCM consulted 10/26.  Significant PMHx of systolic CHF (EF 35 to 40%), HTN, A fib, a/p AICD  Line/tube: Peripheral IV x2  Cultures: Blood 10/24>>ng Legionella Ag 10/24>>negative Pneumococcal Ag 10/24>>negative Blood 10/26>>ng Influenza panel 10/27>>negative A and B HIV 10/27>>NR  Antibiotics: Zithromax 10/23>>10/28 Rocephin 10/23>>10/26 Cefepime 10/26>> Vancomycin 10/26>>10/29>>>10/31>>>  Consultants: [facility] Elnoria Howard)  Best Practice: SUP - Protonix DVT - Chronic coumadin   Events: 10/24 Transfuse 2 units PRBC 10/25 Recurrent fever, hypoxia 10/26 Transfer to ICU  SUBJECTIVE: Now very SOB  OBJECTIVE:  Blood pressure 126/65, pulse 89, temperature 103 F (39.4 C), temperature source Oral, resp. rate 38, height 6\' 5"  (1.956 m), weight 112 kg (246 lb 14.6 oz), SpO2 97.00%.  Wt Readings from Last 3 Encounters:  09/14/12 112 kg (246 lb 14.6 oz)  08/03/12 118.842 kg (262 lb)  05/09/12 122.072 kg (269 lb 1.9 oz)   Body mass index is 29.28 kg/(m^2).  I/O last 3 completed shifts: In: 1245.5 [I.V.:995.5; IV Piggyback:250] Out: 3325 [Urine:3325]  General - no acute distress, sitting in bed.  HEENT - moist mucous membranes Cardiac - irregular, no murmur Chest - labored, increase WOB, bilateral rales.  Abd - soft, non tender, + bowel sounds Ext - no edema Neuro - normal strength Skin - no rashes   ASSESSMENT/PLAN:  PULMONOLOGY ABG    Component Value Date/Time   PHART 7.489* 09/10/2012 1338   PCO2ART 24.3* 09/10/2012 1338   PO2ART 69.0* 09/10/2012 1338   HCO3 18.5* 09/10/2012  1338   TCO2 19 09/10/2012 1338   ACIDBASEDEF 4.0* 09/10/2012 1338   O2SAT 64.1 09/12/2012 1750   A:  Acute respiratory failure. Secondary to severe CAP Probable aspiration on 10/31 Has Right > left airspace disease, now with increased WOB after what was likely an acute aspiration event during the period of epistaxis. P: Supplemental oxygen to keep SpO2 > 92% F/u CXR PRN xopenex See ID section Not candidate for BIPAP as has very poor cough and ? Of airway protection. Will need evaluation for sleep study as outpt as he may have obstructive sleep apnea in the setting of chronic heart disease. Will start him on autocpap at night.   CARDIOVASCULAR  Lab 09/10/12 0445 09/09/12 0354 09/08/12 0306  PROBNP 5889.0* 4957.0* 6800.0*   Hx of systolic CHF, A fib, HTN, hyperlipidemia. Echo: 10/28: LVEF moderately depressed, hypokinesis of inferior wall, mild LVH Currently rate controlled, holding coumadin in setting of epistaxis and coagulopathy.  Started milrinone on 10/30, appears to be helping.  P: - Cont on milrinone Continue at 0.125 - Spironolactone d/ced given hyperkalemia - Coumadin off, see heme section. - Start cardizem drip. - Minimize beta blockers, metoprolol from 100 to 12.5 BID inorder to avoid rebound tachy.  RENAL  Lab 09/14/12 0322 09/13/12 0337 09/12/12 1806  NA 137 130* 127*  CL 108 102 100  K 4.7 4.7 5.0  CO2 20 16* 16*  BUN 50* 61* 61*  CREATININE 1.08 1.34 1.56*  Acute on Chronic renal failure baseline 1.2. Improved urine output and scr over last few days. Seems to be tolerating diuresis.  P: Monitor BMP/recheck now. Monitor urine output. Lasix x3  doses ordered with FFP transfusion.  GI  Lab 09/10/12 0445 09/08/12 0306  ALT 13 21  AST 19 35  GGT -- --  ALKPHOS 68 77  BILITOT 1.9* 2.5*   A:no acute issues, but is risk for aspiration  P:  NPO except for meds.  HEME  Lab Results  Component Value Date   INR 6.43* 09/14/2012   INR 5.24* 09/13/2012    INR 4.85* 09/12/2012    Lab 09/14/12 0322 09/13/12 0337 09/12/12 0432  HGB 7.9* 8.8* 7.6*   A: Anemia of chronic disease,  W/ acute blood loss anemia due to epistaxis in setting of coagulopathy 10/31 Seen by GI>>no interventions planned Elevated INR in setting of coumadin: likely from antibiotics, epistaxis resolved.  Plan: Cont to monitor. Transfuse for Hgb <7 FFP x4 unit. Hold coumadin. Vit K.  ID:  Lab 09/14/12 0322 09/13/12 1191 09/12/12 0432 09/11/12 0510 09/10/12 1002  PROCALCITON -- 3.71 6.10 6.98 --  WBC 10.3 18.7* 12.5* -- 17.2*  LATICACIDVEN -- -- -- -- --  A: community acquired pneumonia, now possible aspiration on 10/31 after episode of epistaxis Influenza panel and HIV negative P: Continue cefepime, adding back vanc. Trend PCT. Vancomycin ordered.  Endocrine: CBG (last 3)  No results found for this basename: GLUCAP:3 in the last 72 hours No acute issues P: Monitor CBG.  BEST PRACTICE / DISPOSITION  Level of Care: ICU  - Ok to transfer to 2900 SDU Primary Service: PCCM  Consultants: cardiology, GI  Code Status: Full  Diet: cheart healthy DVT Px: on chronic warfarin  GI Px: ppi po Skin Integrity: Intact  Social / Family: patient updated  CC time 55 min.  Will transfer to the ICU, cardiazem drip and metoprolol PO, hold hydralazine and PO cardizem, broaden abx spectrum, cover aspiration, transfuse 4 units FFP with F/U INR, no BiPAP (will aspirate, if deteriorates then intubate), vit K given, once INR normalize will likely need a TLC, lasix with FFP and hold anti-coagulation.  Will monitor in the ICU.  Patient seen and examined, agree with above note.  I dictated the care and orders written for this patient under my direction.  Koren Bound, M.D. [phone]

## 2012-09-14 NOTE — Progress Notes (Signed)
ANTICOAGULATION CONSULT NOTE - Follow Up Consult  Pharmacy Consult for warfarin Indication: atrial fibrillation  No Known Allergies  Patient Measurements: Height: 6\' 5"  (195.6 cm) Weight: 255 lb 1.2 oz (115.7 kg) IBW/kg (Calculated) : 89.1   Vital Signs: Temp: 100.1 F (37.8 C) (10/31 0805) Temp src: Oral (10/31 0805) BP: 140/78 mmHg (10/31 0900) Pulse Rate: 94  (10/31 0900)  Labs:  Basename 09/14/12 0322 09/13/12 0337 09/12/12 1806 09/12/12 0432  HGB 7.9* 8.8* -- --  HCT 24.3* 26.7* -- 23.3*  PLT 265 255 -- 139*  APTT -- -- -- --  LABPROT 52.0* 44.7* -- 42.2*  INR 6.43* 5.24* -- 4.85*  HEPARINUNFRC -- -- -- --  CREATININE 1.08 1.34 1.56* --  CKTOTAL -- -- -- --  CKMB -- -- -- --  TROPONINI -- -- -- --    Estimated Creatinine Clearance: 110.3 ml/min (by C-G formula based on Cr of 1.08).   Medications:  Scheduled:     . ceFEPime (MAXIPIME) IV  1 g Intravenous Q8H  . diltiazem  60 mg Oral QID  . feeding supplement  1 Container Oral TID BM  . furosemide  40 mg Intravenous Once  . furosemide  40 mg Intravenous Once  . guaiFENesin  600 mg Oral BID  . hydrALAZINE  50 mg Oral TID  . isosorbide mononitrate  60 mg Oral Daily  . metoprolol  100 mg Oral BID  . pantoprazole  40 mg Oral Q1200  . phytonadione  10 mg Oral Once  . pravastatin  40 mg Oral QPC supper  . sodium chloride  3 mL Intravenous Q12H  . Warfarin - Pharmacist Dosing Inpatient   Does not apply q1800    Assessment: 16 YOM with history of Afib for which he was on warfarin PTA. His home dose was 7mg  daily except on Wednesdays and Fridays when he took 8mg . This was held due to concern of anemia and possible GI blood loss. It was restarted on 10/26 when he received 10mg  and then received 5mg  on 10/27. His INR has been supratherapeutic since 10/28 and he has not received any doses since 10/27. This morning his INR is 6.43. Hgb decreased slightly overnight and platelets both increased. Has had 4 nosebleeds  since 10/30 at 1500. Received Vitamin K 10mg  PO x1.  Goal of Therapy:  INR 2-3 Monitor platelets by anticoagulation protocol: Yes   Plan:  1. Hold warfarin again tonight 2. Daily PT/INR  Maddyx Wieck D. Shaneika Rossa, PharmD Clinical Pharmacist Pager: [phone] Phone: [phone] 09/14/2012 11:00 AM

## 2012-09-14 NOTE — Progress Notes (Signed)
Nutrition Follow-up  Intervention:    [employer].  Try Ensure Complete PO TID to provide more calories and protein per serving; each supplement provides 350 kcal and 13 grams of protein.  Assessment:   Patient was transferred to the ICU with worsening respiratory status on 10/26.  Receiving milrinone to increase cardiac output.  UOP is improving.  PO intake is poor due to SOB and poor appetite.  Likes the [employer] okay; drinks one daily.  RN reports that patient refused the supplement this morning and ate poorly at breakfast.   Diet Order:  Heart Healthy; Resource Breeze TID  PO's:  10% meal completion  Meds: Scheduled Meds:   . ceFEPime (MAXIPIME) IV  1 g Intravenous Q8H  . diltiazem  60 mg Oral QID  . feeding supplement  1 Container Oral TID BM  . furosemide  40 mg Intravenous Once  . furosemide  40 mg Intravenous Once  . guaiFENesin  600 mg Oral BID  . hydrALAZINE  50 mg Oral TID  . isosorbide mononitrate  60 mg Oral Daily  . metoprolol  100 mg Oral BID  . pantoprazole  40 mg Oral Q1200  . phytonadione  10 mg Oral Once  . pravastatin  40 mg Oral QPC supper  . sodium chloride  3 mL Intravenous Q12H  . Warfarin - Pharmacist Dosing Inpatient   Does not apply q1800   Continuous Infusions:   . diltiazem (CARDIZEM) infusion 10 mg/hr (09/13/12 0628)  . milrinone 0.125 mcg/kg/min (09/14/12 0841)   PRN Meds:.sodium chloride, acetaminophen, levalbuterol, sodium chloride   CMP     Component Value Date/Time   NA 137 09/14/2012 0322   K 4.7 09/14/2012 0322   CL 108 09/14/2012 0322   CO2 20 09/14/2012 0322   GLUCOSE 105* 09/14/2012 0322   BUN 50* 09/14/2012 0322   CREATININE 1.08 09/14/2012 0322   CALCIUM 8.9 09/14/2012 0322   PROT 7.6 09/10/2012 0445   ALBUMIN 2.0* 09/10/2012 0445   AST 19 09/10/2012 0445   ALT 13 09/10/2012 0445   ALKPHOS 68 09/10/2012 0445   BILITOT 1.9* 09/10/2012 0445   GFRNONAA 76* 09/14/2012 0322   GFRAA 88* 09/14/2012 0322      CBG (last 3)  No results found for this basename: GLUCAP:3 in the last 72 hours   Intake/Output Summary (Last 24 hours) at 09/14/12 1155 Last data filed at 09/14/12 1155  Gross per 24 hour  Intake 1039.6 ml  Output   4225 ml  Net -3185.4 ml    Weight Status:  115.7 kg (10/26); no more recent weight available; suspect weight is fluctuating with fluid status.  Re-estimated needs:  2300-2400 kcals, 100-120 gm protein  Nutrition Dx:  Inadequate protein energy intake now related to poor appetite/SOB as evidenced by 10% meal completion.  Goal:  Oral intake with meals & supplements to meet >/= 90% of estimated nutrition needs, unmet.  Monitor:  PO intake, labs, weight trend.   Joaquin Courts, RD, LDN, CNSC Pager# [phone] After Hours Pager# [phone]

## 2012-09-14 NOTE — Progress Notes (Addendum)
LATE ENTRY:  Transfer report given to Theodora Blow RN at 1430, transferred to 2100 via bed transfer at approximately 1500, patient has been tachypneic throughout the shift, respiratory rates in the 30s today, SOB somewhat improved after diuresis, patient has been oriented x 2 today but easily falls off to sleep, applied oxygen at 2 LPM [place] early this am for c/o SOB, but sats acceptable on RA at that time, orders for transfer to SDU were carried out, when patient arrived to 2900 and transferred onto their bed, patient felt very warm to touch, see 2900 notes,note, patient had no epitaxis this shift, dried blood observed on lips this am on first assessment, reportedly three episodes of epitaxis during the pm shift, Berle Mull RN

## 2012-09-15 ENCOUNTER — Inpatient Hospital Stay (HOSPITAL_COMMUNITY): Payer: PRIVATE HEALTH INSURANCE

## 2012-09-15 LAB — CBC
HCT: 24 % — ABNORMAL LOW (ref 39.0–52.0)
Hemoglobin: 7.7 g/dL — ABNORMAL LOW (ref 13.0–17.0)
MCH: 27.5 pg (ref 26.0–34.0)
MCH: 27.2 pg (ref 26.0–34.0)
MCHC: 32.5 g/dL (ref 30.0–36.0)
MCHC: 32.1 g/dL (ref 30.0–36.0)
MCV: 84.8 fL (ref 78.0–100.0)
Platelets: 239 K/uL (ref 150–400)
RBC: 2.83 MIL/uL — ABNORMAL LOW (ref 4.22–5.81)
RDW: 17.4 % — ABNORMAL HIGH (ref 11.5–15.5)
RDW: 17 % — ABNORMAL HIGH (ref 11.5–15.5)
WBC: 13.4 K/uL — ABNORMAL HIGH (ref 4.0–10.5)

## 2012-09-15 LAB — BASIC METABOLIC PANEL
Calcium: 9 mg/dL (ref 8.4–10.5)
GFR calc Af Amer: >90 mL/min (ref >90)
GFR calc non Af Amer: 83 mL/min — ABNORMAL LOW (ref >90)
Potassium: 3.8 mEq/L (ref 3.5–5.1)
Sodium: 138 mEq/L (ref 135–145)

## 2012-09-15 LAB — POCT I-STAT 3, ART BLOOD GAS (G3+)
Acid-base deficit: 1 mmol/L (ref 0.0–2.0)
Bicarbonate: 22.4 mEq/L (ref 20.0–24.0)
Patient temperature: 98.6
TCO2: 23 mmol/L (ref 0–100)
pH, Arterial: 7.468 — ABNORMAL HIGH (ref 7.350–7.450)
pO2, Arterial: 96 mmHg (ref 80.0–100.0)

## 2012-09-15 LAB — RETICULOCYTES
RBC.: 2.36 MIL/uL — ABNORMAL LOW (ref 4.22–5.81)
Retic Count, Absolute: 18.9 10*3/uL — ABNORMAL LOW (ref 19.0–186.0)
Retic Ct Pct: 0.8 % (ref 0.4–3.1)

## 2012-09-15 LAB — IRON AND TIBC: UIBC: 158 ug/dL (ref 125–400)

## 2012-09-15 LAB — FOLATE: Folate: 6.4 ng/mL

## 2012-09-15 LAB — PREPARE RBC (CROSSMATCH)

## 2012-09-15 LAB — PHOSPHORUS: Phosphorus: 3.4 mg/dL (ref 2.3–4.6)

## 2012-09-15 LAB — FERRITIN: Ferritin: 1315 ng/mL — ABNORMAL HIGH (ref 22–322)

## 2012-09-15 LAB — PROTIME-INR: Prothrombin Time: 19.1 seconds — ABNORMAL HIGH (ref 11.6–15.2)

## 2012-09-15 MED ORDER — FUROSEMIDE 10 MG/ML IJ SOLN: 40.0000 mg | Freq: Once | INTRAMUSCULAR | Status: DC

## 2012-09-15 MED ORDER — DILTIAZEM HCL 30 MG PO TABS
30.0000 mg | ORAL_TABLET | Freq: Four times a day (QID) | ORAL | Status: DC
Start: 2012-09-15 — End: 2012-09-15
  Administered 2012-09-15: 30 mg via ORAL
  Filled 2012-09-15 (×6): qty 1

## 2012-09-15 MED ORDER — DILTIAZEM HCL 60 MG PO TABS
60.0000 mg | ORAL_TABLET | Freq: Once | ORAL | Status: AC
  Administered 2012-09-15: 60 mg via ORAL
  Filled 2012-09-15: qty 1

## 2012-09-15 MED ORDER — FUROSEMIDE 10 MG/ML IJ SOLN
40.0000 mg | Freq: Three times a day (TID) | INTRAMUSCULAR | Status: AC
  Administered 2012-09-15 (×2): 40 mg via INTRAVENOUS
  Filled 2012-09-15 (×2): qty 4

## 2012-09-15 MED ORDER — FUROSEMIDE 10 MG/ML IJ SOLN
40.0000 mg | Freq: Three times a day (TID) | INTRAMUSCULAR | Status: DC
Start: 2012-09-15 — End: 2012-09-15

## 2012-09-15 MED ORDER — POTASSIUM CHLORIDE CRYS ER 20 MEQ PO TBCR
40.0000 meq | EXTENDED_RELEASE_TABLET | Freq: Two times a day (BID) | ORAL | Status: AC
  Administered 2012-09-15 (×2): 40 meq via ORAL
  Filled 2012-09-15 (×2): qty 2

## 2012-09-15 MED ORDER — DILTIAZEM HCL 90 MG PO TABS
90.0000 mg | ORAL_TABLET | Freq: Four times a day (QID) | ORAL | Status: DC
  Administered 2012-09-15 – 2012-09-16 (×3): 90 mg via ORAL
  Filled 2012-09-15 (×7): qty 1

## 2012-09-15 MED ORDER — FUROSEMIDE 10 MG/ML IJ SOLN
40.0000 mg | Freq: Once | INTRAMUSCULAR | Status: AC
  Administered 2012-09-15: 40 mg via INTRAVENOUS
  Filled 2012-09-15: qty 4

## 2012-09-15 NOTE — Progress Notes (Signed)
Patient Name: [name] [name] Date of Encounter: 09/15/2012  Active Problems:  Atrial fibrillation with RVR  Chronic systolic CHF (congestive heart failure), NYHA class 3  IMPLANTATION OF DEFIBRILLATOR, HX OF  Iron deficiency anemia, unspecified  CAP (community acquired pneumonia)  Cardiomyopathy, nonischemic  Hypertension  Acute respiratory failure with hypoxia  Warfarin-induced coagulopathy  Mild epistaxis  CKD (chronic kidney disease) stage 2, GFR 60-89 ml/min  Acute renal failure  Thrombocytopenia    SUBJECTIVE: Pt appears to be SOB but says his breathing is a little better today. No chest pain. Eating poorly.  OBJECTIVE Filed Vitals:   09/15/12 0600 09/15/12 0630 09/15/12 0700 09/15/12 0800  BP:  122/56 121/93 120/60  Pulse: 120 95 90 89  Temp:  98.3 F (36.8 C)  98 F (36.7 C)  TempSrc:  Oral  Oral  Resp: 28 26 30 28   Height:      Weight:      SpO2: 97% 99% 100% 100%    Intake/Output Summary (Last 24 hours) at 09/15/12 0921 Last data filed at 09/15/12 0800  Gross per 24 hour  Intake 4144.96 ml  Output   4700 ml  Net -555.04 ml   Filed Weights - weight today pending    09/09/12 0517 09/09/12 2300 09/14/12 1515 09/15/2012  Weight: 262 lb 5.6 oz (119 kg) 255 lb 1.2 oz (115.7 kg) 246 lb 14.6 oz (112 kg) 111.1 kg   PHYSICAL EXAM General: Well developed, well nourished, male - SOB at rest. Head: Normocephalic, atraumatic.  Neck: Supple without bruits, JVD at 10 cm. Lungs:  Resp regular and unlabored, rales bilaterally anterior/posterior. Heart: Irreg, rapid, S1, S2, no S3, S4, 2-3/6 murmur. Abdomen: Soft, non-tender, non-distended, BS + x 4.  Extremities: No clubbing, cyanosis, no edema.  Neuro: Alert and oriented X 3. Very weak, generalized. Psych: Normal affect.  LABS: CBC: Basename 09/15/12 0500 09/14/12 0322  WBC 9.2 10.3  NEUTROABS -- --  HGB 6.5* 7.9*  HCT 20.0* 24.3*  MCV 84.7 83.2  PLT 216 265   INR: Basename 09/15/12 0500  INR 1.66*     Basic Metabolic Panel: Basename 09/15/12 0500 09/14/12 0322  NA 138 137  K 3.8 4.7  CL 106 108  CO2 23 20  GLUCOSE 127* 105*  BUN 39* 50*  CREATININE 1.00 1.08  CALCIUM 9.0 8.9  MG 1.9 --  PHOS 3.4 --   BNP: Pro B Natriuretic peptide (BNP)  Date/Time Value Range Status  09/10/2012  4:45 AM 5889.0* 0 - 125 pg/mL Final  09/09/2012  3:54 AM 4957.0* 0 - 125 pg/mL Final   Anemia Panel: Basename 09/15/12 0500  VITAMINB12 --  FOLATE --  FERRITIN --  TIBC --  IRON --  RETICCTPCT 0.8    TELE:  Atrial fib, RVR   Radiology/Studies: Dg Chest Port 1 View 09/15/2012  *RADIOLOGY REPORT*  Clinical Data: Respiratory failure  PORTABLE CHEST - 1 VIEW  Comparison: Yesterday  Findings: Stable cardiomegaly.    Stable patchy airspace disease throughout the right lung and left base.  No pneumothorax.  Stable left subclavian AICD device.  IMPRESSION: Stable bilateral airspace disease right greater than left.   Original Report Authenticated By: Jolaine Click, M.D.    Dg Chest Port 1 View 09/14/2012  *RADIOLOGY REPORT*  Clinical Data: Shortness of breath, pneumonia  PORTABLE CHEST - 1 VIEW  Comparison: 09/13/2012  Findings: Multifocal patchy right lung opacities, suspicious for pneumonia, grossly unchanged.  Superimposed mild interstitial edema is suspected.  Patchy left  basilar atelectasis.  No pneumothorax.  Cardiomegaly.  Left subclavian ICD.  IMPRESSION: Multifocal patchy right lung opacities, suspicious for pneumonia, grossly unchanged.  Superimposed mild interstitial edema is suspected.   Original Report Authenticated By: Charline Bills, M.D.    Current Medications:  . ceFEPime (MAXIPIME) IV  1 g Intravenous Q8H  . feeding supplement  237 mL Oral TID BM  . furosemide  40 mg Intravenous Q6H  . guaiFENesin  600 mg Oral BID  . isosorbide mononitrate  60 mg Oral Daily  . metoprolol tartrate  12.5 mg Oral BID  . pantoprazole  40 mg Oral Q1200  . phytonadione (VITAMIN K) IV  10 mg Intravenous  Once  . phytonadione  10 mg Oral Once  . pravastatin  40 mg Oral QPC supper  . sodium chloride  3 mL Intravenous Q12H  . vancomycin  2,000 mg Intravenous Once  . vancomycin  1,000 mg Intravenous Q12H  . Warfarin - Pharmacist Dosing Inpatient   Does not apply q1800      . diltiazem (CARDIZEM) infusion 15 mg/hr (09/15/12 0800)  . milrinone 0.125 mcg/kg/min (09/15/12 0839)    ASSESSMENT AND PLAN: Active Problems:  Atrial fibrillation with RVR - will increase upper limit of Cardizem to 20 mg/hr but think elevated HR secondary to underlying illness. Follow.   Chronic systolic CHF (congestive heart failure), NYHA class 3 - Rec'd IV Lasix 40 mg x 3 doses yesterday per CCM. I/O negative and wt decreased. BUN/Cr improved. Will add IV Lasix x 1 dose post-transfusion, still on milrinone. Further diuresis per CCM. Hopefully, will improve and be able to wean milrinone/start PO Lasix soon.  Otherwise, per CCM  IMPLANTATION OF DEFIBRILLATOR, HX OF  Iron deficiency anemia, unspecified  CAP (community acquired pneumonia)  Cardiomyopathy, nonischemic  Hypertension  Acute respiratory failure with hypoxia  Warfarin-induced coagulopathy  Mild epistaxis  CKD (chronic kidney disease) stage 2, GFR 60-89 ml/min  Acute renal failure  Thrombocytopenia  Signed, Theodore Demark , PA-C 9:21 AM 09/15/2012 Patient examined and agree. Clinically stable. Agree with transfusion followed by Lasix. Breathing more comfortably. Continue pulmonary critical care treatment. Continue milrinone.  Valera Castle, MD 09/15/2012 9:55 AM

## 2012-09-15 NOTE — Progress Notes (Signed)
CRITICAL VALUE ALERT  Critical value received:  HgB  Date of notification:  09/15/2012   Time of notification:  0558  Critical value read back:yes  Nurse who received alert:  Luther Hearing  MD notified (1st page):  Deterding  Time of first page:  0600  MD notified (2nd page):  Time of second page:  Responding MD:  Dr. Darrick Penna  Time MD responded:  [phone]

## 2012-09-15 NOTE — Progress Notes (Addendum)
Referring provider: Isidor Holts Reason for consultation: Acute respiratory distress Chief complaint: Short of breath  [name] [name] is a 56 y.o. male former smoker admitted on 09/06/2012 with dyspnea, cough with sputum, fever and chills from PNA.  Also found to have anemia.  Had persistent fever, and worsening respiratory status prompting transfer to ICU 10/26.  PCCM consulted 10/26.  Significant PMHx of systolic CHF (EF 35 to 40%), HTN, A fib, a/p AICD  Line/tube: Peripheral IV x2  Cultures: Blood 10/24>>ng Legionella Ag 10/24>>negative Pneumococcal Ag 10/24>>negative Blood 10/26>>ng Influenza panel 10/27>>negative A and B HIV 10/27>>NR  Antibiotics: Zithromax 10/23>>10/28 Rocephin 10/23>>10/26 Cefepime 10/26>> Vancomycin 10/26>>10/29>>>10/31>>>  Consultants: [facility] Elnoria Howard)  Best Practice: SUP - Protonix DVT - Chronic coumadin   Events: 10/24 Transfuse 2 units PRBC 10/25 Recurrent fever, hypoxia 10/26 Transfer to ICU  SUBJECTIVE: Now very SOB  OBJECTIVE:  Blood pressure 131/64, pulse 128, temperature 101 F (38.3 C), temperature source Oral, resp. rate 35, height 6\' 5"  (1.956 m), weight 112 kg (246 lb 14.6 oz), SpO2 96.00%.  Wt Readings from Last 3 Encounters:  09/14/12 112 kg (246 lb 14.6 oz)  08/03/12 118.842 kg (262 lb)  05/09/12 122.072 kg (269 lb 1.9 oz)   Body mass index is 29.28 kg/(m^2).  I/O last 3 completed shifts: In: 4920.9 [P.O.:1645; I.V.:1112.9; Blood:954; IV Piggyback:1209] Out: 6225 [Urine:6225]  General - Breathing 30 to 40 times per minute.  HEENT - moist mucous membranes Cardiac - irregular, no murmur Chest - labored, increase WOB, bilateral rales.  Abd - soft, non tender, + bowel sounds Ext - no edema Neuro - normal strength Skin - no rashes   ASSESSMENT/PLAN:  PULMONOLOGY ABG    Component Value Date/Time   PHART 7.468* 09/15/2012 0132   PCO2ART 30.9* 09/15/2012 0132   PO2ART 96.0 09/15/2012 0132   HCO3 22.4 09/15/2012 0132   TCO2 23 09/15/2012 0132   ACIDBASEDEF 1.0 09/15/2012 0132   O2SAT 98.0 09/15/2012 0132   A:  Acute respiratory failure. Secondary to severe CAP Probable aspiration on 10/31 Has Right > left airspace disease, now with increased WOB after what was likely an acute aspiration event during the period of epistaxis. P: Supplemental oxygen to keep SpO2 > 92% PRN xopenex See ID section BiPAP QHS if he will wear. Will need evaluation for sleep study as outpt as he may have obstructive sleep apnea in the setting of chronic heart disease.   CARDIOVASCULAR  Lab 09/10/12 0445 09/09/12 0354  PROBNP 5889.0* 4957.0*   Hx of systolic CHF, A fib, HTN, hyperlipidemia. Echo: 10/28: LVEF moderately depressed, hypokinesis of inferior wall, mild LVH Currently rate controlled, holding coumadin in setting of epistaxis and coagulopathy.  Started milrinone on 10/30, appears to be helping.  P: - Cont on milrinone continue at 0.125 - Spironolactone d/ced given hyperkalemia - Coumadin off, see heme section. - Cardizem drip, titrate with starting of PO. - Minimize beta blockers, metoprolol from 100 to 12.5 BID inorder to avoid rebound tachy.  RENAL  Lab 09/15/12 0500 09/14/12 0322 09/13/12 0337  NA 138 137 130*  CL 106 108 102  K 3.8 4.7 4.7  CO2 23 20 16*  BUN 39* 50* 61*  CREATININE 1.00 1.08 1.34    Intake/Output Summary (Last 24 hours) at 09/15/12 1210 Last data filed at 09/15/12 1200  Gross per 24 hour  Intake 4636.59 ml  Output   3350 ml  Net 1286.59 ml   Acute on Chronic renal failure baseline 1.2. Improved  urine output and scr over last few days. Seems to be tolerating diuresis.  P: Monitor BMP/recheck now. Monitor urine output. Will give additional lasix today to maintain dry. Potassium replacement  GI  Lab 09/10/12 0445  ALT 13  AST 19  GGT --  ALKPHOS 68  BILITOT 1.9*   A:no acute issues, but is risk for aspiration  P:  Swallow  evaluation.  HEME  Lab Results  Component Value Date   INR 1.66* 09/15/2012   INR 6.43* 09/14/2012   INR 5.24* 09/13/2012    Lab 09/15/12 0500 09/14/12 0322 09/13/12 0337  HGB 6.5* 7.9* 8.8*   A: Anemia of chronic disease,  W/ acute blood loss anemia due to epistaxis in setting of coagulopathy 10/31 Seen by GI>>no interventions planned Elevated INR in setting of coumadin: likely from antibiotics, epistaxis resolved.  Plan: Cont to monitor. Transfuse for Hgb <7 Hold coumadin. Transfuse one unit, if no further bleeding will start coumadin in AM. May continue transfusion despite fever, call if vital signs change (patient febrile due to aspiration PNA).  ID:  Lab 09/15/12 0500 09/14/12 1628 09/14/12 0322 09/13/12 0337 09/12/12 0432 09/11/12 0510  PROCALCITON -- 1.42 -- 3.71 6.10 6.98  WBC 9.2 -- 10.3 18.7* 12.5* --  LATICACIDVEN -- 2.1 -- -- -- --  A: community acquired pneumonia, now possible aspiration on 10/31 after episode of epistaxis Influenza panel and HIV negative P: Continue cefepime, adding back vanc. Trend PCT. Vancomycin and Zosyn and F/u on cultures.  Endocrine: CBG (last 3)  No results found for this basename: GLUCAP:3 in the last 72 hours No acute issues P: Monitor CBG.  Alyson Reedy, M.D. [facility]. Pager: [phone]. After hours pager: [phone].

## 2012-09-15 NOTE — Progress Notes (Signed)
Pt has increased confusion, lethargy, and slurred speech. Notified MD of changes, will continue to monitor. VSS.

## 2012-09-15 NOTE — Progress Notes (Signed)
eLink Physician-Brief Progress Note Patient Name: [name] [name] DOB: Apr 10, 1956 MRN: [id number]  Date of Service  09/15/2012   HPI/Events of Note  Hgb down to 6.5 from 7.9   eICU Interventions  Plan: Order for 1 unit pRBC Post-txf CBC   Intervention Category Intermediate Interventions: Bleeding - evaluation and treatment with blood products  DETERDING,ELIZABETH 09/15/2012, 6:07 AM

## 2012-09-15 NOTE — Progress Notes (Signed)
PT Cancellation Note  Patient Details Name: [name] [name] MRN: [id number] DOB: 16-Sep-1956   Cancelled Treatment:     PT session Cancelled due to low hgb, 6.5.    Mertie Clause , SPTA 09/15/2012, 7:40 AM   Verdell Face, PTA [phone] 09/15/2012

## 2012-09-15 NOTE — Evaluation (Signed)
Clinical/Bedside Swallow Evaluation Patient Details  Name: [name] [name] MRN: [id number] Date of Birth: 1956-01-17  Today's Date: 09/15/2012 Time: 0865-7846 SLP Time Calculation (min): 19 min  Past Medical History:  Past Medical History  Diagnosis Date  . Cardiomyopathy, nonischemic     EF 35-40% BY 2D ECHO, h/o clinical congestive heart failure; h/o cardiac arrest for which an ICD was implanted in 1998  . Hypertension   . AF (atrial fibrillation)     permanent, on coumadin  . Cardiac arrest 1998    ICD implanted at [facility] at that time  . Tobacco abuse, in remission     20 pack years or less; discontinued in 1998   Past Surgical History:  Past Surgical History  Procedure Date  . Cardiac defibrillator placement 1998  . 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 male former smoker admitted on 09/06/2012 with dyspnea, cough with sputum, fever and chills from PNA.  Also found to have anemia.  Had persistent fever, and worsening respiratory status prompting transfer to ICU 10/26.  Developed increased right greater than left airspace disease and increased WOB after perceived aspiration event due to epistaxis.    Assessment / Plan / Recommendation Clinical Impression  Patient presents with what appears to be a functional oropharyngeal swallow with appropriate oral propulsion of bolus, timing of swallow, hyo-laryngeal elevation, and excursion with trials of pureed solids and thin liquids. Combination of AMS, lethargy, missing dentition, and SOB however impacting ability to masticate soft solids, resulting in significant oral holding requiring eventual SLP cueing to expectorate bolus. Although no overt s/s of aspiration observed, lethargy and significant SOB do increase risk. Additionally, admission diagnosis of PNA cannot be  ignored when evaluating for aspiration. SLP will f/u closely for tolerance of pos at bedside, ability to advance, and potential need for objective study pending performance.     Aspiration Risk  Moderate    Diet Recommendation Dysphagia 1 (Puree);Thin liquid   Liquid Administration via: Cup;Straw Medication Administration: Whole meds with liquid Supervision: Patient able to self feed;Full supervision/cueing for compensatory strategies Compensations: Slow rate;Small sips/bites (take frequent breaks during po intake to control SOB) Postural Changes and/or Swallow Maneuvers: Seated upright 90 degrees    Other  Recommendations Oral Care Recommendations: Oral care BID   Follow Up Recommendations  Other (comment) (TBD)    Frequency and Duration min 3x week  2 weeks   Pertinent Vitals/Pain n/a    SLP Swallow Goals Patient will utilize recommended strategies during swallow to increase swallowing safety with: Minimal assistance Swallow Study Goal #2 - Progress: Not met Goal #3: Patient will consume clinician provided po trials for differential diagnosis of swallowing function with moderate cues for use of compensatory strategies.  Swallow Study Goal #3 - Progress: Not met   Swallow Study    General HPI: [name] [name] is a 56 y.o. male former smoker admitted on 09/06/2012 with dyspnea, cough with sputum, fever and chills from PNA.  Also found to have anemia.  Had persistent fever, and worsening respiratory status prompting transfer to ICU 10/26.  Developed increased right greater than left airspace disease and increased WOB after perceived aspiration event due to epistaxis.  Type of Study: Bedside swallow evaluation Previous Swallow Assessment: none noted Diet Prior to this Study: Regular;Thin liquids Temperature Spikes Noted:  Yes Respiratory Status: Supplemental O2 delivered via (comment) (3 L nasal cannula) History of Recent Intubation: No Behavior/Cognition:  Lethargic;Confused;Decreased sustained attention;Requires cueing Oral Cavity - Dentition: Edentulous (unclear if patient has dentures) Self-Feeding Abilities: Able to feed self Patient Positioning: Upright in bed Baseline Vocal Quality: Clear Volitional Cough: Weak Volitional Swallow: Able to elicit    Oral/Motor/Sensory Function Overall Oral Motor/Sensory Function: Appears within functional limits for tasks assessed (however SOB impacting oral function)   Ice Chips Ice chips: Not tested   Thin Liquid Thin Liquid: Within functional limits Presentation: Straw    Nectar Thick Nectar Thick Liquid: Not tested   Honey Thick Honey Thick Liquid: Not tested   Puree Puree: Within functional limits Presentation: Spoon   Solid   GO   Audrey Thull MA, CCC-SLP [phone]  Solid: Impaired Presentation: Self Fed Oral Phase Impairments: Impaired anterior to posterior transit;Poor awareness of bolus Oral Phase Functional Implications: Oral holding (eventually required cueing from SLP to expectorate)       Jawad Wiacek Meryl 09/15/2012,1:40 PM

## 2012-09-15 NOTE — Progress Notes (Signed)
OT Cancellation Note  Patient Details Name: [name] [name] MRN: [id number] DOB: 08/20/1956   Cancelled Treatment:    Reason Eval/Treat Not Completed: Medical issues which prohibited therapy.  Pt's HGB down to 6.5.  Pt HR also increased at rest from 120-150 at rest.  [name] 09/15/2012, 1:22 PM

## 2012-09-16 DIAGNOSIS — I509 Heart failure, unspecified: Secondary | ICD-10-CM

## 2012-09-16 LAB — URINE CULTURE
Colony Count: NO GROWTH
Culture: NO GROWTH

## 2012-09-16 LAB — BASIC METABOLIC PANEL
BUN: 34 mg/dL — ABNORMAL HIGH (ref 6–23)
CO2: 24 mEq/L (ref 19–32)
Glucose, Bld: 117 mg/dL — ABNORMAL HIGH (ref 70–99)
Potassium: 4.7 mEq/L (ref 3.5–5.1)
Sodium: 138 mEq/L (ref 135–145)

## 2012-09-16 LAB — PREPARE FRESH FROZEN PLASMA
Unit division: 0
Unit division: 0

## 2012-09-16 LAB — CULTURE, BLOOD (ROUTINE X 2)
Culture: NO GROWTH
Culture: NO GROWTH

## 2012-09-16 LAB — CBC
HCT: 20.6 % — ABNORMAL LOW (ref 39.0–52.0)
Hemoglobin: 6.5 g/dL — CL (ref 13.0–17.0)
MCHC: 31.6 g/dL (ref 30.0–36.0)
RBC: 2.46 MIL/uL — ABNORMAL LOW (ref 4.22–5.81)

## 2012-09-16 LAB — FIBRINOGEN: Fibrinogen: 748 mg/dL — ABNORMAL HIGH (ref 204–475)

## 2012-09-16 LAB — PROTIME-INR
INR: 1.64 — ABNORMAL HIGH (ref 0.00–1.49)
Prothrombin Time: 18.9 seconds — ABNORMAL HIGH (ref 11.6–15.2)

## 2012-09-16 LAB — PHOSPHORUS: Phosphorus: 3.2 mg/dL (ref 2.3–4.6)

## 2012-09-16 LAB — APTT: aPTT: 38 seconds — ABNORMAL HIGH (ref 24–37)

## 2012-09-16 MED ORDER — METOPROLOL TARTRATE 25 MG PO TABS
25.0000 mg | ORAL_TABLET | Freq: Two times a day (BID) | ORAL | Status: DC
  Administered 2012-09-16 – 2012-09-17 (×2): 25 mg via ORAL
  Filled 2012-09-16 (×3): qty 1

## 2012-09-16 MED ORDER — FERROUS SULFATE 325 (65 FE) MG PO TABS
325.0000 mg | ORAL_TABLET | Freq: Two times a day (BID) | ORAL | Status: DC
  Administered 2012-09-16 – 2012-09-29 (×26): 325 mg via ORAL
  Filled 2012-09-16 (×29): qty 1

## 2012-09-16 MED ORDER — FUROSEMIDE 10 MG/ML IJ SOLN
40.0000 mg | Freq: Four times a day (QID) | INTRAMUSCULAR | Status: AC
  Administered 2012-09-16 (×3): 40 mg via INTRAVENOUS
  Filled 2012-09-16 (×3): qty 4

## 2012-09-16 MED ORDER — LISINOPRIL 5 MG PO TABS
5.0000 mg | ORAL_TABLET | Freq: Every day | ORAL | Status: DC
  Administered 2012-09-16 – 2012-09-29 (×14): 5 mg via ORAL
  Filled 2012-09-16 (×14): qty 1

## 2012-09-16 MED ORDER — POTASSIUM CHLORIDE CRYS ER 20 MEQ PO TBCR
40.0000 meq | EXTENDED_RELEASE_TABLET | Freq: Once | ORAL | Status: AC
  Administered 2012-09-16: 40 meq via ORAL
  Filled 2012-09-16: qty 2

## 2012-09-16 MED ORDER — DILTIAZEM HCL 60 MG PO TABS
60.0000 mg | ORAL_TABLET | Freq: Four times a day (QID) | ORAL | Status: DC
  Administered 2012-09-16 – 2012-09-18 (×11): 60 mg via ORAL
  Filled 2012-09-16 (×12): qty 1

## 2012-09-16 NOTE — Progress Notes (Signed)
Patient Name: [name] [name] Date of Encounter: 09/16/2012  Active Problems:  Atrial fibrillation with RVR  Chronic systolic CHF (congestive heart failure), NYHA class 3  IMPLANTATION OF DEFIBRILLATOR, HX OF  Iron deficiency anemia, unspecified  CAP (community acquired pneumonia)  Cardiomyopathy, nonischemic  Hypertension  Acute respiratory failure with hypoxia  Warfarin-induced coagulopathy  Mild epistaxis  CKD (chronic kidney disease) stage 2, GFR 60-89 ml/min  Acute renal failure  Thrombocytopenia    SUBJECTIVE: Pt appears to be SOB but is able to speak in almost full sentences. He ate majority of breakfast this morning and states he is feeling better. Less coughing. HR stable in the 90s this AM although was elevated some yesterday. On oral diltiazem and drip is off. Not on any drips at this time. Getting 1 unit PRBC given some GI bleeding.   OBJECTIVE Filed Vitals:   09/16/12 0815 09/16/12 0830 09/16/12 0845 09/16/12 0900  BP: 122/73 116/67 126/53   Pulse: 86 85 90   Temp: 98.9 F (37.2 C) 99 F (37.2 C) 98.7 F (37.1 C) 98.7 F (37.1 C)  TempSrc:      Resp: 28 28 28    Height:      Weight:      SpO2:        Intake/Output Summary (Last 24 hours) at 09/16/12 0926 Last data filed at 09/16/12 0800  Gross per 24 hour  Intake 2663.26 ml  Output   1800 ml  Net 863.26 ml   Filed Weights - weight today pending    09/09/12 0517 09/09/12 2300 09/14/12 1515 09/15/2012  Weight: 262 lb 5.6 oz (119 kg) 255 lb 1.2 oz (115.7 kg) 246 lb 14.6 oz (112 kg) 111.1 kg   PHYSICAL EXAM General: Well developed, well nourished, male - SOB at rest. Head: Normocephalic, atraumatic.  Neck: Supple without bruits, JVD at 10 cm. Lungs:  Resp regular and unlabored, rales bilaterally anterior/posterior. Heart: Irreg, S1, S2, no S3, S4, 2-3/6 murmur. Abdomen: Soft, non-tender, non-distended, BS + x 4.  Extremities: No clubbing, cyanosis, no edema.  Neuro: Alert and oriented X 3. Very  weak, generalized. Psych: Normal affect.  LABS: CBC:  Basename 09/16/12 0525 09/15/12 1334  WBC 11.8* 13.4*  NEUTROABS -- --  HGB 6.5* 7.7*  HCT 20.6* 24.0*  MCV 83.7 84.8  PLT 216 239   INR:  Basename 09/16/12 0525  INR 1.64*   Basic Metabolic Panel:  Basename 09/16/12 0525 09/15/12 0500  NA 138 138  K 4.7 3.8  CL 107 106  CO2 24 23  GLUCOSE 117* 127*  BUN 34* 39*  CREATININE 1.02 1.00  CALCIUM 8.9 9.0  MG 1.8 1.9  PHOS 3.2 3.4   BNP: Pro B Natriuretic peptide (BNP)  Date/Time Value Range Status  09/10/2012  4:45 AM 5889.0* 0 - 125 pg/mL Final  09/09/2012  3:54 AM 4957.0* 0 - 125 pg/mL Final   Anemia Panel:  Basename 09/15/12 0500  VITAMINB12 411  FOLATE 6.4  FERRITIN 1315*  TIBC 172*  IRON 14*  RETICCTPCT 0.8    TELE:  Atrial fib, RVR   Radiology/Studies: Dg Chest Port 1 View 09/15/2012  *RADIOLOGY REPORT*  Clinical Data: Respiratory failure  PORTABLE CHEST - 1 VIEW  Comparison: Yesterday  Findings: Stable cardiomegaly.    Stable patchy airspace disease throughout the right lung and left base.  No pneumothorax.  Stable left subclavian AICD device.  IMPRESSION: Stable bilateral airspace disease right greater than left.   Original Report Authenticated By:  Jolaine Click, M.D.    Dg Chest Port 1 View 09/14/2012  *RADIOLOGY REPORT*  Clinical Data: Shortness of breath, pneumonia  PORTABLE CHEST - 1 VIEW  Comparison: 09/13/2012  Findings: Multifocal patchy right lung opacities, suspicious for pneumonia, grossly unchanged.  Superimposed mild interstitial edema is suspected.  Patchy left basilar atelectasis.  No pneumothorax.  Cardiomegaly.  Left subclavian ICD.  IMPRESSION: Multifocal patchy right lung opacities, suspicious for pneumonia, grossly unchanged.  Superimposed mild interstitial edema is suspected.   Original Report Authenticated By: Charline Bills, M.D.    Current Medications:  . ceFEPime (MAXIPIME) IV  1 g Intravenous Q8H  . feeding supplement  237 mL  Oral TID BM  . furosemide  40 mg Intravenous Q6H  . guaiFENesin  600 mg Oral BID  . isosorbide mononitrate  60 mg Oral Daily  . metoprolol tartrate  12.5 mg Oral BID  . pantoprazole  40 mg Oral Q1200  . phytonadione (VITAMIN K) IV  10 mg Intravenous Once  . phytonadione  10 mg Oral Once  . pravastatin  40 mg Oral QPC supper  . sodium chloride  3 mL Intravenous Q12H  . vancomycin  2,000 mg Intravenous Once  . vancomycin  1,000 mg Intravenous Q12H  . Warfarin - Pharmacist Dosing Inpatient   Does not apply q1800      . diltiazem (CARDIZEM) infusion 5 mg/hr (09/15/12 1845)  . DISCONTD: milrinone 0.125 mcg/kg/min (09/15/12 1500)    ASSESSMENT AND PLAN: Active Problems:  Atrial fibrillation with RVR - Likely secondary to pulmonary process and is now controlled on oral dosing diltiazem. 90 mg oral q 6 hours. Will follow. HR 70-90 today. No pauses. On metoprolol 12.5 BID oral. Will follow.   Chronic systolic CHF (congestive heart failure), NYHA class 3 - Rec'd IV Lasix 40 mg x 3 doses 10/31 per CCM. I/O negative and wt decreased. BUN/Cr improved. Getting IV Lasix x 1 dose post-transfusion. Further diuresis per CCM. May be able to switch to oral after transfusion for diuresis needs.   Otherwise, per CCM  IMPLANTATION OF DEFIBRILLATOR, HX OF  Iron deficiency anemia, unspecified  CAP (community acquired pneumonia)  Cardiomyopathy, nonischemic  Hypertension  Acute respiratory failure with hypoxia  Warfarin-induced coagulopathy  Mild epistaxis  CKD (chronic kidney disease) stage 2, GFR 60-89 ml/min  Acute renal failure  Thrombocytopenia   Genella Mech, MD 09/16/2012  Attending Note:   The patient was seen and examined.  Agree with assessment and plan as noted above.  Pt is generally improving from a cardiac standpoint.  His A-fib rate is improving.  Chronic Atrial Fib:  The RVR is new ( with the pneumonia)  His rate should improve as his pneumonia improves.  Chronic systolic  CHF: the echo was difficult but his EF is likely 30-40%.  He will likely need scheduled Lasix. He is on metoprolol ( lower dose at this point) , Imdur.  He was on Lisinopril  10 at home.  Will start Lisinopril 5 today.  His HR is fairly well controlled on the Metoprolol and PO dilt.  Will transition him to Beta blocker and off the CCB.   Vesta Mixer, Montez Hageman., MD, [facility] 09/16/2012, 10:25 AM   9:26 AM PGY-2

## 2012-09-16 NOTE — Progress Notes (Signed)
Referring provider: Isidor Holts Reason for consultation: Acute respiratory distress Chief complaint: Short of breath  [name] [name] is a 56 y.o. male former smoker admitted on 09/06/2012 with dyspnea, cough with sputum, fever and chills from PNA.  Also found to have anemia.  Had persistent fever, and worsening respiratory status prompting transfer to ICU 10/26.  PCCM consulted 10/26.  Significant PMHx of systolic CHF (EF 35 to 40%), HTN, A fib, a/p AICD  Line/tube: Peripheral IV x2  Cultures: Blood 10/24>>ng Legionella Ag 10/24>>negative Pneumococcal Ag 10/24>>negative Blood 10/26>>ng Influenza panel 10/27>>negative A and B HIV 10/27>>NR  Antibiotics: Zithromax 10/23>>10/28 Rocephin 10/23>>10/26 Cefepime 10/26>> Vancomycin 10/26>>10/29>>>10/31>>>  Consultants: [facility] Elnoria Howard)  Best Practice: SUP - Protonix DVT - Chronic coumadin   Events: 10/24 Transfuse 2 units PRBC 10/25 Recurrent fever, hypoxia 10/26 Transfer to ICU  SUBJECTIVE: Now very SOB  OBJECTIVE:  Blood pressure 126/53, pulse 90, temperature 98.7 F (37.1 C), temperature source Oral, resp. rate 28, height 6\' 5"  (1.956 m), weight 111.1 kg (244 lb 14.9 oz), SpO2 100.00%.  Wt Readings from Last 3 Encounters:  09/15/12 111.1 kg (244 lb 14.9 oz)  08/03/12 118.842 kg (262 lb)  05/09/12 122.072 kg (269 lb 1.9 oz)   Body mass index is 29.04 kg/(m^2).  I/O last 3 completed shifts: In: 6799.2 [P.O.:2020; I.V.:1117.2; Blood:2054; IV Piggyback:1608] Out: 4200 [Urine:4200]  General - Breathing much improved.  HEENT - moist mucous membranes Cardiac - irregular, no murmur Chest - labored, increase WOB, bilateral rales.  Abd - soft, non tender, + bowel sounds Ext - no edema Neuro - normal strength Skin - no rashes  ASSESSMENT/PLAN:  PULMONOLOGY ABG    Component Value Date/Time   PHART 7.468* 09/15/2012 0132   PCO2ART 30.9* 09/15/2012 0132   PO2ART 96.0 09/15/2012 0132   HCO3 22.4  09/15/2012 0132   TCO2 23 09/15/2012 0132   ACIDBASEDEF 1.0 09/15/2012 0132   O2SAT 98.0 09/15/2012 0132   A:  Acute respiratory failure. Secondary to severe CAP Probable aspiration on 10/31 Has Right > left airspace disease, now with increased WOB after what was likely an acute aspiration event during the period of epistaxis. P: - Supplemental oxygen to keep SpO2 > 92%. - PRN xopenex. - See ID section. - BiPAP QHS if he will wear. - Will need evaluation for sleep study as outpt as he may have obstructive sleep apnea in the setting of chronic heart disease.   CARDIOVASCULAR  Lab 09/10/12 0445  PROBNP 5889.0*   Hx of systolic CHF, A fib, HTN, hyperlipidemia. Echo: 10/28: LVEF moderately depressed, hypokinesis of inferior wall, mild LVH Currently rate controlled, holding coumadin in setting of epistaxis and coagulopathy.  Started milrinone on 10/30, appears to be helping.  P: - Milrinone per cards. - Spironolactone d/ced given hyperkalemia - Coumadin off, see heme section. - Cardizem drip off . - Minimize beta blockers, metoprolol from 100 to 12.5 BID inorder to avoid rebound tachy.  RENAL  Lab 09/16/12 0525 09/15/12 0500 09/14/12 0322  NA 138 138 137  CL 107 106 108  K 4.7 3.8 4.7  CO2 24 23 20   BUN 34* 39* 50*  CREATININE 1.02 1.00 1.08   Intake/Output Summary (Last 24 hours) at 09/16/12 1004 Last data filed at 09/16/12 0800  Gross per 24 hour  Intake 2386.46 ml  Output   1800 ml  Net 586.46 ml   Acute on Chronic renal failure baseline 1.2. Improved urine output and scr over last few days. Seems  to be tolerating diuresis.  P: - Monitor BMP/recheck now. - Monitor urine output. - Will give additional lasix today to maintain dry. - Potassium replacement  GI  Lab 09/10/12 0445  ALT 13  AST 19  GGT --  ALKPHOS 68  BILITOT 1.9*   A:no acute issues, but is risk for aspiration  P:  Swallow evaluation passed, continue diet.  HEME  Lab Results  Component  Value Date   INR 1.64* 09/16/2012   INR 1.66* 09/15/2012   INR 6.43* 09/14/2012    Lab 09/16/12 0525 09/15/12 1334 09/15/12 0500  HGB 6.5* 7.7* 6.5*   A: Anemia of chronic disease,  W/ acute blood loss anemia due to epistaxis in setting of coagulopathy 10/31 Seen by GI>>no interventions planned Elevated INR in setting of coumadin: likely from antibiotics, epistaxis resolved.  Now with severe iron deficiency anemia and ? Of active bleeding. Plan: Cont to monitor. Transfuse for Hgb <7 Hold coumadin. Transfuse another unit of pRBC and supplement Iron aggressively.  ID:  Lab 09/16/12 0525 09/15/12 2351 09/15/12 1334 09/15/12 0500 09/14/12 1628 09/14/12 0322 09/13/12 0337 09/12/12 0432  PROCALCITON -- 0.84 -- -- 1.42 -- 3.71 6.10  WBC 11.8* -- 13.4* 9.2 -- 10.3 -- --  LATICACIDVEN -- -- -- -- 2.1 -- -- --  A: community acquired pneumonia, now possible aspiration on 10/31 after episode of epistaxis Influenza panel and HIV negative P: Continue cefepime, adding back vanc. Trend PCT. Vancomycin and Cefepime and F/u on cultures, continue for a total of 10 days.  Endocrine: CBG (last 3)  No results found for this basename: GLUCAP:3 in the last 72 hours No acute issues P: Monitor CBG. ISS.  Will transfer to SDU, TRH to pick up on 11/3, PCCM will sign off, please call back if needed.  Alyson Reedy, M.D. [facility] Pulmonary/Critical Care Medicine. Pager: [phone]. After hours pager: [phone].

## 2012-09-16 NOTE — Progress Notes (Signed)
09/14/12 1300  Clinical Encounter Type  Visited With Patient not available  Visit Type Follow-up   I followed up to see patient. He was sleeping and the family was not present. Note: patient has been moved from [facility]ICU to 2914. Veryl Speak

## 2012-09-16 NOTE — Progress Notes (Signed)
Speech Language Pathology Dysphagia Treatment Patient Details Name: [name] [name] MRN: [id number] DOB: [birth year] Today's Date: 09/16/2012 Time: 1230-1300 SLP Time Calculation (min): 30 min  Assessment / Plan / Recommendation Clinical Impression  Follow treatment for diet tolerance of dysphagia 1 (puree) and thin liquids following initial BSE completed on 09/15/12.  Patient observed at noon meal.  RR increased when repositioned with increased SOB prior to meal.  Patient tolerated puree consistency with no change in vital signs.  Increased SOB noted with trials of mechanical soft  consistency.  Patient reports puree consistency "easier to swallow " than mechanical soft . Recommend to continue current diet consistency due to continued lethargy and SOB with decreased reserve.  ST to follow on 09/17/12 for possible diet upgrade.     Diet Recommendation  Continue with Current Diet: Dysphagia 1 (puree);Thin liquid    SLP Plan Continue with current plan of care      Swallowing Goals  SLP Swallowing Goals Swallow Study Goal #2 - Progress: Progressing toward goal Swallow Study Goal #3 - Progress: Progressing toward goal  General Temperature Spikes Noted: No Respiratory Status: Supplemental O2 delivered via (comment) Behavior/Cognition: Alert;Cooperative;Pleasant mood;Confused Oral Cavity - Dentition: Edentulous Patient Positioning: Upright in bed  Oral Cavity - Oral Hygiene Does patient have any of the following "at risk" factors?: Oxygen therapy - cannula, mask, simple oxygen devices Patient is HIGH RISK - Oral Care Protocol followed (see row info): Yes Patient is AT RISK - Oral Care Protocol followed (see row info): Yes   Dysphagia Treatment Treatment focused on: Skilled observation of diet tolerance;Upgraded PO texture trials;Facilitation of oral phase;Facilitation of oral preparatory phase Treatment Methods/Modalities: Skilled observation;Differential diagnosis Patient observed  directly with PO's: Yes Type of PO's observed: Dysphagia 2 (chopped);Dysphagia 1 (puree);Thin liquids Feeding: Needs assist;Needs set up Liquids provided via: Cup Type of cueing: Verbal Amount of cueing: Moderate   GO    Moreen Fowler MS, CCC-SLP [phone] [facility] 09/16/2012, 2:31 PM

## 2012-09-16 NOTE — Progress Notes (Signed)
CRITICAL VALUE ALERT  Critical value received:  Hgb 6.5  Date of notification:  09/16/12  Time of notification:  0618  Critical value read back:yes  Nurse who received alert: Madalyn Rob   MD notified (1st page):  Dr. Frederico Hamman [address] [facility] at [facility]   Time of first page:  0618  MD notified (2nd page):  Time of second page:  Responding MD:  Dr. Frederico Hamman  Time MD responded:  [phone]

## 2012-09-16 NOTE — Progress Notes (Signed)
eLink Physician-Brief Progress Note Patient Name: [name] [name] DOB: Jun 14, 1956 MRN: [id number]  Date of Service  09/16/2012   HPI/Events of Note   Anemia   eICU Interventions  Transfuse 1 unit of PRBC      [name] 09/16/2012, 6:26 AM

## 2012-09-16 NOTE — Progress Notes (Signed)
ANTICOAGULATION/Antibiotic CONSULT NOTE - Follow Up Consult  Pharmacy Consult for Warfarin, Vancomycin, Cefepime Indication: atrial fibrillation, Pneumonia  No Known Allergies  Patient Measurements: Height: 6\' 5"  (195.6 cm) Weight: 244 lb 14.9 oz (111.1 kg) IBW/kg (Calculated) : 89.1   Vital Signs: Temp: 98.7 F (37.1 C) (11/02 0900) Temp src: Oral (11/02 0730) BP: 126/53 mmHg (11/02 0845) Pulse Rate: 90  (11/02 0845)  Labs:  Basename 09/16/12 0645 09/16/12 0525 09/15/12 1334 09/15/12 0500 09/14/12 0322  HGB -- 6.5* 7.7* -- --  HCT -- 20.6* 24.0* 20.0* --  PLT -- 216 239 216 --  APTT 38* -- -- -- --  LABPROT -- 18.9* -- 19.1* 52.0*  INR -- 1.64* -- 1.66* 6.43*  HEPARINUNFRC -- -- -- -- --  CREATININE -- 1.02 -- 1.00 1.08  CKTOTAL -- -- -- -- --  CKMB -- -- -- -- --  TROPONINI -- -- -- -- --    Estimated Creatinine Clearance: 114.6 ml/min (by C-G formula based on Cr of 1.02).   Medications:  Prescriptions prior to admission  Medication Sig Dispense Refill  . Cholecalciferol (VITAMIN D3) 2000 UNITS capsule Take 2,000 Units by mouth daily.        Marland Kitchen diltiazem (CARDIZEM CD) 180 MG 24 hr capsule Take 180 mg by mouth daily.      . finasteride (PROSCAR) 5 MG tablet Take 5 mg by mouth daily.       Marland Kitchen lisinopril (PRINIVIL,ZESTRIL) 10 MG tablet Take 1 tablet (10 mg total) by mouth daily.  90 tablet  3  . metoprolol (LOPRESSOR) 100 MG tablet Take 1 tablet (100 mg total) by mouth 2 (two) times daily.  180 tablet  3  . polyethylene glycol powder (GLYCOLAX/MIRALAX) powder Take 17 g by mouth daily as needed. For constipation - mix with 8 oz liquid and drink      . potassium chloride (MICRO-K) 10 MEQ CR capsule Take 1 capsule (10 mEq total) by mouth daily.  90 capsule  3  . pravastatin (PRAVACHOL) 40 MG tablet Take 40 mg by mouth at bedtime.       Marland Kitchen warfarin (COUMADIN) 2 MG tablet Take 2 mg by mouth See admin instructions. Take with 5 mg tablet on Saturday, Sunday, Monday, Tuesday and  Thursday      . warfarin (COUMADIN) 3 MG tablet Take 3 mg by mouth See admin instructions. Take with 5 mg tablet on Wednesday and Friday      . warfarin (COUMADIN) 5 MG tablet Take 5 mg by mouth See admin instructions. On Wednesday and Friday, take with 3 mg tablet for an 8 mg dose.  On all other days take with 2 mg tablet for a 7 mg dose        Admit Complaint: 56 y.o.  male  admitted 09/06/2012 with PNA (CAP) in setting of known CHF and Afib.  Pharmacy consulted to dose warfarin, and vancomycin.  Overnight Events: 09/16/2012 stabilizing after 1 unit PRBCs  Warfarin PTA:  8 mg wed, fri, 7 mg other days of the week  AC: afib, . Coumadin now on hold 2/2 anemia and possible GI blood loss (reports of blood tinged sputum) with last dose taken 10/22. Several doses of Vit. K given, INR today 09/15/2012 is 1.6 - with hgb down to 6.5 will transfuse and hold warfarin today  ID: PNA, WBC trend down and BP stabilized, Afeb, per CCM to complete 10 d course cefepime (stop 11/5, and stop Vanc in the same time) 10/24  Blood-ngtd 10/24 MRSA PCR-neg 10/24 Strep, Legionella urinary antigen-neg 10/26 Blood-NGTD 10/31 bld -ng 10/31 urine-ng  10/24 Ceftriaxone>> 10/26 10/24 Azithromycin>>10/28 10/24 Vancomycin>>10/28, 10/31 >> (11/5) 10/24 Cefepime>>(11/5)   Cards: Known CHF/Afib: dilt, Lopressor, Lasix, Imdur, statin;  HR 70-90, BP stable Unable to assess EF on echo -poor window, LV appears moderately depressed,   Endo: BG wnl, TSH 2.9  YN:WGNF nml; Cardiac diet; PO PPI  Renal:SCr 1.0, decreasing, as high as 2.17, BUN decreasing too; all lytes nml;  Pulm:2L [place]  Heme/Onc:Hgb 7.9>>6.5., platelets stable no active bleeding noted  PTA Medication Issues: Home medications not ordered:  lisinopril, proscar  Best Practices:SCDs, PPI  Plan: - Hold warfarin today again  - Daily PT/INR  - Cont Cefepime/Vanc stop after 11/5 doses   - Will draw vancomycin trough with AML 11/3 - Consider IV iron for  supplement  Thank you for allowing pharmacy to be a part of this patients care team.  Lovenia Kim Pharm.D., BCPS Clinical Pharmacist 09/16/2012 10:12 AM Pager: [phone] Phone: [phone]

## 2012-09-17 LAB — CBC
MCHC: 32.4 g/dL (ref 30.0–36.0)
Platelets: 228 10*3/uL (ref 150–400)
Platelets: 267 10*3/uL (ref 150–400)
RBC: 2.9 MIL/uL — ABNORMAL LOW (ref 4.22–5.81)
RDW: 16.9 % — ABNORMAL HIGH (ref 11.5–15.5)
WBC: 12.2 10*3/uL — ABNORMAL HIGH (ref 4.0–10.5)
WBC: 13.9 10*3/uL — ABNORMAL HIGH (ref 4.0–10.5)

## 2012-09-17 LAB — BASIC METABOLIC PANEL
BUN: 36 mg/dL — ABNORMAL HIGH (ref 6–23)
Chloride: 104 mEq/L (ref 96–112)
GFR calc Af Amer: 83 mL/min — ABNORMAL LOW (ref >90)
GFR calc non Af Amer: 72 mL/min — ABNORMAL LOW (ref >90)
Potassium: 5 mEq/L (ref 3.5–5.1)
Sodium: 135 mEq/L (ref 135–145)

## 2012-09-17 LAB — VANCOMYCIN, TROUGH: Vancomycin Tr: 17.3 ug/mL (ref 10.0–20.0)

## 2012-09-17 LAB — TYPE AND SCREEN: Antibody Screen: NEGATIVE

## 2012-09-17 LAB — PROTIME-INR
INR: 1.68 — ABNORMAL HIGH (ref 0.00–1.49)
Prothrombin Time: 19.2 seconds — ABNORMAL HIGH (ref 11.6–15.2)

## 2012-09-17 LAB — MAGNESIUM: Magnesium: 1.9 mg/dL (ref 1.5–2.5)

## 2012-09-17 MED ORDER — METOPROLOL TARTRATE 25 MG PO TABS
37.5000 mg | ORAL_TABLET | Freq: Two times a day (BID) | ORAL | Status: DC
  Administered 2012-09-17 – 2012-09-21 (×9): 37.5 mg via ORAL
  Filled 2012-09-17 (×11): qty 1

## 2012-09-17 MED ORDER — IBUPROFEN 200 MG PO TABS
200.0000 mg | ORAL_TABLET | Freq: Four times a day (QID) | ORAL | Status: DC | PRN
  Administered 2012-09-18 – 2012-09-21 (×2): 200 mg via ORAL
  Filled 2012-09-17 (×2): qty 1

## 2012-09-17 MED ORDER — OXYCODONE HCL 5 MG PO TABS: 5.0000 mg | ORAL_TABLET | ORAL | Status: DC | PRN

## 2012-09-17 MED ORDER — ACETAMINOPHEN 10 MG/ML IV SOLN
1000.0000 mg | Freq: Once | INTRAVENOUS | Status: AC
  Administered 2012-09-17: 1000 mg via INTRAVENOUS
  Filled 2012-09-17: qty 100

## 2012-09-17 MED ORDER — FUROSEMIDE 10 MG/ML IJ SOLN
40.0000 mg | Freq: Once | INTRAMUSCULAR | Status: AC
  Administered 2012-09-17: 40 mg via INTRAVENOUS
  Filled 2012-09-17: qty 4

## 2012-09-17 NOTE — Progress Notes (Signed)
[facility] Progress Note [place] TEAM 1 - Stepdown/ICU TEAM   KHY RYBA WUJ:[id number] DOB: 10/14/1956 DOA: 09/06/2012 PCP: [name], MD  Brief narrative: 56 year old patient with long-standing chronic severe systolic dysfunction with an EF of around 35%. He presented on 09/06/2012 with complaints of dyspnea, cough with productive sputum, fevers and chills felt to be related to pneumonia. He was also found to be anemic. Previous hemoglobin in 2008 was 13 and was down to just above 8. He had persistent fevers and worsening respiratory status after admission so was transferred to the ICU on 09/09/2012. PCCM assumed care of the patient because of respiratory difficulties. Since admission patient developed issues with atrial fibrillation and RVR that required cardiology consultation and the patient rate had been stabilized on diltiazem drip and he had subsequently been converted to oral Cardizem. He also continued to have respiratory problems that were associated with superimposed heart failure presumed secondary to stressors of chronic anemia and pneumonia. Because of this cardiology has started the patient on a milrinone infusion.   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 Tx as per Cardiolgy  Atrial fibrillation Mild tachycardia at present - follow trend  - Cardiology following  Hypertension reasonably  well controlled at this time  Hyperlipidemia  Acute on chronic renal failure Baseline creatinine 1.2 - crt stable at this time  Anemia of chronic disease with acute blood loss anemia due to epistaxis Due to epistaxis in setting of coagulopathy - goal is to keep hemoglobin greater than 7 - received 2U PRBC yesterday, for a total of 4U thus far - cont to follow trend  Coagulopathy - warfarin-induced Likely due to interaction of antibiotic with Coumadin  Suspected sleep apnea Will need outpatient sleep study  Code Status: Full Disposition Plan: Step down unit  Consultants: [facility]  GI Elnoria Howard)  Procedures: Echo: 10/28: LVEF moderately depressed, hypokinesis of inferior wall, mild LVH  Antibiotics: Zithromax 10/23>>10/28  Rocephin 10/23>>10/26  Cefepime 10/26>>  Vancomycin 10/26>>10/29 + 10/31>>>  DVT prophylaxis: SCDs until coumadin resumed  HPI/Subjective: Pt appears mildy tachypneic today.  He denies severe sob.  He denies cp, f/c, n/v.  He does admit to abdom soreness when he coughs.     Objective: Blood pressure 115/66, pulse 105, temperature 99.8 F (37.7 C), temperature source Oral, resp. rate 21, height 6\' 5"  (1.956 m), weight 111.1 kg (244 lb 14.9 oz), SpO2 99.00%.  Intake/Output Summary (Last 24 hours) at 09/17/12 1036 Last data filed at 09/17/12 0537  Gross per 24 hour  Intake   1618 ml  Output   3050 ml  Net  -1432 ml     Exam: General: mild resp distress at rest - able to complete full sentences w/o pausing Lungs: faint crackles note diffusely, worse in B bases - no wheeze Cardiovascular: mild tachycardia at ~105, without murmur gallop or rub appreciable  Abdomen: Nontender, nondistended, soft, bowel sounds positive,  no rebound, no ascites, no appreciable mass Extremities: No significant cyanosis, clubbing, or edema bilateral lower extremities  Data Reviewed: Basic Metabolic Panel:  Lab 09/17/12 1610 09/16/12 0525 09/15/12 0500 09/14/12  0322 09/13/12 0337  NA 135 138 138 137 130*  K 5.0 4.7 3.8 4.7 4.7  CL 104 107 106 108 102  CO2 24 24 23 20  16*  GLUCOSE 122* 117* 127* 105* 204*  BUN 36* 34* 39* 50* 61*  CREATININE 1.13 1.02 1.00 1.08 1.34  CALCIUM 9.1 8.9 9.0 8.9 9.0  MG 1.9 1.8 1.9 -- --  PHOS 3.7 3.2 3.4 -- --   CBC:  Lab 09/17/12 0550 09/16/12 0525 09/15/12 1334 09/15/12 0500 09/14/12 0322  WBC 12.2* 11.8* 13.4* 9.2 10.3  NEUTROABS -- -- -- -- --  HGB 7.3* 6.5* 7.7* 6.5* 7.9*  HCT 22.5* 20.6* 24.0* 20.0* 24.3*  MCV 84.3 83.7 84.8 84.7 83.2  PLT 228 216 239 216 265   BNP (last 3 results)  Basename 09/10/12 0445 09/09/12 0354 09/08/12 0306  PROBNP 5889.0* 4957.0* 6800.0*    Recent Results (from the past 240 hour(s))  CULTURE, BLOOD (ROUTINE X 2)     Status: Normal   Collection Time   09/07/12  4:35 PM      Component Value Range Status Comment   Specimen Description BLOOD LEFT ARM   Final    Special Requests BOTTLES DRAWN AEROBIC AND ANAEROBIC 10CC   Final    Culture  Setup Time 09/07/2012 22:26   Final    Culture NO GROWTH 5 DAYS   Final    Report Status 09/13/2012 FINAL   Final   CULTURE, BLOOD (ROUTINE X 2)     Status: Normal   Collection Time   09/07/12  4:45 PM      Component Value Range Status Comment   Specimen Description BLOOD RIGHT HAND   Final    Special Requests BOTTLES DRAWN AEROBIC AND ANAEROBIC 10CC   Final    Culture  Setup Time 09/07/2012 22:26   Final    Culture NO GROWTH 5 DAYS   Final    Report Status 09/13/2012 FINAL   Final   CULTURE, BLOOD (ROUTINE X 2)     Status: Normal   Collection Time   09/09/12  9:17 PM      Component Value Range Status Comment   Specimen Description BLOOD LEFT HAND   Final    Special Requests BOTTLES DRAWN AEROBIC ONLY 5CC   Final    Culture  Setup Time 09/10/2012 01:59   Final    Culture NO GROWTH 5 DAYS   Final    Report Status 09/16/2012 FINAL   Final   CULTURE, BLOOD (ROUTINE X 2)     Status: Normal   Collection Time   09/09/12  9:26 PM       Component Value Range Status Comment   Specimen Description BLOOD RIGHT HAND   Final    Special Requests BOTTLES DRAWN AEROBIC ONLY 1CC   Final    Culture  Setup Time 09/10/2012 01:59   Final    Culture NO GROWTH 5 DAYS   Final    Report Status 09/16/2012 FINAL   Final   MRSA PCR SCREENING     Status: Normal   Collection Time   09/09/12 10:34 PM      Component Value Range Status Comment   MRSA by PCR NEGATIVE  NEGATIVE Final   CULTURE, BLOOD (ROUTINE X 2)  Status: Normal (Preliminary result)   Collection Time   09/14/12  4:00 PM      Component Value Range Status Comment   Specimen Description BLOOD RIGHT HAND   Final    Special Requests BOTTLES DRAWN AEROBIC AND ANAEROBIC 10CC   Final    Culture  Setup Time 09/14/2012 22:39   Final    Culture     Final    Value:        BLOOD CULTURE RECEIVED NO GROWTH TO DATE CULTURE WILL BE HELD FOR 5 DAYS BEFORE ISSUING A FINAL NEGATIVE REPORT   Report Status PENDING   Incomplete   CULTURE, BLOOD (ROUTINE X 2)     Status: Normal (Preliminary result)   Collection Time   09/14/12  4:06 PM      Component Value Range Status Comment   Specimen Description BLOOD LEFT ARM   Final    Special Requests BOTTLES DRAWN AEROBIC AND ANAEROBIC 10CC   Final    Culture  Setup Time 09/14/2012 22:39   Final    Culture     Final    Value:        BLOOD CULTURE RECEIVED NO GROWTH TO DATE CULTURE WILL BE HELD FOR 5 DAYS BEFORE ISSUING A FINAL NEGATIVE REPORT   Report Status PENDING   Incomplete   URINE CULTURE     Status: Normal   Collection Time   09/14/12  9:31 PM      Component Value Range Status Comment   Specimen Description URINE, CLEAN CATCH   Final    Special Requests NONE   Final    Culture  Setup Time 09/14/2012 22:35   Final    [place] Count NO GROWTH   Final    Culture NO GROWTH   Final    Report Status 09/16/2012 FINAL   Final      Studies:  Recent x-ray studies have been reviewed in detail by the Attending Physician  Scheduled  Meds:  Reviewed in detail by the Attending Physician   Lonia Blood, MD [facility] Office  [phone] Pager [phone]  On-Call/Text Page:      [email or web address]      password [email or web address]  If 7PM-7AM, please contact night-coverage [email or web address] Password [email or web address] 09/17/2012, 10:36 AM   LOS: 11 days

## 2012-09-17 NOTE — Progress Notes (Signed)
Paged Ward Givens, NP regarding pt HR elevated 120-130s, as well as pt continuing to have a fever despite tylenol 650 mg 1 hr ago.  NP to increase metoprolol dosage to 37.5mg  BID.  Will continue to monitor HR and temp.  Salomon Mast, RN

## 2012-09-17 NOTE — Progress Notes (Signed)
Patient Name: [name] [name] Date of Encounter: 09/17/2012  Active Problems:  Atrial fibrillation with RVR  Chronic systolic CHF (congestive heart failure), NYHA class 3  IMPLANTATION OF DEFIBRILLATOR, HX OF  Iron deficiency anemia, unspecified  CAP (community acquired pneumonia)  Cardiomyopathy, nonischemic  Hypertension  Acute respiratory failure with hypoxia  Warfarin-induced coagulopathy  Mild epistaxis  CKD (chronic kidney disease) stage 2, GFR 60-89 ml/min  Acute renal failure  Thrombocytopenia    SUBJECTIVE: Pt appears to be SOB but is able to speak in almost full sentences. He ate majority of breakfast this morning and states he is feeling better. Less coughing. HR stable in the 90s this AM although was elevated some yesterday. On oral diltiazem and drip is off. Not on any drips at this time. Getting 1 unit PRBC given some GI bleeding.   OBJECTIVE Filed Vitals:   09/16/12 2300 09/16/12 2331 09/17/12 0337 09/17/12 0800  BP: 131/64  131/58 115/66  Pulse:   98 105  Temp:  99.5 F (37.5 C) 99.8 F (37.7 C) 99.8 F (37.7 C)  TempSrc:  Oral Oral Oral  Resp:  25 32 21  Height:      Weight:      SpO2:  98%  99%    Intake/Output Summary (Last 24 hours) at 09/17/12 0829 Last data filed at 09/17/12 0537  Gross per 24 hour  Intake   1618 ml  Output   3050 ml  Net  -1432 ml   Filed Weights - weight today pending    09/09/12 0517 09/09/12 2300 09/14/12 1515 09/15/2012  Weight: 262 lb 5.6 oz (119 kg) 255 lb 1.2 oz (115.7 kg) 246 lb 14.6 oz (112 kg) 111.1 kg   PHYSICAL EXAM General: Well developed, well nourished, male - SOB at rest. Head: Normocephalic, atraumatic.  Neck: Supple without bruits, JVD at 10 cm. Lungs:  Resp regular and unlabored, few wheezes bilaterally Heart: Irreg, S1, S2, no S3, S4, 2-3/6 murmur. Abdomen: Soft, non-tender, non-distended, BS + x 4.  Extremities: No clubbing, cyanosis, no edema.  Neuro: Alert and oriented X 3. Very weak,  generalized. Psych: Normal affect.  LABS: CBC:  Basename 09/17/12 0550 09/16/12 0525  WBC 12.2* 11.8*  NEUTROABS -- --  HGB 7.3* 6.5*  HCT 22.5* 20.6*  MCV 84.3 83.7  PLT 228 216   INR:  Basename 09/17/12 0550  INR 1.68*   Basic Metabolic Panel:  Basename 09/17/12 0550 09/16/12 0525  NA 135 138  K 5.0 4.7  CL 104 107  CO2 24 24  GLUCOSE 122* 117*  BUN 36* 34*  CREATININE 1.13 1.02  CALCIUM 9.1 8.9  MG 1.9 1.8  PHOS 3.7 3.2   BNP: Pro B Natriuretic peptide (BNP)  Date/Time Value Range Status  09/10/2012  4:45 AM 5889.0* 0 - 125 pg/mL Final  09/09/2012  3:54 AM 4957.0* 0 - 125 pg/mL Final   Anemia Panel:  Basename 09/15/12 0500  VITAMINB12 411  FOLATE 6.4  FERRITIN 1315*  TIBC 172*  IRON 14*  RETICCTPCT 0.8    TELE:  Atrial fib, RVR   Radiology/Studies: Dg Chest Port 1 View 09/15/2012  *RADIOLOGY REPORT*  Clinical Data: Respiratory failure  PORTABLE CHEST - 1 VIEW  Comparison: Yesterday  Findings: Stable cardiomegaly.    Stable patchy airspace disease throughout the right lung and left base.  No pneumothorax.  Stable left subclavian AICD device.  IMPRESSION: Stable bilateral airspace disease right greater than left.   Original Report Authenticated By:  Jolaine Click, M.D.    Dg Chest Port 1 View 09/14/2012  *RADIOLOGY REPORT*  Clinical Data: Shortness of breath, pneumonia  PORTABLE CHEST - 1 VIEW  Comparison: 09/13/2012  Findings: Multifocal patchy right lung opacities, suspicious for pneumonia, grossly unchanged.  Superimposed mild interstitial edema is suspected.  Patchy left basilar atelectasis.  No pneumothorax.  Cardiomegaly.  Left subclavian ICD.  IMPRESSION: Multifocal patchy right lung opacities, suspicious for pneumonia, grossly unchanged.  Superimposed mild interstitial edema is suspected.   Original Report Authenticated By: Charline Bills, M.D.    Current Medications:  . ceFEPime (MAXIPIME) IV  1 g Intravenous Q8H  . feeding supplement  237 mL Oral  TID BM  . furosemide  40 mg Intravenous Q6H  . guaiFENesin  600 mg Oral BID  . isosorbide mononitrate  60 mg Oral Daily  . metoprolol tartrate  12.5 mg Oral BID  . pantoprazole  40 mg Oral Q1200  . phytonadione (VITAMIN K) IV  10 mg Intravenous Once  . phytonadione  10 mg Oral Once  . pravastatin  40 mg Oral QPC supper  . sodium chloride  3 mL Intravenous Q12H  . vancomycin  2,000 mg Intravenous Once  . vancomycin  1,000 mg Intravenous Q12H  . Warfarin - Pharmacist Dosing Inpatient   Does not apply q1800      ASSESSMENT AND PLAN: Active Problems:  Atrial fibrillation with RVR - Likely secondary to pulmonary process and is now controlled on oral dosing diltiazem 60 mg oral q 6 hours and  metoprolol 25 BID PO.  Will follow. HR 80-90 today. No pauses. . Will follow.   Chronic systolic CHF (congestive heart failure), NYHA class 3 - Rec'd IV Lasix 40 mg x 3 doses 10/31 per CCM. I/O negative and wt decreased. BUN/Cr improved. Getting IV Lasix x 1 dose post-transfusion. Further diuresis per CCM. May be able to switch to oral after transfusion for diuresis needs.   Otherwise, per CCM  IMPLANTATION OF DEFIBRILLATOR, HX OF  Iron deficiency anemia, unspecified  - Hb is 7.3 today.  Plans per Int. Med.  CAP (community acquired pneumonia)  Cardiomyopathy, nonischemic  Hypertension  Acute respiratory failure with hypoxia  Warfarin-induced coagulopathy  Mild epistaxis  CKD (chronic kidney disease) stage 2, GFR 60-89 ml/min  Acute renal failure  Thrombocytopenia    Vesta Mixer, Montez Hageman., MD, [facility] 09/17/2012, 8:29 AM   8:29 AM PGY-2

## 2012-09-17 NOTE — Progress Notes (Signed)
ANTICOAGULATION/Antibiotic CONSULT NOTE - Follow Up Consult  Pharmacy Consult for Warfarin, Vancomycin, Cefepime Indication: atrial fibrillation, Pneumonia  No Known Allergies  Patient Measurements: Height: 6\' 5"  (195.6 cm) Weight: 244 lb 14.9 oz (111.1 kg) IBW/kg (Calculated) : 89.1   Vital Signs: Temp: 99.8 F (37.7 C) (11/03 0800) Temp src: Oral (11/03 0800) BP: 115/66 mmHg (11/03 0800) Pulse Rate: 105  (11/03 0800)  Labs:  Basename 09/17/12 0550 09/16/12 0645 09/16/12 0525 09/15/12 1334 09/15/12 0500  HGB 7.3* -- 6.5* -- --  HCT 22.5* -- 20.6* 24.0* --  PLT 228 -- 216 239 --  APTT -- 38* -- -- --  LABPROT 19.2* -- 18.9* -- 19.1*  INR 1.68* -- 1.64* -- 1.66*  HEPARINUNFRC -- -- -- -- --  CREATININE 1.13 -- 1.02 -- 1.00  CKTOTAL -- -- -- -- --  CKMB -- -- -- -- --  TROPONINI -- -- -- -- --    Estimated Creatinine Clearance: 103.5 ml/min (by C-G formula based on Cr of 1.13).   Medications:  Prescriptions prior to admission  Medication Sig Dispense Refill  . Cholecalciferol (VITAMIN D3) 2000 UNITS capsule Take 2,000 Units by mouth daily.        Marland Kitchen diltiazem (CARDIZEM CD) 180 MG 24 hr capsule Take 180 mg by mouth daily.      . finasteride (PROSCAR) 5 MG tablet Take 5 mg by mouth daily.       Marland Kitchen lisinopril (PRINIVIL,ZESTRIL) 10 MG tablet Take 1 tablet (10 mg total) by mouth daily.  90 tablet  3  . metoprolol (LOPRESSOR) 100 MG tablet Take 1 tablet (100 mg total) by mouth 2 (two) times daily.  180 tablet  3  . polyethylene glycol powder (GLYCOLAX/MIRALAX) powder Take 17 g by mouth daily as needed. For constipation - mix with 8 oz liquid and drink      . potassium chloride (MICRO-K) 10 MEQ CR capsule Take 1 capsule (10 mEq total) by mouth daily.  90 capsule  3  . pravastatin (PRAVACHOL) 40 MG tablet Take 40 mg by mouth at bedtime.       Marland Kitchen warfarin (COUMADIN) 2 MG tablet Take 2 mg by mouth See admin instructions. Take with 5 mg tablet on Saturday, Sunday, Monday, Tuesday and  Thursday      . warfarin (COUMADIN) 3 MG tablet Take 3 mg by mouth See admin instructions. Take with 5 mg tablet on Wednesday and Friday      . warfarin (COUMADIN) 5 MG tablet Take 5 mg by mouth See admin instructions. On Wednesday and Friday, take with 3 mg tablet for an 8 mg dose.  On all other days take with 2 mg tablet for a 7 mg dose        Admit Complaint: 56 y.o.  male  admitted 09/06/2012 with PNA (CAP) in setting of known CHF and Afib.  Pharmacy consulted to dose warfarin, and vancomycin.  Overnight Events: 09/17/2012 stabilizing after 1 unit PRBCs  Warfarin PTA:  8 mg wed, fri, 7 mg other days of the week  AC: afib, . Coumadin now on hold 2/2 anemia and possible GI blood loss (reports of blood tinged sputum) with last dose taken 10/22. Several doses of Vit. K given, INR today 09/15/2012 is 1.68 - with hgb remains down 7.3 s/p transfusion 11/2 to  hold warfarin today  ID: PNA, WBC trend up 12.2, Afeb, per CCM to complete 10 d course cefepime (stop 11/5, and stop Vanc in the same time) 10/24  Blood-ngtd 10/24 MRSA PCR-neg 10/24 Strep, Legionella urinary antigen-neg 10/26 Blood-NGTD 10/31 bld -ng 10/31 urine-ng  10/24 Ceftriaxone>> 10/26 10/24 Azithromycin>>10/28 10/24 Vancomycin>>10/28, 10/31 >>, 11/3 trough 17.3 (11/5) 10/24 Cefepime>>(11/5)  Cards: CHF/Afib: dilt, Lopressor, Lasix, Imdur, statin;  HR 70-90, BP stable, Unable to assess EF on echo -poor window, LV appears moderately depressed,   Endo: BG wnl, TSH 2.9  NF:AOZH nml; Cardiac diet; PO PPI  Renal:SCr 1.0, decreasing, as high as 2.17, BUN decreasing too; all lytes nml;  Pulm: 2L [place]  Heme/Onc: Hgb 7.9>>6.5., platelets stable no active bleeding noted  PTA Medication Issues: Home medications not ordered:  lisinopril, proscar  Best Practices:SCDs, PPI  Plan: - Hold warfarin today again  - Daily PT/INR  - Cont Cefepime/Vanc stop after 11/5 doses   - Consider IV iron for supplement  Thank you for allowing  pharmacy to be a part of this patients care team.  Lovenia Kim Pharm.D., BCPS Clinical Pharmacist 09/17/2012 9:36 AM Pager: [phone] Phone: [phone]

## 2012-09-17 NOTE — Progress Notes (Signed)
Speech Language Pathology Dysphagia Treatment Patient Details Name: [name] [name] MRN: [id number] DOB: September 17, 1956 Today's Date: 09/17/2012 Time: 1600-1630 SLP Time Calculation (min): 30 min  Assessment / Plan / Recommendation Clinical Impression  Diagnostic treatment focused on diet tolerance of dysphagia 1 (puree) and thin liquids and for possible diet upgrade. Dry coughs x2 moderately after swallow of puree consistency indicating possible residue vs. reflux. Improvement in patient's ability for self feeding as patient full assist on 11/2.  Initiation of swallow WFL with adequate laryngeal elevation as evidenced by palpation. No mechanical soft trials administered as patient politely declined.   Recommend to continue current diet consistency with continued full supervision as aspiration risk remains due to continued respiratory compromise. Completion of objective evaluation to be determined.     Diet Recommendation  Continue with Current Diet: Dysphagia 1 (puree);Thin liquid    SLP Plan Continue with current plan of care      Swallowing Goals  SLP Swallowing Goals Swallow Study Goal #2 - Progress: Progressing toward goal Swallow Study Goal #3 - Progress: Progressing toward goal  General Temperature Spikes Noted: No Respiratory Status: Supplemental O2 delivered via (comment) (nasal cannula) Behavior/Cognition: Alert;Cooperative;Pleasant mood;Confused Oral Cavity - Dentition: Edentulous Patient Positioning: Upright in bed  Oral Cavity - Oral Hygiene Does patient have any of the following "at risk" factors?: Oxygen therapy - cannula, mask, simple oxygen devices Patient is HIGH RISK - Oral Care Protocol followed (see row info): Yes Patient is AT RISK - Oral Care Protocol followed (see row info): Yes   Dysphagia Treatment Treatment focused on: Skilled observation of diet tolerance;Patient/family/caregiver education;Facilitation of pharyngeal phase Treatment Methods/Modalities:  Skilled observation;Differential diagnosis Patient observed directly with PO's: Yes Type of PO's observed: Dysphagia 1 (puree);Thin liquids Feeding: Able to feed self Liquids provided via: Cup;Straw Pharyngeal Phase Signs & Symptoms: Delayed cough Type of cueing: Verbal Amount of cueing: Minimal   GO    Moreen Fowler MS, CCC-SLP [phone] [facility] 09/17/2012, 5:36 PM

## 2012-09-17 NOTE — Progress Notes (Signed)
Paged Dr. Sharon Seller regarding pt HR elevated to 140s and pt temp still elevated despite cooling methods (turning temp in room down, blotting with cool cloth, ice packs, etc) as well as tylenol 650 mg.  Orders were received to give pt IV tylenol and ibuprofen PRN. Will continue to monitor.  Salomon Mast, RN

## 2012-09-17 NOTE — Progress Notes (Signed)
Paged Dr. Sharon Seller regarding pt temp of 102.8.  Reported that I gave 650 mg Tylenol.  Also made MD aware of HR 120-130s.  Told to continue to monitor and repage cardiology if HR does not go back down.  Salomon Mast, RN

## 2012-09-18 ENCOUNTER — Inpatient Hospital Stay (HOSPITAL_COMMUNITY): Payer: PRIVATE HEALTH INSURANCE

## 2012-09-18 LAB — CBC
HCT: 23.7 % — ABNORMAL LOW (ref 39.0–52.0)
Hemoglobin: 7.5 g/dL — ABNORMAL LOW (ref 13.0–17.0)
WBC: 11.5 10*3/uL — ABNORMAL HIGH (ref 4.0–10.5)

## 2012-09-18 LAB — COMPREHENSIVE METABOLIC PANEL
ALT: 37 U/L (ref 0–53)
AST: 35 U/L (ref 0–37)
Alkaline Phosphatase: 121 U/L — ABNORMAL HIGH (ref 39–117)
GFR calc Af Amer: 85 mL/min — ABNORMAL LOW (ref >90)
Glucose, Bld: 118 mg/dL — ABNORMAL HIGH (ref 70–99)
Potassium: 5.1 mEq/L (ref 3.5–5.1)
Sodium: 134 mEq/L — ABNORMAL LOW (ref 135–145)
Total Protein: 8.7 g/dL — ABNORMAL HIGH (ref 6.0–8.3)

## 2012-09-18 LAB — RENAL FUNCTION PANEL
Albumin: 1.7 g/dL — ABNORMAL LOW (ref 3.5–5.2)
BUN: 36 mg/dL — ABNORMAL HIGH (ref 6–23)
Chloride: 82 mEq/L — ABNORMAL LOW (ref 96–112)
GFR calc Af Amer: >90 mL/min (ref >90)
Glucose, Bld: 118 mg/dL — ABNORMAL HIGH (ref 70–99)
Potassium: 4.4 mEq/L (ref 3.5–5.1)

## 2012-09-18 LAB — PROCALCITONIN: Procalcitonin: 0.45 ng/mL

## 2012-09-18 NOTE — Progress Notes (Signed)
Occupational Therapy Treatment Patient Details Name: JABER DUNLOW MRN: [id number] DOB: [birth year] Today's Date: 09/18/2012 Time: 8657-8469 OT Time Calculation (min): 1344 min  OT Assessment / Plan / Recommendation Comments on Treatment Session Pt. doing better with functional mobility as needed for BADLs.  Pt. instructed in Bil. UE AROM exercises to increase strength/ROM.  Rt. shoulder 2/5.    Follow Up Recommendations  Skilled nursing facility    Barriers to Discharge       Equipment Recommendations  None recommended by PT;None recommended by OT    Recommendations for Other Services    Frequency Min 2X/week   Plan Discharge plan remains appropriate    Precautions / Restrictions Precautions Precautions: Fall Restrictions Weight Bearing Restrictions: No   Pertinent Vitals/Pain     ADL  Lower Body Dressing: Maximal assistance;Simulated Where Assessed - Lower Body Dressing: Supported sit to stand Transfers/Ambulation Related to ADLs: total A +2 (pt ~80%)    OT Diagnosis:    OT Problem List:   OT Treatment Interventions:     OT Goals ADL Goals ADL Goal: Grooming - Progress: Progressing toward goals ADL Goal: Toilet Transfer - Progress: Progressing toward goals  Visit Information  Last OT Received On: 09/18/12 Assistance Needed: +2 (to progress ambulation/activity) PT/OT Co-Evaluation/Treatment: Yes    Subjective Data      Prior Functioning       Cognition  Overall Cognitive Status: Impaired Area of Impairment: Attention;Safety/judgement;Awareness of deficits Arousal/Alertness: Awake/alert Orientation Level: Appears intact for tasks assessed Behavior During Session: [facility] for tasks performed Current Attention Level: Sustained Safety/Judgement: Impulsive Safety/Judgement - Other Comments: standing prior to safe set up Awareness of Deficits: pt falling to the right with standing, knees slowly flexing needing cues to correct and attend Cognition - Other  Comments: slower processing    Mobility  Shoulder Instructions Bed Mobility Bed Mobility: Supine to Sit;Sitting - Scoot to Edge of Bed Supine to Sit: 4: Min assist;HOB elevated Sitting - Scoot to [place] of Bed: 4: Min guard Details for Bed Mobility Assistance: min tactile assist to sequence movement Transfers Transfers: Sit to Stand;Stand to Sit Sit to Stand: With upper extremity assist;From bed;From chair/3-in-1;With armrests;4: Min assist (with extra person for safety) Stand to Sit: To bed;To chair/3-in-1;With upper extremity assist;With armrests;4: Min assist Details for Transfer Assistance: facilitation and verbal cues for tall posture, upward facilitation on pelvis to assist with knee extension as with prolonged standing pts knees begin to slowly flex from fatigue; cues for safe hand placement with RW and safe sequencing, stepping and cues for tall posture for SPT (pt slightly impulsive)       Exercises  General Exercises - Upper Extremity Shoulder Flexion: AAROM;AROM;10 reps;Left;Right;Seated (AAROM Rt. due to 2/5 strength) General Exercises - Lower Extremity Ankle Circles/Pumps: AROM;Both;10 reps;Seated Heel Raises: AROM;Both;10 reps;Seated   Balance Balance Balance Assessed: Yes Static Sitting Balance Static Sitting - Balance Support: No upper extremity supported Static Sitting - Level of Assistance: 1: +2 Total assist (70%) Static Sitting - Comment/# of Minutes: pt stood initially with bilateral gaurding for safety, with prolonged standing pt listing to the right with gradual flexion of right knee (poor awareness); with RW pt able to support himself better using upper extremities however still needing cues to stay tall Static Standing Balance Static Standing - Balance Support: Bilateral upper extremity supported Static Standing - Level of Assistance: 1: +2 Total assist (70%) Static Standing - Comment/# of Minutes: pt stood initially with bilateral gaurding for safety, with  prolonged standing pt  listing to the right with gradual flexion of right knee (poor awareness); with RW pt able to support himself better using upper extremities however still needing cues to stay tall   End of Session OT - End of Session Equipment Utilized During Treatment: Gait belt Activity Tolerance: Patient tolerated treatment well Patient left: in chair;with call bell/phone within reach Nurse Communication: Mobility status;Other (comment)  GO     Jaivion Kingsley, Ursula Alert M 09/18/2012, 2:44 PM

## 2012-09-18 NOTE — Progress Notes (Signed)
Speech Language Pathology Dysphagia Treatment Patient Details Name: [name] [name] MRN: [id number] DOB: 10-14-1956 Today's Date: 09/18/2012 Time: 1140-1200 SLP Time Calculation (min): 20 min  Assessment / Plan / Recommendation Clinical Impression  Pt. seen for dysphagia treatment focusing on safety with recommended diet and trials of upgraded PO textures. Pt. is pleasant and cooperative at bedside, requiring minimal verbal/tactile cues to consume POs and implement compensatory strategies. Pt. observed with graham cracker for possible diet texture upgrade with prolonged oral phase due to impaired manipulation/mastication and bolus formation (no dentures present; pt. reports not using dentures while eating). Pharyngeal phase marked by immediate cough with cracker, likely due to minimal premature escape of bolus posteriorly. Given clinical observations at bedside, recommend continuing Dys 1. Diet with thin liquids (straws okay). Will continue to follow to determine safety with recommended diet and possible diet advancement.     Diet Recommendation  Continue with Current Diet: Dysphagia 1 (puree);Thin liquid    SLP Plan Continue with current plan of care      Swallowing Goals  SLP Swallowing Goals Patient will consume recommended diet without observed clinical signs of aspiration with: Minimal assistance Swallow Study Goal #1 - Progress: Progressing toward goal Patient will utilize recommended strategies during swallow to increase swallowing safety with: Minimal assistance Swallow Study Goal #2 - Progress: Progressing toward goal Goal #3: Patient will consume clinician provided po trials for differential diagnosis of swallowing function with moderate cues for use of compensatory strategies.  Swallow Study Goal #3 - Progress: Met  General Temperature Spikes Noted: Yes Respiratory Status: Supplemental O2 delivered via (comment) Behavior/Cognition: Alert;Cooperative;Pleasant  mood;Confused Oral Cavity - Dentition: Edentulous Patient Positioning: Upright in bed  Oral Cavity - Oral Hygiene Does patient have any of the following "at risk" factors?: Oxygen therapy - cannula, mask, simple oxygen devices;Tongue - coated;Other - dysphagia;Lips - dry, cracked Patient is HIGH RISK - Oral Care Protocol followed (see row info): Yes Patient is AT RISK - Oral Care Protocol followed (see row info): Yes   Dysphagia Treatment Treatment focused on: Skilled observation of diet tolerance;Upgraded PO texture trials;Patient/family/caregiver education;Utilization of compensatory strategies Treatment Methods/Modalities: Skilled observation Patient observed directly with PO's: Yes Type of PO's observed: Regular;Dysphagia 1 (puree);Thin liquids Feeding: Able to feed self;Needs assist Liquids provided via: Cup;Straw Oral Phase Signs & Symptoms: Prolonged bolus formation;Prolonged mastication;Prolonged oral phase Pharyngeal Phase Signs & Symptoms: Immediate cough Type of cueing: Verbal;Tactile Amount of cueing: Minimal   GO     [name] [name] 09/18/2012, 2:00 PM

## 2012-09-18 NOTE — Progress Notes (Signed)
[facility] Progress Note [place] TEAM 1 - Stepdown/ICU TEAM   TREI SCHOCH OZD:[id number] DOB: 1956/01/25 DOA: 09/06/2012 PCP: [name], MD  Brief narrative: 56 year old patient with long-standing chronic severe systolic dysfunction with an EF of around 35%. He presented on 09/06/2012 with complaints of dyspnea, cough with productive sputum, fevers and chills felt to be related to pneumonia. He was also found to be anemic. Previous hemoglobin in 2008 was 13 and was down to just above 8. He had persistent fevers and worsening respiratory status after admission so was transferred to the ICU on 09/09/2012. PCCM assumed care of the patient because of respiratory difficulties. Since admission patient developed issues with atrial fibrillation and RVR that required cardiology consultation and the patient rate had been stabilized on diltiazem drip and he had subsequently been converted to oral Cardizem. He also continued to have respiratory problems that were associated with superimposed heart failure presumed secondary to stressors of chronic anemia and pneumonia. Because of this cardiology has started the patient on a milrinone infusion. [facility] reassumed care of this patient on 09/14/2012 but he once again decompensated from a respiratory standpoint and required transfer back to the ICU on the same date. It was felt his respiratory symptoms were related to aspiration from recent severe epistaxis. He subsequently restarted her eyes to it was deemed once again appropriate to transfer to the step down unit.  Significant PMHx of systolic CHF (EF 35 to 40%), HTN, A fib, s/p AICD  10/24 Transfuse 2 units PRBC  10/25 Recurrent fever, hypoxia  10/26 Transfer to ICU 10/31 transfer to step down-back to see on the same day 11/03 transfer back to step down  Assessment/Plan:  Acute respiratory failure Still appears to be mildly tahcypneic to exam today - cont to monitor in SDU due to  risk for further acute decline  Severe community-acquired pneumonia Cont current abx tx plan - f/u CXR 09/18/2012 read as improving by the radiologist but per Dr. 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 Tx as per Cardiolgy  Atrial fibrillation Mild tachycardia at present - follow trend  - Cardiology following  Hypertension reasonably well controlled at this time  Hyperlipidemia  Acute on chronic renal failure Baseline creatinine 1.2 - crt stable at this time  Anemia of chronic disease with acute blood loss anemia due to epistaxis Due to epistaxis in setting of coagulopathy - goal is to keep hemoglobin greater than 7 - received additional 2U PRBC 11/02, for a total of 4U thus far - cont to follow trend - continues to report coughing up some blood and suspect has subtle recurrent epistaxis  Coagulopathy - warfarin-induced Likely due to interaction of antibiotic with Coumadin  Suspected sleep apnea Will need outpatient sleep study  Code Status: Full Disposition Plan: Step down unit  Consultants: [facility]  GI Elnoria Howard)  Procedures: Echo: 10/28: LVEF moderately depressed, hypokinesis of inferior wall, mild LVH  Antibiotics: Zithromax 10/23>>10/28  Rocephin 10/23>>10/26  Cefepime 10/26>>  Vancomycin 10/26>>10/29 + 10/31>>>  DVT prophylaxis: SCDs until coumadin resumed  HPI/Subjective: Less tachypnea than previous day.  Denies cp, n/v, or sob, despite evident tachypnea.  Objective: Blood pressure 131/76, pulse 92, temperature 99.4 F (37.4 C), temperature source Oral, resp. rate 27,  height 6\' 5"  (1.956 m), weight 111.1 kg (244 lb 14.9 oz), SpO2 99.00%.  Intake/Output Summary (Last 24 hours) at 09/18/12 1152 Last data filed at 09/18/12 0830  Gross per 24 hour  Intake   1647 ml  Output   2150 ml  Net   -503 ml     Exam: General: mild resp distress at rest - able to complete full sentences w/o pausing Lungs: faint crackles noted diffusely, worse in B bases - no wheeze Cardiovascular: Occasional intermittent mild tachycardia at ~108, no murmur gallop or rub appreciable  Abdomen: Nontender, nondistended, soft, bowel sounds positive, no rebound, no ascites, no appreciable mass Musculoskeletal: No significant cyanosis, clubbing of bilateral lower extremities Neurological: Patient is alert and awake. Denies shortness of breath or chest pain. States is hungry  Data Reviewed: Basic Metabolic Panel:  Lab 09/18/12 1914 09/18/12 0014 09/17/12 0550 09/16/12 0525 09/15/12 0500  NA 134* 153* 135 138 138  K 5.1 4.4 5.0 4.7 3.8  CL 102 82* 104 107 106  CO2 26 18* 24 24 23   GLUCOSE 118* 118* 122* 117* 127*  BUN 38* 36* 36* 34* 39*  CREATININE 1.11 1.03 1.13 1.02 1.00  CALCIUM 9.1 7.6* 9.1 8.9 9.0  MG -- -- 1.9 1.8 1.9  PHOS -- 3.2 3.7 3.2 3.4   CBC:  Lab 09/18/12 0014 09/17/12 1600 09/17/12 0550 09/16/12 0525 09/15/12 1334  WBC 11.5* 13.9* 12.2* 11.8* 13.4*  NEUTROABS -- -- -- -- --  HGB 7.5* 7.8* 7.3* 6.5* 7.7*  HCT 23.7* 24.5* 22.5* 20.6* 24.0*  MCV 85.3 84.5 84.3 83.7 84.8  PLT 251 267 228 216 239   BNP (last 3 results)  Basename 09/10/12 0445 09/09/12 0354 09/08/12 0306  PROBNP 5889.0* 4957.0* 6800.0*    Recent Results (from the past 240 hour(s))  CULTURE, BLOOD (ROUTINE X 2)     Status: Normal   Collection Time   09/09/12  9:17 PM      Component Value Range Status Comment   Specimen Description BLOOD LEFT HAND   Final    Special Requests BOTTLES DRAWN AEROBIC ONLY 5CC   Final    Culture  Setup Time 09/10/2012 01:59   Final    Culture NO GROWTH 5 DAYS    Final    Report Status 09/16/2012 FINAL   Final   CULTURE, BLOOD (ROUTINE X 2)     Status: Normal   Collection Time   09/09/12  9:26 PM      Component Value Range Status Comment   Specimen Description BLOOD RIGHT HAND   Final    Special Requests BOTTLES DRAWN AEROBIC ONLY 1CC   Final    Culture  Setup Time 09/10/2012 01:59   Final    Culture NO GROWTH 5 DAYS   Final    Report Status 09/16/2012 FINAL   Final   MRSA PCR SCREENING     Status: Normal   Collection Time   09/09/12 10:34 PM      Component Value Range Status Comment   MRSA by PCR NEGATIVE  NEGATIVE Final   CULTURE, BLOOD (ROUTINE X 2)     Status: Normal (Preliminary result)   Collection Time   09/14/12  4:00 PM      Component Value Range Status Comment   Specimen Description BLOOD RIGHT HAND   Final    Special Requests BOTTLES DRAWN AEROBIC AND ANAEROBIC  10CC   Final    Culture  Setup Time 09/14/2012 22:39   Final    Culture     Final    Value:        BLOOD CULTURE RECEIVED NO GROWTH TO DATE CULTURE WILL BE HELD FOR 5 DAYS BEFORE ISSUING A FINAL NEGATIVE REPORT   Report Status PENDING   Incomplete   CULTURE, BLOOD (ROUTINE X 2)     Status: Normal (Preliminary result)   Collection Time   09/14/12  4:06 PM      Component Value Range Status Comment   Specimen Description BLOOD LEFT ARM   Final    Special Requests BOTTLES DRAWN AEROBIC AND ANAEROBIC 10CC   Final    Culture  Setup Time 09/14/2012 22:39   Final    Culture     Final    Value:        BLOOD CULTURE RECEIVED NO GROWTH TO DATE CULTURE WILL BE HELD FOR 5 DAYS BEFORE ISSUING A FINAL NEGATIVE REPORT   Report Status PENDING   Incomplete   URINE CULTURE     Status: Normal   Collection Time   09/14/12  9:31 PM      Component Value Range Status Comment   Specimen Description URINE, CLEAN CATCH   Final    Special Requests NONE   Final    Culture  Setup Time 09/14/2012 22:35   Final    Colony Count NO GROWTH   Final    Culture NO GROWTH   Final    Report Status  09/16/2012 FINAL   Final      Studies:  Recent x-ray studies have been reviewed in detail by the Attending Physician  Scheduled Meds:  Reviewed in detail by the Attending Physician   Junious Silk, ANP [facility] Office  [phone] Pager [phone]  On-Call/Text Page:      [email or web address]      password [email or web address]  If 7PM-7AM, please contact night-coverage [email or web address] Password [email or web address] 09/18/2012, 11:52 AM   LOS: 12 days   I have personally examined this patient and reviewed the entire database. I have reviewed the above note, made any necessary editorial changes, and agree with its content.  Lonia Blood, MD [facility]

## 2012-09-18 NOTE — Progress Notes (Addendum)
Patient Name: [name] [name] Date of Encounter: 09/18/2012     Active Problems:  Atrial fibrillation with RVR  Chronic systolic CHF (congestive heart failure), NYHA class 3  IMPLANTATION OF DEFIBRILLATOR, HX OF  Iron deficiency anemia, unspecified  CAP (community acquired pneumonia)  Cardiomyopathy, nonischemic  Hypertension  Acute respiratory failure with hypoxia  Warfarin-induced coagulopathy  Mild epistaxis  CKD (chronic kidney disease) stage 2, GFR 60-89 ml/min  Acute renal failure  Thrombocytopenia    SUBJECTIVE  Seems to be doing some better.  No chest pain.  HR is well controlled.    CURRENT MEDS    . [COMPLETED] acetaminophen  1,000 mg Intravenous Once  . ceFEPime (MAXIPIME) IV  1 g Intravenous Q8H  . diltiazem  60 mg Oral Q6H  . feeding supplement  237 mL Oral TID BM  . ferrous sulfate  325 mg Oral BID WC  . [COMPLETED] furosemide  40 mg Intravenous Once  . isosorbide mononitrate  60 mg Oral Daily  . lisinopril  5 mg Oral Daily  . metoprolol tartrate  37.5 mg Oral BID  . pantoprazole  40 mg Oral Q1200  . pravastatin  40 mg Oral QPC supper  . sodium chloride  3 mL Intravenous Q12H  . vancomycin  1,000 mg Intravenous Q12H  . [DISCONTINUED] metoprolol tartrate  25 mg Oral BID  . [DISCONTINUED] Warfarin - Pharmacist Dosing Inpatient   Does not apply q1800    OBJECTIVE  Filed Vitals:   09/17/12 2349 09/18/12 0001 09/18/12 0334 09/18/12 0745  BP: 139/67  124/71 131/76  Pulse: 108   92  Temp: 100.5 F (38.1 C)  98.6 F (37 C) 99.4 F (37.4 C)  TempSrc:  Oral Oral Oral  Resp: 22  30 27   Height:      Weight:      SpO2: 99% 99% 98% 99%    Intake/Output Summary (Last 24 hours) at 09/18/12 0803 Last data filed at 09/18/12 2130  Gross per 24 hour  Intake   1527 ml  Output   2150 ml  Net   -623 ml   Filed Weights   09/09/12 2300 09/14/12 1515 09/15/12 0800  Weight: 255 lb 1.2 oz (115.7 kg) 246 lb 14.6 oz (112 kg) 244 lb 14.9 oz (111.1 kg)     PHYSICAL EXAM  General: Pleasant, NAD. Neuro: Alert and oriented X 3. Moves all extremities spontaneously. Psych: Normal affect. HEENT:  Normal  Neck: Supple without bruits or JVD. Lungs:  Some expiratory wheezes Heart: irregularly, irregular rhythm without def murmur.   Abdomen: Soft, non-tender, non-distended, BS + x 4.  Extremities: No clubbing, cyanosis or edema. DP/PT/Radials 2+ and equal bilaterally.  Accessory Clinical Findings  CBC  Basename 09/18/12 0014 09/17/12 1600  WBC 11.5* 13.9*  NEUTROABS -- --  HGB 7.5* 7.8*  HCT 23.7* 24.5*  MCV 85.3 84.5  PLT 251 267   Basic Metabolic Panel  Basename 09/18/12 0014 09/17/12 0550 09/16/12 0525  NA 153* 135 --  K 4.4 5.0 --  CL 82* 104 --  CO2 18* 24 --  GLUCOSE 118* 122* --  BUN 36* 36* --  CREATININE 1.03 1.13 --  CALCIUM 7.6* 9.1 --  MG -- 1.9 1.8  PHOS 3.2 3.7 --   Liver Function Tests  Basename 09/18/12 0014  AST --  ALT --  ALKPHOS --  BILITOT --  PROT --  ALBUMIN 1.7*   No results found for this basename: LIPASE:2,AMYLASE:2 in the last 72 hours  Cardiac Enzymes No results found for this basename: CKTOTAL:3,CKMB:3,CKMBINDEX:3,TROPONINI:3 in the last 72 hours BNP No components found with this basename: POCBNP:3 D-Dimer No results found for this basename: DDIMER:2 in the last 72 hours Hemoglobin A1C No results found for this basename: HGBA1C in the last 72 hours Fasting Lipid Panel No results found for this basename: CHOL,HDL,LDLCALC,TRIG,CHOLHDL,LDLDIRECT in the last 72 hours Thyroid Function Tests No results found for this basename: TSH,T4TOTAL,FREET3,T3FREE,THYROIDAB in the last 72 hours  TELE  Rate about 70 afib.    ECG    Radiology/Studies  Dg Chest 2 View  09/06/2012  *RADIOLOGY REPORT*  Clinical Data: Shortness of breath  CHEST - 2 VIEW  Comparison: 05/22/2010  Findings: Cardiomegaly.  Central vascular congestion. Changed left chest wall battery pack with additional lead tip  since the prior. Bilateral patchy airspace opacities, right greater than left.  No pleural effusion.  No pneumothorax.  No acute osseous finding. There is a rounded calcific density projecting over the left upper quadrant, perhaps a splenic cyst, similar to prior.  IMPRESSION: Cardiomegaly with central vascular congestion.  Patchy airspace opacities are nonspecific.  Favored to reflect multifocal pneumonia versus pulmonary edema. Recommend radiograph follow-up after appropriate management to document resolution.   Original Report Authenticated By: Waneta Martins, M.D.    Dg Chest Port 1 View  09/18/2012  *RADIOLOGY REPORT*  Clinical Data: Follow up infiltrate  PORTABLE CHEST - 1 VIEW  Comparison: 09/15/2012  Findings: Stable cardiomegaly.  Heterogeneous opacities throughout the right lung are stable.  Left subclavian AICD device unchanged. No pneumothorax.  Minimal airspace disease at the left base has nearly resolved.  IMPRESSION: Improved airspace disease at the left base.  Stable airspace disease throughout the right lung.   Original Report Authenticated By: Jolaine Click, M.D.    Dg Chest Port 1 View  09/15/2012  *RADIOLOGY REPORT*  Clinical Data: Respiratory failure  PORTABLE CHEST - 1 VIEW  Comparison: Yesterday  Findings: Stable cardiomegaly.    Stable patchy airspace disease throughout the right lung and left base.  No pneumothorax.  Stable left subclavian AICD device.  IMPRESSION: Stable bilateral airspace disease right greater than left.   Original Report Authenticated By: Jolaine Click, M.D.    Dg Chest Port 1 View  09/14/2012  *RADIOLOGY REPORT*  Clinical Data: Respiratory failure.  Pulmonary infiltrate.  PORTABLE CHEST - 1 VIEW 4:46 p.m.  Comparison: 09/14/2012 at 4:54 p.m., 10/30 and 09/12/2012  Findings: There has been partial clearing of the diffuse infiltrate in the right lung, most prominent at the base.  Chronic cardiomegaly.  Pulmonary vascularity is normal.  Left lung is clear.   Transvenous defibrillator in place.  IMPRESSION: Partial clearing of the infiltrate in the right lung.   Original Report Authenticated By: Francene Boyers, M.D.    Dg Chest Port 1 View  09/14/2012  *RADIOLOGY REPORT*  Clinical Data: Shortness of breath, pneumonia  PORTABLE CHEST - 1 VIEW  Comparison: 09/13/2012  Findings: Multifocal patchy right lung opacities, suspicious for pneumonia, grossly unchanged.  Superimposed mild interstitial edema is suspected.  Patchy left basilar atelectasis.  No pneumothorax.  Cardiomegaly.  Left subclavian ICD.  IMPRESSION: Multifocal patchy right lung opacities, suspicious for pneumonia, grossly unchanged.  Superimposed mild interstitial edema is suspected.   Original Report Authenticated By: Charline Bills, M.D.    Dg Chest Port 1 View  09/13/2012  *RADIOLOGY REPORT*  Clinical Data: Shortness of breath.  Pneumonia on the right.  PORTABLE CHEST - 1 VIEW  Comparison: Chest 09/11/2012 and  09/12/2012.  Findings: Extensive airspace disease throughout the right chest which appears worst in the base is unchanged.  There is also some new left basilar airspace opacity.  Cardiomegaly is noted.  No pneumothorax or pleural effusion.  IMPRESSION: No change in right side airspace disease.  New mild appearing left basilar opacity is likely atelectasis.   Original Report Authenticated By: Bernadene Bell. Maricela Curet, M.D.    Dg Chest Port 1 View  09/12/2012  *RADIOLOGY REPORT*  Clinical Data: Shortness of breath.  Pneumonia.  PORTABLE CHEST - 1 VIEW  Comparison: Chest 09/09/2012 and 09/11/2012.  Findings: Extensive airspace disease throughout the right chest does not appear changed since yesterday's study.  There is better visualization of the left hemidiaphragm compatible with improved aeration on today's exam.  Marked enlargement of the cardiopericardial silhouette is again seen.  IMPRESSION:  1.  No change in extensive right side airspace disease most consistent with pneumonia. 2.  Improved  left basilar aeration. 3.  Marked enlargement cardiopericardial silhouette compatible with cardiomegaly and/or pericardial effusion.   Original Report Authenticated By: Bernadene Bell. Maricela Curet, M.D.    Dg Chest Port 1 View  09/11/2012  *RADIOLOGY REPORT*  Clinical Data: Follow up pneumonia, shortness of breath.  PORTABLE CHEST - 1 VIEW  Comparison: 09/09/2012.  Findings: Trachea is midline.  Pacemaker and AICD lead tips are stable in position.  Heart is enlarged.  There is diffuse bilateral air space disease, right greater than left.  No definite pleural fluid.  IMPRESSION: Diffuse bilateral air space disease, right greater than left, possibly due to edema.  Developing pneumonia is another consideration.   Original Report Authenticated By: Reyes Ivan, M.D.    Dg Chest Port 1 View  09/09/2012  *RADIOLOGY REPORT*  Clinical Data: Congestive heart failure and pneumonia.  PORTABLE CHEST - 1 VIEW  Comparison: One-view chest 09/07/2012.  Findings: Cardiac enlargement is again noted.  There is further loss of lung volumes.  Progressive bibasilar airspace opacities are noted.  Mild edema is stable.  Bilateral pleural effusions are suspected.  IMPRESSION:  1.  Continued volume loss and progressive bibasilar airspace disease.  This may represent worsening atelectasis and edema although bronchopneumonia is not excluded. 2.  Persistent interstitial disease compatible with edema and congestive heart failure.   Original Report Authenticated By: Jamesetta Orleans. MATTERN, M.D.    Dg Chest Port 1 View  09/07/2012  *RADIOLOGY REPORT*  Clinical Data: 56 year old male respiratory distress, right lower chest pain and productive cough.  PORTABLE CHEST - 1 VIEW  Comparison: 09/06/2012 and earlier.  Findings: Semi a upright AP portable view 1412 hours.  Stable cardiomegaly.  Left chest cardiac AICD again noted.  No definite effusion.  Patchy right lung opacity has increased in confluent. Less pronounced left perihilar opacity  is stable or slightly increased.  No pneumothorax. Visualized tracheal air column is within normal limits.  IMPRESSION: Interval increased confluence of patchy right lung opacity.  Lesser left perihilar opacity is more stable.  Favor multilobar infection over asymmetric edema at this point.   Original Report Authenticated By: Harley Hallmark, M.D.     ASSESSMENT AND PLAN  1.  Atrial fib with controlled ventricular response.   2.  R lung pneumonia 3.  Significant anemia. 4.  Lower CO2  --?? Etiology.     Plan  1.  Rate control with oral meds. 2. Continue treatment of pneumonia 3. ? Status of anticoagulation--need to review with primary care 4.  Repeat BMET   Signed, Maisie Fus  Riley Kill MD, [facility], FSCAI

## 2012-09-18 NOTE — Clinical Social Work Note (Addendum)
Clinical Social Worker followed up from [employer] [employer] [employer] CSW visit with the patient and confirming [facility] in [place]. CSW left a message with admission coordinator at facility regarding patient's preference. CSW will continue to follow.   11:33am CSW received call back from [place] in admissions in [place] [facility] if [place] and they will be able to accept patient. CSW will update facility as patient becomes medically stable for discharge to facility.  Rozetta Nunnery MSW, [employer] [phone]

## 2012-09-18 NOTE — Progress Notes (Signed)
SLP has reviewed and agrees with student's note below.  Saleem Coccia Willis Mrytle Bento M.Ed CCC-SLP Pager [phone]  09/18/2012  

## 2012-09-18 NOTE — Progress Notes (Signed)
Physical Therapy Treatment Patient Details Name: [name] [name] MRN: [id number] DOB: 09/02/1956 Today's Date: 09/18/2012 Time: 1478-2956 PT Time Calculation (min): 25 min  PT Assessment / Plan / Recommendation Comments on Treatment Session  [name] [name] is progressing well with therapies, still limited by fatigue. Educated pt on HEP of ankle pumps and to advocate for OOB to recliner daily to improve his activity tolerance.     Follow Up Recommendations  Post acute inpatient     Does the patient have the potential to tolerate intense rehabilitation  No, Recommend SNF  Barriers to Discharge        Equipment Recommendations  None recommended by PT    Recommendations for Other Services    Frequency Min 3X/week   Plan Discharge plan remains appropriate;Frequency remains appropriate    Precautions / Restrictions Precautions Precautions: Fall Restrictions Weight Bearing Restrictions: No       Mobility  Bed Mobility Supine to Sit: 4: Min assist;HOB elevated (30 degrees) Details for Bed Mobility Assistance: min tactile assist to sequence movement Transfers Transfers: Sit to Stand;Stand to Sit;Stand Pivot Transfers (3 trials) Sit to Stand: With upper extremity assist;From bed;From chair/3-in-1;4: Min assist;With armrests (with extra person for safety) Stand to Sit: To bed;To chair/3-in-1;1: +1 Total assist;With upper extremity assist;With armrests Stand Pivot Transfers: 1: +2 Total assist;With armrests (bed->recliner) Stand Pivot Transfers: Patient Percentage: 80% Details for Transfer Assistance: facilitation and verbal cues for tall posture, upward facilitation on pelvis to assist with knee extension as with prolonged standing pts knees begin to slowly flex from fatigue; cues for safe hand placement with RW and safe sequencing, stepping and cues for tall posture for SPT (pt slightly impulsive) Ambulation/Gait Ambulation/Gait Assistance: Not tested (comment)    Exercises General  Exercises - Lower Extremity Ankle Circles/Pumps: AROM;Both;10 reps;Seated Heel Raises: AROM;Both;10 reps;Seated     PT Goals Acute Rehab PT Goals PT Goal: Supine/Side to Sit - Progress: Progressing toward goal PT Goal: Sit to Stand - Progress: Progressing toward goal PT Goal: Stand to Sit - Progress: Progressing toward goal PT Goal: Ambulate - Progress: Progressing toward goal  Visit Information  Last PT Received On: 09/18/12 Assistance Needed: +2 (for safety)    Subjective Data  Subjective: I've been wanting to get out of the bed for a while.  Patient Stated Goal: to the chair   Cognition  Overall Cognitive Status: Impaired Area of Impairment: Attention;Safety/judgement;Awareness of deficits Arousal/Alertness: Awake/alert Orientation Level: Appears intact for tasks assessed Behavior During Session: [facility] for tasks performed Safety/Judgement: Impulsive Safety/Judgement - Other Comments: standing prior to safe set up Awareness of Deficits: pt falling to the right with standing, knees slowly flexing needing cues to correct and attend Cognition - Other Comments: slower processing    Balance  Balance Balance Assessed: Yes Static Sitting Balance Static Sitting - Balance Support: No upper extremity supported Static Sitting - Level of Assistance: 1: +2 Total assist (70%) Static Sitting - Comment/# of Minutes: pt stood initially with bilateral gaurding for safety, with prolonged standing pt listing to the right with gradual flexion of right knee (poor awareness); with RW pt able to support himself better using upper extremities however still needing cues to stay tall  End of Session PT - End of Session Equipment Utilized During Treatment: Gait belt Activity Tolerance: Patient tolerated treatment well;Patient limited by fatigue Patient left: in chair;with call bell/phone within reach Nurse Communication: Mobility status   GP     [facility] HELEN 09/18/2012, 2:23 PM

## 2012-09-19 LAB — BASIC METABOLIC PANEL
BUN: 45 mg/dL — ABNORMAL HIGH (ref 6–23)
CO2: 23 mEq/L (ref 19–32)
Chloride: 99 mEq/L (ref 96–112)
Creatinine, Ser: 1.28 mg/dL (ref 0.50–1.35)
Glucose, Bld: 121 mg/dL — ABNORMAL HIGH (ref 70–99)
Potassium: 5.1 mEq/L (ref 3.5–5.1)

## 2012-09-19 LAB — CBC
HCT: 22.3 % — ABNORMAL LOW (ref 39.0–52.0)
Hemoglobin: 7.2 g/dL — ABNORMAL LOW (ref 13.0–17.0)
MCV: 84.2 fL (ref 78.0–100.0)
RBC: 2.65 MIL/uL — ABNORMAL LOW (ref 4.22–5.81)
RDW: 16.3 % — ABNORMAL HIGH (ref 11.5–15.5)
WBC: 11.4 10*3/uL — ABNORMAL HIGH (ref 4.0–10.5)

## 2012-09-19 MED ORDER — METOPROLOL TARTRATE 1 MG/ML IV SOLN
5.0000 mg | INTRAVENOUS | Status: AC
  Administered 2012-09-19: 5 mg via INTRAVENOUS
  Filled 2012-09-19: qty 5

## 2012-09-19 MED ORDER — FUROSEMIDE 40 MG PO TABS
40.0000 mg | ORAL_TABLET | Freq: Every day | ORAL | Status: DC
  Administered 2012-09-19 – 2012-09-29 (×11): 40 mg via ORAL
  Filled 2012-09-19 (×11): qty 1

## 2012-09-19 MED ORDER — DILTIAZEM HCL 90 MG PO TABS
90.0000 mg | ORAL_TABLET | Freq: Four times a day (QID) | ORAL | Status: DC
  Administered 2012-09-19 – 2012-09-22 (×13): 90 mg via ORAL
  Filled 2012-09-19 (×17): qty 1

## 2012-09-19 NOTE — Progress Notes (Signed)
CARDIOLOGY ROUNDING NOTE    Patient Name: [name] [name] Date of Encounter: 09-12-2012    SUBJECTIVE:Patient still short of breath but improved.   No chest pain.  Making very slow progress.  V rates intermittently elevated  TELEMETRY: Reviewed telemetry pt in atrial fibrillation, v rates 60s though did spike to 130s overnight Filed Vitals:   09/19/12 0354 09/19/12 0400 09/19/12 0451 09/19/12 0800  BP:  116/55  120/58  Pulse:  110    Temp: 102.8 F (39.3 C)   97.8 F (36.6 C)  TempSrc: Oral   Oral  Resp:  24  24  Height:      Weight:   241 lb 6.5 oz (109.5 kg)   SpO2:  97% 98% 98%    Intake/Output Summary (Last 24 hours) at 09/19/12 0825 Last data filed at 09/19/12 0600  Gross per 24 hour  Intake   1980 ml  Output    952 ml  Net   1028 ml   GEN- The patient is ill appearing, alert and oriented x 3 today.  Head- normocephalic, atraumatic  Eyes- Sclera clear, conjunctiva pink  Ears- hearing intact  Oropharynx- clear  Neck- supple  Lungs- Clear to ausculation bilaterally with few basilar rales, normal work of breathing  Heart- irregular rate and rhythm  GI- soft, NT, ND, + BS  Extremities- no clubbing, cyanosis, + dependant edema, SCDs in place Skin- no rash or lesion  Psych- euthymic mood, full affect  Neuro- strength and sensation are intact  LABS: Basic Metabolic Panel:  Basename 09/19/12 0525 09/18/12 1017 09/18/12 0014 09/17/12 0550  NA 130* 134* -- --  K 5.1 5.1 -- --  CL 99 102 -- --  CO2 23 26 -- --  GLUCOSE 121* 118* -- --  BUN 45* 38* -- --  CREATININE 1.28 1.11 -- --  CALCIUM 8.9 9.1 -- --  MG -- -- -- 1.9  PHOS -- -- 3.2 3.7   Liver Function Tests:  Basename 09/18/12 1017 09/18/12 0014  AST 35 --  ALT 37 --  ALKPHOS 121* --  BILITOT 1.2 --  PROT 8.7* --  ALBUMIN 2.0* 1.7*   CBC:  Basename 09/19/12 0525 09/18/12 0014  WBC 11.4* 11.5*  NEUTROABS -- --  HGB 7.2* 7.5*  HCT 22.3* 23.7*  MCV 84.2 85.3  PLT 251 251   Cardiac  Enzymes: No results found for this basename: CKTOTAL:3,CKMB:3,CKMBINDEX:3,TROPONINI:3 in the last 72 hours INR: 4.85  Radiology/Studies:  Dg Chest Port 1 View 09/12/2012  *RADIOLOGY REPORT*  Clinical Data: Shortness of breath.  Pneumonia.  PORTABLE CHEST - 1 VIEW  Comparison: Chest 09/09/2012 and 09/11/2012.  Findings: Extensive airspace disease throughout the right chest does not appear changed since yesterday's study.  There is better visualization of the left hemidiaphragm compatible with improved aeration on today's exam.  Marked enlargement of the cardiopericardial silhouette is again seen.  IMPRESSION:  1.  No change in extensive right side airspace disease most consistent with pneumonia. 2.  Improved left basilar aeration. 3.  Marked enlargement cardiopericardial silhouette compatible with cardiomegaly and/or pericardial effusion.   Original Report Authenticated By: Bernadene Bell. Maricela Curet, M.D.      Current Medications:    . ceFEPime (MAXIPIME) IV  1 g Intravenous Q8H  . diltiazem  90 mg Oral Q6H  . feeding supplement  237 mL Oral TID BM  . ferrous sulfate  325 mg Oral BID WC  . isosorbide mononitrate  60 mg Oral Daily  . lisinopril  5 mg Oral Daily  . [COMPLETED] metoprolol  5 mg Intravenous NOW  . metoprolol tartrate  37.5 mg Oral BID  . pantoprazole  40 mg Oral Q1200  . pravastatin  40 mg Oral QPC supper  . sodium chloride  3 mL Intravenous Q12H  . vancomycin  1,000 mg Intravenous Q12H  . [DISCONTINUED] diltiazem  60 mg Oral Q6H        Assessment/Plan per Dr Johney Frame   1. Respiratory failure- primarily due to pneumonia, potentially worsened by CHF  Antibiotics per the primary team Keep Is and Os negative  2. Afib- elevated V rates due to underlying medical illness and anemia  Rates are presently stable on PO cardizem and metoprolol Would treat underlying issues (anemia, infection, resp failure, etc) Coumadin/ heparin on hold due to anemia  3. Acute on chronic systolic  dyfunction  Mildly volume overloaded, Creatinine is improved  Keep Is and Os negative   4. Anemia- in face of acute renal failure and afib with RVR transfusion may be beneficial  Will defer to primary team   5. Acute on chronic renal failure Concerning in setting of clinical illness Would keep patient dry  PT to ambulate today  Fayrene Fearing Laquida Cotrell,MD 09/19/2012 8:25 AM

## 2012-09-19 NOTE — Progress Notes (Signed)
Physical Therapy Treatment Patient Details Name: [name] [name] MRN: [id number] DOB: 1956-10-22 Today's Date: 09/19/2012 Time: 4098-1191 PT Time Calculation (min): 25 min  PT Assessment / Plan / Recommendation Comments on Treatment Session  Improving well! Ambulated today, still limited by fatigue but very motivated. Introduced Cytogeneticist for [employer]. Good retention of HEP provided last session.     Follow Up Recommendations  Post acute inpatient     Does the patient have the potential to tolerate intense rehabilitation  No, Recommend SNF  Barriers to Discharge        Equipment Recommendations  None recommended by PT    Recommendations for Other Services    Frequency Min 3X/week   Plan Discharge plan remains appropriate;Frequency remains appropriate    Precautions / Restrictions Precautions Precautions: Fall Restrictions Weight Bearing Restrictions: No       Mobility  Bed Mobility Bed Mobility: Not assessed Details for Bed Mobility Assistance: pt up in chair upon my arrival Transfers Sit to Stand: From chair/3-in-1;With upper extremity assist;With armrests;4: Min assist (low surface for his 6'5 height) Stand to Sit: To chair/3-in-1;With upper extremity assist;With armrests;4: Min assist Details for Transfer Assistance: cues for safe hand placement, needs extra power with rocking to propel self from lower surface in pre-lift off, tactile assist to stabilize with full stand; facilitation and cueing for controlled descent (increased difficulty given lower surface) Ambulation/Gait Ambulation/Gait Assistance: 4: Min assist Ambulation Distance (Feet): 120 Feet (seated rest break after 60 ft) Assistive device: Rolling walker Ambulation/Gait Assistance Details: cues for tall posture and safe positioning with RW, min tactile stability assist Gait Pattern: Trunk flexed (downward gaze) General Gait Details: min lateral instability     Exercises General Exercises - Upper  Extremity Shoulder Flexion: AAROM;Both;5 reps;Seated General Exercises - Lower Extremity Long Arc Quad: AROM;Both;10 reps;Seated Toe Raises: AROM;Both;10 reps;Seated Heel Raises: AROM;Both;10 reps;Seated   PT Goals Acute Rehab PT Goals PT Goal: Sit to Stand - Progress: Progressing toward goal PT Goal: Stand to Sit - Progress: Progressing toward goal PT Goal: Ambulate - Progress: Progressing toward goal Pt will Perform Home Exercise Program: Independently PT Goal: Perform Home Exercise Program - Progress: Goal set today  Visit Information  Last PT Received On: 09/19/12 Assistance Needed: +2 (chair follow for ambulation)    Subjective Data  Subjective: I want to walk. Patient Stated Goal:     Cognition  Overall Cognitive Status: Impaired Area of Impairment: Attention;Safety/judgement;Awareness of deficits Current Attention Level: Sustained;Selective Safety/Judgement: Decreased safety judgement for tasks assessed Cognition - Other Comments: cues for self monitoring and pacing himself, wants to just walk until he is exhausted    Balance     End of Session PT - End of Session Equipment Utilized During Treatment: Gait belt Activity Tolerance: Patient tolerated treatment well Patient left: in chair;with call bell/phone within reach Nurse Communication: Mobility status   GP     [facility] HELEN 09/19/2012, 9:44 AM

## 2012-09-19 NOTE — Progress Notes (Signed)
[facility] Progress Note [place] TEAM 1 - Stepdown/ICU TEAM   [name] [name] ZOX:[id number] DOB: 1956-02-17 DOA: 09/06/2012 PCP: [name], MD  Brief narrative: 56 year old patient with long-standing chronic severe systolic dysfunction with an EF of around 35%. He presented on 09/06/2012 with complaints of dyspnea, cough with productive sputum, fevers and chills felt to be related to pneumonia. He was also found to be anemic. Previous hemoglobin in 2008 was 13 and was down to just above 8. He had persistent fevers and worsening respiratory status after admission so was transferred to the ICU on 09/09/2012. PCCM assumed care of the patient because of respiratory difficulties. Since admission patient developed issues with atrial fibrillation and RVR that required cardiology consultation and the patient rate had been stabilized on diltiazem drip and he had subsequently been converted to oral Cardizem. He also continued to have respiratory problems that were associated with superimposed heart failure presumed secondary to stressors of chronic anemia and pneumonia. Because of this cardiology has started the patient on a milrinone infusion. [facility] reassumed care of this patient on 09/14/2012 but he once again decompensated from a respiratory standpoint and required transfer back to the ICU on the same date. It was felt his respiratory symptoms were related to aspiration from recent severe epistaxis. He subsequently restarted her eyes to it was deemed once again appropriate to transfer to the step down unit.  Significant PMHx of systolic CHF (EF 35 to 40%), HTN, A fib, s/p AICD  10/24 Transfuse 2 units PRBC  10/25 Recurrent fever, hypoxia  10/26 Transfer to ICU 10/31 transfer to step down-back to see on the same day 11/03 transfer back to step down  Assessment/Plan:  Acute respiratory failure Still appears to be mildly tahcypneic to exam today - cont to monitor in SDU due to  risk for further acute decline  Severe community-acquired pneumonia Cont current abx tx plan - f/u CXR 09/18/2012 without significant improvement-  continues with recurrent fevers greater than 102 with associated tachycardia -is less symptomatic and now is able to get out of bed to the chair and is utilizing a urinal  Aspiration pneumonitis/pneumonia 09/14/2012 related to episode of epistaxis Cont current abx tx plan - see above regard f/u CXR - SLP has cleared for diet   Recurrent fever Etiology still not clear-had another spike up to 102 over night: Question nocturnal aspiration versus nocturnal atelectasis-check Pro-calcitonin only mildly elevated- will follow  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 Tx as per Cardiolgy  Atrial fibrillation Mild tachycardia at present - follow trend  - Cardiology following  Hypertension reasonably well controlled at this time  Hyperlipidemia  Acute on chronic renal failure Baseline creatinine 1.2 - crt stable at this time  Anemia of chronic disease with acute blood loss anemia due to epistaxis Due to epistaxis in setting of coagulopathy - goal is to keep hemoglobin greater than 7 - received additional 2U PRBC 11/02, for a total of 4U thus far - cont to follow trend - continues to report coughing up some blood and suspect has subtle recurrent epistaxis  Coagulopathy - warfarin-induced Likely due to interaction of antibiotic with Coumadin  Suspected sleep apnea Will need outpatient sleep study  Code Status: Full Disposition Plan: Step down unit  Consultants: [facility]  GI Elnoria Howard)  Procedures: Echo: 10/28: LVEF moderately depressed, hypokinesis of inferior wall, mild LVH  Antibiotics: Zithromax 10/23>>10/28  Rocephin 10/23>>10/26  Cefepime 10/26>>  Vancomycin 10/26>>10/29 + 10/31>>>  DVT prophylaxis:  SCDs until coumadin resumed  HPI/Subjective: Alert and sitting up in the chair without any  complaints. Family at bedside endorses patient looks much better than he has in several weeks.   Objective: Blood pressure 110/72, pulse 78, temperature 98.9 F (37.2 C), temperature source Oral, resp. rate 24, height 6\' 5"  (1.956 m), weight 109.5 kg (241 lb 6.5 oz), SpO2 98.00%.  Intake/Output Summary (Last 24 hours) at 09/19/12 1235 Last data filed at 09/19/12 0800  Gross per 24 hour  Intake   1620 ml  Output   1052 ml  Net    568 ml     Exam: General: No acute distress - able to complete full sentences w/o pausing Lungs: faint crackles noted diffusely, worse in B bases - no wheeze Cardiovascular: Had significant tachycardia overnight with ventricular rates up into the 160s but this was associated with his fever, currently today while sleeping his heart rate has been as low as 59, no murmur gallop or rub appreciable  Abdomen: Nontender, nondistended, soft, bowel sounds positive, no rebound, no ascites, no appreciable mass Musculoskeletal: No significant cyanosis, clubbing of bilateral lower extremities Neurological: Patient is alert and awake. Denies shortness of breath or chest pain. States is hungry  Data Reviewed: Basic Metabolic Panel:  Lab 09/19/12 1610 09/18/12 1017 09/18/12 0014 09/17/12 0550 09/16/12 0525 09/15/12 0500  NA 130* 134* 153* 135 138 --  K 5.1 5.1 4.4 5.0 4.7 --  CL 99 102 82* 104 107 --  CO2 23 26 18* 24 24 --  GLUCOSE 121* 118* 118* 122* 117* --  BUN 45* 38* 36* 36* 34* --  CREATININE 1.28 1.11 1.03 1.13 1.02 --  CALCIUM 8.9 9.1 7.6* 9.1 8.9 --  MG -- -- -- 1.9 1.8 1.9  PHOS -- -- 3.2 3.7 3.2 3.4   CBC:  Lab 09/19/12 0525 09/18/12 0014 09/17/12 1600 09/17/12 0550 09/16/12 0525  WBC 11.4* 11.5* 13.9* 12.2* 11.8*  NEUTROABS -- -- -- -- --  HGB 7.2* 7.5* 7.8* 7.3* 6.5*  HCT 22.3* 23.7* 24.5* 22.5* 20.6*  MCV 84.2 85.3 84.5 84.3 83.7  PLT 251 251 267 228 216   BNP (last 3 results)  Basename 09/10/12 0445 09/09/12 0354 09/08/12 0306  PROBNP  5889.0* 4957.0* 6800.0*    Recent Results (from the past 240 hour(s))  CULTURE, BLOOD (ROUTINE X 2)     Status: Normal   Collection Time   09/09/12  9:17 PM      Component Value Range Status Comment   Specimen Description BLOOD LEFT HAND   Final    Special Requests BOTTLES DRAWN AEROBIC ONLY 5CC   Final    Culture  Setup Time 09/10/2012 01:59   Final    Culture NO GROWTH 5 DAYS   Final    Report Status 09/16/2012 FINAL   Final   CULTURE, BLOOD (ROUTINE X 2)     Status: Normal   Collection Time   09/09/12  9:26 PM      Component Value Range Status Comment   Specimen Description BLOOD RIGHT HAND   Final    Special Requests BOTTLES DRAWN AEROBIC ONLY 1CC   Final    Culture  Setup Time 09/10/2012 01:59   Final    Culture NO GROWTH 5 DAYS   Final    Report Status 09/16/2012 FINAL   Final   MRSA PCR SCREENING     Status: Normal   Collection Time   09/09/12 10:34 PM      Component  Value Range Status Comment   MRSA by PCR NEGATIVE  NEGATIVE Final   CULTURE, BLOOD (ROUTINE X 2)     Status: Normal (Preliminary result)   Collection Time   09/14/12  4:00 PM      Component Value Range Status Comment   Specimen Description BLOOD RIGHT HAND   Final    Special Requests BOTTLES DRAWN AEROBIC AND ANAEROBIC 10CC   Final    Culture  Setup Time 09/14/2012 22:39   Final    Culture     Final    Value:        BLOOD CULTURE RECEIVED NO GROWTH TO DATE CULTURE WILL BE HELD FOR 5 DAYS BEFORE ISSUING A FINAL NEGATIVE REPORT   Report Status PENDING   Incomplete   CULTURE, BLOOD (ROUTINE X 2)     Status: Normal (Preliminary result)   Collection Time   09/14/12  4:06 PM      Component Value Range Status Comment   Specimen Description BLOOD LEFT ARM   Final    Special Requests BOTTLES DRAWN AEROBIC AND ANAEROBIC 10CC   Final    Culture  Setup Time 09/14/2012 22:39   Final    Culture     Final    Value:        BLOOD CULTURE RECEIVED NO GROWTH TO DATE CULTURE WILL BE HELD FOR 5 DAYS BEFORE ISSUING A FINAL  NEGATIVE REPORT   Report Status PENDING   Incomplete   URINE CULTURE     Status: Normal   Collection Time   09/14/12  9:31 PM      Component Value Range Status Comment   Specimen Description URINE, CLEAN CATCH   Final    Special Requests NONE   Final    Culture  Setup Time 09/14/2012 22:35   Final    Colony Count NO GROWTH   Final    Culture NO GROWTH   Final    Report Status 09/16/2012 FINAL   Final      Studies:  Recent x-ray studies have been reviewed in detail by the Attending Physician  Scheduled Meds:  Reviewed in detail by the Attending Physician   Junious Silk, ANP [facility] Office  [phone] Pager [phone]  On-Call/Text Page:      [email or web address]      password [email or web address]  If 7PM-7AM, please contact night-coverage [email or web address] Password [email or web address] 09/19/2012, 12:35 PM   LOS: 13 days     I have examined the patient and reviewed the chart. I have modified the above note and agree with it.   Calvert Cantor, MD [phone]

## 2012-09-19 NOTE — Progress Notes (Addendum)
Speech Language Pathology Dysphagia Treatment Patient Details Name: [name] [name] MRN: [id number] DOB: May 16, 1956 Today's Date: 09/19/2012 Time: 1200-1210 SLP Time Calculation (min): 10 min  Assessment / Plan / Recommendation Clinical Impression  Treatment focused on safety and efficiency with graham cracker for possible upgrade.  Pt. orally manipulated and propelled bolus to posterior oral cavity without difficulty.  One delayed cough after cracker possibly due to dryness or posterior spill of cracker particulate.  No pharyngeal difficulty observed with thin water.  He required only minimal verbal cues for small sips.  CXR 11/4 revealed improvement in aeration. Recommend diet texture upgrade to Dys 3 continue thin (order received from NP).  ST will follow up and modify recommendations if needed.     Diet Recommendation  Initiate / Change Diet: Dysphagia 3 (mechanical soft);Thin liquid    SLP Plan Continue with current plan of care      Swallowing Goals  SLP Swallowing Goals Patient will consume recommended diet without observed clinical signs of aspiration with: Minimal assistance Swallow Study Goal #1 - Progress: Progressing toward goal Patient will utilize recommended strategies during swallow to increase swallowing safety with: Minimal assistance Swallow Study Goal #2 - Progress: Progressing toward goal Goal #3: Patient will consume clinician provided po trials for differential diagnosis of swallowing function with moderate cues for use of compensatory strategies.  Swallow Study Goal #3 - Progress: Met  General Temperature Spikes Noted: No Respiratory Status: Supplemental O2 delivered via (comment) Behavior/Cognition: Alert;Pleasant mood;Cooperative Oral Cavity - Dentition: Edentulous Patient Positioning: Upright in bed  Oral Cavity - Oral Hygiene Does patient have any of the following "at risk" factors?: Oxygen therapy - cannula, mask, simple oxygen devices;Tongue -  coated;Other - dysphagia;Lips - dry, cracked Brush patient's teeth BID with toothbrush (using toothpaste with fluoride): Yes Patient is AT RISK - Oral Care Protocol followed (see row info): Yes   Dysphagia Treatment Treatment focused on: Skilled observation of diet tolerance;Upgraded PO texture trials;Patient/family/caregiver education;Utilization of compensatory strategies Treatment Methods/Modalities: Skilled observation Patient observed directly with PO's: Yes Type of PO's observed: Dysphagia 3 (soft);Thin liquids Feeding: Able to feed self Liquids provided via: Cup Pharyngeal Phase Signs & Symptoms: Delayed cough Type of cueing: Verbal Amount of cueing: Minimal       Royce Macadamia M.Ed [employer] [phone]  09/19/2012

## 2012-09-20 DIAGNOSIS — R040 Epistaxis: Secondary | ICD-10-CM

## 2012-09-20 LAB — CULTURE, BLOOD (ROUTINE X 2): Culture: NO GROWTH

## 2012-09-20 MED ORDER — ENSURE COMPLETE PO LIQD
237.0000 mL | ORAL | Status: DC
  Administered 2012-09-21 – 2012-09-24 (×3): 237 mL via ORAL

## 2012-09-20 NOTE — Progress Notes (Signed)
Nutrition Follow-up  Intervention:   1. Decreased Ensure to daily as pt has had increased kcal intake from meals  2. RD will continue to follow    Assessment:   Transferred to stepdown on 11/3. SLP evaluation r/t question of ability to protect airway. Initially downgraded to D1 diet, now upgraded to D3 diet. Pt states his appetite is fair.   Diet Order:  Dysphagia 3, thin liquids  PO intake: ~75% Supplements: Ensure Complete TID   Meds: Scheduled Meds:   . [COMPLETED] ceFEPime (MAXIPIME) IV  1 g Intravenous Q8H  . diltiazem  90 mg Oral Q6H  . feeding supplement  237 mL Oral TID BM  . ferrous sulfate  325 mg Oral BID WC  . furosemide  40 mg Oral Daily  . isosorbide mononitrate  60 mg Oral Daily  . lisinopril  5 mg Oral Daily  . metoprolol tartrate  37.5 mg Oral BID  . pantoprazole  40 mg Oral Q1200  . pravastatin  40 mg Oral QPC supper  . sodium chloride  3 mL Intravenous Q12H  . [COMPLETED] vancomycin  1,000 mg Intravenous Q12H   Continuous Infusions:  PRN Meds:.sodium chloride, acetaminophen, ibuprofen, levalbuterol, oxyCODONE, sodium chloride   CMP     Component Value Date/Time   NA 130* 09/19/2012 0525   K 5.1 09/19/2012 0525   CL 99 09/19/2012 0525   CO2 23 09/19/2012 0525   GLUCOSE 121* 09/19/2012 0525   BUN 45* 09/19/2012 0525   CREATININE 1.28 09/19/2012 0525   CALCIUM 8.9 09/19/2012 0525   PROT 8.7* 09/18/2012 1017   ALBUMIN 2.0* 09/18/2012 1017   AST 35 09/18/2012 1017   ALT 37 09/18/2012 1017   ALKPHOS 121* 09/18/2012 1017   BILITOT 1.2 09/18/2012 1017   GFRNONAA 62* 09/19/2012 0525   GFRAA 72* 09/19/2012 0525    CBG (last 3)   Basename 09/19/12 2200  GLUCAP 141*     Intake/Output Summary (Last 24 hours) at 09/20/12 1202 Last data filed at 09/20/12 0630  Gross per 24 hour  Intake    363 ml  Output   1450 ml  Net  -1087 ml    Weight Status:  241 lbs, trending down with fluid status Net -2.3 L this admission   Re-estimated needs:  2300-2400 kcal, 100-120  gm protein   Nutrition Dx: Inadequate protein energy intake now related to poor appetite/SOB as evidenced by 10% meal completion.   Goal: Oral intake with meals & supplements to meet >/= 90% of estimated nutrition needs.  --met     Monitor:  PO intake, weight, labs, I/O's   Clarene Duke RD, LDN Pager [phone] After Hours pager [phone]

## 2012-09-20 NOTE — Progress Notes (Signed)
Pt HR went up to 140, but did not sustain and came right back down in the 90's, VS stable, pt asymptomatic. Will continue to monitor.

## 2012-09-20 NOTE — Progress Notes (Addendum)
Temp 101.4 Md notified Georgette Dover

## 2012-09-20 NOTE — Progress Notes (Addendum)
[facility] Progress Note [facility] TEAM 1 - Stepdown/ICU TEAM   [name] [name] JXB:[id number] DOB: [birth year] DOA: 09/06/2012 PCP: [name], MD  Brief narrative: 56 year old patient with long-standing chronic severe systolic dysfunction with an EF of around 35%. He presented on 09/06/2012 with complaints of dyspnea, cough with productive sputum, fevers and chills felt to be related to pneumonia. He was also found to be anemic. Previous hemoglobin in 2008 was 13 and was down to just above 8. He had persistent fevers and worsening respiratory status after admission so was transferred to the ICU on 09/09/2012. PCCM assumed care of the patient because of respiratory difficulties. Since admission patient developed issues with atrial fibrillation and RVR that required cardiology consultation and the patient rate had been stabilized on diltiazem drip and he had subsequently been converted to oral Cardizem. He also continued to have respiratory problems that were associated with superimposed heart failure presumed secondary to stressors of chronic anemia and pneumonia. Because of this cardiology has started the patient on a milrinone infusion. [facility] reassumed care of this patient on 09/14/2012 but he once again decompensated from a respiratory standpoint and required transfer back to the ICU on the same date. It was felt his respiratory symptoms were related to aspiration from recent severe epistaxis. He subsequently restarted her eyes to it was deemed once again appropriate to transfer to the step down unit.  Significant PMHx of systolic CHF (EF 35 to 40%), HTN, A fib, s/p AICD  10/24 Transfuse 2 units PRBC  10/25 Recurrent fever, hypoxia  10/26 Transfer to ICU 10/31 transfer to step down-back to see on the same day 11/03 transfer back to step down  Assessment/Plan:  Acute respiratory failure Has stabilized quite nicely-currently maintaining saturations of 96% on room  air  Severe community-acquired pneumonia Cont current abx tx plan - f/u CXR 09/18/2012 without significant improvement- did not spike a high temperature overnight and as noted above is now off oxygen  Aspiration pneumonitis/pneumonia 09/14/2012 related to episode of epistaxis Cont current abx tx plan - see above regard f/u CXR - SLP has cleared for diet and is tolerating advance to a mechanical soft diet  Recurrent fever No further temperature spikes and Procalcitonin remains stable - if develops recurrence will re-draw blood cxs  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 Tx as per Cardiology-possibility of adding hydralazine to the nitrates to help treat nonischemic cardiomyopathy if blood pressure will support  Atrial fibrillation Mild tachycardia at present - follow trend  - Cardiology following-consideration given to changing carvedilol to metoprolol for more adequate rate control in the setting of nonischemic cardiomyopathy-need to determine when/if can resume Coumadin (though clearly not ready yet as continues to have occasional episodic epistaxis)  Hypertension reasonably well controlled at this time  Hyperlipidemia  Acute on chronic renal failure Baseline creatinine 1.2 - crt stable at this time  Anemia of chronic disease with acute blood loss anemia due to epistaxis Due to epistaxis in setting of coagulopathy - goal is to keep hemoglobin greater than 7 - received additional 2U PRBC 11/02, for a total of 4U thus far - cont to follow trend - continues to report coughing up some blood and suspect has subtle recurrent epistaxis (see above regarding Coumadin)  Coagulopathy - warfarin-induced Likely due to interaction of antibiotic with Coumadin-see above regarding if/when can resume Coumadin  Suspected sleep apnea Will need outpatient sleep study  Code Status: Full Disposition Plan: Transfer to telemetry. Hopefully will be  appropriate to discharge  for skilled nursing facility in the next 48 hours  Consultants: [facility]  GI ([place])-signed off  Procedures: Echo: 10/28: LVEF moderately depressed, hypokinesis of inferior wall, mild LVH  Antibiotics: Zithromax 10/23>>10/28  Rocephin 10/23>>10/26  Cefepime 10/26>> 09/19/2012 Vancomycin 10/26>>10/29 + 10/31>>>09/19/2012  DVT prophylaxis: SCDs until coumadin resumed  HPI/Subjective: Alert and sitting up in the chair without any complaints. Tachypnea much less noticeable today.   Objective: Blood pressure 109/53, pulse 105, temperature 100.3 F (37.9 C), temperature source Oral, resp. rate 20, height 6\' 5"  (1.956 m), weight 109.5 kg (241 lb 6.5 oz), SpO2 96.00%.  Intake/Output Summary (Last 24 hours) at 09/20/12 1403 Last data filed at 09/20/12 0630  Gross per 24 hour  Intake    363 ml  Output   1450 ml  Net  -1087 ml     Exam: General: No acute distress - able to complete full sentences w/o pausing Lungs: Clear to auscultation bilaterally without crackles or wheezing, on room air, non-labored effort Cardiovascular: Remains in atrial fib with ventricular responses in the high 90s to low 100s, no peripheral edema, heart sounds S1-S2 without rubs murmurs or gallops Abdomen: Nontender, nondistended, soft, bowel sounds positive, no rebound, no ascites, no appreciable mass, tolerating solid Musculoskeletal: No significant cyanosis, clubbing of bilateral lower extremities Neurological: Alert and oriented to name time and place but seems to have difficulty with short-term memory issues especially regarding reason for being hospitalized, moves all extremities x4, exam is otherwise nonfocal  Data Reviewed: Basic Metabolic Panel:  Lab 09/19/12 6578 09/18/12 1017 09/18/12 0014 09/17/12 0550 09/16/12 0525 09/15/12 0500  NA 130* 134* 153* 135 138 --  K 5.1 5.1 4.4 5.0 4.7 --  CL 99 102 82* 104 107 --  CO2 23 26 18* 24 24 --  GLUCOSE 121* 118* 118* 122* 117* --  BUN 45* 38*  36* 36* 34* --  CREATININE 1.28 1.11 1.03 1.13 1.02 --  CALCIUM 8.9 9.1 7.6* 9.1 8.9 --  MG -- -- -- 1.9 1.8 1.9  PHOS -- -- 3.2 3.7 3.2 3.4   CBC:  Lab 09/19/12 0525 09/18/12 0014 09/17/12 1600 09/17/12 0550 09/16/12 0525  WBC 11.4* 11.5* 13.9* 12.2* 11.8*  NEUTROABS -- -- -- -- --  HGB 7.2* 7.5* 7.8* 7.3* 6.5*  HCT 22.3* 23.7* 24.5* 22.5* 20.6*  MCV 84.2 85.3 84.5 84.3 83.7  PLT 251 251 267 228 216   BNP (last 3 results)  Basename 09/10/12 0445 09/09/12 0354 09/08/12 0306  PROBNP 5889.0* 4957.0* 6800.0*    Recent Results (from the past 240 hour(s))  CULTURE, BLOOD (ROUTINE X 2)     Status: Normal   Collection Time   09/14/12  4:00 PM      Component Value Range Status Comment   Specimen Description BLOOD RIGHT HAND   Final    Special Requests BOTTLES DRAWN AEROBIC AND ANAEROBIC 10CC   Final    Culture  Setup Time 09/14/2012 22:39   Final    Culture NO GROWTH 5 DAYS   Final    Report Status 09/20/2012 FINAL   Final   CULTURE, BLOOD (ROUTINE X 2)     Status: Normal   Collection Time   09/14/12  4:06 PM      Component Value Range Status Comment   Specimen Description BLOOD LEFT ARM   Final    Special Requests BOTTLES DRAWN AEROBIC AND ANAEROBIC 10CC   Final    Culture  Setup Time 09/14/2012 22:39  Final    Culture NO GROWTH 5 DAYS   Final    Report Status 09/20/2012 FINAL   Final   URINE CULTURE     Status: Normal   Collection Time   09/14/12  9:31 PM      Component Value Range Status Comment   Specimen Description URINE, CLEAN CATCH   Final    Special Requests NONE   Final    Culture  Setup Time 09/14/2012 22:35   Final    Colony Count NO GROWTH   Final    Culture NO GROWTH   Final    Report Status 09/16/2012 FINAL   Final   CULTURE, EXPECTORATED SPUTUM-ASSESSMENT     Status: Normal   Collection Time   09/19/12 11:30 PM      Component Value Range Status Comment   Specimen Description SPUTUM   Final    Special Requests NONE   Final    Sputum evaluation     Final     Value: THIS SPECIMEN IS ACCEPTABLE. RESPIRATORY CULTURE REPORT TO FOLLOW.   Report Status 09/20/2012 FINAL   Final   CULTURE, RESPIRATORY     Status: Normal (Preliminary result)   Collection Time   09/19/12 11:30 PM      Component Value Range Status Comment   Specimen Description SPUTUM   Final    Special Requests NONE   Final    Gram Stain     Final    Value: ABUNDANT WBC PRESENT, PREDOMINANTLY PMN     NO SQUAMOUS EPITHELIAL CELLS SEEN     NO ORGANISMS SEEN   Culture PENDING   Incomplete    Report Status PENDING   Incomplete      Studies:  Recent x-ray studies have been reviewed in detail by the Attending Physician  Scheduled Meds:  Reviewed in detail by the Attending Physician   Junious Silk, ANP [facility] Office  [phone] Pager [phone]  On-Call/Text Page:      [email or web address]      password [email or web address]  If 7PM-7AM, please contact night-coverage [email or web address] Password [email or web address] 09/20/2012, 2:03 PM   LOS: 14 days   I have personally examined this patient and reviewed the entire database. I have reviewed the above note, made any necessary editorial changes, and agree with its content.  Lonia Blood, MD [facility]

## 2012-09-20 NOTE — Progress Notes (Signed)
Occupational Therapy Treatment Patient Details Name: [name] [name] MRN: [id number] DOB: [birth year] Today's Date: 09/20/2012 Time: 1000-1028 OT Time Calculation (min): 28 min  OT Assessment / Plan / Recommendation Comments on Treatment Session Pt. very fatigued this morning.  Only able to tolerate standing for 30 seconds to 1.5 mins.  Had planned to perform grooming in standing but pt unable to tolerate.  Dyspnea 3/4.  Pt. with nose bleed - RN notified    Follow Up Recommendations  SNF    Barriers to Discharge       Equipment Recommendations  None recommended by OT;None recommended by PT    Recommendations for Other Services    Frequency Min 2X/week   Plan Discharge plan remains appropriate    Precautions / Restrictions Precautions Precautions: Fall Restrictions Weight Bearing Restrictions: No   Pertinent Vitals/Pain     ADL  Eating/Feeding: Independent (pt just finished with meal) Transfers/Ambulation Related to ADLs: Pt. moved sit to stand x 3 with mod A.  Stood for 30 seconds to 1.5 mins before fatiguing and needing to sit.  Required very long rest breaks between each attempt and dyspnea 3/4.  Pt with c/o dizziness - BP 128/58. ADL Comments: Attempted to do grooming in standing; however, pt unable today due to fatigue    OT Diagnosis:    OT Problem List:   OT Treatment Interventions:     OT Goals ADL Goals ADL Goal: Eating - Progress: Met ADL Goal: Grooming - Progress: Not progressing  Visit Information  Last OT Received On: 09/20/12 Assistance Needed: +2    Subjective Data      Prior Functioning       Cognition  Overall Cognitive Status: Appears within functional limits for tasks assessed/performed Arousal/Alertness: Awake/alert Orientation Level: Appears intact for tasks assessed Behavior During Session: [facility] for tasks performed Current Attention Level: Selective    Mobility  Shoulder Instructions Transfers Transfers: Sit to Stand;Stand to  Sit Sit to Stand: 3: Mod assist;From chair/3-in-1;With upper extremity assist Stand to Sit: 3: Mod assist;To chair/3-in-1;With upper extremity assist Details for Transfer Assistance: Pt. requires cues/assist for forward translation of trunk       Exercises      Balance Balance Balance Assessed: Yes Static Standing Balance Static Standing - Balance Support: Bilateral upper extremity supported Static Standing - Level of Assistance: 4: Min assist Static Standing - Comment/# of Minutes: 30 seconds to 1.5 mins   End of Session OT - End of Session Activity Tolerance: Patient limited by fatigue Patient left: in chair;with call bell/phone within reach Nurse Communication: Mobility status  GO     Roschelle Calandra M 09/20/2012, 10:37 AM

## 2012-09-20 NOTE — Progress Notes (Signed)
Patient: [name] [name] Date of Encounter: 09/20/2012, 8:09 AM Admit date: 09/06/2012     Subjective  [name] reports he is tired/sleepy this AM. He denies CP, SOB presently better, off of O2   Objective  Physical Exam: Vitals: BP 122/60  Pulse 96  Temp 100 F (37.8 C) (Oral)  Resp 20  Ht 6\' 5"  (1.956 m)  Wt 241 lb 6.5 oz (109.5 kg)  BMI 28.63 kg/m2  SpO2 95% General: Well developed 56 year old male in no acute distress. He appears older than his stated age. Neck: Supple. JVD not elevated. Lungs: Clear bilaterally to auscultation without wheezes, rales, or rhonchi. Breathing is unlabored. Heart: Irregularly irregular S1 S2 without murmurs, rubs, or gallops.  Abdomen: Soft, non-distended. Extremities: No clubbing or cyanosis. No edema.   Neuro: Alert. Moves all extremities spontaneously. No focal deficits.  Intake/Output:  Intake/Output Summary (Last 24 hours) at 09/20/12 0809 Last data filed at 09/20/12 0630  Gross per 24 hour  Intake    363 ml  Output   1450 ml  Net  -1087 ml    Inpatient Medications:  . [COMPLETED] ceFEPime (MAXIPIME) IV  1 g Intravenous Q8H  . diltiazem  90 mg Oral Q6H  . feeding supplement  237 mL Oral TID BM  . ferrous sulfate  325 mg Oral BID WC  . furosemide  40 mg Oral Daily  . isosorbide mononitrate  60 mg Oral Daily  . lisinopril  5 mg Oral Daily  . metoprolol tartrate  37.5 mg Oral BID  . pantoprazole  40 mg Oral Q1200  . pravastatin  40 mg Oral QPC supper  . sodium chloride  3 mL Intravenous Q12H  . [COMPLETED] vancomycin  1,000 mg Intravenous Q12H   Labs:  [facility] 09/19/12 0525 09/18/12 1017 09/18/12 0014  NA 130* 134* --  K 5.1 5.1 --  CL 99 102 --  CO2 23 26 --  GLUCOSE 121* 118* --  BUN 45* 38* --  CREATININE 1.28 1.11 --  CALCIUM 8.9 9.1 --  MG -- -- --  PHOS -- -- 3.2    Basename 09/18/12 1017 09/18/12 0014  AST 35 --  ALT 37 --  ALKPHOS 121* --  BILITOT 1.2 --  PROT 8.7* --  ALBUMIN 2.0* 1.7*     Basename 09/19/12 0525 09/18/12 0014  WBC 11.4* 11.5*  NEUTROABS -- --  HGB 7.2* 7.5*  HCT 22.3* 23.7*  MCV 84.2 85.3  PLT 251 251    Radiology/Studies: Dg Chest Port 1 View  09/18/2012  *RADIOLOGY REPORT*  Clinical Data: Follow up infiltrate  PORTABLE CHEST - 1 VIEW  Comparison: 09/15/2012  Findings: Stable cardiomegaly.  Heterogeneous opacities throughout the right lung are stable.  Left subclavian AICD device unchanged. No pneumothorax.  Minimal airspace disease at the left base has nearly resolved.  IMPRESSION: Improved airspace disease at the left base.  Stable airspace disease throughout the right lung.   Original Report Authenticated By: Jolaine Click, M.D.    Dg Chest Port 1 View  09/15/2012  *RADIOLOGY REPORT*  Clinical Data: Respiratory failure  PORTABLE CHEST - 1 VIEW  Comparison: Yesterday  Findings: Stable cardiomegaly.    Stable patchy airspace disease throughout the right lung and left base.  No pneumothorax.  Stable left subclavian AICD device.  IMPRESSION: Stable bilateral airspace disease right greater than left.   Original Report Authenticated By: Jolaine Click, M.D.     Telemetry: atrial fibrillation, rate controlled; improved rates  in last 24 hours   Assessment and Plan  1. Atrial fibrillation - rates have improved in last 24 hours; continue metoprolol and diltiazem; suspect his rates will continue to improved/remain stable as his respiratory and hematologic status improve; no anticoagulation at this time due to anemia 2. Acute on chronic systolic HF - no overt failure by exam today; I/Os negative; CXR yesterday showed improved aeration; continue medical therapy (? change BB to metoprolol succinate in setting of LV dysfunction; this BB may be better than carvedilol for rate control in setting of NICM; ? add hydralazine to nitrate regimen/Imdur in setting of NICM if BP will allow) 3. PNA - per primary team 4. Anemia - per primary team 5. Acute on chronic renal  failure  Dr. Johney Frame to see and make further recommendations Signed, EDMISTEN, BROOKE PA-C  I have seen, examined the patient, and reviewed the above assessment and plan.  Changes to above are made where necessary.  Pt is making slow clinical improvement.  Afib with RVR is better.  SOB is better.  Anemia continues to be concerning- per primary team.  Co Sign: Hillis Range, MD 09/20/2012 5:37 PM

## 2012-09-21 LAB — BASIC METABOLIC PANEL
CO2: 22 mEq/L (ref 19–32)
Calcium: 8.7 mg/dL (ref 8.4–10.5)
Creatinine, Ser: 1.68 mg/dL — ABNORMAL HIGH (ref 0.50–1.35)
GFR calc Af Amer: 52 mL/min — ABNORMAL LOW (ref >90)
GFR calc non Af Amer: 45 mL/min — ABNORMAL LOW (ref >90)
Sodium: 133 mEq/L — ABNORMAL LOW (ref 135–145)

## 2012-09-21 LAB — PREPARE RBC (CROSSMATCH)

## 2012-09-21 LAB — RETICULOCYTES
RBC.: 2.3 MIL/uL — ABNORMAL LOW (ref 4.22–5.81)
Retic Count, Absolute: 18.4 10*3/uL — ABNORMAL LOW (ref 19.0–186.0)

## 2012-09-21 LAB — CBC
HCT: 19.5 % — ABNORMAL LOW (ref 39.0–52.0)
Hemoglobin: 6.4 g/dL — CL (ref 13.0–17.0)
Hemoglobin: 7.8 g/dL — ABNORMAL LOW (ref 13.0–17.0)
MCH: 27.9 pg (ref 26.0–34.0)
MCH: 27.3 pg (ref 26.0–34.0)
MCHC: 32.8 g/dL (ref 30.0–36.0)
Platelets: 228 10*3/uL (ref 150–400)
RBC: 2.86 MIL/uL — ABNORMAL LOW (ref 4.22–5.81)
WBC: 9.6 10*3/uL (ref 4.0–10.5)

## 2012-09-21 LAB — IRON AND TIBC: Iron: 13 ug/dL — ABNORMAL LOW (ref 42–135)

## 2012-09-21 MED ORDER — FUROSEMIDE 10 MG/ML IJ SOLN
20.0000 mg | INTRAMUSCULAR | Status: AC
  Administered 2012-09-21: 20 mg via INTRAVENOUS

## 2012-09-21 MED ORDER — FUROSEMIDE 10 MG/ML IJ SOLN
INTRAMUSCULAR | Status: AC
  Filled 2012-09-21: qty 4

## 2012-09-21 NOTE — Progress Notes (Signed)
Speech Language Pathology Dysphagia Treatment Patient Details Name: [name] [name] MRN: [id number] DOB: 1956-03-18 Today's Date: 09/21/2012 Time: 1540-1550 SLP Time Calculation (min): 10 min  Assessment / Plan / Recommendation Clinical Impression  F/u for swallowing.  Pt now on room air; respirations 20.  Sitting in recliner, alert, communicative.  Consumed graham crackers despite lacking dentition, without difficulty.  Instructed to swallow multiple, successive boluses of water in an effort to tax the biomechanics of swallow and determine pt's ability to protect his airway.  Overall function is much improved and swallow function appears to have returned to baseline after acute medical event.  Recommend continuing mech soft/dysphagia 3 diet.  No further SLP needs identified/ no f/u warranted.  Pt in agreement.     Diet Recommendation  Continue with Current Diet: Dysphagia 3 (mechanical soft);Thin liquid    SLP Plan   goals met/ no f/u  Pertinent Vitals/Pain no pain   Swallowing Goals  SLP Swallowing Goals Patient will consume recommended diet without observed clinical signs of aspiration with: Minimal assistance Swallow Study Goal #1 - Progress: Met Patient will utilize recommended strategies during swallow to increase swallowing safety with: Minimal assistance Swallow Study Goal #2 - Progress: Met  General Temperature Spikes Noted: No Respiratory Status: Room air Behavior/Cognition: Alert;Pleasant mood;Cooperative Oral Cavity - Dentition: Edentulous Patient Positioning: Upright in chair  Oral Cavity - Oral Hygiene Does patient have any of the following "at risk" factors?: None of the above   Dysphagia Treatment Treatment focused on: Skilled observation of diet tolerance;Patient/family/caregiver education Treatment Methods/Modalities: Skilled observation Patient observed directly with PO's: Yes Type of PO's observed: Dysphagia 3 (soft);Thin liquids Feeding: Able to feed  self Liquids provided via: Cup Type of cueing:  (none)   GO   Kinaya Hilliker L. Samson Frederic, [place] CCC/SLP Pager [phone]   Blenda Mounts Laurice 09/21/2012, 3:58 PM

## 2012-09-21 NOTE — Progress Notes (Signed)
CRITICAL VALUE ALERT  Critical value received:  hgb 6.4  Date of notification:  09/21/12  Time of notification:  0610  Critical value read back:yes  Nurse who received alert:  France Ravens  MD notified (1st page):  Maren Reamer  Time of first page:  0615  MD notified (2nd page):  Time of second page:  Responding MD:  Maren Reamer  Time MD responded:  [phone]

## 2012-09-21 NOTE — Progress Notes (Signed)
Patient: [name] [name] Date of Encounter: 09/21/2012, 7:37 AM Admit date: 09/06/2012     Subjective  Patient denies chest pain or dyspnea.   Objective  Physical Exam: Vitals: BP 133/70  Pulse 85  Temp 97.6 F (36.4 C) (Oral)  Resp 20  Ht 6\' 5"  (1.956 m)  Wt 241 lb 6.5 oz (109.5 kg)  BMI 28.63 kg/m2  SpO2 98% General: Well developed 56 year old male in no acute distress.  Neck: Supple.  Lungs: Mild basilar crackles Heart: Irregularly irregular S1 S2 without murmurs, rubs, or gallops.  Abdomen: Soft, non-distended. Extremities: No clubbing or cyanosis. Trace to 1+ ankle edema.   Neuro: Alert. Moves all extremities spontaneously. No focal deficits.  Intake/Output:  Intake/Output Summary (Last 24 hours) at 09/21/12 0737 Last data filed at 09/21/12 0456  Gross per 24 hour  Intake      0 ml  Output    401 ml  Net   -401 ml    Inpatient Medications:  . [COMPLETED] ceFEPime (MAXIPIME) IV  1 g Intravenous Q8H  . diltiazem  90 mg Oral Q6H  . feeding supplement  237 mL Oral TID BM  . ferrous sulfate  325 mg Oral BID WC  . furosemide  40 mg Oral Daily  . isosorbide mononitrate  60 mg Oral Daily  . lisinopril  5 mg Oral Daily  . metoprolol tartrate  37.5 mg Oral BID  . pantoprazole  40 mg Oral Q1200  . pravastatin  40 mg Oral QPC supper  . sodium chloride  3 mL Intravenous Q12H  . [COMPLETED] vancomycin  1,000 mg Intravenous Q12H   Labs:  [facility] 09/21/12 0430 09/19/12 0525  NA 133* 130*  K 4.8 5.1  CL 102 99  CO2 22 23  GLUCOSE 115* 121*  BUN 48* 45*  CREATININE 1.68* 1.28  CALCIUM 8.7 8.9  MG -- --  PHOS -- --    Basename 09/18/12 1017  AST 35  ALT 37  ALKPHOS 121*  BILITOT 1.2  PROT 8.7*  ALBUMIN 2.0*    Basename 09/21/12 0430 09/19/12 0525  WBC 8.5 11.4*  NEUTROABS -- --  HGB 6.4* 7.2*  HCT 19.5* 22.3*  MCV 85.2 84.2  PLT 196 251    Radiology/Studies: Dg Chest Port 1 View  09/18/2012  *RADIOLOGY REPORT*  Clinical Data: Follow up  infiltrate  PORTABLE CHEST - 1 VIEW  Comparison: 09/15/2012  Findings: Stable cardiomegaly.  Heterogeneous opacities throughout the right lung are stable.  Left subclavian AICD device unchanged. No pneumothorax.  Minimal airspace disease at the left base has nearly resolved.  IMPRESSION: Improved airspace disease at the left base.  Stable airspace disease throughout the right lung.   Original Report Authenticated By: Jolaine Click, M.D.    Dg Chest Port 1 View  09/15/2012  *RADIOLOGY REPORT*  Clinical Data: Respiratory failure  PORTABLE CHEST - 1 VIEW  Comparison: Yesterday  Findings: Stable cardiomegaly.    Stable patchy airspace disease throughout the right lung and left base.  No pneumothorax.  Stable left subclavian AICD device.  IMPRESSION: Stable bilateral airspace disease right greater than left.   Original Report Authenticated By: Jolaine Click, M.D.     Telemetry: atrial fibrillation, rate controlled; improved rates in last 24 hours   Assessment and Plan  1. Atrial fibrillation - rates controlled; continue metoprolol and diltiazem; suspect his rates will continue to improved/remain stable as his respiratory and hematologic status improve; no anticoagulation at this time due  to anemia 2. Acute on chronic systolic HF - Mild pedal edema on exam; continue present dose of lasix ; watch renal function. 3. PNA - per primary team 4. Anemia - per primary team 5. Acute on chronic renal failure   Olga Millers MD 7:44 AM

## 2012-09-21 NOTE — Progress Notes (Signed)
[facility] PROGRESS NOTE  AJIT YOHEY ZOX:[id number] DOB: 1956/09/02 DOA: 09/06/2012 PCP: [name], MD  Assessment/Plan: Active Problems:  Atrial fibrillation with RVR  Chronic systolic CHF (congestive heart failure), NYHA class 3  IMPLANTATION OF DEFIBRILLATOR, HX OF  Iron deficiency anemia, unspecified  CAP (community acquired pneumonia)  Cardiomyopathy, nonischemic  Hypertension  Acute respiratory failure with hypoxia  Warfarin-induced coagulopathy  Mild epistaxis  CKD (chronic kidney disease) stage 2, GFR 60-89 ml/min  Acute renal failure  Thrombocytopenia     1. Severe community-acquired pneumonia/Acute hypoxic respiratory failure:  Patient presented with chills, and a productive cough. CXR demonstrated bilateral patchy airspace opacities, right greater than left, and he had a leukocytosis of 12.0. He was managed initially, with iv rocephin/Azithromycin, but airspace disease progressed, he continued to spike pyrexia, and on 09/09/12, spiked as high as 103.5 degrees Farenheit, with a tachypnea of 30-40/min and Oxygen saturation in the 80s. Antibiotics were broadened to include Vancomycin and Cefepime, while Rocephin was discontinued. He was transferred to ICU on that date. Clinically, he has had a rather turbulennt clinical course, but now appears to have turned the corner. Follow up CXR on 09/18/2012 was without significant improvement, but clinically, pneumonia has improved greatly, wcc has normalized today at 8.5, and he is now saturating at 98%-100% off oxygen. He did have an episode of aspiration/Pneumonitis on 09/14/2012, related to episode of epistaxis, but this has since resolved. SLP has cleared for diet and patient is tolerating a mechanical soft diet.  2. Chronic systolic congestive heart failure/Non-ischemic cardiomyopathy:  Patient has a known history of chronic systolic CHF, nonischemic cardiomyopathy, cardiac arrest (1998), s/p AICD. He initially  appeared compensated on admission, but became acutely dyspneic, on 09/07/12, after completion of blood transfusion. CXR confirmed volume overload, and patient was managed aggressively with iv Lasix. Rothbart provided cardiology consultation and ICD check was carried out by Dr Hillis Range, EPS, on 09/08/12. ICD interrogation was reviewed and declared normal. Cardiology team has continued to follow and have been very helpful in managing decompensated heart failure,in this complicated patient. As of 09/21/12, he appears clinically compensated.  3. Atrial fibrillation:   On 09/08/12, patient went into fast atrial fibrillation, with HR in the 130s-140s. He was started on iv Cardizem, and iv Amiodarone was  Added on 09/09/12, because of unremitting tachycardia, after consultation with Cardiologist. Over the course of hospitalization, HR has improved, and rate-control has now been achieved with oral Cardizem and Lopressor. Managing per cardiology.  4. Hypertension:  BP is reasonably well controlled at this time, on diuretics, ACE-i, Cardizem and Lopressor.   5. Hyperlipidemia: On statin.   6. Acute on chronic renal failure:   Baseline creatinine 1.2. Renal function appears stable at this time.  7. Anemia of chronic disease/ acute blood loss anemia due to epistaxis:   Severe, normocytic. Patient presented with a hemoglobin of 7.5, denied black stools, hematochezia or hematemesis. FOBT was negative, although hematinic studies have confirmed iron deficiency. He was transfused 2 units PRBC on 09/07/12, with bump in HB to 8.4, and was placed on empiric Protonix. Dr Jeani Hawking provided GI consultation, and has opined that patient is high risk for EGD/Colonoscopy, given his comorbidities. Anticoagulation was placed on hold, against a background of supra-therapeutic INR. 5 mg Vit K was given iv on 09/07/12, and INR has since drifted down. An attempt to restart anticoagulation, resulted in epistaxis, with further  worsening of anemia. He has had a total of 6 units PRBC so far,  with the last 2, administered today, for a hemoglobin of 6.4. Per Dr Dietrich Pates, patient is at substantial risk for thrombembolism, so the matter has to be revisited. I still think a GI evaluation is would be helpful if feasible, and will explore this option.  8. Warfarin-induced Coagulopathy:  See above discussion.  9. Suspected sleep apnea:   Patient may benefit from outpatient sleep study.     Code Status: Full Code.  Family Communication:  Disposition Plan: To be determined.    Brief narrative: Brief narrative:  56 year old patient with long-standing chronic severe systolic dysfunction with an EF of around 35%. He presented on 09/06/2012 with complaints of dyspnea, cough with productive sputum, fevers and chills felt to be related to pneumonia. He was also found to be anemic. Previous hemoglobin in 2008 was 13 and was down to just above 8. He had persistent fevers and worsening respiratory status after admission so was transferred to the ICU on 09/09/2012. PCCM assumed care of the patient because of respiratory difficulties. Since admission patient developed issues with atrial fibrillation and RVR that required cardiology consultation and the patient rate had been stabilized on diltiazem drip and he had subsequently been converted to oral Cardizem. He also continued to have respiratory problems that were associated with superimposed heart failure presumed secondary to stressors of chronic anemia and pneumonia. Because of this cardiology has started the patient on a milrinone infusion. [facility] reassumed care of this patient on 09/14/2012 but he once again decompensated from a respiratory standpoint and required transfer back to the ICU on the same date. It was felt his respiratory symptoms were related to aspiration from recent severe epistaxis. He subsequently restarted her eyes to it was deemed once again appropriate to transfer  to the step down unit.   Consultants:  Dr Stratton Bing, cardiologist.  Dr Jeani Hawking, GI.  Dr Wynelle Cleveland Roma, PCCM.   Procedures:  Serial CXRs.   Antibiotics:  Cefepime 09/10/12>>>  Vancomycin 09/10/12-09/14/12  HPI/Subjective: No new issues.   Objective: Vital signs in last 24 hours: Temp:  [97.6 F (36.4 C)-101.4 F (38.6 C)] 97.6 F (36.4 C) (11/07 0456) Pulse Rate:  [84-105] 85  (11/07 0456) Resp:  [18-20] 20  (11/07 0456) BP: (109-134)/(53-70) 133/70 mmHg (11/07 0456) SpO2:  [95 %-98 %] 98 % (11/07 0456) Weight change:  Last BM Date: 09/20/12  Intake/Output from previous day: 11/06 0701 - 11/07 0700 In: -  Out: 401 [Urine:400; Stool:1]     Physical Exam: General: Comfortable in chair, alert, communicative, fully oriented, not short of breath at rest.  HEENT: Moderate clinical pallor, no jaundice, no conjunctival injection or discharge.  NECK: Supple, JVP not seen, no carotid bruits, no palpable lymphadenopathy, no palpable goiter.  CHEST: Clinically clear to auscultation.  HEART: Sounds 1 and 2 heard, normal, irregular, no murmurs.  ABDOMEN: Full, soft, no scars, non-tender, no palpable organomegaly, no palpable masses, normal bowel sounds.  GENITALIA: Not examined.  LOWER EXTREMITIES: No pitting edema, palpable peripheral pulses.  MUSCULOSKELETAL SYSTEM: Unremarkable.  CENTRAL NERVOUS SYSTEM: No focal neurologic deficit on gross examination.  Lab Results:  [facility] 09/21/12 0430 09/19/12 0525  WBC 8.5 11.4*  HGB 6.4* 7.2*  HCT 19.5* 22.3*  PLT 196 251    Basename 09/21/12 0430 09/19/12 0525  NA 133* 130*  K 4.8 5.1  CL 102 99  CO2 22 23  GLUCOSE 115* 121*  BUN 48* 45*  CREATININE 1.68* 1.28  CALCIUM 8.7 8.9   Recent Results (  from the past 240 hour(s))  CULTURE, BLOOD (ROUTINE X 2)     Status: Normal   Collection Time   09/14/12  4:00 PM      Component Value Range Status Comment   Specimen Description BLOOD RIGHT HAND   Final      Special Requests BOTTLES DRAWN AEROBIC AND ANAEROBIC 10CC   Final    Culture  Setup Time 09/14/2012 22:39   Final    Culture NO GROWTH 5 DAYS   Final    Report Status 09/20/2012 FINAL   Final   CULTURE, BLOOD (ROUTINE X 2)     Status: Normal   Collection Time   09/14/12  4:06 PM      Component Value Range Status Comment   Specimen Description BLOOD LEFT ARM   Final    Special Requests BOTTLES DRAWN AEROBIC AND ANAEROBIC 10CC   Final    Culture  Setup Time 09/14/2012 22:39   Final    Culture NO GROWTH 5 DAYS   Final    Report Status 09/20/2012 FINAL   Final   URINE CULTURE     Status: Normal   Collection Time   09/14/12  9:31 PM      Component Value Range Status Comment   Specimen Description URINE, CLEAN CATCH   Final    Special Requests NONE   Final    Culture  Setup Time 09/14/2012 22:35   Final    Colony Count NO GROWTH   Final    Culture NO GROWTH   Final    Report Status 09/16/2012 FINAL   Final   CULTURE, EXPECTORATED SPUTUM-ASSESSMENT     Status: Normal   Collection Time   09/19/12 11:30 PM      Component Value Range Status Comment   Specimen Description SPUTUM   Final    Special Requests NONE   Final    Sputum evaluation     Final    Value: THIS SPECIMEN IS ACCEPTABLE. RESPIRATORY CULTURE REPORT TO FOLLOW.   Report Status 09/20/2012 FINAL   Final   CULTURE, RESPIRATORY     Status: Normal (Preliminary result)   Collection Time   09/19/12 11:30 PM      Component Value Range Status Comment   Specimen Description SPUTUM   Final    Special Requests NONE   Final    Gram Stain     Final    Value: ABUNDANT WBC PRESENT, PREDOMINANTLY PMN     NO SQUAMOUS EPITHELIAL CELLS SEEN     NO ORGANISMS SEEN   Culture PENDING   Incomplete    Report Status PENDING   Incomplete      Studies/Results: No results found.  Medications: Scheduled Meds:   . diltiazem  90 mg Oral Q6H  . feeding supplement  237 mL Oral Q24H  . ferrous sulfate  325 mg Oral BID WC  . furosemide  40 mg  Oral Daily  . isosorbide mononitrate  60 mg Oral Daily  . lisinopril  5 mg Oral Daily  . metoprolol tartrate  37.5 mg Oral BID  . pantoprazole  40 mg Oral Q1200  . pravastatin  40 mg Oral QPC supper  . sodium chloride  3 mL Intravenous Q12H  . [DISCONTINUED] feeding supplement  237 mL Oral TID BM   Continuous Infusions:  PRN Meds:.sodium chloride, acetaminophen, ibuprofen, levalbuterol, oxyCODONE, sodium chloride    LOS: 15 days   Rubylee Zamarripa,CHRISTOPHER  [facility] Pager [phone]. If 8PM-8AM, please contact night-coverage  at [email or web address], password [facility] 09/21/2012, 7:23 AM  LOS: 15 days

## 2012-09-22 LAB — TYPE AND SCREEN
ABO/RH(D): O POS
Antibody Screen: NEGATIVE
Unit division: 0

## 2012-09-22 LAB — CBC
HCT: 24.2 % — ABNORMAL LOW (ref 39.0–52.0)
MCH: 26.8 pg (ref 26.0–34.0)
MCV: 83.2 fL (ref 78.0–100.0)
Platelets: 220 10*3/uL (ref 150–400)
RDW: 16.1 % — ABNORMAL HIGH (ref 11.5–15.5)
WBC: 8.7 10*3/uL (ref 4.0–10.5)

## 2012-09-22 LAB — COMPREHENSIVE METABOLIC PANEL
AST: 50 U/L — ABNORMAL HIGH (ref 0–37)
Albumin: 2 g/dL — ABNORMAL LOW (ref 3.5–5.2)
BUN: 42 mg/dL — ABNORMAL HIGH (ref 6–23)
CO2: 23 mEq/L (ref 19–32)
Calcium: 8.8 mg/dL (ref 8.4–10.5)
Chloride: 102 mEq/L (ref 96–112)
Creatinine, Ser: 1.48 mg/dL — ABNORMAL HIGH (ref 0.50–1.35)
GFR calc non Af Amer: 52 mL/min — ABNORMAL LOW (ref >90)
Total Bilirubin: 1 mg/dL (ref 0.3–1.2)

## 2012-09-22 LAB — CULTURE, RESPIRATORY W GRAM STAIN

## 2012-09-22 MED ORDER — DILTIAZEM HCL ER COATED BEADS 240 MG PO CP24
240.0000 mg | ORAL_CAPSULE | Freq: Every day | ORAL | Status: DC
  Administered 2012-09-22 – 2012-09-24 (×3): 240 mg via ORAL
  Filled 2012-09-22 (×4): qty 1

## 2012-09-22 MED ORDER — CARVEDILOL 12.5 MG PO TABS
12.5000 mg | ORAL_TABLET | Freq: Two times a day (BID) | ORAL | Status: DC
  Administered 2012-09-22 – 2012-09-29 (×14): 12.5 mg via ORAL
  Filled 2012-09-22 (×16): qty 1

## 2012-09-22 NOTE — Progress Notes (Signed)
[facility] PROGRESS NOTE  [name] [name] ZOX:[id number] DOB: 1956-02-04 DOA: 09/06/2012 PCP: [name], MD  Assessment/Plan: Active Problems:  Atrial fibrillation with RVR  Chronic systolic CHF (congestive heart failure), NYHA class 3  IMPLANTATION OF DEFIBRILLATOR, HX OF  Iron deficiency anemia, unspecified  CAP (community acquired pneumonia)  Cardiomyopathy, nonischemic  Hypertension  Acute respiratory failure with hypoxia  Warfarin-induced coagulopathy  Mild epistaxis  CKD (chronic kidney disease) stage 2, GFR 60-89 ml/min  Acute renal failure  Thrombocytopenia     1. Severe community-acquired pneumonia/Acute hypoxic respiratory failure:  Patient presented with chills, and a productive cough. CXR demonstrated bilateral patchy airspace opacities, right greater than left, and he had a leukocytosis of 12.0. He was managed initially, with iv rocephin/Azithromycin, but airspace disease progressed, he continued to spike pyrexia, and on 09/09/12, spiked as high as 103.5 degrees Farenheit, with a tachypnea of 30-40/min and Oxygen saturation in the 80s. Antibiotics were broadened to include Vancomycin and Cefepime, while Rocephin was discontinued. He was transferred to ICU on that date. Clinically, he has had a rather turbulent clinical course, but now appears to have turned the corner. Follow up CXR on 09/18/2012 was without significant improvement, but clinically, pneumonia has improved greatly, wcc has normalized today at 8.5, and he is now saturating at 98%-100% off oxygen. He did have an episode of aspiration/Pneumonitis on 09/14/2012, related to episode of epistaxis, but this has since resolved. SLP has cleared for diet and patient is tolerating a mechanical soft diet. Will recheck CXR on 09/23/12.  2. Chronic systolic congestive heart failure/Non-ischemic cardiomyopathy:  Patient has a known history of chronic systolic CHF, nonischemic cardiomyopathy, cardiac arrest (1998),  s/p AICD. He initially appeared compensated on admission, but became acutely dyspneic, on 09/07/12, after completion of blood transfusion. CXR confirmed volume overload, and patient was managed aggressively with iv Lasix. Rothbart provided cardiology consultation and ICD check was carried out by Dr Hillis Range, EPS, on 09/08/12. ICD interrogation was reviewed and declared normal. Cardiology team has continued to follow and have been very helpful in managing decompensated heart failure,in this complicated patient. As of 09/21/12, he appears clinically better compensated.  3. Atrial fibrillation:   On 09/08/12, patient went into fast atrial fibrillation, with HR in the 130s-140s. He was started on iv Cardizem, and iv Amiodarone was  Added on 09/09/12, because of unremitting tachycardia, after consultation with Cardiologist. Over the course of hospitalization, HR has improved, and rate-control has now been achieved with oral Cardizem and Lopressor. Cardiologist has changed to beta-blocker to Coreg today.  4. Hypertension:  BP is reasonably well controlled at this time, on diuretics, ACE-i, Cardizem and beta-blocker.   5. Hyperlipidemia: On statin.   6. Acute on chronic renal failure:   Baseline creatinine 1.2. Renal function appears stable at this time.  7. Anemia of chronic disease/ acute blood loss anemia due to epistaxis:   Severe, normocytic. Patient presented with a hemoglobin of 7.5, denied black stools, hematochezia or hematemesis. FOBT was negative, although hematinic studies have confirmed iron deficiency. He was transfused 2 units PRBC on 09/07/12, with bump in HB to 8.4, and was placed on empiric Protonix. Dr Jeani Hawking provided GI consultation, and has opined that patient is high risk for EGD/Colonoscopy, given his comorbidities. Anticoagulation was placed on hold, against a background of supra-therapeutic INR. 5 mg Vit K was given iv on 09/07/12, and INR has since drifted down. An attempt to  restart anticoagulation, resulted in epistaxis, with further worsening of anemia.  He has had a total of 6 units PRBC so far, with the last 2, administered today, for a hemoglobin of 6.4. Per Dr Dietrich Pates, patient is at substantial risk for thrombembolism, so the matter has to be revisited. A GI evaluation is would be helpful if feasible, and will explore this option, when patient is clinically more stable. Following CBC.  8. Warfarin-induced Coagulopathy:  See above discussion.  9. Suspected sleep apnea:   Patient may benefit from outpatient sleep study.     Code Status: Full Code.  Family Communication:  Disposition Plan: To be determined.    Brief narrative: Brief narrative:  56 year old patient with long-standing chronic severe systolic dysfunction with an EF of around 35%. He presented on 09/06/2012 with complaints of dyspnea, cough with productive sputum, fevers and chills felt to be related to pneumonia. He was also found to be anemic. Previous hemoglobin in 2008 was 13 and was down to just above 8. He had persistent fevers and worsening respiratory status after admission so was transferred to the ICU on 09/09/2012. PCCM assumed care of the patient because of respiratory difficulties. Since admission patient developed issues with atrial fibrillation and RVR that required cardiology consultation and the patient rate had been stabilized on diltiazem drip and he had subsequently been converted to oral Cardizem. He also continued to have respiratory problems that were associated with superimposed heart failure presumed secondary to stressors of chronic anemia and pneumonia. Because of this cardiology has started the patient on a milrinone infusion. [facility] reassumed care of this patient on 09/14/2012 but he once again decompensated from a respiratory standpoint and required transfer back to the ICU on the same date. It was felt his respiratory symptoms were related to aspiration from recent  severe epistaxis. He subsequently restarted her eyes to it was deemed once again appropriate to transfer to the step down unit.   Consultants:  Dr [place] Bing, cardiologist.  Dr Jeani Hawking, GI.  Dr Wynelle Cleveland Roma, PCCM.   Procedures:  Serial CXRs.   Antibiotics:  Cefepime 09/10/12>>>  Vancomycin 09/10/12-09/14/12  HPI/Subjective: No new issues.   Objective: Vital signs in last 24 hours: Temp:  [98.2 F (36.8 C)-98.8 F (37.1 C)] 98.6 F (37 C) (11/08 1315) Pulse Rate:  [69-110] 110  (11/08 1718) Resp:  [18] 18  (11/08 0602) BP: (117-153)/(66-78) 153/78 mmHg (11/08 1718) SpO2:  [100 %] 100 % (11/08 1315) Weight:  [111.2 kg (245 lb 2.4 oz)] 111.2 kg (245 lb 2.4 oz) (11/08 0602) Weight change:  Last BM Date: 09/21/12  Intake/Output from previous day: 11/07 0701 - 11/08 0700 In: 1905 [P.O.:720; I.V.:500; Blood:685] Out: 1225 [Urine:1225] Total I/O In: 480 [P.O.:480] Out: 600 [Urine:600]   Physical Exam: General: Alert, communicative, fully oriented, not short of breath at rest.  HEENT: Moderate clinical pallor, no jaundice, no conjunctival injection or discharge.  NECK: Supple, JVP not seen, no carotid bruits, no palpable lymphadenopathy, no palpable goiter.  CHEST: Clinically clear to auscultation.  HEART: Sounds 1 and 2 heard, normal, irregular, no murmurs.  ABDOMEN: Full, soft, no scars, non-tender, no palpable organomegaly, no palpable masses, normal bowel sounds.  GENITALIA: Not examined.  LOWER EXTREMITIES: No pitting edema, palpable peripheral pulses.  MUSCULOSKELETAL SYSTEM: Unremarkable.  CENTRAL NERVOUS SYSTEM: No focal neurologic deficit on gross examination.  Lab Results:  [facility] 09/22/12 0455 09/21/12 2135  WBC 8.7 9.6  HGB 7.8* 7.8*  HCT 24.2* 23.7*  PLT 220 228    Basename 09/22/12 0455 09/21/12 0430  NA 135 133*  K 4.7 4.8  CL 102 102  CO2 23 22  GLUCOSE 122* 115*  BUN 42* 48*  CREATININE 1.48* 1.68*  CALCIUM 8.8 8.7    Recent Results (from the past 240 hour(s))  CULTURE, BLOOD (ROUTINE X 2)     Status: Normal   Collection Time   09/14/12  4:00 PM      Component Value Range Status Comment   Specimen Description BLOOD RIGHT HAND   Final    Special Requests BOTTLES DRAWN AEROBIC AND ANAEROBIC 10CC   Final    Culture  Setup Time 09/14/2012 22:39   Final    Culture NO GROWTH 5 DAYS   Final    Report Status 09/20/2012 FINAL   Final   CULTURE, BLOOD (ROUTINE X 2)     Status: Normal   Collection Time   09/14/12  4:06 PM      Component Value Range Status Comment   Specimen Description BLOOD LEFT ARM   Final    Special Requests BOTTLES DRAWN AEROBIC AND ANAEROBIC 10CC   Final    Culture  Setup Time 09/14/2012 22:39   Final    Culture NO GROWTH 5 DAYS   Final    Report Status 09/20/2012 FINAL   Final   URINE CULTURE     Status: Normal   Collection Time   09/14/12  9:31 PM      Component Value Range Status Comment   Specimen Description URINE, CLEAN CATCH   Final    Special Requests NONE   Final    Culture  Setup Time 09/14/2012 22:35   Final    Colony Count NO GROWTH   Final    Culture NO GROWTH   Final    Report Status 09/16/2012 FINAL   Final   CULTURE, EXPECTORATED SPUTUM-ASSESSMENT     Status: Normal   Collection Time   09/19/12 11:30 PM      Component Value Range Status Comment   Specimen Description SPUTUM   Final    Special Requests NONE   Final    Sputum evaluation     Final    Value: THIS SPECIMEN IS ACCEPTABLE. RESPIRATORY CULTURE REPORT TO FOLLOW.   Report Status 09/20/2012 FINAL   Final   CULTURE, RESPIRATORY     Status: Normal   Collection Time   09/19/12 11:30 PM      Component Value Range Status Comment   Specimen Description SPUTUM   Final    Special Requests NONE   Final    Gram Stain     Final    Value: ABUNDANT WBC PRESENT, PREDOMINANTLY PMN     NO SQUAMOUS EPITHELIAL CELLS SEEN     NO ORGANISMS SEEN   Culture FEW CANDIDA ALBICANS   Final    Report Status 09/22/2012 FINAL    Final   CULTURE, BLOOD (ROUTINE X 2)     Status: Normal (Preliminary result)   Collection Time   09/20/12  4:58 PM      Component Value Range Status Comment   Specimen Description BLOOD LEFT HAND   Final    Special Requests BOTTLES DRAWN AEROBIC ONLY 5CC   Final    Culture  Setup Time 09/20/2012 23:51   Final    Culture     Final    Value:        BLOOD CULTURE RECEIVED NO GROWTH TO DATE CULTURE WILL BE HELD FOR 5 DAYS BEFORE ISSUING A FINAL NEGATIVE REPORT  Report Status PENDING   Incomplete   CULTURE, BLOOD (ROUTINE X 2)     Status: Normal (Preliminary result)   Collection Time   09/20/12  5:05 PM      Component Value Range Status Comment   Specimen Description BLOOD RIGHT ARM   Final    Special Requests BOTTLES DRAWN AEROBIC AND ANAEROBIC 10CC   Final    Culture  Setup Time 09/20/2012 23:50   Final    Culture     Final    Value:        BLOOD CULTURE RECEIVED NO GROWTH TO DATE CULTURE WILL BE HELD FOR 5 DAYS BEFORE ISSUING A FINAL NEGATIVE REPORT   Report Status PENDING   Incomplete      Studies/Results: No results found.  Medications: Scheduled Meds:    . carvedilol  12.5 mg Oral BID WC  . diltiazem  240 mg Oral Daily  . feeding supplement  237 mL Oral Q24H  . ferrous sulfate  325 mg Oral BID WC  . furosemide  40 mg Oral Daily  . isosorbide mononitrate  60 mg Oral Daily  . lisinopril  5 mg Oral Daily  . pantoprazole  40 mg Oral Q1200  . pravastatin  40 mg Oral QPC supper  . sodium chloride  3 mL Intravenous Q12H  . [DISCONTINUED] diltiazem  90 mg Oral Q6H  . [DISCONTINUED] metoprolol tartrate  37.5 mg Oral BID   Continuous Infusions:  PRN Meds:.sodium chloride, acetaminophen, ibuprofen, levalbuterol, oxyCODONE, sodium chloride    LOS: 16 days   Ramesh Moan,CHRISTOPHER  [facility] Pager [phone]. If 8PM-8AM, please contact night-coverage at [email or web address], password [facility] 09/22/2012, 6:52 PM  LOS: 16 days

## 2012-09-22 NOTE — Progress Notes (Addendum)
Patient: [name] [name] Date of Encounter: 09/22/2012, 7:31 AM Admit date: 09/06/2012     Subjective  Patient denies chest pain or dyspnea.   Objective  Physical Exam: Vitals: BP 136/70  Pulse 81  Temp 98.8 F (37.1 C) (Oral)  Resp 18  Ht 6\' 5"  (1.956 m)  Wt 245 lb 2.4 oz (111.2 kg)  BMI 29.07 kg/m2  SpO2 100% General: Well developed 56 year old male in no acute distress.  OP clear Neck: Supple.  Lungs: Mild basilar crackles Heart: Irregularly irregular S1 S2 without murmurs, rubs, or gallops.  Abdomen: Soft, non-distended. Extremities: No clubbing or cyanosis. Trace to 1+ ankle edema.   Neuro: Alert. Moves all extremities spontaneously. No focal deficits.  Intake/Output:  Intake/Output Summary (Last 24 hours) at 09/22/12 0731 Last data filed at 09/22/12 0605  Gross per 24 hour  Intake   1665 ml  Output    875 ml  Net    790 ml    Inpatient Medications:  . [COMPLETED] ceFEPime (MAXIPIME) IV  1 g Intravenous Q8H  . diltiazem  90 mg Oral Q6H  . feeding supplement  237 mL Oral TID BM  . ferrous sulfate  325 mg Oral BID WC  . furosemide  40 mg Oral Daily  . isosorbide mononitrate  60 mg Oral Daily  . lisinopril  5 mg Oral Daily  . metoprolol tartrate  37.5 mg Oral BID  . pantoprazole  40 mg Oral Q1200  . pravastatin  40 mg Oral QPC supper  . sodium chloride  3 mL Intravenous Q12H  . [COMPLETED] vancomycin  1,000 mg Intravenous Q12H   Labs:  [facility] 09/22/12 0455 09/21/12 0430  NA 135 133*  K 4.7 4.8  CL 102 102  CO2 23 22  GLUCOSE 122* 115*  BUN 42* 48*  CREATININE 1.48* 1.68*  CALCIUM 8.8 8.7  MG -- --  PHOS -- --    Basename 09/22/12 0455  AST 50*  ALT 46  ALKPHOS 124*  BILITOT 1.0  PROT 8.3  ALBUMIN 2.0*    Basename 09/22/12 0455 09/21/12 2135  WBC 8.7 9.6  NEUTROABS -- --  HGB 7.8* 7.8*  HCT 24.2* 23.7*  MCV 83.2 82.9  PLT 220 228    Radiology/Studies: Dg Chest Port 1 View  09/18/2012  *RADIOLOGY REPORT*  Clinical Data:  Follow up infiltrate  PORTABLE CHEST - 1 VIEW  Comparison: 09/15/2012  Findings: Stable cardiomegaly.  Heterogeneous opacities throughout the right lung are stable.  Left subclavian AICD device unchanged. No pneumothorax.  Minimal airspace disease at the left base has nearly resolved.  IMPRESSION: Improved airspace disease at the left base.  Stable airspace disease throughout the right lung.   Original Report Authenticated By: Jolaine Click, M.D.    Dg Chest Port 1 View  09/15/2012  *RADIOLOGY REPORT*  Clinical Data: Respiratory failure  PORTABLE CHEST - 1 VIEW  Comparison: Yesterday  Findings: Stable cardiomegaly.    Stable patchy airspace disease throughout the right lung and left base.  No pneumothorax.  Stable left subclavian AICD device.  IMPRESSION: Stable bilateral airspace disease right greater than left.   Original Report Authenticated By: Jolaine Click, M.D.     Telemetry: atrial fibrillation, rate controlled; improved rates in last 24 hours   Assessment and Plan  1. Atrial fibrillation - rates controlled; continue metoprolol and diltiazem; suspect his rates will continue to improved/remain stable as his respiratory and hematologic status improve; no anticoagulation at this time due  to anemia.  Will convert cardizem to a daily medicine and switch metoprolol to coreg.  Wean diltiazem as heart rates improve. 2. Acute on chronic systolic HF - Mild pedal edema on exam; continue present dose of lasix ; watch renal function. 3. PNA - per primary team 4. Anemia - per primary team 5. Acute on chronic renal failure  Will need to follow-up with [name] [name] in our [place] office 2 weeks post discharge Will see as needed over the weekend.  Please call with questions  Hillis Range MD 7:31 AM

## 2012-09-22 NOTE — Progress Notes (Signed)
Physical Therapy Treatment Patient Details Name: [name] [name] MRN: [id number] DOB: 1956/07/26 Today's Date: 09/22/2012 Time: 4098-1191 PT Time Calculation (min): 24 min  PT Assessment / Plan / Recommendation Comments on Treatment Session  Great progress. Activity tolerance slowerly improving, needs to continue ambulating with nursing over the weekend.     Follow Up Recommendations  SNF     Does the patient have the potential to tolerate intense rehabilitation     Barriers to Discharge        Equipment Recommendations  None recommended by PT    Recommendations for Other Services    Frequency Min 3X/week   Plan Discharge plan remains appropriate;Frequency remains appropriate    Precautions / Restrictions Precautions Precautions: Fall Restrictions Weight Bearing Restrictions: No   Pertinent Vitals/Pain HR 93-108 during ambulation, A-fib; increased DOE 2-3/4     Mobility  Bed Mobility Sitting - Scoot to Edge of Bed: Not tested (comment) Transfers Transfers: Sit to Stand;Stand to Sit (4 trials) Sit to Stand: 4: Min guard;With upper extremity assist;From chair/3-in-1 Stand to Sit: 4: Min guard;With upper extremity assist;To chair/3-in-1 Details for Transfer Assistance: cues for sequencing and safe hand placement Ambulation/Gait Ambulation/Gait Assistance: 4: Min guard Ambulation Distance (Feet): 300 Feet (3 seated rest breaks) Assistive device: Rolling walker Ambulation/Gait Assistance Details: cues for tall posture and forward gaze as well as safe positioning with RW; cues for self monitoring and slow controlled breathing during gait Gait Pattern: Trunk flexed General Gait Details: heavy reliance of RW especially as he fatigues, steps get shorter and pt more unstable needing closer gaurding as he fatigues    Exercises General Exercises - Lower Extremity Long Arc Quad: AROM;Both;5 reps;Seated Hip Flexion/Marching: AROM;Both;5 reps;Seated Toe Raises: AROM;Both;10  reps;Seated Heel Raises: AROM;Both;10 reps;Seated     PT Goals Acute Rehab PT Goals PT Goal: Sit to Stand - Progress: Progressing toward goal PT Goal: Stand to Sit - Progress: Progressing toward goal PT Goal: Ambulate - Progress: Progressing toward goal PT Goal: Perform Home Exercise Program - Progress: Progressing toward goal  Visit Information  Last PT Received On: 09/22/12 Assistance Needed: +2 (chair follow)    Subjective Data  Subjective: Let me exercise my ankles first. Patient Stated Goal: walk   Cognition  Overall Cognitive Status: Appears within functional limits for tasks assessed/performed Arousal/Alertness: Awake/alert Orientation Level: Appears intact for tasks assessed Behavior During Session: [facility] for tasks performed    Balance     End of Session PT - End of Session Equipment Utilized During Treatment: Gait belt Activity Tolerance: Patient tolerated treatment well Patient left: in chair;with call bell/phone within reach Nurse Communication: Mobility status   GP     [facility] HELEN 09/22/2012, 1:16 PM

## 2012-09-22 NOTE — Clinical Social Work Note (Signed)
CSW confirmed that SNF bed at [facility] will be ready over weekend if Pt is medically ready for dc. If not, CSW will f/u on Monday to assist with possible dc to snf.  MD updated on dc plans.  Pt updated on dc plans and is agreeable to snf.   Frederico Hamman, LCSW [phone]

## 2012-09-23 ENCOUNTER — Inpatient Hospital Stay (HOSPITAL_COMMUNITY): Payer: PRIVATE HEALTH INSURANCE

## 2012-09-23 LAB — BASIC METABOLIC PANEL
BUN: 35 mg/dL — ABNORMAL HIGH (ref 6–23)
CO2: 24 mEq/L (ref 19–32)
Chloride: 101 mEq/L (ref 96–112)
Creatinine, Ser: 1.36 mg/dL — ABNORMAL HIGH (ref 0.50–1.35)
GFR calc Af Amer: 67 mL/min — ABNORMAL LOW (ref >90)
Glucose, Bld: 98 mg/dL (ref 70–99)
Potassium: 4.5 mEq/L (ref 3.5–5.1)

## 2012-09-23 LAB — CBC
HCT: 23.3 % — ABNORMAL LOW (ref 39.0–52.0)
Hemoglobin: 7.4 g/dL — ABNORMAL LOW (ref 13.0–17.0)
MCV: 83.2 fL (ref 78.0–100.0)
RBC: 2.8 MIL/uL — ABNORMAL LOW (ref 4.22–5.81)
WBC: 9.4 10*3/uL (ref 4.0–10.5)

## 2012-09-23 NOTE — Progress Notes (Signed)
Patient is doing well. No new complaints.  Following at a distance this weekend.

## 2012-09-23 NOTE — Progress Notes (Signed)
[facility] PROGRESS NOTE  [name] [name] ZOX:[id number] DOB: 1956-09-21 DOA: 09/06/2012 PCP: [name], MD  Assessment/Plan: Active Problems:  Atrial fibrillation with RVR  Chronic systolic CHF (congestive heart failure), NYHA class 3  IMPLANTATION OF DEFIBRILLATOR, HX OF  Iron deficiency anemia, unspecified  CAP (community acquired pneumonia)  Cardiomyopathy, nonischemic  Hypertension  Acute respiratory failure with hypoxia  Warfarin-induced coagulopathy  Mild epistaxis  CKD (chronic kidney disease) stage 2, GFR 60-89 ml/min  Acute renal failure  Thrombocytopenia     1. Severe community-acquired pneumonia/Acute hypoxic respiratory failure:  Patient presented with chills, and a productive cough. CXR demonstrated bilateral patchy airspace opacities, right greater than left, and he had a leukocytosis of 12.0. He was managed initially, with iv rocephin/Azithromycin, but airspace disease progressed, he continued to spike pyrexia, and on 09/09/12, spiked as high as 103.5 degrees Farenheit, with a tachypnea of 30-40/min and Oxygen saturation in the 80s. Antibiotics were broadened to include Vancomycin and Cefepime, while Rocephin was discontinued. He was transferred to ICU on that date. Clinically, he has had a rather turbulent clinical course, but now appears to have turned the corner. Follow up CXR on 09/18/2012 was without significant improvement, but clinically, pneumonia has improved greatly, wcc had normalized at 8.5 as of 06/21/12, and he is now saturating at 98%-100% off oxygen. He did have an episode of aspiration/Pneumonitis on 09/14/2012, related to episode of epistaxis, but this has since resolved. SLP has cleared for diet and patient is tolerating a mechanical soft diet. Repeat CXR on 09/23/12 showed that right lower lobe air space disease may be slightly improved. There is new left lower lobe air space disease. It is clear that patient has recurrent silent aspiration. He  has been afebrile for the last 72 hours. Will aim a total of 14 days of current antibiotics, to be concluded on 09/26/12.  2. Chronic systolic congestive heart failure/Non-ischemic cardiomyopathy:  Patient has a known history of chronic systolic CHF, nonischemic cardiomyopathy, cardiac arrest (1998), s/p AICD. He initially appeared compensated on admission, but became acutely dyspneic, on 09/07/12, after completion of blood transfusion. CXR confirmed volume overload, and patient was managed aggressively with iv Lasix. Rothbart provided cardiology consultation and ICD check was carried out by Dr Hillis Range, EPS, on 09/08/12. ICD interrogation was reviewed and declared normal. Cardiology team has continued to follow and have been very helpful in managing decompensated heart failure,in this complicated patient. As of 09/21/12, he appears clinically better compensated.  3. Atrial fibrillation:   On 09/08/12, patient went into fast atrial fibrillation, with HR in the 130s-140s. He was started on iv Cardizem, and iv Amiodarone was  Added on 09/09/12, because of unremitting tachycardia, after consultation with Cardiologist. Over the course of hospitalization, HR has improved, and rate-control has now been achieved with oral Cardizem and Lopressor. Cardiologist has changed to beta-blocker to Coreg on 09/22/12.  4. Hypertension:  BP is reasonably well controlled at this time, on diuretics, ACE-i, Cardizem and beta-blocker.   5. Hyperlipidemia: On statin.   6. Acute on chronic renal failure:   Baseline creatinine 1.2. Renal function appears stable at this time.  7. Anemia of chronic disease/ acute blood loss anemia due to epistaxis:   Severe, normocytic. Patient presented with a hemoglobin of 7.5, denied black stools, hematochezia or hematemesis. FOBT was negative, although hematinic studies have confirmed iron deficiency. He was transfused 2 units PRBC on 09/07/12, with bump in HB to 8.4, and was placed on empiric  Protonix. Dr Luisa Hart  Hung provided GI consultation, and has opined that patient is high risk for EGD/Colonoscopy, given his comorbidities. Anticoagulation was placed on hold, against a background of supra-therapeutic INR. 5 mg Vit K was given iv on 09/07/12, and INR has since drifted down. An attempt to restart anticoagulation, resulted in epistaxis, with further worsening of anemia. He has had a total of 6 units PRBC so far, with the last 2, administered today, for a hemoglobin of 6.4. Per Dr Dietrich Pates, patient is at substantial risk for thrombembolism, so the matter has to be revisited. A GI evaluation is would be helpful if feasible, and will explore this option, when patient is clinically more stable. Following CBC.  8. Warfarin-induced Coagulopathy:  See above discussion.  9. Suspected sleep apnea:   Patient may benefit from outpatient sleep study.     Code Status: Full Code.  Family Communication:  Disposition Plan: To be determined.    Brief narrative: Brief narrative:  56 year old patient with long-standing chronic severe systolic dysfunction with an EF of around 35%. He presented on 09/06/2012 with complaints of dyspnea, cough with productive sputum, fevers and chills felt to be related to pneumonia. He was also found to be anemic. Previous hemoglobin in 2008 was 13 and was down to just above 8. He had persistent fevers and worsening respiratory status after admission so was transferred to the ICU on 09/09/2012. PCCM assumed care of the patient because of respiratory difficulties. Since admission patient developed issues with atrial fibrillation and RVR that required cardiology consultation and the patient rate had been stabilized on diltiazem drip and he had subsequently been converted to oral Cardizem. He also continued to have respiratory problems that were associated with superimposed heart failure presumed secondary to stressors of chronic anemia and pneumonia. Because of this cardiology  has started the patient on a milrinone infusion. [facility] reassumed care of this patient on 09/14/2012 but he once again decompensated from a respiratory standpoint and required transfer back to the ICU on the same date. It was felt his respiratory symptoms were related to aspiration from recent severe epistaxis. He subsequently restarted her eyes to it was deemed once again appropriate to transfer to the step down unit.   Consultants:  Dr Bramwell Bing, cardiologist.  Dr Jeani Hawking, GI.  Dr Wynelle Cleveland Roma, PCCM.   Procedures:  Serial CXRs.   Antibiotics:  Cefepime 09/10/12>>>  Vancomycin 09/10/12-09/14/12  HPI/Subjective: No new issues.   Objective: Vital signs in last 24 hours: Temp:  [98.1 F (36.7 C)-98.8 F (37.1 C)] 98.1 F (36.7 C) (11/09 0611) Pulse Rate:  [69-110] 88  (11/09 0611) Resp:  [18] 18  (11/09 0611) BP: (121-153)/(60-78) 128/68 mmHg (11/09 0948) SpO2:  [99 %-100 %] 100 % (11/09 0611) Weight:  [111.4 kg (245 lb 9.5 oz)] 111.4 kg (245 lb 9.5 oz) (11/09 0611) Weight change: 0.2 kg (7.1 oz) Last BM Date: 09/22/12  Intake/Output from previous day: 11/08 0701 - 11/09 0700 In: 720 [P.O.:720] Out: 1100 [Urine:1100]     Physical Exam: General: Alert, communicative, fully oriented, not short of breath at rest.  HEENT: Moderate clinical pallor, no jaundice, no conjunctival injection or discharge.  NECK: Supple, JVP not seen, no carotid bruits, no palpable lymphadenopathy, no palpable goiter.  CHEST: Clinically clear to auscultation.  HEART: Sounds 1 and 2 heard, normal, irregular, no murmurs.  ABDOMEN: Full, soft, no scars, non-tender, no palpable organomegaly, no palpable masses, normal bowel sounds.  GENITALIA: Not examined.  LOWER EXTREMITIES: No pitting edema,  palpable peripheral pulses.  MUSCULOSKELETAL SYSTEM: Unremarkable.  CENTRAL NERVOUS SYSTEM: No focal neurologic deficit on gross examination.  Lab Results:  Basename 09/23/12 0510  09/22/12 0455  WBC 9.4 8.7  HGB 7.4* 7.8*  HCT 23.3* 24.2*  PLT 263 220    Basename 09/23/12 0510 09/22/12 0455  NA 134* 135  K 4.5 4.7  CL 101 102  CO2 24 23  GLUCOSE 98 122*  BUN 35* 42*  CREATININE 1.36* 1.48*  CALCIUM 8.9 8.8   Recent Results (from the past 240 hour(s))  CULTURE, BLOOD (ROUTINE X 2)     Status: Normal   Collection Time   09/14/12  4:00 PM      Component Value Range Status Comment   Specimen Description BLOOD RIGHT HAND   Final    Special Requests BOTTLES DRAWN AEROBIC AND ANAEROBIC 10CC   Final    Culture  Setup Time 09/14/2012 22:39   Final    Culture NO GROWTH 5 DAYS   Final    Report Status 09/20/2012 FINAL   Final   CULTURE, BLOOD (ROUTINE X 2)     Status: Normal   Collection Time   09/14/12  4:06 PM      Component Value Range Status Comment   Specimen Description BLOOD LEFT ARM   Final    Special Requests BOTTLES DRAWN AEROBIC AND ANAEROBIC 10CC   Final    Culture  Setup Time 09/14/2012 22:39   Final    Culture NO GROWTH 5 DAYS   Final    Report Status 09/20/2012 FINAL   Final   URINE CULTURE     Status: Normal   Collection Time   09/14/12  9:31 PM      Component Value Range Status Comment   Specimen Description URINE, CLEAN CATCH   Final    Special Requests NONE   Final    Culture  Setup Time 09/14/2012 22:35   Final    Colony Count NO GROWTH   Final    Culture NO GROWTH   Final    Report Status 09/16/2012 FINAL   Final   CULTURE, EXPECTORATED SPUTUM-ASSESSMENT     Status: Normal   Collection Time   09/19/12 11:30 PM      Component Value Range Status Comment   Specimen Description SPUTUM   Final    Special Requests NONE   Final    Sputum evaluation     Final    Value: THIS SPECIMEN IS ACCEPTABLE. RESPIRATORY CULTURE REPORT TO FOLLOW.   Report Status 09/20/2012 FINAL   Final   CULTURE, RESPIRATORY     Status: Normal   Collection Time   09/19/12 11:30 PM      Component Value Range Status Comment   Specimen Description SPUTUM   Final      Special Requests NONE   Final    Gram Stain     Final    Value: ABUNDANT WBC PRESENT, PREDOMINANTLY PMN     NO SQUAMOUS EPITHELIAL CELLS SEEN     NO ORGANISMS SEEN   Culture FEW CANDIDA ALBICANS   Final    Report Status 09/22/2012 FINAL   Final   CULTURE, BLOOD (ROUTINE X 2)     Status: Normal (Preliminary result)   Collection Time   09/20/12  4:58 PM      Component Value Range Status Comment   Specimen Description BLOOD LEFT HAND   Final    Special Requests BOTTLES DRAWN AEROBIC ONLY 5CC  Final    Culture  Setup Time 09/20/2012 23:51   Final    Culture     Final    Value:        BLOOD CULTURE RECEIVED NO GROWTH TO DATE CULTURE WILL BE HELD FOR 5 DAYS BEFORE ISSUING A FINAL NEGATIVE REPORT   Report Status PENDING   Incomplete   CULTURE, BLOOD (ROUTINE X 2)     Status: Normal (Preliminary result)   Collection Time   09/20/12  5:05 PM      Component Value Range Status Comment   Specimen Description BLOOD RIGHT ARM   Final    Special Requests BOTTLES DRAWN AEROBIC AND ANAEROBIC 10CC   Final    Culture  Setup Time 09/20/2012 23:50   Final    Culture     Final    Value:        BLOOD CULTURE RECEIVED NO GROWTH TO DATE CULTURE WILL BE HELD FOR 5 DAYS BEFORE ISSUING A FINAL NEGATIVE REPORT   Report Status PENDING   Incomplete      Studies/Results: Dg Chest 2 View  09/23/2012  *RADIOLOGY REPORT*  Clinical Data: Pneumonia, productive cough  CHEST - 2 VIEW  Comparison: Chest radiograph 09/18/2012  Findings: Left-sided pacemaker overlies stable enlarged heart silhouette.  There is some improvement in the air space disease in the right lower lobe.  Left lower lung demonstrates mild increase in atelectasis.  There is a potential new air space disease in left lower lobe additionally.  Upper lungs relatively clear.  IMPRESSION:  1. Right lower lobe air space disease may be slightly improved. 2.  New left lower lobe air space disease. 3.  Overall pattern of multifocal pneumonia or aspiration  pneumonitis.   Original Report Authenticated By: Genevive Bi, M.D.     Medications: Scheduled Meds:    . carvedilol  12.5 mg Oral BID WC  . diltiazem  240 mg Oral Daily  . feeding supplement  237 mL Oral Q24H  . ferrous sulfate  325 mg Oral BID WC  . furosemide  40 mg Oral Daily  . isosorbide mononitrate  60 mg Oral Daily  . lisinopril  5 mg Oral Daily  . pantoprazole  40 mg Oral Q1200  . pravastatin  40 mg Oral QPC supper  . sodium chloride  3 mL Intravenous Q12H   Continuous Infusions:  PRN Meds:.sodium chloride, acetaminophen, ibuprofen, levalbuterol, oxyCODONE, sodium chloride    LOS: 17 days   Samanta Gal,CHRISTOPHER  [facility] Pager [phone]. If 8PM-8AM, please contact night-coverage at [email or web address], password [facility] 09/23/2012, 11:22 AM  LOS: 17 days

## 2012-09-23 NOTE — Progress Notes (Signed)
Patient up in chair this am will monitor patient . [name] [name], Randall An RN

## 2012-09-23 NOTE — Progress Notes (Signed)
Patient ambulated in hallway, 250 feet with walker x1 assist. Pt tolerated well slightly short of breath at the end of walk. Pt back in chair call bell with in reach. Will continue to monitor patient. [name] [name], Randall An rN

## 2012-09-24 LAB — CBC
HCT: 23 % — ABNORMAL LOW (ref 39.0–52.0)
Hemoglobin: 7.4 g/dL — ABNORMAL LOW (ref 13.0–17.0)
MCH: 27.2 pg (ref 26.0–34.0)
MCHC: 32.2 g/dL (ref 30.0–36.0)
MCV: 84.6 fL (ref 78.0–100.0)
RDW: 15.8 % — ABNORMAL HIGH (ref 11.5–15.5)

## 2012-09-24 LAB — BASIC METABOLIC PANEL
BUN: 29 mg/dL — ABNORMAL HIGH (ref 6–23)
Creatinine, Ser: 1.31 mg/dL (ref 0.50–1.35)
GFR calc non Af Amer: 60 mL/min — ABNORMAL LOW (ref >90)
Glucose, Bld: 98 mg/dL (ref 70–99)
Potassium: 4.7 mEq/L (ref 3.5–5.1)

## 2012-09-24 MED ORDER — SODIUM CHLORIDE 0.9 % IV SOLN
INTRAVENOUS | Status: DC
  Administered 2012-09-24: 14:00:00 via INTRAVENOUS
  Administered 2012-09-25: 500 mL via INTRAVENOUS

## 2012-09-24 NOTE — Progress Notes (Signed)
Pt ambulated with rolling walker approx 400 feet. Tolerated fairly well, was short of breath on the walk. Pt back to chair with call bell in reach. Pt requesting to speak with Dr. Brien Few about EGD tomorrow. Pt will no sign consent until he is able to talk to him. Will notify MD.

## 2012-09-24 NOTE — Progress Notes (Signed)
Patient ambulated with RN using rolling walker on room air approximately 468ft. Patient's gait was slow and steady; patient paused twice during the walk but was not in any distress. Patient returned to chair and resting. Will continue to monitor.

## 2012-09-24 NOTE — Consult Note (Signed)
[facility] Consultation  Referring Provider: No ref. provider found Primary Care Physician:  [name], MD Primary Gastroenterologist:  Dr.  Jaquita Rector for Consultation:  **Anemia  HPI: [name] [name] is a 56 y.o. male and we are asked to see again for evaluation of anemia. He was evaluated by Dr. Elnoria Howard on October 25 because of a normochromic, normocytic anemia. He was Hemoccult negative at that time. He should has a cardiomyopathy and was submitted with pneumonia and respiratory failure. He was initially on Coumadin but this was discontinued. He is been transfused with 6 units of packed cells during his hospital course. He's had no frank rectal bleeding or melena. He states that he thinks he had a colonoscopy 8-9 months ago. He also believes he may have had a remote history of peptic ulcer disease.  Past Medical History  Diagnosis Date  . Cardiomyopathy, nonischemic     EF 35-40% BY 2D ECHO, h/o clinical congestive heart failure; h/o cardiac arrest for which an ICD was implanted in 1998  . Hypertension   . AF (atrial fibrillation)     permanent, on coumadin  . Cardiac arrest 1998    ICD implanted at [facility] at that time  . Tobacco abuse, in remission     20 pack years or less; discontinued in 1998    Past Surgical History  Procedure Date  . Cardiac defibrillator placement 1998  . 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 Authorizing Provider  Cholecalciferol (VITAMIN D3) 2000 UNITS capsule Take 2,000 Units by mouth daily.     Yes Historical Provider, MD  diltiazem (CARDIZEM CD) 180 MG 24 hr capsule Take 180 mg by mouth daily. 02/24/12  Yes June Leap, MD  finasteride (PROSCAR) 5 MG tablet Take 5 mg by mouth daily.    Yes Historical Provider, MD  lisinopril  (PRINIVIL,ZESTRIL) 10 MG tablet Take 1 tablet (10 mg total) by mouth daily. 05/22/12  Yes June Leap, MD  metoprolol (LOPRESSOR) 100 MG tablet Take 1 tablet (100 mg total) by mouth 2 (two) times daily. 02/24/12  Yes June Leap, MD  polyethylene glycol powder [facility]) powder Take 17 g by mouth daily as needed. For constipation - mix with 8 oz liquid and drink 04/19/12  Yes Historical Provider, MD  potassium chloride (MICRO-K) 10 MEQ CR capsule Take 1 capsule (10 mEq total) by mouth daily. 02/24/12  Yes June Leap, MD  pravastatin (PRAVACHOL) 40 MG tablet Take 40 mg by mouth at bedtime.    Yes Historical Provider, MD  warfarin (COUMADIN) 2 MG tablet Take 2 mg by mouth See admin instructions. Take with 5 mg tablet on Saturday, Sunday, Monday, Tuesday and Thursday   Yes Historical Provider, MD  warfarin (COUMADIN) 3 MG tablet Take 3 mg by mouth See admin instructions. Take with 5 mg tablet on Wednesday and Friday   Yes Historical Provider, MD  warfarin (COUMADIN) 5 MG tablet Take 5 mg by mouth See admin instructions. On Wednesday and Friday, take with 3 mg tablet for an 8 mg dose.  On all other days take with 2 mg tablet for a 7 mg dose   Yes Historical Provider, MD    Current Facility-Administered Medications  Medication Dose Route Frequency Provider Last  Rate Last Dose  . 0.9 %  sodium chloride infusion  250 mL Intravenous PRN Therisa Doyne, MD 10 mL/hr at 09/14/12 1500 250 mL at 09/14/12 1500  . acetaminophen (TYLENOL) tablet 650 mg  650 mg Oral Q6H PRN Laveda Norman, MD   650 mg at 09/23/12 1003  . carvedilol (COREG) tablet 12.5 mg  12.5 mg Oral BID WC Hillis Range, MD   12.5 mg at 09/24/12 0700  . diltiazem (CARDIZEM CD) 24 hr capsule 240 mg  240 mg Oral Daily Hillis Range, MD   240 mg at 09/24/12 0944  . feeding supplement (ENSURE COMPLETE) liquid 237 mL  237 mL Oral Q24H Tonye Becket, RD   237 mL at 09/24/12 1000  . ferrous sulfate tablet 325 mg  325 mg Oral BID WC Alyson Reedy, MD   325 mg at 09/24/12 0757  . furosemide (LASIX) tablet 40 mg  40 mg Oral Daily Hillis Range, MD   40 mg at 09/24/12 0944  . ibuprofen (ADVIL,MOTRIN) tablet 200 mg  200 mg Oral Q6H PRN Lonia Blood, MD   200 mg at 09/21/12 1049  . isosorbide mononitrate (IMDUR) 24 hr tablet 60 mg  60 mg Oral Daily Kathlen Brunswick, MD   60 mg at 09/24/12 0944  . levalbuterol (XOPENEX) nebulizer solution 0.63 mg  0.63 mg Nebulization Q2H PRN Laveda Norman, MD   0.63 mg at 09/11/12 0233  . lisinopril (PRINIVIL,ZESTRIL) tablet 5 mg  5 mg Oral Daily Vesta Mixer, MD   5 mg at 09/24/12 0944  . oxyCODONE (Oxy IR/ROXICODONE) immediate release tablet 5-10 mg  5-10 mg Oral Q4H PRN Lonia Blood, MD      . pantoprazole (PROTONIX) EC tablet 40 mg  40 mg Oral Q1200 [name], MD   40 mg at 09/24/12 1134  . pravastatin (PRAVACHOL) tablet 40 mg  40 mg Oral QPC supper Gala Lewandowsky [place], PHARMD   40 mg at 09/23/12 1700  . sodium chloride 0.9 % injection 3 mL  3 mL Intravenous Q12H Therisa Doyne, MD   3 mL at 09/24/12 0944  . sodium chloride 0.9 % injection 3 mL  3 mL Intravenous PRN Therisa Doyne, MD   3 mL at 09/18/12 2134    Allergies as of 09/06/2012  . (No Known Allergies)    Family History  Problem Relation Age of Onset  . Diabetes Mother     died @ 90.  . Diabetes Brother     this brother also has htn  . Hypertension Father     alive @ 6.  Marland Kitchen Hypertension Brother   . Alzheimer's disease Father     History   Social History  . Marital Status: Single    Spouse Name: N/A    Number of Children: N/A  . Years of Education: N/A   Occupational History  . [employer]    Social History Main Topics  . Smoking status: Former Smoker -- 0.8 packs/day for 6 years    Types: Cigarettes    Quit date: 11/15/1996  . Smokeless tobacco: Never Used  . Alcohol Use: No  . Drug Use: Yes    Special: Marijuana     Comment: many years ago  . Sexually Active: No   Other Topics Concern  .  Not on file   Social History Narrative   Lives in [place] with his father and 2 brothers.    Review of Systems: Gen: Denies any fever,  chills, sweats, anorexia, fatigue, weakness, malaise, weight loss, and sleep disorder CV: Denies chest pain, angina, palpitations, syncope, orthopnea, PND, peripheral edema, and claudication. Resp: Denies dyspnea at rest, dyspnea with exercise, cough, sputum, wheezing, coughing up blood, and pleurisy. GI: Denies vomiting blood, jaundice, and fecal incontinence.   Denies dysphagia or odynophagia. GU : Denies urinary burning, blood in urine, urinary frequency, urinary hesitancy, nocturnal urination, and urinary incontinence. MS: Denies joint pain, limitation of movement, and swelling, stiffness, low back pain, extremity pain. Denies muscle weakness, cramps, atrophy.  Derm: Denies rash, itching, dry skin, hives, moles, warts, or unhealing ulcers.  Psych: Denies depression, anxiety, memory loss, suicidal ideation, hallucinations, paranoia, and confusion. Heme: Denies bruising, bleeding, and enlarged lymph nodes. Neuro:  Denies any headaches, dizziness, paresthesias. Endo:  Denies any problems with DM, thyroid, adrenal function.  Physical Exam: Vital signs in last 24 hours: Temp:  [98.1 F (36.7 C)-98.7 F (37.1 C)] 98.7 F (37.1 C) (11/10 0455) Pulse Rate:  [81-113] 83  (11/10 0615) Resp:  [20] 20  (11/10 0455) BP: (115-135)/(68-81) 128/81 mmHg (11/10 0615) SpO2:  [95 %-100 %] 99 % (11/10 0455) Weight:  [241 lb 12.8 oz (109.68 kg)] 241 lb 12.8 oz (109.68 kg) (11/10 0455) Last BM Date: 09/24/12 General:   Alert,  Well-developed, well-nourished, pleasant and cooperative in NAD Head:  Normocephalic and atraumatic. Eyes:  Sclera clear, no icterus.   Conjunctiva pink. Ears:  Normal auditory acuity. Nose:  No deformity, discharge,  or lesions. Mouth:  No deformity or lesions.  Oropharynx pink & moist. Neck:  Supple; no masses or thyromegaly. Lungs:   Clear throughout to auscultation.   No wheezes, crackles, or rhonchi. No acute distress. Heart:  Regular rate and rhythm; no murmurs, clicks, rubs,  or gallops. Abdomen:  Soft, nontender and nondistended. No masses, hepatosplenomegaly or hernias noted. Normal bowel sounds, without guarding, and without rebound.   Rectal:  Deferred u.   Msk:  Symmetrical without gross deformities. Normal posture. Pulses:  Normal pulses noted. Extremities:  Without clubbing . There is 1+ pedal edema Neurologic:  Alert and  oriented x4;  grossly normal neurologically. Skin:  Intact without significant lesions or rashes. Cervical Nodes:  No significant cervical adenopathy. Psych:  Alert and cooperative. Normal mood and affect.  Intake/Output from previous day: 11/09 0701 - 11/10 0700 In: 483 [P.O.:480; I.V.:3] Out: 1596 [Urine:1595; Stool:1] Intake/Output this shift:    Lab Results:  Basename 09/24/12 0600 09/23/12 0510 09/22/12 0455  WBC 8.6 9.4 8.7  HGB 7.4* 7.4* 7.8*  HCT 23.0* 23.3* 24.2*  PLT 260 263 220   BMET  Basename 09/24/12 0600 09/23/12 0510 09/22/12 0455  NA 137 134* 135  K 4.7 4.5 4.7  CL 105 101 102  CO2 24 24 23   GLUCOSE 98 98 122*  BUN 29* 35* 42*  CREATININE 1.31 1.36* 1.48*  CALCIUM 9.2 8.9 8.8   LFT  Basename 09/22/12 0455  PROT 8.3  ALBUMIN 2.0*  AST 50*  ALT 46  ALKPHOS 124*  BILITOT 1.0  BILIDIR --  IBILI --   PT/INR No results found for this basename: LABPROT:2,INR:2 in the last 72 hours Hepatitis Panel No results found for this basename: HEPBSAG,HCVAB,HEPAIGM,HEPBIGM in the last 72 hours    Studies/Results: Dg Chest 2 View  09/23/2012  *RADIOLOGY REPORT*  Clinical Data: Pneumonia, productive cough  CHEST - 2 VIEW  Comparison: Chest radiograph 09/18/2012  Findings: Left-sided pacemaker overlies stable enlarged heart silhouette.  There is some improvement in the  air space disease in the right lower lobe.  Left lower lung demonstrates mild increase in  atelectasis.  There is a potential new air space disease in left lower lobe additionally.  Upper lungs relatively clear.  IMPRESSION:  1. Right lower lobe air space disease may be slightly improved. 2.  New left lower lobe air space disease. 3.  Overall pattern of multifocal pneumonia or aspiration pneumonitis.   Original Report Authenticated By: Genevive Bi, M.D.      Previous Endoscopies:   Impression / Plan: Ongoing transfusion requirement in the setting of multiple medical problems but without frank GI bleeding. The patient's initial iron studies were equivocal for an iron deficiency anemia. While he likely has an anemia of chronic disease, subacute GI bleeding should be ruled out. Patient is at increased risk for active peptic ulcer disease. Polyps, AVMs and neoplasm are other considerations.  Recommendations #1 serial Hemoccults #2 upper endoscopy-this will be scheduled for Dr. Elnoria Howard    LOS: 18 days   Melvia Heaps  09/24/2012, 12:53 PM

## 2012-09-24 NOTE — Progress Notes (Signed)
[facility] PROGRESS NOTE  [name] [name]:[id number] DOB: 03-01-1956 DOA: 09/06/2012 PCP: [name], MD  Assessment/Plan: Active Problems:  Atrial fibrillation with RVR  Chronic systolic CHF (congestive heart failure), NYHA class 3  IMPLANTATION OF DEFIBRILLATOR, HX OF  Iron deficiency anemia, unspecified  CAP (community acquired pneumonia)  Cardiomyopathy, nonischemic  Hypertension  Acute respiratory failure with hypoxia  Warfarin-induced coagulopathy  Mild epistaxis  CKD (chronic kidney disease) stage 2, GFR 60-89 ml/min  Acute renal failure  Thrombocytopenia     1. Severe community-acquired pneumonia/Acute hypoxic respiratory failure:  Patient presented with chills, and a productive cough. CXR demonstrated bilateral patchy airspace opacities, right greater than left, and he had a leukocytosis of 12.0. He was managed initially, with iv rocephin/Azithromycin, but airspace disease progressed, he continued to spike pyrexia, and on 09/09/12, spiked as high as 103.5 degrees Farenheit, with a tachypnea of 30-40/min and Oxygen saturation in the 80s. Antibiotics were broadened to include Vancomycin and Cefepime, while Rocephin was discontinued. He was transferred to ICU on that date. Clinically, he has had a rather turbulent clinical course, but now appears to have turned the corner. Follow up CXR on 09/18/2012 was without significant improvement, but clinically, pneumonia has improved greatly, wcc had normalized at 8.5 as of 06/21/12, and he is now saturating at 98%-100% off oxygen. He did have an episode of aspiration/Pneumonitis on 09/14/2012, related to episode of epistaxis, but this has since resolved. SLP has cleared for diet and patient is tolerating a mechanical soft diet. Repeat CXR on 09/23/12 showed that right lower lobe air space disease may be slightly improved. There is new left lower lobe air space disease. It is clear that patient has recurrent silent aspiration. He  has been afebrile for the last 72 hours. Will aim a total of 14 days of current antibiotics, to be concluded on 09/26/12.  2. Chronic systolic congestive heart failure/Non-ischemic cardiomyopathy:  Patient has a known history of chronic systolic CHF, nonischemic cardiomyopathy, cardiac arrest (1998), s/p AICD. He initially appeared compensated on admission, but became acutely dyspneic, on 09/07/12, after completion of blood transfusion. CXR confirmed volume overload, and patient was managed aggressively with iv Lasix. Rothbart provided cardiology consultation and ICD check was carried out by Dr Hillis Range, EPS, on 09/08/12. ICD interrogation was reviewed and declared normal. Cardiology team has continued to follow and have been very helpful in managing decompensated heart failure,in this complicated patient. As of 09/21/12, he appears clinically better compensated.  3. Atrial fibrillation:   On 09/08/12, patient went into fast atrial fibrillation, with HR in the 130s-140s. He was started on iv Cardizem, and iv Amiodarone was  Added on 09/09/12, because of unremitting tachycardia, after consultation with Cardiologist. Over the course of hospitalization, HR has improved, and rate-control has now been achieved with oral Cardizem and Lopressor. Cardiologist has changed to beta-blocker to Coreg on 09/22/12. Managing as recommended. 4. Hypertension:  BP is well controlled at this time, on diuretics, ACE-i, Cardizem and beta-blocker.   5. Hyperlipidemia: On statin.   6. Acute on chronic renal failure:   Baseline creatinine 1.2. Renal function appears stable at this time.  7. Anemia of chronic disease/ acute blood loss anemia due to epistaxis:   Severe, normocytic. Patient presented with a hemoglobin of 7.5, denied black stools, hematochezia or hematemesis. FOBT was negative, although hematinic studies have confirmed iron deficiency. He was transfused 2 units PRBC on 09/07/12, with bump in HB to 8.4, and was  placed on empiric Protonix. Dr  Jeani Hawking provided GI consultation, and has opined that patient is high risk for EGD/Colonoscopy, given his comorbidities. Anticoagulation was placed on hold, against a background of supra-therapeutic INR. 5 mg Vit K was given iv on 09/07/12, and INR has since drifted down. An attempt to restart anticoagulation, resulted in epistaxis, with further worsening of anemia. He has had a total of 6 units PRBC so far, with the last 2, administered today, for a hemoglobin of 6.4. Per Dr Dietrich Pates, patient is at substantial risk for thrombembolism, so the matter has to be revisited. A GI evaluation is would be helpful if feasible. As patient is clinically more stable,as of today, will consult GI.  8. Warfarin-induced Coagulopathy:  See above discussion.  9. Suspected sleep apnea:   Patient may benefit from outpatient sleep study.     Code Status: Full Code.  Family Communication:  Disposition Plan: To be determined.    Brief narrative: Brief narrative:  56 year old patient with long-standing chronic severe systolic dysfunction with an EF of around 35%. He presented on 09/06/2012 with complaints of dyspnea, cough with productive sputum, fevers and chills felt to be related to pneumonia. He was also found to be anemic. Previous hemoglobin in 2008 was 13 and was down to just above 8. He had persistent fevers and worsening respiratory status after admission so was transferred to the ICU on 09/09/2012. PCCM assumed care of the patient because of respiratory difficulties. Since admission patient developed issues with atrial fibrillation and RVR that required cardiology consultation and the patient rate had been stabilized on diltiazem drip and he had subsequently been converted to oral Cardizem. He also continued to have respiratory problems that were associated with superimposed heart failure presumed secondary to stressors of chronic anemia and pneumonia. Because of this cardiology  has started the patient on a milrinone infusion. [facility] reassumed care of this patient on 09/14/2012 but he once again decompensated from a respiratory standpoint and required transfer back to the ICU on the same date. It was felt his respiratory symptoms were related to aspiration from recent severe epistaxis. He subsequently restarted her eyes to it was deemed once again appropriate to transfer to the step down unit.   Consultants:  Dr Blair Bing, cardiologist.  Dr Jeani Hawking, GI.  Dr Wynelle Cleveland Roma, PCCM.   Procedures:  Serial CXRs.   Antibiotics:  Cefepime 09/10/12>>>  Vancomycin 09/10/12-09/14/12  HPI/Subjective: Feels much better.   Objective: Vital signs in last 24 hours: Temp:  [98.1 F (36.7 C)-98.7 F (37.1 C)] 98.7 F (37.1 C) (11/10 0455) Pulse Rate:  [81-113] 83  (11/10 0615) Resp:  [20] 20  (11/10 0455) BP: (115-135)/(68-81) 128/81 mmHg (11/10 0615) SpO2:  [95 %-100 %] 99 % (11/10 0455) Weight:  [109.68 kg (241 lb 12.8 oz)] 109.68 kg (241 lb 12.8 oz) (11/10 0455) Weight change: -1.72 kg (-3 lb 12.7 oz) Last BM Date: 09/24/12  Intake/Output from previous day: 11/09 0701 - 11/10 0700 In: 483 [P.O.:480; I.V.:3] Out: 1596 [Urine:1595; Stool:1]     Physical Exam: General: Sitting in chair, alert, communicative, fully oriented, not short of breath at rest.  HEENT: Moderate clinical pallor, no jaundice, no conjunctival injection or discharge.  NECK: Supple, JVP not seen, no carotid bruits, no palpable lymphadenopathy, no palpable goiter.  CHEST: Clinically clear to auscultation.  HEART: Sounds 1 and 2 heard, normal, irregular, no murmurs.  ABDOMEN: Full, soft, no scars, non-tender, no palpable organomegaly, no palpable masses, normal bowel sounds.  GENITALIA: Not examined.  LOWER EXTREMITIES: No pitting edema, palpable peripheral pulses.  MUSCULOSKELETAL SYSTEM: Unremarkable.  CENTRAL NERVOUS SYSTEM: No focal neurologic deficit on gross  examination.  Lab Results:  Basename 09/24/12 0600 09/23/12 0510  WBC 8.6 9.4  HGB 7.4* 7.4*  HCT 23.0* 23.3*  PLT 260 263    Basename 09/24/12 0600 09/23/12 0510  NA 137 134*  K 4.7 4.5  CL 105 101  CO2 24 24  GLUCOSE 98 98  BUN 29* 35*  CREATININE 1.31 1.36*  CALCIUM 9.2 8.9   Recent Results (from the past 240 hour(s))  CULTURE, BLOOD (ROUTINE X 2)     Status: Normal   Collection Time   09/14/12  4:00 PM      Component Value Range Status Comment   Specimen Description BLOOD RIGHT HAND   Final    Special Requests BOTTLES DRAWN AEROBIC AND ANAEROBIC 10CC   Final    Culture  Setup Time 09/14/2012 22:39   Final    Culture NO GROWTH 5 DAYS   Final    Report Status 09/20/2012 FINAL   Final   CULTURE, BLOOD (ROUTINE X 2)     Status: Normal   Collection Time   09/14/12  4:06 PM      Component Value Range Status Comment   Specimen Description BLOOD LEFT ARM   Final    Special Requests BOTTLES DRAWN AEROBIC AND ANAEROBIC 10CC   Final    Culture  Setup Time 09/14/2012 22:39   Final    Culture NO GROWTH 5 DAYS   Final    Report Status 09/20/2012 FINAL   Final   URINE CULTURE     Status: Normal   Collection Time   09/14/12  9:31 PM      Component Value Range Status Comment   Specimen Description URINE, CLEAN CATCH   Final    Special Requests NONE   Final    Culture  Setup Time 09/14/2012 22:35   Final    Colony Count NO GROWTH   Final    Culture NO GROWTH   Final    Report Status 09/16/2012 FINAL   Final   CULTURE, EXPECTORATED SPUTUM-ASSESSMENT     Status: Normal   Collection Time   09/19/12 11:30 PM      Component Value Range Status Comment   Specimen Description SPUTUM   Final    Special Requests NONE   Final    Sputum evaluation     Final    Value: THIS SPECIMEN IS ACCEPTABLE. RESPIRATORY CULTURE REPORT TO FOLLOW.   Report Status 09/20/2012 FINAL   Final   CULTURE, RESPIRATORY     Status: Normal   Collection Time   09/19/12 11:30 PM      Component Value Range  Status Comment   Specimen Description SPUTUM   Final    Special Requests NONE   Final    Gram Stain     Final    Value: ABUNDANT WBC PRESENT, PREDOMINANTLY PMN     NO SQUAMOUS EPITHELIAL CELLS SEEN     NO ORGANISMS SEEN   Culture FEW CANDIDA ALBICANS   Final    Report Status 09/22/2012 FINAL   Final   CULTURE, BLOOD (ROUTINE X 2)     Status: Normal (Preliminary result)   Collection Time   09/20/12  4:58 PM      Component Value Range Status Comment   Specimen Description BLOOD LEFT HAND   Final    Special Requests BOTTLES DRAWN AEROBIC  ONLY 5CC   Final    Culture  Setup Time 09/20/2012 23:51   Final    Culture     Final    Value:        BLOOD CULTURE RECEIVED NO GROWTH TO DATE CULTURE WILL BE HELD FOR 5 DAYS BEFORE ISSUING A FINAL NEGATIVE REPORT   Report Status PENDING   Incomplete   CULTURE, BLOOD (ROUTINE X 2)     Status: Normal (Preliminary result)   Collection Time   09/20/12  5:05 PM      Component Value Range Status Comment   Specimen Description BLOOD RIGHT ARM   Final    Special Requests BOTTLES DRAWN AEROBIC AND ANAEROBIC 10CC   Final    Culture  Setup Time 09/20/2012 23:50   Final    Culture     Final    Value:        BLOOD CULTURE RECEIVED NO GROWTH TO DATE CULTURE WILL BE HELD FOR 5 DAYS BEFORE ISSUING A FINAL NEGATIVE REPORT   Report Status PENDING   Incomplete      Studies/Results: Dg Chest 2 View  09/23/2012  *RADIOLOGY REPORT*  Clinical Data: Pneumonia, productive cough  CHEST - 2 VIEW  Comparison: Chest radiograph 09/18/2012  Findings: Left-sided pacemaker overlies stable enlarged heart silhouette.  There is some improvement in the air space disease in the right lower lobe.  Left lower lung demonstrates mild increase in atelectasis.  There is a potential new air space disease in left lower lobe additionally.  Upper lungs relatively clear.  IMPRESSION:  1. Right lower lobe air space disease may be slightly improved. 2.  New left lower lobe air space disease. 3.  Overall  pattern of multifocal pneumonia or aspiration pneumonitis.   Original Report Authenticated By: Genevive Bi, M.D.     Medications: Scheduled Meds:    . carvedilol  12.5 mg Oral BID WC  . diltiazem  240 mg Oral Daily  . feeding supplement  237 mL Oral Q24H  . ferrous sulfate  325 mg Oral BID WC  . furosemide  40 mg Oral Daily  . isosorbide mononitrate  60 mg Oral Daily  . lisinopril  5 mg Oral Daily  . pantoprazole  40 mg Oral Q1200  . pravastatin  40 mg Oral QPC supper  . sodium chloride  3 mL Intravenous Q12H   Continuous Infusions:  PRN Meds:.sodium chloride, acetaminophen, ibuprofen, levalbuterol, oxyCODONE, sodium chloride    LOS: 18 days   Rashana Andrew,CHRISTOPHER  [facility] Pager [phone]. If 8PM-8AM, please contact night-coverage at [email or web address], password [facility] 09/24/2012, 10:59 AM  LOS: 18 days

## 2012-09-25 ENCOUNTER — Encounter (HOSPITAL_COMMUNITY)
Admission: EM | Disposition: A | Discharge status: Skilled Nursing Facility | Payer: Self-pay | Source: Home / Self Care | Attending: Internal Medicine

## 2012-09-25 ENCOUNTER — Encounter (HOSPITAL_COMMUNITY): Payer: Self-pay | Admitting: *Deleted

## 2012-09-25 HISTORY — PX: ESOPHAGOGASTRODUODENOSCOPY: SHX5428

## 2012-09-25 LAB — CBC
MCHC: 31.9 g/dL (ref 30.0–36.0)
MCV: 84.6 fL (ref 78.0–100.0)
Platelets: 246 10*3/uL (ref 150–400)
RDW: 15.9 % — ABNORMAL HIGH (ref 11.5–15.5)
WBC: 8.3 10*3/uL (ref 4.0–10.5)

## 2012-09-25 LAB — BASIC METABOLIC PANEL
Chloride: 102 mEq/L (ref 96–112)
Creatinine, Ser: 1.32 mg/dL (ref 0.50–1.35)
GFR calc Af Amer: 69 mL/min — ABNORMAL LOW (ref >90)
GFR calc non Af Amer: 60 mL/min — ABNORMAL LOW (ref >90)
Potassium: 4.2 mEq/L (ref 3.5–5.1)

## 2012-09-25 SURGERY — EGD (ESOPHAGOGASTRODUODENOSCOPY)
Anesthesia: Moderate Sedation

## 2012-09-25 MED ORDER — PEG 3350-KCL-NA BICARB-NACL 420 G PO SOLR
4000.0000 mL | Freq: Once | ORAL | Status: AC
  Administered 2012-09-25: 4000 mL via ORAL
  Filled 2012-09-25: qty 4000

## 2012-09-25 MED ORDER — MIDAZOLAM HCL 5 MG/ML IJ SOLN
INTRAMUSCULAR | Status: AC
  Filled 2012-09-25: qty 1

## 2012-09-25 MED ORDER — MIDAZOLAM HCL 10 MG/2ML IJ SOLN
INTRAMUSCULAR | Status: DC | PRN
  Administered 2012-09-25: 2 mg via INTRAVENOUS
  Administered 2012-09-25: 1 mg via INTRAVENOUS
  Administered 2012-09-25: 2 mg via INTRAVENOUS

## 2012-09-25 MED ORDER — DILTIAZEM HCL ER COATED BEADS 180 MG PO CP24
180.0000 mg | ORAL_CAPSULE | Freq: Every day | ORAL | Status: DC
  Administered 2012-09-25 – 2012-09-27 (×3): 180 mg via ORAL
  Filled 2012-09-25 (×3): qty 1

## 2012-09-25 MED ORDER — FENTANYL CITRATE 0.05 MG/ML IJ SOLN
INTRAMUSCULAR | Status: AC
  Filled 2012-09-25: qty 2

## 2012-09-25 MED ORDER — AMOXICILLIN-POT CLAVULANATE 875-125 MG PO TABS
1.0000 | ORAL_TABLET | Freq: Two times a day (BID) | ORAL | Status: DC
  Administered 2012-09-25 – 2012-09-29 (×9): 1 via ORAL
  Filled 2012-09-25 (×10): qty 1

## 2012-09-25 MED ORDER — DIPHENHYDRAMINE HCL 50 MG/ML IJ SOLN
INTRAMUSCULAR | Status: AC
  Filled 2012-09-25: qty 1

## 2012-09-25 MED ORDER — SODIUM CHLORIDE 0.9 % IV SOLN: INTRAVENOUS | Status: DC

## 2012-09-25 MED ORDER — BUTAMBEN-TETRACAINE-BENZOCAINE 2-2-14 % EX AERO
INHALATION_SPRAY | CUTANEOUS | Status: DC | PRN
  Administered 2012-09-25 (×2): 1 via TOPICAL

## 2012-09-25 MED ORDER — FENTANYL CITRATE 0.05 MG/ML IJ SOLN
INTRAMUSCULAR | Status: DC | PRN
  Administered 2012-09-25 (×3): 25 ug via INTRAVENOUS

## 2012-09-25 NOTE — Progress Notes (Signed)
     Patient: [name] [name] Date of Encounter: 09/25/2012, 7:44 AM Admit date: 09/06/2012     Subjective  Patient denies chest pain or dyspnea.  He is much improved.  GI workup in progress.   Objective  Physical Exam: Vitals: BP 135/80  Pulse 95  Temp 99.1 F (37.3 C) (Oral)  Resp 18  Ht 6\' 5"  (1.956 m)  Wt 240 lb 9.6 oz (109.135 kg)  BMI 28.53 kg/m2  SpO2 98% General: Well developed 56 year old male in no acute distress.  OP clear Neck: Supple.  Lungs: Mild basilar crackles Heart: Irregularly irregular S1 S2 without murmurs, rubs, or gallops.  Abdomen: Soft, non-distended. Extremities: No clubbing or cyanosis. Trace to 1+ ankle edema.   Neuro: Alert. Moves all extremities spontaneously. No focal deficits.  Intake/Output:  Intake/Output Summary (Last 24 hours) at 09/25/12 0744 Last data filed at 09/25/12 0458  Gross per 24 hour  Intake    963 ml  Output   1101 ml  Net   -138 ml    Inpatient Medications:  reviewed  Labs:  Basename 09/25/12 0530 09/24/12 0600  NA 134* 137  K 4.2 4.7  CL 102 105  CO2 25 24  GLUCOSE 107* 98  BUN 28* 29*  CREATININE 1.32 1.31  CALCIUM 9.0 9.2  MG -- --  PHOS -- --   No results found for this basename: AST:2,ALT:2,ALKPHOS:2,BILITOT:2,PROT:2,ALBUMIN:2 in the last 72 hours  Basename 09/25/12 0530 09/24/12 0600  WBC 8.3 8.6  NEUTROABS -- --  HGB 7.2* 7.4*  HCT 22.6* 23.0*  MCV 84.6 84.6  PLT 246 260    Radiology/Studies: Dg Chest Port 1 View  09/18/2012  *RADIOLOGY REPORT*  Clinical Data: Follow up infiltrate  PORTABLE CHEST - 1 VIEW  Comparison: 09/15/2012  Findings: Stable cardiomegaly.  Heterogeneous opacities throughout the right lung are stable.  Left subclavian AICD device unchanged. No pneumothorax.  Minimal airspace disease at the left base has nearly resolved.  IMPRESSION: Improved airspace disease at the left base.  Stable airspace disease throughout the right lung.   Original Report Authenticated By: Jolaine Click, M.D.    Dg Chest Port 1 View  09/15/2012  *RADIOLOGY REPORT*  Clinical Data: Respiratory failure  PORTABLE CHEST - 1 VIEW  Comparison: Yesterday  Findings: Stable cardiomegaly.    Stable patchy airspace disease throughout the right lung and left base.  No pneumothorax.  Stable left subclavian AICD device.  IMPRESSION: Stable bilateral airspace disease right greater than left.   Original Report Authenticated By: Jolaine Click, M.D.     Telemetry: atrial fibrillation, rate controlled    Assessment and Plan  1. Atrial fibrillation - rates controlled; continue metoprolol and diltiazem; suspect his rates will continue to improved/remain stable as his respiratory and hematologic status improve; no anticoagulation at this time due to anemia.  Will convert cardizem to a daily medicine and switch metoprolol to coreg.  Will try to wean diltiazem today 2. Acute on chronic systolic HF - Mild pedal edema on exam; continue present dose of lasix ; watch renal function. 3. PNA - per primary team 4. Anemia - per primary team and GI 5. Acute on chronic renal failure  Will need to follow-up with Gene Serpe in our [place] office 2 weeks post discharge Will see as needed going forward.  Please call with questions  Hillis Range MD 7:44 AM

## 2012-09-25 NOTE — Progress Notes (Signed)
Pt ambulated approx 500 feet with rolling walker. Pt was SOB but was able to make the whole walk without stopping. Pt is back in chair with call bell in reach.

## 2012-09-25 NOTE — Progress Notes (Signed)
Pt ambulated approx 400 feet with rolling walker and nursing student. Tolerated well. HR 130's while up and ambulating. While at rest pt's HR controlled in the 80's-90's. Pt back to bed will continue to monitor.

## 2012-09-25 NOTE — H&P (View-Only) (Signed)
[facility] PROGRESS NOTE  [name] [name] MRN:[id number] DOB: 11/10/1956 DOA: 09/06/2012 PCP: TAPPER,DAVID B, MD  Assessment/Plan: Active Problems:  Atrial fibrillation with RVR  Chronic systolic CHF (congestive heart failure), NYHA class 3  IMPLANTATION OF DEFIBRILLATOR, HX OF  Iron deficiency anemia, unspecified  CAP (community acquired pneumonia)  Cardiomyopathy, nonischemic  Hypertension  Acute respiratory failure with hypoxia  Warfarin-induced coagulopathy  Mild epistaxis  CKD (chronic kidney disease) stage 2, GFR 60-89 ml/min  Acute renal failure  Thrombocytopenia     1. Severe community-acquired pneumonia/Acute hypoxic respiratory failure:  Patient presented with chills, and a productive cough. CXR demonstrated bilateral patchy airspace opacities, right greater than left, and he had a leukocytosis of 12.0. He was managed initially, with iv rocephin/Azithromycin, but airspace disease progressed, he continued to spike pyrexia, and on 09/09/12, spiked as high as 103.5 degrees Farenheit, with a tachypnea of 30-40/min and Oxygen saturation in the 80s. Antibiotics were broadened to include Vancomycin and Cefepime, while Rocephin was discontinued. He was transferred to ICU on that date. Clinically, he has had a rather turbulent clinical course, but now appears to have turned the corner. Follow up CXR on 09/18/2012 was without significant improvement, but clinically, pneumonia has improved greatly, wcc had normalized at 8.5 as of 06/21/12, and he is now saturating at 98%-100% off oxygen. He did have an episode of aspiration/Pneumonitis on 09/14/2012, related to episode of epistaxis, but this has since resolved. SLP has cleared for diet and patient is tolerating a mechanical soft diet. Repeat CXR on 09/23/12 showed that right lower lobe air space disease may be slightly improved. There is new left lower lobe air space disease. It is clear that patient has recurrent silent aspiration. He  has been afebrile for the last 96 hours. Will treat with an additional 7 days of oral Augumentin. 2. Chronic systolic congestive heart failure/Non-ischemic cardiomyopathy:  Patient has a known history of chronic systolic CHF, nonischemic cardiomyopathy, cardiac arrest (1998), s/p AICD. He initially appeared compensated on admission, but became acutely dyspneic, on 09/07/12, after completion of blood transfusion. CXR confirmed volume overload, and patient was managed aggressively with iv Lasix. Rothbart provided cardiology consultation and ICD check was carried out by Dr James Allred, EPS, on 09/08/12. ICD interrogation was reviewed and declared normal. Cardiology team has continued to follow and have been very helpful in managing decompensated heart failure,in this complicated patient. As of 09/21/12, he appears clinically better compensated. Will need to follow-up with Gene Serpe in [place] [facility] [place] office 2 weeks post discharge 3. Atrial fibrillation:   On 09/08/12, patient went into fast atrial fibrillation, with HR in the 130s-140s. He was started on iv Cardizem, and iv Amiodarone was  Added on 09/09/12, because of unremitting tachycardia, after consultation with Cardiologist. Over the course of hospitalization, HR has improved, and rate-control has now been achieved with oral Cardizem and Lopressor. Cardiologist has changed to beta-blocker to Coreg on 09/22/12. Managing as recommended. 4. Hypertension:  BP is well controlled at this time, on diuretics, ACE-i, Cardizem and beta-blocker.   5. Hyperlipidemia: On statin.   6. Acute on chronic renal failure:   Baseline creatinine 1.2. Renal function appears stable at this time.  7. Anemia of chronic disease/ acute blood loss anemia due to epistaxis:   Severe, normocytic. Patient presented with a hemoglobin of 7.5, denied black stools, hematochezia or hematemesis. FOBT was negative, although hematinic studies have confirmed iron deficiency. He was  transfused 2 units PRBC on 09/07/12, with bump in HB   to 8.4, and was placed on empiric Protonix. Dr Patrick Hung provided GI consultation, and has opined that patient is high risk for EGD/Colonoscopy, given his comorbidities. Anticoagulation was placed on hold, against a background of supra-therapeutic INR. 5 mg Vit K was given iv on 09/07/12, and INR has since drifted down. An attempt to restart anticoagulation, resulted in epistaxis, with further worsening of anemia. He has had a total of 6 units PRBC so far, with the last 2, administered today, for a hemoglobin of 6.4. Per Dr Rothbart, patient is at substantial risk for thrombembolism, so the matter has to be revisited. A GI evaluation is would be helpful if feasible. As patient is clinically more stable at this time, Dr Robert Kaplan, gastroenterologist, was consulted on 09/24/12, and EGD is scheduled for today.  8. Warfarin-induced Coagulopathy:  See above discussion.  9. Suspected sleep apnea:   Patient may benefit from outpatient sleep study.     Code Status: Full Code.  Family Communication:  Disposition Plan: To be determined.    Brief narrative: Brief narrative:  56-year-old patient with long-standing chronic severe systolic dysfunction with an EF of around 35%. He presented on 09/06/2012 with complaints of dyspnea, cough with productive sputum, fevers and chills felt to be related to pneumonia. He was also found to be anemic. Previous hemoglobin in 2008 was 13 and was down to just above 8. He had persistent fevers and worsening respiratory status after admission so was transferred to the ICU on 09/09/2012. PCCM assumed care of the patient because of respiratory difficulties. Since admission patient developed issues with atrial fibrillation and RVR that required cardiology consultation and the patient rate had been stabilized on diltiazem drip and he had subsequently been converted to oral Cardizem. He also continued to have respiratory  problems that were associated with superimposed heart failure presumed secondary to stressors of chronic anemia and pneumonia. Because of this cardiology has started the patient on a milrinone infusion. [facility] reassumed care of this patient on 09/14/2012 but he once again decompensated from a respiratory standpoint and required transfer back to the ICU on the same date. It was felt his respiratory symptoms were related to aspiration from recent severe epistaxis. He subsequently restarted her eyes to it was deemed once again appropriate to transfer to the step down unit.   Consultants:  Dr Robert Rothbart, cardiologist.  Dr Patrick Hung, GI.  Dr Domenick Roma, PCCM.   Procedures:  Serial CXRs.   Antibiotics:  Cefepime 09/10/12>>>  Vancomycin 09/10/12-09/14/12  HPI/Subjective: No new issues.   Objective: Vital signs in last 24 hours: Temp:  [98.7 F (37.1 C)-99.2 F (37.3 C)] 99.1 F (37.3 C) (11/11 0457) Pulse Rate:  [85-98] 95  (11/11 0457) Resp:  [18-20] 18  (11/11 0457) BP: (115-145)/(67-80) 135/80 mmHg (11/11 0457) SpO2:  [98 %-100 %] 98 % (11/11 0457) Weight:  [109.135 kg (240 lb 9.6 oz)] 109.135 kg (240 lb 9.6 oz) (11/11 0457) Weight change: -0.544 kg (-1 lb 3.2 oz) Last BM Date: 09/24/12  Intake/Output from previous day: 11/10 0701 - 11/11 0700 In: 963 [P.O.:960; I.V.:3] Out: 1101 [Urine:1100; Stool:1] Total I/O In: -  Out: 225 [Urine:225]   Physical Exam: General: Alert, communicative, fully oriented, not short of breath at rest.  HEENT: Moderate clinical pallor, no jaundice, no conjunctival injection or discharge.  NECK: Supple, JVP not seen, no carotid bruits, no palpable lymphadenopathy, no palpable goiter.  CHEST: Clinically clear to auscultation.  HEART: Sounds 1 and 2 heard,   normal, irregular, no murmurs.  ABDOMEN: Full, soft, no scars, non-tender, no palpable organomegaly, no palpable masses, normal bowel sounds.  GENITALIA: Not examined.    LOWER EXTREMITIES: No pitting edema, palpable peripheral pulses.  MUSCULOSKELETAL SYSTEM: Unremarkable.  CENTRAL NERVOUS SYSTEM: No focal neurologic deficit on gross examination.  Lab Results:  Basename 09/25/12 0530 09/24/12 0600  WBC 8.3 8.6  HGB 7.2* 7.4*  HCT 22.6* 23.0*  PLT 246 260    Basename 09/25/12 0530 09/24/12 0600  NA 134* 137  K 4.2 4.7  CL 102 105  CO2 25 24  GLUCOSE 107* 98  BUN 28* 29*  CREATININE 1.32 1.31  CALCIUM 9.0 9.2   Recent Results (from the past 240 hour(s))  CULTURE, EXPECTORATED SPUTUM-ASSESSMENT     Status: Normal   Collection Time   09/19/12 11:30 PM      Component Value Range Status Comment   Specimen Description SPUTUM   Final    Special Requests NONE   Final    Sputum evaluation     Final    Value: THIS SPECIMEN IS ACCEPTABLE. RESPIRATORY CULTURE REPORT TO FOLLOW.   Report Status 09/20/2012 FINAL   Final   CULTURE, RESPIRATORY     Status: Normal   Collection Time   09/19/12 11:30 PM      Component Value Range Status Comment   Specimen Description SPUTUM   Final    Special Requests NONE   Final    Gram Stain     Final    Value: ABUNDANT WBC PRESENT, PREDOMINANTLY PMN     NO SQUAMOUS EPITHELIAL CELLS SEEN     NO ORGANISMS SEEN   Culture FEW CANDIDA ALBICANS   Final    Report Status 09/22/2012 FINAL   Final   CULTURE, BLOOD (ROUTINE X 2)     Status: Normal (Preliminary result)   Collection Time   09/20/12  4:58 PM      Component Value Range Status Comment   Specimen Description BLOOD LEFT HAND   Final    Special Requests BOTTLES DRAWN AEROBIC ONLY 5CC   Final    Culture  Setup Time 09/20/2012 23:51   Final    Culture     Final    Value:        BLOOD CULTURE RECEIVED NO GROWTH TO DATE CULTURE WILL BE HELD FOR 5 DAYS BEFORE ISSUING A FINAL NEGATIVE REPORT   Report Status PENDING   Incomplete   CULTURE, BLOOD (ROUTINE X 2)     Status: Normal (Preliminary result)   Collection Time   09/20/12  5:05 PM      Component Value Range Status  Comment   Specimen Description BLOOD RIGHT ARM   Final    Special Requests BOTTLES DRAWN AEROBIC AND ANAEROBIC 10CC   Final    Culture  Setup Time 09/20/2012 23:50   Final    Culture     Final    Value:        BLOOD CULTURE RECEIVED NO GROWTH TO DATE CULTURE WILL BE HELD FOR 5 DAYS BEFORE ISSUING A FINAL NEGATIVE REPORT   Report Status PENDING   Incomplete      Studies/Results: No results found.  Medications: Scheduled Meds:    . carvedilol  12.5 mg Oral BID WC  . diltiazem  180 mg Oral Daily  . feeding supplement  237 mL Oral Q24H  . ferrous sulfate  325 mg Oral BID WC  . furosemide  40 mg Oral Daily  .   isosorbide mononitrate  60 mg Oral Daily  . lisinopril  5 mg Oral Daily  . pantoprazole  40 mg Oral Q1200  . pravastatin  40 mg Oral QPC supper  . sodium chloride  3 mL Intravenous Q12H  . [DISCONTINUED] diltiazem  240 mg Oral Daily   Continuous Infusions:    . sodium chloride 10 mL/hr at 09/24/12 1330   PRN Meds:.sodium chloride, acetaminophen, ibuprofen, levalbuterol, oxyCODONE, sodium chloride    LOS: 19 days   Mintie Witherington,CHRISTOPHER  [facility] Pager [phone]. If 8PM-8AM, please contact night-coverage at [email or web address], password [email or web address] 09/25/2012, 11:05 AM  LOS: 19 days     

## 2012-09-25 NOTE — Interval H&P Note (Signed)
History and Physical Interval Note:  09/25/2012 12:16 PM  [name] [name]  has presented today for surgery, with the diagnosis of anemia  The various methods of treatment have been discussed with the patient and family. After consideration of risks, benefits and other options for treatment, the patient has consented to  Procedure(s) (LRB) with comments: ESOPHAGOGASTRODUODENOSCOPY (EGD) (N/A) as a surgical intervention .  The patient's history has been reviewed, patient examined, no change in status, stable for surgery.  I have reviewed the patient's chart and labs.  Questions were answered to the patient's satisfaction.     Alvan Culpepper D

## 2012-09-25 NOTE — Op Note (Signed)
Eligha Bridegroom [facility] [address] [place], [address]   OPERATIVE PROCEDURE REPORT  PATIENT: [name] [name], [name] [name]  MR#: [id number] BIRTHDATE: 1956-08-04  GENDER: Male ENDOSCOPIST: Jeani Hawking, MD ASSISTANT:   Oletha Blend, technician and Kevin Fenton, RN PROCEDURE DATE: 09/25/2012 PROCEDURE:   EGD, diagnostic ASA CLASS:   Class III INDICATIONS:anemia. MEDICATIONS: Versed 5 mg IV and Fentanyl 75 mcg IV TOPICAL ANESTHETIC:  DESCRIPTION OF PROCEDURE:   After the risks benefits and alternatives of the procedure were thoroughly explained, informed consent was obtained.  The Pentax Gastroscope [id number]  endoscope was introduced through the mouth  and advanced to the second portion of the duodenum Without limitations.      The instrument was slowly withdrawn as the mucosa was fully examined.    FINDINGS: A 2-3 cm sliding hiatal hernia was identified.  No evidence of any ulcers, erosions, masses, or polyps.  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   Retroflexed views revealed no abnormalities.     The scope was then withdrawn from the patient and the procedure terminated.  COMPLICATIONS: There were no complications.  IMPRESSION: 1) Hiatal hernia. 2) Nonbleeding small duodenal AVM.Marland Kitchen  RECOMMENDATIONS:1) Colonoscopy tomorrow.   _______________________________ eSignedJeani Hawking, MD 09/25/2012 1:24 PM

## 2012-09-25 NOTE — Progress Notes (Signed)
Patient given Heart Failure education booklet. Will continue to monitor.

## 2012-09-25 NOTE — Progress Notes (Signed)
[facility] PROGRESS NOTE  KHOLE BRANCH ZOX:[id number] DOB: 06-25-1956 DOA: 09/06/2012 PCP: [name], MD  Assessment/Plan: Active Problems:  Atrial fibrillation with RVR  Chronic systolic CHF (congestive heart failure), NYHA class 3  IMPLANTATION OF DEFIBRILLATOR, HX OF  Iron deficiency anemia, unspecified  CAP (community acquired pneumonia)  Cardiomyopathy, nonischemic  Hypertension  Acute respiratory failure with hypoxia  Warfarin-induced coagulopathy  Mild epistaxis  CKD (chronic kidney disease) stage 2, GFR 60-89 ml/min  Acute renal failure  Thrombocytopenia     1. Severe community-acquired pneumonia/Acute hypoxic respiratory failure:  Patient presented with chills, and a productive cough. CXR demonstrated bilateral patchy airspace opacities, right greater than left, and he had a leukocytosis of 12.0. He was managed initially, with iv rocephin/Azithromycin, but airspace disease progressed, he continued to spike pyrexia, and on 09/09/12, spiked as high as 103.5 degrees Farenheit, with a tachypnea of 30-40/min and Oxygen saturation in the 80s. Antibiotics were broadened to include Vancomycin and Cefepime, while Rocephin was discontinued. He was transferred to ICU on that date. Clinically, he has had a rather turbulent clinical course, but now appears to have turned the corner. Follow up CXR on 09/18/2012 was without significant improvement, but clinically, pneumonia has improved greatly, wcc had normalized at 8.5 as of 06/21/12, and he is now saturating at 98%-100% off oxygen. He did have an episode of aspiration/Pneumonitis on 09/14/2012, related to episode of epistaxis, but this has since resolved. SLP has cleared for diet and patient is tolerating a mechanical soft diet. Repeat CXR on 09/23/12 showed that right lower lobe air space disease may be slightly improved. There is new left lower lobe air space disease. It is clear that patient has recurrent silent aspiration. He  has been afebrile for the last 96 hours. Will treat with an additional 7 days of oral Augumentin. 2. Chronic systolic congestive heart failure/Non-ischemic cardiomyopathy:  Patient has a known history of chronic systolic CHF, nonischemic cardiomyopathy, cardiac arrest (1998), s/p AICD. He initially appeared compensated on admission, but became acutely dyspneic, on 09/07/12, after completion of blood transfusion. CXR confirmed volume overload, and patient was managed aggressively with iv Lasix. Rothbart provided cardiology consultation and ICD check was carried out by Dr Hillis Range, EPS, on 09/08/12. ICD interrogation was reviewed and declared normal. Cardiology team has continued to follow and have been very helpful in managing decompensated heart failure,in this complicated patient. As of 09/21/12, he appears clinically better compensated. Will need to follow-up with Gene Serpe in [facility] cardiology [place] office 2 weeks post discharge 3. Atrial fibrillation:   On 09/08/12, patient went into fast atrial fibrillation, with HR in the 130s-140s. He was started on iv Cardizem, and iv Amiodarone was  Added on 09/09/12, because of unremitting tachycardia, after consultation with Cardiologist. Over the course of hospitalization, HR has improved, and rate-control has now been achieved with oral Cardizem and Lopressor. Cardiologist has changed to beta-blocker to Coreg on 09/22/12. Managing as recommended. 4. Hypertension:  BP is well controlled at this time, on diuretics, ACE-i, Cardizem and beta-blocker.   5. Hyperlipidemia: On statin.   6. Acute on chronic renal failure:   Baseline creatinine 1.2. Renal function appears stable at this time.  7. Anemia of chronic disease/ acute blood loss anemia due to epistaxis:   Severe, normocytic. Patient presented with a hemoglobin of 7.5, denied black stools, hematochezia or hematemesis. FOBT was negative, although hematinic studies have confirmed iron deficiency. He was  transfused 2 units PRBC on 09/07/12, with bump in HB  to 8.4, and was placed on empiric Protonix. Dr Jeani Hawking provided GI consultation, and has opined that patient is high risk for EGD/Colonoscopy, given his comorbidities. Anticoagulation was placed on hold, against a background of supra-therapeutic INR. 5 mg Vit K was given iv on 09/07/12, and INR has since drifted down. An attempt to restart anticoagulation, resulted in epistaxis, with further worsening of anemia. He has had a total of 6 units PRBC so far, with the last 2, administered today, for a hemoglobin of 6.4. Per Dr Dietrich Pates, patient is at substantial risk for thrombembolism, so the matter has to be revisited. A GI evaluation is would be helpful if feasible. As patient is clinically more stable at this time, Dr Melvia Heaps, gastroenterologist, was consulted on 09/24/12, and EGD is scheduled for today.  8. Warfarin-induced Coagulopathy:  See above discussion.  9. Suspected sleep apnea:   Patient may benefit from outpatient sleep study.     Code Status: Full Code.  Family Communication:  Disposition Plan: To be determined.    Brief narrative: Brief narrative:  56 year old patient with long-standing chronic severe systolic dysfunction with an EF of around 35%. He presented on 09/06/2012 with complaints of dyspnea, cough with productive sputum, fevers and chills felt to be related to pneumonia. He was also found to be anemic. Previous hemoglobin in 2008 was 13 and was down to just above 8. He had persistent fevers and worsening respiratory status after admission so was transferred to the ICU on 09/09/2012. PCCM assumed care of the patient because of respiratory difficulties. Since admission patient developed issues with atrial fibrillation and RVR that required cardiology consultation and the patient rate had been stabilized on diltiazem drip and he had subsequently been converted to oral Cardizem. He also continued to have respiratory  problems that were associated with superimposed heart failure presumed secondary to stressors of chronic anemia and pneumonia. Because of this cardiology has started the patient on a milrinone infusion. [facility] reassumed care of this patient on 09/14/2012 but he once again decompensated from a respiratory standpoint and required transfer back to the ICU on the same date. It was felt his respiratory symptoms were related to aspiration from recent severe epistaxis. He subsequently restarted her eyes to it was deemed once again appropriate to transfer to the step down unit.   Consultants:  Dr [place] Bing, cardiologist.  Dr Jeani Hawking, GI.  Dr Wynelle Cleveland Roma, PCCM.   Procedures:  Serial CXRs.   Antibiotics:  Cefepime 09/10/12>>>  Vancomycin 09/10/12-09/14/12  HPI/Subjective: No new issues.   Objective: Vital signs in last 24 hours: Temp:  [98.7 F (37.1 C)-99.2 F (37.3 C)] 99.1 F (37.3 C) (11/11 0457) Pulse Rate:  [85-98] 95  (11/11 0457) Resp:  [18-20] 18  (11/11 0457) BP: (115-145)/(67-80) 135/80 mmHg (11/11 0457) SpO2:  [98 %-100 %] 98 % (11/11 0457) Weight:  [109.135 kg (240 lb 9.6 oz)] 109.135 kg (240 lb 9.6 oz) (11/11 0457) Weight change: -0.544 kg (-1 lb 3.2 oz) Last BM Date: 09/24/12  Intake/Output from previous day: 11/10 0701 - 11/11 0700 In: 963 [P.O.:960; I.V.:3] Out: 1101 [Urine:1100; Stool:1] Total I/O In: -  Out: 225 [Urine:225]   Physical Exam: General: Alert, communicative, fully oriented, not short of breath at rest.  HEENT: Moderate clinical pallor, no jaundice, no conjunctival injection or discharge.  NECK: Supple, JVP not seen, no carotid bruits, no palpable lymphadenopathy, no palpable goiter.  CHEST: Clinically clear to auscultation.  HEART: Sounds 1 and 2 heard,  normal, irregular, no murmurs.  ABDOMEN: Full, soft, no scars, non-tender, no palpable organomegaly, no palpable masses, normal bowel sounds.  GENITALIA: Not examined.    LOWER EXTREMITIES: No pitting edema, palpable peripheral pulses.  MUSCULOSKELETAL SYSTEM: Unremarkable.  CENTRAL NERVOUS SYSTEM: No focal neurologic deficit on gross examination.  Lab Results:  Basename 09/25/12 0530 09/24/12 0600  WBC 8.3 8.6  HGB 7.2* 7.4*  HCT 22.6* 23.0*  PLT 246 260    Basename 09/25/12 0530 09/24/12 0600  NA 134* 137  K 4.2 4.7  CL 102 105  CO2 25 24  GLUCOSE 107* 98  BUN 28* 29*  CREATININE 1.32 1.31  CALCIUM 9.0 9.2   Recent Results (from the past 240 hour(s))  CULTURE, EXPECTORATED SPUTUM-ASSESSMENT     Status: Normal   Collection Time   09/19/12 11:30 PM      Component Value Range Status Comment   Specimen Description SPUTUM   Final    Special Requests NONE   Final    Sputum evaluation     Final    Value: THIS SPECIMEN IS ACCEPTABLE. RESPIRATORY CULTURE REPORT TO FOLLOW.   Report Status 09/20/2012 FINAL   Final   CULTURE, RESPIRATORY     Status: Normal   Collection Time   09/19/12 11:30 PM      Component Value Range Status Comment   Specimen Description SPUTUM   Final    Special Requests NONE   Final    Gram Stain     Final    Value: ABUNDANT WBC PRESENT, PREDOMINANTLY PMN     NO SQUAMOUS EPITHELIAL CELLS SEEN     NO ORGANISMS SEEN   Culture FEW CANDIDA ALBICANS   Final    Report Status 09/22/2012 FINAL   Final   CULTURE, BLOOD (ROUTINE X 2)     Status: Normal (Preliminary result)   Collection Time   09/20/12  4:58 PM      Component Value Range Status Comment   Specimen Description BLOOD LEFT HAND   Final    Special Requests BOTTLES DRAWN AEROBIC ONLY 5CC   Final    Culture  Setup Time 09/20/2012 23:51   Final    Culture     Final    Value:        BLOOD CULTURE RECEIVED NO GROWTH TO DATE CULTURE WILL BE HELD FOR 5 DAYS BEFORE ISSUING A FINAL NEGATIVE REPORT   Report Status PENDING   Incomplete   CULTURE, BLOOD (ROUTINE X 2)     Status: Normal (Preliminary result)   Collection Time   09/20/12  5:05 PM      Component Value Range Status  Comment   Specimen Description BLOOD RIGHT ARM   Final    Special Requests BOTTLES DRAWN AEROBIC AND ANAEROBIC 10CC   Final    Culture  Setup Time 09/20/2012 23:50   Final    Culture     Final    Value:        BLOOD CULTURE RECEIVED NO GROWTH TO DATE CULTURE WILL BE HELD FOR 5 DAYS BEFORE ISSUING A FINAL NEGATIVE REPORT   Report Status PENDING   Incomplete      Studies/Results: No results found.  Medications: Scheduled Meds:    . carvedilol  12.5 mg Oral BID WC  . diltiazem  180 mg Oral Daily  . feeding supplement  237 mL Oral Q24H  . ferrous sulfate  325 mg Oral BID WC  . furosemide  40 mg Oral Daily  .  isosorbide mononitrate  60 mg Oral Daily  . lisinopril  5 mg Oral Daily  . pantoprazole  40 mg Oral Q1200  . pravastatin  40 mg Oral QPC supper  . sodium chloride  3 mL Intravenous Q12H  . [DISCONTINUED] diltiazem  240 mg Oral Daily   Continuous Infusions:    . sodium chloride 10 mL/hr at 09/24/12 1330   PRN Meds:.sodium chloride, acetaminophen, ibuprofen, levalbuterol, oxyCODONE, sodium chloride    LOS: 19 days   Avonelle Viveros,CHRISTOPHER  [facility] Pager [phone]. If 8PM-8AM, please contact night-coverage at [email or web address], password [facility] 09/25/2012, 11:05 AM  LOS: 19 days

## 2012-09-26 ENCOUNTER — Encounter (HOSPITAL_COMMUNITY)
Admission: EM | Disposition: A | Discharge status: Skilled Nursing Facility | Payer: Self-pay | Source: Home / Self Care | Attending: Internal Medicine

## 2012-09-26 ENCOUNTER — Encounter (HOSPITAL_COMMUNITY): Payer: Self-pay | Admitting: Gastroenterology

## 2012-09-26 HISTORY — PX: COLONOSCOPY: SHX5424

## 2012-09-26 LAB — CBC
HCT: 22.6 % — ABNORMAL LOW (ref 39.0–52.0)
MCV: 85.9 fL (ref 78.0–100.0)
RDW: 15.9 % — ABNORMAL HIGH (ref 11.5–15.5)
WBC: 9.4 10*3/uL (ref 4.0–10.5)

## 2012-09-26 LAB — BASIC METABOLIC PANEL
BUN: 23 mg/dL (ref 6–23)
CO2: 25 mEq/L (ref 19–32)
Chloride: 100 mEq/L (ref 96–112)
Creatinine, Ser: 1.34 mg/dL (ref 0.50–1.35)
GFR calc Af Amer: 68 mL/min — ABNORMAL LOW (ref >90)

## 2012-09-26 LAB — CULTURE, BLOOD (ROUTINE X 2)

## 2012-09-26 SURGERY — IMAGING PROCEDURE, GI TRACT, INTRALUMINAL, VIA CAPSULE
Anesthesia: LOCAL

## 2012-09-26 SURGERY — COLONOSCOPY
Anesthesia: Moderate Sedation

## 2012-09-26 MED ORDER — SODIUM CHLORIDE 0.9 % IV SOLN: INTRAVENOUS | Status: DC

## 2012-09-26 MED ORDER — FENTANYL CITRATE 0.05 MG/ML IJ SOLN
INTRAMUSCULAR | Status: DC | PRN
  Administered 2012-09-26 (×4): 25 ug via INTRAVENOUS

## 2012-09-26 MED ORDER — DIPHENHYDRAMINE HCL 50 MG/ML IJ SOLN
INTRAMUSCULAR | Status: AC
  Filled 2012-09-26: qty 1

## 2012-09-26 MED ORDER — MIDAZOLAM HCL 5 MG/5ML IJ SOLN
INTRAMUSCULAR | Status: DC | PRN
  Administered 2012-09-26 (×4): 2 mg via INTRAVENOUS

## 2012-09-26 MED ORDER — DIPHENHYDRAMINE HCL 50 MG/ML IJ SOLN
INTRAMUSCULAR | Status: DC | PRN
  Administered 2012-09-26: 25 mg via INTRAVENOUS

## 2012-09-26 MED ORDER — FENTANYL CITRATE 0.05 MG/ML IJ SOLN
INTRAMUSCULAR | Status: AC
  Filled 2012-09-26: qty 2

## 2012-09-26 MED ORDER — POLYETHYLENE GLYCOL 3350 17 GM/SCOOP PO POWD
1.0000 | Freq: Once | ORAL | Status: AC
  Administered 2012-09-26: 1 via ORAL
  Filled 2012-09-26: qty 255

## 2012-09-26 MED ORDER — MIDAZOLAM HCL 5 MG/ML IJ SOLN
INTRAMUSCULAR | Status: AC
  Filled 2012-09-26: qty 3

## 2012-09-26 NOTE — Progress Notes (Signed)
Physical Therapy Treatment Patient Details Name: JAYMIAN SUGIMOTO MRN: [id number] DOB: Feb 27, 1956 Today's Date: 09/26/2012 Time: 9629-5284 PT Time Calculation (min): 29 min  PT Assessment / Plan / Recommendation Comments on Treatment Session  Improved mobility and balance with tasks, but pts HR somewhat labile and showed tachy with ambulation to max of 173 bpm with mild symptoms  Treatment continued at sink with ADL;s while monitoring HR more closely.    Follow Up Recommendations  SNF     Does the patient have the potential to tolerate intense rehabilitation     Barriers to Discharge        Equipment Recommendations  None recommended by OT    Recommendations for Other Services    Frequency Min 3X/week   Plan Discharge plan remains appropriate;Frequency remains appropriate    Precautions / Restrictions Precautions Precautions: Fall Restrictions Weight Bearing Restrictions: No   Pertinent Vitals/Pain Other than Max 173bpm where ambulation was aborted, all other vitals in acceptable range during ADL/activity--HR mid 80's and SaO2 ~95% RA    Mobility  Bed Mobility Bed Mobility: Not assessed Transfers Transfers: Sit to Stand;Stand to Sit Sit to Stand: 4: Min guard;Without upper extremity assist;From chair/3-in-1 Stand to Sit: 4: Min guard;To chair/3-in-1 Details for Transfer Assistance: Vc's for safe hand placement Ambulation/Gait Ambulation/Gait Assistance: 4: Min assist Ambulation Distance (Feet): 110 Feet (limited by tachy at 173 bpm) Assistive device: Rolling walker Ambulation/Gait Assistance Details: mildly unsteady, keeps the RW to far in front of him, flexed posture Gait Pattern: Step-through pattern;Decreased stride length;Trunk flexed Stairs: No Wheelchair Mobility Wheelchair Mobility: No    Exercises     PT Diagnosis:    PT Problem List:   PT Treatment Interventions:     PT Goals Acute Rehab PT Goals Time For Goal Achievement: 09/27/12 Potential to  Achieve Goals: Good PT Goal: Supine/Side to Sit - Progress: Progressing toward goal PT Goal: Sit to Supine/Side - Progress: Progressing toward goal PT Goal: Sit to Stand - Progress: Progressing toward goal PT Goal: Stand to Sit - Progress: Progressing toward goal PT Goal: Ambulate - Progress: Progressing toward goal  Visit Information  Last PT Received On: 09/26/12 Assistance Needed: +1    Subjective Data  Subjective: No, I feel okay, but I haven't bounced back for the high HR yet   Cognition  Overall Cognitive Status: Appears within functional limits for tasks assessed/performed Arousal/Alertness: Awake/alert Orientation Level: Appears intact for tasks assessed Behavior During Session: [facility] for tasks performed Current Attention Level: Sustained    Balance  Balance Balance Assessed: Yes Static Sitting Balance Static Sitting - Balance Support: No upper extremity supported Static Sitting - Level of Assistance: 5: Stand by assistance Static Standing Balance Static Standing - Balance Support: No upper extremity supported Static Standing - Level of Assistance: 5: Stand by assistance Static Standing - Comment/# of Minutes: pt stood at sink to do a couple of endurance tasks.  Balance maintained with L UE assist Dynamic Standing Balance Dynamic Standing - Balance Support: Right upper extremity supported;Left upper extremity supported Dynamic Standing - Level of Assistance: 5: Stand by assistance  End of Session PT - End of Session Activity Tolerance: Patient tolerated treatment well Patient left: in chair;with call bell/phone within reach Nurse Communication: Mobility status   GP     Watson Robarge, Eliseo Gum 09/26/2012, 2:46 PM

## 2012-09-26 NOTE — Interval H&P Note (Signed)
History and Physical Interval Note:  09/26/2012 2:49 PM  [name] [name]  has presented today for surgery, with the diagnosis of Anemia  The various methods of treatment have been discussed with the patient and family. After consideration of risks, benefits and other options for treatment, the patient has consented to  Procedure(s) (LRB) with comments: COLONOSCOPY (N/A) as a surgical intervention .  The patient's history has been reviewed, patient examined, no change in status, stable for surgery.  I have reviewed the patient's chart and labs.  Questions were answered to the patient's satisfaction.     Rejeana Fadness D

## 2012-09-26 NOTE — Progress Notes (Signed)
Occupational Therapy Treatment Patient Details Name: [name] [name] MRN: [id number] DOB: 1956/02/21 Today's Date: 09/26/2012 Time: 9528-4132 OT Time Calculation (min): 29 min  OT Assessment / Plan / Recommendation Comments on Treatment Session Pt improving greatly with selfcare tasks.  Pt overall min guard to supervision for simulated selfcare activities.  Still with limited enduance during session and variable HR from 65 to 96 during grooming tasks at the sink.      Follow Up Recommendations  SNF       Equipment Recommendations  None recommended by OT       Frequency Min 2X/week   Plan Discharge plan remains appropriate    Precautions / Restrictions Precautions Precautions: Fall Restrictions Weight Bearing Restrictions: No   Pertinent Vitals/Pain No report of pain, O2 94 % and HR from 69 to 95 BPM    ADL  Grooming: Performed;Shaving;Teeth care;Supervision/safety Where Assessed - Grooming: Unsupported standing Lower Body Dressing: Simulated;Supervision/safety Where Assessed - Lower Body Dressing: Unsupported sit to stand Toilet Transfer: Performed;Min guard Statistician Method: Surveyor, minerals: Materials engineer and Hygiene: Min guard Where Assessed - Engineer, mining and Hygiene: Sit to stand from 3-in-1 or toilet Transfers/Ambulation Related to ADLs: Pt ambulated with the RW in the room with min guard assist.   ADL Comments: Pt able to toelrate standing for 3 min intervals at the sink during grooming tasks.  HR remained at 95 or below during this activity with O2 sats decreased to 94%.       OT Goals Acute Rehab OT Goals OT Goal Formulation: With patient Time For Goal Achievement: 10/10/12 ADL Goals ADL Goal: Eating - Progress: Met Pt Will Perform Grooming: with modified independence;Standing at sink ADL Goal: Grooming - Progress: Updated due to goal met Pt Will Perform Upper Body Bathing:  with modified independence;Sit to stand from chair;Sit to stand from bed;Unsupported ADL Goal: Upper Body Bathing - Progress: Goal set today Pt Will Perform Lower Body Bathing: with modified independence;Sit to stand from bed;Unsupported ADL Goal: Lower Body Bathing - Progress: Goal set today Pt Will Perform Upper Body Dressing: with modified independence;Unsupported;Sitting, chair;Sitting, bed ADL Goal: Upper Body Dressing - Progress: Goal set today Pt Will Perform Lower Body Dressing: with modified independence;Sit to stand from bed;Sit to stand from chair ADL Goal: Lower Body Dressing - Progress: Goal set today Pt Will Transfer to Toilet: with modified independence;with DME;3-in-1;Ambulation ADL Goal: Toilet Transfer - Progress: Goal set today Pt Will Perform Toileting - Clothing Manipulation: with modified independence;Sitting on 3-in-1 or toilet;Standing ADL Goal: Toileting - Clothing Manipulation - Progress: Goal set today Pt Will Perform Toileting - Hygiene: with modified independence;Sit to stand from 3-in-1/toilet ADL Goal: Toileting - Hygiene - Progress: Goal set today Miscellaneous OT Goals OT Goal: Miscellaneous Goal #1 - Progress: Discontinued (comment)  Visit Information  Last OT Received On: 09/26/12 Assistance Needed: +1 PT/OT Co-Evaluation/Treatment: Yes    Subjective Data  Subjective: "I don't know why my heart got up so high." Patient Stated Goal: To continue to strengthen his legs.      Cognition  Overall Cognitive Status: Appears within functional limits for tasks assessed/performed Arousal/Alertness: Awake/alert Orientation Level: Appears intact for tasks assessed Behavior During Session: [facility] for tasks performed Current Attention Level: Sustained    Mobility  Shoulder Instructions Bed Mobility Bed Mobility: Not assessed Transfers Transfers: Sit to Stand Sit to Stand: 4: Min guard;Without upper extremity assist;From chair/3-in-1 Stand to Sit: 4: Min  guard;To chair/3-in-1 Details  for Transfer Assistance: Vc's for safe hand placement          Balance Balance Balance Assessed: Yes Static Sitting Balance Static Sitting - Balance Support: No upper extremity supported Static Sitting - Level of Assistance: 5: Stand by assistance Static Standing Balance Static Standing - Balance Support: No upper extremity supported Static Standing - Level of Assistance: 5: Stand by assistance Static Standing - Comment/# of Minutes: pt stood at sink to do a couple of endurance tasks.  Balance maintained with L UE assist Dynamic Standing Balance Dynamic Standing - Balance Support: Right upper extremity supported;Left upper extremity supported Dynamic Standing - Level of Assistance: 5: Stand by assistance   End of Session OT - End of Session Activity Tolerance: Patient limited by fatigue Patient left: in chair;with call bell/phone within reach Nurse Communication: Mobility status     Mavery Milling,JAMESOTR/L Pager number [phone] 09/26/2012, 2:47 PM

## 2012-09-26 NOTE — Progress Notes (Signed)
[facility] PROGRESS NOTE  [name] [name] NWG:[id number] DOB: Aug 05, 1956 DOA: 09/06/2012 PCP: [name], MD  Assessment/Plan: Active Problems:  Atrial fibrillation with RVR  Chronic systolic CHF (congestive heart failure), NYHA class 3  IMPLANTATION OF DEFIBRILLATOR, HX OF  Iron deficiency anemia, unspecified  CAP (community acquired pneumonia)  Cardiomyopathy, nonischemic  Hypertension  Acute respiratory failure with hypoxia  Warfarin-induced coagulopathy  Mild epistaxis  CKD (chronic kidney disease) stage 2, GFR 60-89 ml/min  Acute renal failure  Thrombocytopenia     1. Severe community-acquired pneumonia/Acute hypoxic respiratory failure:  Patient presented with chills, and a productive cough. CXR demonstrated bilateral patchy airspace opacities, right greater than left, and he had a leukocytosis of 12.0. He was managed initially, with iv rocephin/Azithromycin, but airspace disease progressed, he continued to spike pyrexia, and on 09/09/12, spiked as high as 103.5 degrees Farenheit, with a tachypnea of 30-40/min and Oxygen saturation in the 80s. Antibiotics were broadened to include Vancomycin and Cefepime, while Rocephin was discontinued. He was transferred to ICU on that date. Clinically, he has had a rather turbulent clinical course, but now appears to have turned the corner. Follow up CXR on 09/18/2012 was without significant improvement, but clinically, pneumonia has improved greatly, wcc had normalized at 8.5 as of 06/21/12, and he is now saturating at 98%-100% off oxygen. He did have an episode of aspiration/Pneumonitis on 09/14/2012, related to episode of epistaxis, but this has since resolved. SLP has cleared for diet and patient is tolerating a mechanical soft diet. Repeat CXR on 09/23/12 showed that right lower lobe air space disease may be slightly improved. There is new left lower lobe air space disease. It is clear that patient has recurrent silent aspiration. He  has been afebrile since 09/23/12. Will treat with an additional 7 days of oral Augumentin, to be concluded on 10/01/12. 2. Chronic systolic congestive heart failure/Non-ischemic cardiomyopathy:  Patient has a known history of chronic systolic CHF, nonischemic cardiomyopathy, cardiac arrest (1998), s/p AICD. He initially appeared compensated on admission, but became acutely dyspneic, on 09/07/12, after completion of blood transfusion. CXR confirmed volume overload, and patient was managed aggressively with iv Lasix. Rothbart provided cardiology consultation and ICD check was carried out by Dr Hillis Range, EPS, on 09/08/12. ICD interrogation was reviewed and declared normal. Cardiology team has continued to follow and have been very helpful in managing decompensated heart failure,in this complicated patient. As of 09/21/12, he appears clinically better compensated. Will need to follow-up with Gene Serpe in [facility] cardiology [place] office 2 weeks post discharge 3. Atrial fibrillation:   On 09/08/12, patient went into fast atrial fibrillation, with HR in the 130s-140s. He was started on iv Cardizem, and iv Amiodarone was  Added on 09/09/12, because of unremitting tachycardia, after consultation with Cardiologist. Over the course of hospitalization, HR has improved, and rate-control has now been achieved with oral Cardizem and Lopressor. Cardiologist has changed to beta-blocker to Coreg on 09/22/12. Managing as recommended. 4. Hypertension:  BP is well controlled at this time, on diuretics, ACE-i, Cardizem and beta-blocker.   5. Hyperlipidemia: On statin.   6. Acute on chronic renal failure:   Baseline creatinine 1.2. Renal function appears stable at this time.  7. Anemia of chronic disease/acute blood loss anemia due to epistaxis:   Severe, normocytic. Patient presented with a hemoglobin of 7.5, denied black stools, hematochezia or hematemesis. FOBT was negative, although hematinic studies have confirmed iron  deficiency. He was transfused 2 units PRBC on 09/07/12, with bump in  HB to 8.4, and was placed on empiric Protonix. Dr Jeani Hawking provided GI consultation, and has opined that patient is high risk for EGD/Colonoscopy, given his comorbidities. Anticoagulation was placed on hold, against a background of supra-therapeutic INR. 5 mg Vit K was given iv on 09/07/12, and INR has since drifted down. An attempt to restart anticoagulation, resulted in epistaxis, with further worsening of anemia. He has had a total of 6 units PRBC so far, with the last 2, administered today, for a hemoglobin of 6.4. Per Dr Dietrich Pates, patient is at substantial risk for thrombembolism, so the matter has to be revisited. A GI evaluation is would be helpful if feasible. As patient is clinically more stable at this time, Dr Melvia Heaps, gastroenterologist, was consulted on 09/24/12, and Dr Jeani Hawking performed EGD on 09/25/12, which revealed only a sliding hiatal hernia and a nonbleeding small duodenal AVM. Colonoscopy is planned for 09/26/12.  8. Warfarin-induced Coagulopathy:  See above discussion.  9. Suspected sleep apnea:   Patient may benefit from outpatient sleep study.     Code Status: Full Code.  Family Communication:  Disposition Plan: To be determined.    Brief narrative: Brief narrative:  56 year old patient with long-standing chronic severe systolic dysfunction with an EF of around 35%. He presented on 09/06/2012 with complaints of dyspnea, cough with productive sputum, fevers and chills felt to be related to pneumonia. He was also found to be anemic. Previous hemoglobin in 2008 was 13 and was down to just above 8. He had persistent fevers and worsening respiratory status after admission so was transferred to the ICU on 09/09/2012. PCCM assumed care of the patient because of respiratory difficulties. Since admission patient developed issues with atrial fibrillation and RVR that required cardiology consultation and  the patient rate had been stabilized on diltiazem drip and he had subsequently been converted to oral Cardizem. He also continued to have respiratory problems that were associated with superimposed heart failure presumed secondary to stressors of chronic anemia and pneumonia. Because of this cardiology has started the patient on a milrinone infusion. [facility] reassumed care of this patient on 09/14/2012 but he once again decompensated from a respiratory standpoint and required transfer back to the ICU on the same date. It was felt his respiratory symptoms were related to aspiration from recent severe epistaxis. He subsequently restarted her eyes to it was deemed once again appropriate to transfer to the step down unit.   Consultants:  Dr [place] Bing, cardiologist.  Dr Jeani Hawking, GI.  Dr Wynelle Cleveland Roma, PCCM.   Procedures:  Serial CXRs.   Antibiotics:  Cefepime 09/10/12-09/19/12  Vancomycin 09/10/12-09/19/12  HPI/Subjective: No new issues.   Objective: Vital signs in last 24 hours: Temp:  [97.9 F (36.6 C)-99.2 F (37.3 C)] 98.6 F (37 C) (11/12 0442) Pulse Rate:  [86-107] 107  (11/12 0442) Resp:  [20-29] 20  (11/12 0442) BP: (119-157)/(62-107) 120/74 mmHg (11/12 0931) SpO2:  [95 %-100 %] 96 % (11/12 0442) Weight:  [108.6 kg (239 lb 6.7 oz)] 108.6 kg (239 lb 6.7 oz) (11/12 0442) Weight change: -0.536 kg (-1 lb 2.9 oz) Last BM Date: 09/26/12  Intake/Output from previous day: 11/11 0701 - 11/12 0700 In: 240 [P.O.:240] Out: 400 [Urine:400]     Physical Exam: General: Alert, communicative, fully oriented, not short of breath at rest.  HEENT: Moderate clinical pallor, no jaundice, no conjunctival injection or discharge.  NECK: Supple, JVP not seen, no carotid bruits, no palpable lymphadenopathy, no palpable goiter.  CHEST: Clinically clear to auscultation.  HEART: Sounds 1 and 2 heard, normal, irregular, no murmurs.  ABDOMEN: Full, soft, no scars, non-tender, no  palpable organomegaly, no palpable masses, normal bowel sounds.  GENITALIA: Not examined.  LOWER EXTREMITIES: No pitting edema, palpable peripheral pulses.  MUSCULOSKELETAL SYSTEM: Unremarkable.  CENTRAL NERVOUS SYSTEM: No focal neurologic deficit on gross examination.  Lab Results:  Basename 09/26/12 0440 09/25/12 0530  WBC 9.4 8.3  HGB 7.3* 7.2*  HCT 22.6* 22.6*  PLT 262 246    Basename 09/26/12 0440 09/25/12 0530  NA 135 134*  K 4.2 4.2  CL 100 102  CO2 25 25  GLUCOSE 89 107*  BUN 23 28*  CREATININE 1.34 1.32  CALCIUM 9.1 9.0   Recent Results (from the past 240 hour(s))  CULTURE, EXPECTORATED SPUTUM-ASSESSMENT     Status: Normal   Collection Time   09/19/12 11:30 PM      Component Value Range Status Comment   Specimen Description SPUTUM   Final    Special Requests NONE   Final    Sputum evaluation     Final    Value: THIS SPECIMEN IS ACCEPTABLE. RESPIRATORY CULTURE REPORT TO FOLLOW.   Report Status 09/20/2012 FINAL   Final   CULTURE, RESPIRATORY     Status: Normal   Collection Time   09/19/12 11:30 PM      Component Value Range Status Comment   Specimen Description SPUTUM   Final    Special Requests NONE   Final    Gram Stain     Final    Value: ABUNDANT WBC PRESENT, PREDOMINANTLY PMN     NO SQUAMOUS EPITHELIAL CELLS SEEN     NO ORGANISMS SEEN   Culture FEW CANDIDA ALBICANS   Final    Report Status 09/22/2012 FINAL   Final   CULTURE, BLOOD (ROUTINE X 2)     Status: Normal   Collection Time   09/20/12  4:58 PM      Component Value Range Status Comment   Specimen Description BLOOD LEFT HAND   Final    Special Requests BOTTLES DRAWN AEROBIC ONLY 5CC   Final    Culture  Setup Time 09/20/2012 23:51   Final    Culture NO GROWTH 5 DAYS   Final    Report Status 09/26/2012 FINAL   Final   CULTURE, BLOOD (ROUTINE X 2)     Status: Normal   Collection Time   09/20/12  5:05 PM      Component Value Range Status Comment   Specimen Description BLOOD RIGHT ARM   Final     Special Requests BOTTLES DRAWN AEROBIC AND ANAEROBIC 10CC   Final    Culture  Setup Time 09/20/2012 23:50   Final    Culture NO GROWTH 5 DAYS   Final    Report Status 09/26/2012 FINAL   Final      Studies/Results: No results found.  Medications: Scheduled Meds:    . amoxicillin-clavulanate  1 tablet Oral Q12H  . carvedilol  12.5 mg Oral BID WC  . diltiazem  180 mg Oral Daily  . feeding supplement  237 mL Oral Q24H  . ferrous sulfate  325 mg Oral BID WC  . furosemide  40 mg Oral Daily  . isosorbide mononitrate  60 mg Oral Daily  . lisinopril  5 mg Oral Daily  . pantoprazole  40 mg Oral Q1200  . [COMPLETED] polyethylene glycol powder  1 Container Oral Once  . [COMPLETED] polyethylene  glycol-electrolytes  4,000 mL Oral Once  . pravastatin  40 mg Oral QPC supper  . sodium chloride  3 mL Intravenous Q12H   Continuous Infusions:    . sodium chloride    . [DISCONTINUED] sodium chloride 500 mL (09/25/12 1215)   PRN Meds:.sodium chloride, acetaminophen, ibuprofen, levalbuterol, oxyCODONE, sodium chloride, [DISCONTINUED] butamben-tetracaine-benzocaine, [DISCONTINUED] fentaNYL, [DISCONTINUED] midazolam    LOS: 20 days   Marilin Kofman,CHRISTOPHER  [facility] Pager [phone]. If 8PM-8AM, please contact night-coverage at [email or web address], password [facility] 09/26/2012, 10:03 AM  LOS: 20 days

## 2012-09-26 NOTE — H&P (View-Only) (Signed)
[facility] PROGRESS NOTE  Eileen L Tuccillo MRN:[id number] DOB: 07/25/1956 DOA: 09/06/2012 PCP: TAPPER,DAVID B, MD  Assessment/Plan: Active Problems:  Atrial fibrillation with RVR  Chronic systolic CHF (congestive heart failure), NYHA class 3  IMPLANTATION OF DEFIBRILLATOR, HX OF  Iron deficiency anemia, unspecified  CAP (community acquired pneumonia)  Cardiomyopathy, nonischemic  Hypertension  Acute respiratory failure with hypoxia  Warfarin-induced coagulopathy  Mild epistaxis  CKD (chronic kidney disease) stage 2, GFR 60-89 ml/min  Acute renal failure  Thrombocytopenia     1. Severe community-acquired pneumonia/Acute hypoxic respiratory failure:  Patient presented with chills, and a productive cough. CXR demonstrated bilateral patchy airspace opacities, right greater than left, and he had a leukocytosis of 12.0. He was managed initially, with iv rocephin/Azithromycin, but airspace disease progressed, he continued to spike pyrexia, and on 09/09/12, spiked as high as 103.5 degrees Farenheit, with a tachypnea of 30-40/min and Oxygen saturation in the 80s. Antibiotics were broadened to include Vancomycin and Cefepime, while Rocephin was discontinued. He was transferred to ICU on that date. Clinically, he has had a rather turbulent clinical course, but now appears to have turned the corner. Follow up CXR on 09/18/2012 was without significant improvement, but clinically, pneumonia has improved greatly, wcc had normalized at 8.5 as of 06/21/12, and he is now saturating at 98%-100% off oxygen. He did have an episode of aspiration/Pneumonitis on 09/14/2012, related to episode of epistaxis, but this has since resolved. SLP has cleared for diet and patient is tolerating a mechanical soft diet. Repeat CXR on 09/23/12 showed that right lower lobe air space disease may be slightly improved. There is new left lower lobe air space disease. It is clear that patient has recurrent silent aspiration. He  has been afebrile since 09/23/12. Will treat with an additional 7 days of oral Augumentin, to be concluded on 10/01/12. 2. Chronic systolic congestive heart failure/Non-ischemic cardiomyopathy:  Patient has a known history of chronic systolic CHF, nonischemic cardiomyopathy, cardiac arrest (1998), s/p AICD. He initially appeared compensated on admission, but became acutely dyspneic, on 09/07/12, after completion of blood transfusion. CXR confirmed volume overload, and patient was managed aggressively with iv Lasix. Rothbart provided cardiology consultation and ICD check was carried out by Dr James Allred, EPS, on 09/08/12. ICD interrogation was reviewed and declared normal. Cardiology team has continued to follow and have been very helpful in managing decompensated heart failure,in this complicated patient. As of 09/21/12, he appears clinically better compensated. Will need to follow-up with Gene Serpe in [place] cardiology [place] office 2 weeks post discharge 3. Atrial fibrillation:   On 09/08/12, patient went into fast atrial fibrillation, with HR in the 130s-140s. He was started on iv Cardizem, and iv Amiodarone was  Added on 09/09/12, because of unremitting tachycardia, after consultation with Cardiologist. Over the course of hospitalization, HR has improved, and rate-control has now been achieved with oral Cardizem and Lopressor. Cardiologist has changed to beta-blocker to Coreg on 09/22/12. Managing as recommended. 4. Hypertension:  BP is well controlled at this time, on diuretics, ACE-i, Cardizem and beta-blocker.   5. Hyperlipidemia: On statin.   6. Acute on chronic renal failure:   Baseline creatinine 1.2. Renal function appears stable at this time.  7. Anemia of chronic disease/acute blood loss anemia due to epistaxis:   Severe, normocytic. Patient presented with a hemoglobin of 7.5, denied black stools, hematochezia or hematemesis. FOBT was negative, although hematinic studies have confirmed iron  deficiency. He was transfused 2 units PRBC on 09/07/12, with bump in   HB to 8.4, and was placed on empiric Protonix. Dr Patrick Hung provided GI consultation, and has opined that patient is high risk for EGD/Colonoscopy, given his comorbidities. Anticoagulation was placed on hold, against a background of supra-therapeutic INR. 5 mg Vit K was given iv on 09/07/12, and INR has since drifted down. An attempt to restart anticoagulation, resulted in epistaxis, with further worsening of anemia. He has had a total of 6 units PRBC so far, with the last 2, administered today, for a hemoglobin of 6.4. Per Dr Rothbart, patient is at substantial risk for thrombembolism, so the matter has to be revisited. A GI evaluation is would be helpful if feasible. As patient is clinically more stable at this time, Dr Robert Kaplan, gastroenterologist, was consulted on 09/24/12, and Dr Patrick Hung performed EGD on 09/25/12, which revealed only a sliding hiatal hernia and a nonbleeding small duodenal AVM. Colonoscopy is planned for 09/26/12.  8. Warfarin-induced Coagulopathy:  See above discussion.  9. Suspected sleep apnea:   Patient may benefit from outpatient sleep study.     Code Status: Full Code.  Family Communication:  Disposition Plan: To be determined.    Brief narrative: Brief narrative:  56-year-old patient with long-standing chronic severe systolic dysfunction with an EF of around 35%. He presented on 09/06/2012 with complaints of dyspnea, cough with productive sputum, fevers and chills felt to be related to pneumonia. He was also found to be anemic. Previous hemoglobin in 2008 was 13 and was down to just above 8. He had persistent fevers and worsening respiratory status after admission so was transferred to the ICU on 09/09/2012. PCCM assumed care of the patient because of respiratory difficulties. Since admission patient developed issues with atrial fibrillation and RVR that required cardiology consultation and  the patient rate had been stabilized on diltiazem drip and he had subsequently been converted to oral Cardizem. He also continued to have respiratory problems that were associated with superimposed heart failure presumed secondary to stressors of chronic anemia and pneumonia. Because of this cardiology has started the patient on a milrinone infusion. [facility] team 1 reassumed care of this patient on 09/14/2012 but he once again decompensated from a respiratory standpoint and required transfer back to the ICU on the same date. It was felt his respiratory symptoms were related to aspiration from recent severe epistaxis. He subsequently restarted her eyes to it was deemed once again appropriate to transfer to the step down unit.   Consultants:  Dr Robert Rothbart, cardiologist.  Dr Patrick Hung, GI.  Dr Domenick Roma, PCCM.   Procedures:  Serial CXRs.   Antibiotics:  Cefepime 09/10/12-09/19/12  Vancomycin 09/10/12-09/19/12  HPI/Subjective: No new issues.   Objective: Vital signs in last 24 hours: Temp:  [97.9 F (36.6 C)-99.2 F (37.3 C)] 98.6 F (37 C) (11/12 0442) Pulse Rate:  [86-107] 107  (11/12 0442) Resp:  [20-29] 20  (11/12 0442) BP: (119-157)/(62-107) 120/74 mmHg (11/12 0931) SpO2:  [95 %-100 %] 96 % (11/12 0442) Weight:  [108.6 kg (239 lb 6.7 oz)] 108.6 kg (239 lb 6.7 oz) (11/12 0442) Weight change: -0.536 kg (-1 lb 2.9 oz) Last BM Date: 09/26/12  Intake/Output from previous day: 11/11 0701 - 11/12 0700 In: 240 [P.O.:240] Out: 400 [Urine:400]     Physical Exam: General: Alert, communicative, fully oriented, not short of breath at rest.  HEENT: Moderate clinical pallor, no jaundice, no conjunctival injection or discharge.  NECK: Supple, JVP not seen, no carotid bruits, no palpable lymphadenopathy, no palpable goiter.    CHEST: Clinically clear to auscultation.  HEART: Sounds 1 and 2 heard, normal, irregular, no murmurs.  ABDOMEN: Full, soft, no scars, non-tender, no  palpable organomegaly, no palpable masses, normal bowel sounds.  GENITALIA: Not examined.  LOWER EXTREMITIES: No pitting edema, palpable peripheral pulses.  MUSCULOSKELETAL SYSTEM: Unremarkable.  CENTRAL NERVOUS SYSTEM: No focal neurologic deficit on gross examination.  Lab Results:  Basename 09/26/12 0440 09/25/12 0530  WBC 9.4 8.3  HGB 7.3* 7.2*  HCT 22.6* 22.6*  PLT 262 246    Basename 09/26/12 0440 09/25/12 0530  NA 135 134*  K 4.2 4.2  CL 100 102  CO2 25 25  GLUCOSE 89 107*  BUN 23 28*  CREATININE 1.34 1.32  CALCIUM 9.1 9.0   Recent Results (from the past 240 hour(s))  CULTURE, EXPECTORATED SPUTUM-ASSESSMENT     Status: Normal   Collection Time   09/19/12 11:30 PM      Component Value Range Status Comment   Specimen Description SPUTUM   Final    Special Requests NONE   Final    Sputum evaluation     Final    Value: THIS SPECIMEN IS ACCEPTABLE. RESPIRATORY CULTURE REPORT TO FOLLOW.   Report Status 09/20/2012 FINAL   Final   CULTURE, RESPIRATORY     Status: Normal   Collection Time   09/19/12 11:30 PM      Component Value Range Status Comment   Specimen Description SPUTUM   Final    Special Requests NONE   Final    Gram Stain     Final    Value: ABUNDANT WBC PRESENT, PREDOMINANTLY PMN     NO SQUAMOUS EPITHELIAL CELLS SEEN     NO ORGANISMS SEEN   Culture FEW CANDIDA ALBICANS   Final    Report Status 09/22/2012 FINAL   Final   CULTURE, BLOOD (ROUTINE X 2)     Status: Normal   Collection Time   09/20/12  4:58 PM      Component Value Range Status Comment   Specimen Description BLOOD LEFT HAND   Final    Special Requests BOTTLES DRAWN AEROBIC ONLY 5CC   Final    Culture  Setup Time 09/20/2012 23:51   Final    Culture NO GROWTH 5 DAYS   Final    Report Status 09/26/2012 FINAL   Final   CULTURE, BLOOD (ROUTINE X 2)     Status: Normal   Collection Time   09/20/12  5:05 PM      Component Value Range Status Comment   Specimen Description BLOOD RIGHT ARM   Final     Special Requests BOTTLES DRAWN AEROBIC AND ANAEROBIC 10CC   Final    Culture  Setup Time 09/20/2012 23:50   Final    Culture NO GROWTH 5 DAYS   Final    Report Status 09/26/2012 FINAL   Final      Studies/Results: No results found.  Medications: Scheduled Meds:    . amoxicillin-clavulanate  1 tablet Oral Q12H  . carvedilol  12.5 mg Oral BID WC  . diltiazem  180 mg Oral Daily  . feeding supplement  237 mL Oral Q24H  . ferrous sulfate  325 mg Oral BID WC  . furosemide  40 mg Oral Daily  . isosorbide mononitrate  60 mg Oral Daily  . lisinopril  5 mg Oral Daily  . pantoprazole  40 mg Oral Q1200  . [COMPLETED] polyethylene glycol powder  1 Container Oral Once  . [COMPLETED] polyethylene   glycol-electrolytes  4,000 mL Oral Once  . pravastatin  40 mg Oral QPC supper  . sodium chloride  3 mL Intravenous Q12H   Continuous Infusions:    . sodium chloride    . [DISCONTINUED] sodium chloride 500 mL (09/25/12 1215)   PRN Meds:.sodium chloride, acetaminophen, ibuprofen, levalbuterol, oxyCODONE, sodium chloride, [DISCONTINUED] butamben-tetracaine-benzocaine, [DISCONTINUED] fentaNYL, [DISCONTINUED] midazolam    LOS: 20 days   Rozell Theiler,CHRISTOPHER  [facility] Pager [phone]. If 8PM-8AM, please contact night-coverage at [email or web address], password [email or web address] 09/26/2012, 10:03 AM  LOS: 20 days     

## 2012-09-26 NOTE — Op Note (Signed)
Eligha Bridegroom [facility] [address] [place], [address]   OPERATIVE PROCEDURE REPORT  PATIENT: [name] [name], [name] [name]  MR#: [id number] BIRTHDATE: March 14, 1956  GENDER: Male ENDOSCOPIST: Jeani Hawking, MD ASSISTANT:   Kandice Robinsons, technician Claudie Revering, RN CGRN PROCEDURE DATE: 09/26/2012 PROCEDURE:   Colonoscopy with snare polypectomy ASA CLASS:   Class III INDICATIONS:anemia, non-specific. MEDICATIONS: Versed 8 mg IV, Fentanyl 100 mcg IV, and Benadryl 25 mg IV  DESCRIPTION OF PROCEDURE:   After the risks benefits and alternatives of the procedure were thoroughly explained, informed consent was obtained.  A digital rectal exam revealed no abnormalities of the rectum.    The Pentax Colonoscope [id number] endoscope was introduced through the anus  and advanced to the cecum, which was identified by both the appendix and ileocecal valve , No adverse events experienced.    The quality of the prep was excellent. .  The instrument was then slowly withdrawn as the colon was fully examined.     FINDINGS: A 3 mm sessile transverse colon polyp was removed with a cold snare.  No evidence of any inflammation, ulcerations, erosions, masses, or vascular abnormalities.   Retroflexed views revealed internal/external hemorrhoids.     The scope was then withdrawn from the patient and the procedure terminated.  COMPLICATIONS: There were no complications.  IMPRESSION: 1) Transverse colon polyp.  RECOMMENDATIONS: 1.  Await biopsy results 2.  Capsule endoscopy   _______________________________ eSigned:  Jeani Hawking, MD 09/26/2012 3:35 PM

## 2012-09-27 ENCOUNTER — Encounter (HOSPITAL_COMMUNITY): Payer: Self-pay | Admitting: Gastroenterology

## 2012-09-27 LAB — BASIC METABOLIC PANEL
BUN: 19 mg/dL (ref 6–23)
Calcium: 8.8 mg/dL (ref 8.4–10.5)
Creatinine, Ser: 1.34 mg/dL (ref 0.50–1.35)
GFR calc Af Amer: 68 mL/min — ABNORMAL LOW (ref >90)
GFR calc non Af Amer: 59 mL/min — ABNORMAL LOW (ref >90)

## 2012-09-27 LAB — CBC
HCT: 20.5 % — ABNORMAL LOW (ref 39.0–52.0)
MCHC: 32.7 g/dL (ref 30.0–36.0)
MCV: 85.8 fL (ref 78.0–100.0)
RDW: 16 % — ABNORMAL HIGH (ref 11.5–15.5)

## 2012-09-27 LAB — PREPARE RBC (CROSSMATCH)

## 2012-09-27 MED ORDER — ISOSORBIDE MONONITRATE ER 30 MG PO TB24
30.0000 mg | ORAL_TABLET | Freq: Every day | ORAL | Status: DC
  Administered 2012-09-28 – 2012-09-29 (×2): 30 mg via ORAL
  Filled 2012-09-27 (×2): qty 1

## 2012-09-27 MED ORDER — BOOST / RESOURCE BREEZE PO LIQD
1.0000 | Freq: Every day | ORAL | Status: DC
  Administered 2012-09-27 – 2012-09-28 (×2): 1 via ORAL

## 2012-09-27 MED ORDER — FUROSEMIDE 10 MG/ML IJ SOLN
40.0000 mg | Freq: Once | INTRAMUSCULAR | Status: AC
  Administered 2012-09-27: 40 mg via INTRAVENOUS
  Filled 2012-09-27: qty 4

## 2012-09-27 MED ORDER — DILTIAZEM HCL ER COATED BEADS 240 MG PO CP24
240.0000 mg | ORAL_CAPSULE | Freq: Every day | ORAL | Status: DC
  Administered 2012-09-28 – 2012-09-29 (×2): 240 mg via ORAL
  Filled 2012-09-27 (×2): qty 1

## 2012-09-27 NOTE — Progress Notes (Signed)
Occupational Therapy Treatment Patient Details Name: [name] MRN: [id number] DOB: [birth year] Today's Date: 09/27/2012 Time: 7829-5621 OT Time Calculation (min): 11 min  OT Assessment / Plan / Recommendation Comments on Treatment Session Pt. with improving balance.  HR increased into 170s with minimal activity    Follow Up Recommendations  SNF    Barriers to Discharge       Equipment Recommendations  None recommended by OT    Recommendations for Other Services    Frequency Min 2X/week   Plan Discharge plan remains appropriate    Precautions / Restrictions Precautions Precautions: Fall Restrictions Weight Bearing Restrictions: No   Pertinent Vitals/Pain     ADL  Eating/Feeding: Independent Grooming: Wash/dry hands;Minimal assistance Where Assessed - Grooming: Unsupported standing Toileting - Clothing Manipulation and Hygiene: Minimal assistance Where Assessed - Toileting Clothing Manipulation and Hygiene: Standing Transfers/Ambulation Related to ADLs: Min A and min cues for walker safety ADL Comments: Pt. stood to urinate    OT Diagnosis:    OT Problem List:   OT Treatment Interventions:     OT Goals ADL Goals ADL Goal: Eating - Progress: Met ADL Goal: Grooming - Progress: Progressing toward goals ADL Goal: Toileting - Clothing Manipulation - Progress: Progressing toward goals  Visit Information  Last OT Received On: 09/27/12 Assistance Needed: +1    Subjective Data      Prior Functioning       Cognition  Overall Cognitive Status: Appears within functional limits for tasks assessed/performed Area of Impairment: Attention;Safety/judgement;Awareness of deficits Orientation Level: Appears intact for tasks assessed Behavior During Session: [facility] for tasks performed Current Attention Level: Selective Safety/Judgement: Decreased safety judgement for tasks assessed Cognition - Other Comments: Pt requires cues for safe walker use     Mobility  Shoulder Instructions Transfers Transfers: Sit to Stand;Stand to Sit Sit to Stand: 4: Min guard;From chair/3-in-1;With upper extremity assist Stand to Sit: 4: Min guard;To chair/3-in-1;With upper extremity assist Details for Transfer Assistance: cues for safety and walker use       Exercises      Balance     End of Session OT - End of Session Activity Tolerance: Treatment limited secondary to medical complications (Comment) (increased HR) Patient left: in chair;with call bell/phone within reach Nurse Communication: Other (comment) (increased HR)  GO     [name] [name] M 09/27/2012, 5:50 PM

## 2012-09-27 NOTE — Progress Notes (Signed)
[facility] PROGRESS NOTE  NORM MARBLE AVW:[id number] DOB: 10-20-1956 DOA: 09/06/2012 PCP: [name], MD  Assessment/Plan: Active Problems:  Atrial fibrillation with RVR  Chronic systolic CHF (congestive heart failure), NYHA class 3  IMPLANTATION OF DEFIBRILLATOR, HX OF  Iron deficiency anemia, unspecified  CAP (community acquired pneumonia)  Cardiomyopathy, nonischemic  Hypertension  Acute respiratory failure with hypoxia  Warfarin-induced coagulopathy  Mild epistaxis  CKD (chronic kidney disease) stage 2, GFR 60-89 ml/min  Acute renal failure  Thrombocytopenia     1. Severe community-acquired pneumonia/Acute hypoxic respiratory failure:  Patient presented with chills, and a productive cough. CXR demonstrated bilateral patchy airspace opacities, right greater than left, and he had a leukocytosis of 12.0. He was managed initially, with iv rocephin/Azithromycin, but airspace disease progressed, he continued to spike pyrexia, and on 09/09/12, spiked as high as 103.5 degrees Farenheit, with a tachypnea of 30-40/min and Oxygen saturation in the 80s. Antibiotics were broadened to include Vancomycin and Cefepime, while Rocephin was discontinued. He was transferred to ICU on that date. Clinically, he has had a rather turbulent clinical course, but now appears to have turned the corner. Follow up CXR on 09/18/2012 was without significant improvement, but clinically, pneumonia has improved greatly, wcc had normalized at 8.5 as of 06/21/12, and he is now saturating at 98%-100% off oxygen. He did have an episode of aspiration/Pneumonitis on 09/14/2012, related to episode of epistaxis, but this has since resolved. SLP has cleared for diet and patient is tolerating a mechanical soft diet. Repeat CXR on 09/23/12 showed that right lower lobe air space disease may be slightly improved. There is new left lower lobe air space disease. It is clear that patient has recurrent silent aspiration. He  has been afebrile since 09/23/12. Will treat with an additional 7 days of oral Augumentin, to be concluded on 10/01/12.  2. Chronic systolic congestive heart failure/Non-ischemic cardiomyopathy:  Patient has a known history of chronic systolic CHF, nonischemic cardiomyopathy, cardiac arrest (1998), s/p AICD. He initially appeared compensated on admission, but became acutely dyspneic, on 09/07/12, after completion of blood transfusion. CXR confirmed volume overload, and patient was managed aggressively with iv Lasix. Rothbart provided cardiology consultation and ICD check was carried out by Dr Hillis Range, EPS, on 09/08/12. ICD interrogation was reviewed and declared normal. Cardiology team has continued to follow and have been very helpful in managing decompensated heart failure,in this complicated patient. As of 09/21/12, he appears clinically better compensated. Will need to follow-up with Gene Serpe in [facility] cardiology [place] office 2 weeks post discharge.  3. Atrial fibrillation-rate controlled in 150's On 09/08/12, patient went into fast atrial fibrillation, with HR in the 130s-140s. He was started on iv Cardizem, and iv Amiodarone was  Added on 09/09/12, because of unremitting tachycardia, after consultation with Cardiologist. Over the course of hospitalization, HR has improved, and rate-control has now been achieved with oral Cardizem 180-which was inc to 240 daily [increased 09/27/12] and Coreg 12.5 bid.   4. Hypertension:  BP is well controlled at this time, on lasix 40 qd,Lisinopril 5, Cardizem 240 and beta-blocker coreg 12.5 bid--Cut back dose of Imdur from 60-->30 11/13.    5. Hyperlipidemia: On statin- Pravacol 40 daily  6. Acute on chronic renal failure:   Baseline creatinine 1.2. Renal function appears stable at this time.   7. Anemia of chronic disease/acute blood loss anemia due to epistaxis:   Severe, normocytic. Patient presented with a hemoglobin of 7.5, denied black stools,  hematochezia or hematemesis. FOBT  was negative, although hematinic studies have confirmed iron deficiency. He was transfused 2 units PRBC on 09/07/12, with bump in HB to 8.4, and was placed on empiric Protonix. Dr Jeani Hawking provided GI consultation, and has opined that patient is high risk for EGD/Colonoscopy, given his comorbidities. Anticoagulation was placed on hold, against a background of supra-therapeutic INR. 5 mg Vit K was given iv on 09/07/12, and INR has since drifted down.  An attempt to restart anticoagulation, resulted in epistaxis, with further worsening of anemia. He has had a total of 6 units PRBC so far, with the last 2, administered today, for a hemoglobin of 6.4. Per Dr Dietrich Pates, patient is at substantial risk for thrombembolism, so the matter has to be revisited. A GI evaluation is would be helpful if feasible. As patient is clinically more stable at this time, Dr Melvia Heaps, gastroenterologist, was consulted on 09/24/12, and Dr Jeani Hawking performed EGD on 09/25/12, which revealed only a sliding hiatal hernia and a nonbleeding small duodenal AVM. Colonoscopy 09/26/12 showed 2 small polyps and Capsule endoscopy showed no significant bleed.  He is cvurrently recieived another 2 U PRBC now and we will review labs in am  8. Warfarin-induced Coagulopathy:  See above discussion.   9. Suspected sleep apnea:   Patient may benefit from outpatient sleep study.     Code Status: Full Code.  Family Communication:  Disposition Plan: To be determined.    Brief narrative: Brief narrative:  56 year old patient with long-standing chronic severe systolic dysfunction with an EF of around 35%. He presented on 09/06/2012 with complaints of dyspnea, cough with productive sputum, fevers and chills felt to be related to pneumonia. He was also found to be anemic. Previous hemoglobin in 2008 was 13 and was down to just above 8. He had persistent fevers and worsening respiratory status after  admission so was transferred to the ICU on 09/09/2012. PCCM assumed care of the patient because of respiratory difficulties. Since admission patient developed issues with atrial fibrillation and RVR that required cardiology consultation and the patient rate had been stabilized on diltiazem drip and he had subsequently been converted to oral Cardizem. He also continued to have respiratory problems that were associated with superimposed heart failure presumed secondary to stressors of chronic anemia and pneumonia. Because of this cardiology has started the patient on a milrinone infusion. [facility] reassumed care of this patient on 09/14/2012 but he once again decompensated from a respiratory standpoint and required transfer back to the ICU on the same date. It was felt his respiratory symptoms were related to aspiration from recent severe epistaxis. He subsequently restarted her eyes to it was deemed once again appropriate to transfer to the step down unit.   Consultants:  Dr Geneva Bing, cardiologist.  Dr Jeani Hawking, GI.  Dr Wynelle Cleveland Roma, PCCM.   Procedures:  Serial CXRs.   Antibiotics:  Cefepime 09/10/12-09/19/12  Vancomycin 09/10/12-09/19/12  HPI/Subjective: No new issues.   Objective: Vital signs in last 24 hours: Temp:  [98.4 F (36.9 C)-99.2 F (37.3 C)] 98.6 F (37 C) (11/13 1425) Pulse Rate:  [74-126] 102  (11/13 1425) Resp:  [9-32] 16  (11/13 1425) BP: (107-164)/(34-104) 107/65 mmHg (11/13 1425) SpO2:  [90 %-100 %] 100 % (11/13 0416) Weight change:  Last BM Date: 09/26/12  Intake/Output from previous day: 11/12 0701 - 11/13 0700 In: -  Out: 425 [Urine:425] Total I/O In: 3 [I.V.:3] Out: 575 [Urine:575]   Physical Exam: General: Alert, communicative, fully oriented, not  short of breath at rest.  HEENT: Moderate clinical pallor, no jaundice, no conjunctival injection or discharge.  NECK: Supple, JVP not seen, no carotid bruits, no palpable  lymphadenopathy, no palpable goiter.  CHEST: Clinically clear to auscultation.  HEART: Sounds 1 and 2 heard, normal, irregular, no murmurs.  ABDOMEN: Full, soft, no scars, non-tender, no palpable organomegaly, no palpable masses, normal bowel sounds.  GENITALIA: Not examined.  LOWER EXTREMITIES: No pitting edema, palpable peripheral pulses.  MUSCULOSKELETAL SYSTEM: Unremarkable.  CENTRAL NERVOUS SYSTEM: No focal neurologic deficit on gross examination.  Lab Results:  [facility] 09/27/12 0432 09/26/12 0440  WBC 7.1 9.4  HGB 6.7* 7.3*  HCT 20.5* 22.6*  PLT 220 262    Basename 09/27/12 0432 09/26/12 0440  NA 135 135  K 4.0 4.2  CL 102 100  CO2 23 25  GLUCOSE 88 89  BUN 19 23  CREATININE 1.34 1.34  CALCIUM 8.8 9.1   Recent Results (from the past 240 hour(s))  CULTURE, EXPECTORATED SPUTUM-ASSESSMENT     Status: Normal   Collection Time   09/19/12 11:30 PM      Component Value Range Status Comment   Specimen Description SPUTUM   Final    Special Requests NONE   Final    Sputum evaluation     Final    Value: THIS SPECIMEN IS ACCEPTABLE. RESPIRATORY CULTURE REPORT TO FOLLOW.   Report Status 09/20/2012 FINAL   Final   CULTURE, RESPIRATORY     Status: Normal   Collection Time   09/19/12 11:30 PM      Component Value Range Status Comment   Specimen Description SPUTUM   Final    Special Requests NONE   Final    Gram Stain     Final    Value: ABUNDANT WBC PRESENT, PREDOMINANTLY PMN     NO SQUAMOUS EPITHELIAL CELLS SEEN     NO ORGANISMS SEEN   Culture FEW CANDIDA ALBICANS   Final    Report Status 09/22/2012 FINAL   Final   CULTURE, BLOOD (ROUTINE X 2)     Status: Normal   Collection Time   09/20/12  4:58 PM      Component Value Range Status Comment   Specimen Description BLOOD LEFT HAND   Final    Special Requests BOTTLES DRAWN AEROBIC ONLY 5CC   Final    Culture  Setup Time 09/20/2012 23:51   Final    Culture NO GROWTH 5 DAYS   Final    Report Status 09/26/2012 FINAL   Final     CULTURE, BLOOD (ROUTINE X 2)     Status: Normal   Collection Time   09/20/12  5:05 PM      Component Value Range Status Comment   Specimen Description BLOOD RIGHT ARM   Final    Special Requests BOTTLES DRAWN AEROBIC AND ANAEROBIC 10CC   Final    Culture  Setup Time 09/20/2012 23:50   Final    Culture NO GROWTH 5 DAYS   Final    Report Status 09/26/2012 FINAL   Final      Studies/Results: No results found.  Medications: Scheduled Meds:    . amoxicillin-clavulanate  1 tablet Oral Q12H  . carvedilol  12.5 mg Oral BID WC  . diltiazem  180 mg Oral Daily  . feeding supplement  237 mL Oral Q24H  . ferrous sulfate  325 mg Oral BID WC  . furosemide  40 mg Oral Daily  . isosorbide mononitrate  60  mg Oral Daily  . lisinopril  5 mg Oral Daily  . pantoprazole  40 mg Oral Q1200  . pravastatin  40 mg Oral QPC supper  . sodium chloride  3 mL Intravenous Q12H   Continuous Infusions:    . sodium chloride    . [DISCONTINUED] sodium chloride     PRN Meds:.sodium chloride, acetaminophen, ibuprofen, levalbuterol, oxyCODONE, sodium chloride, [DISCONTINUED] diphenhydrAMINE, [DISCONTINUED] fentaNYL, [DISCONTINUED] midazolam    LOS: 21 days   Mahala Menghini [facility]  [facility] Pager [phone]. If 8PM-8AM, please contact night-coverage at [email or web address], password [facility] 09/27/2012, 3:09 PM  LOS: 21 days

## 2012-09-27 NOTE — Progress Notes (Signed)
Nutrition Follow-up  Intervention:    Resource Breeze supplement (250 kcals, 9 gm protein per 8 fl oz carton) RD to follow for nutrition care plan  Assessment:   Patient s/p EGD 11/11: impression: hiatal hernia, non-bleeding small duodenal.  S/p colonoscopy 11/12: impression: transverse colon polyp.  Per flowsheet records, PO intake mostly 75%, however, last evening 25% for dinner.  States he didn't like the Ensure supplement -- amenable to trying [employer] -- RD to order.  Diet Order:  Dysphagia 3, thin liquids  Meds: Scheduled Meds:   . amoxicillin-clavulanate  1 tablet Oral Q12H  . carvedilol  12.5 mg Oral BID WC  . diltiazem  180 mg Oral Daily  . feeding supplement  237 mL Oral Q24H  . ferrous sulfate  325 mg Oral BID WC  . furosemide  40 mg Oral Daily  . isosorbide mononitrate  60 mg Oral Daily  . lisinopril  5 mg Oral Daily  . pantoprazole  40 mg Oral Q1200  . pravastatin  40 mg Oral QPC supper  . sodium chloride  3 mL Intravenous Q12H   Continuous Infusions:   . sodium chloride    . [DISCONTINUED] sodium chloride     PRN Meds:.sodium chloride, acetaminophen, ibuprofen, levalbuterol, oxyCODONE, sodium chloride, [DISCONTINUED] diphenhydrAMINE, [DISCONTINUED] fentaNYL, [DISCONTINUED] midazolam   CMP     Component Value Date/Time   NA 135 09/27/2012 0432   K 4.0 09/27/2012 0432   CL 102 09/27/2012 0432   CO2 23 09/27/2012 0432   GLUCOSE 88 09/27/2012 0432   BUN 19 09/27/2012 0432   CREATININE 1.34 09/27/2012 0432   CALCIUM 8.8 09/27/2012 0432   PROT 8.3 09/22/2012 0455   ALBUMIN 2.0* 09/22/2012 0455   AST 50* 09/22/2012 0455   ALT 46 09/22/2012 0455   ALKPHOS 124* 09/22/2012 0455   BILITOT 1.0 09/22/2012 0455   GFRNONAA 59* 09/27/2012 0432   GFRAA 68* 09/27/2012 0432     Intake/Output Summary (Last 24 hours) at 09/27/12 1501 Last data filed at 09/27/12 1100  Gross per 24 hour  Intake      3 ml  Output   1000 ml  Net   -997 ml    Weight Status:  108.6  kg (11/13) -- stable  Re-estimated needs:  2100-2300 kcals, 110-120 gm protein  New Nutrition Dx:  Inadequate Oral Intake r/t decreased appetite as evidenced by PO intake 25-75%, ongoing  Goal:  Oral intake with meals & supplements to meet >/= 90% of estimated nutrition needs, progressing  Monitor:  PO & supplemental intake, weight, labs, I/O's  Kirkland Hun, RD, LDN Pager #: [phone] After-Hours Pager #: [phone]

## 2012-09-28 LAB — BASIC METABOLIC PANEL
BUN: 18 mg/dL (ref 6–23)
CO2: 23 mEq/L (ref 19–32)
Calcium: 9.3 mg/dL (ref 8.4–10.5)
Creatinine, Ser: 1.44 mg/dL — ABNORMAL HIGH (ref 0.50–1.35)
Glucose, Bld: 103 mg/dL — ABNORMAL HIGH (ref 70–99)

## 2012-09-28 LAB — CBC
HCT: 25.9 % — ABNORMAL LOW (ref 39.0–52.0)
MCH: 27.6 pg (ref 26.0–34.0)
MCHC: 32.4 g/dL (ref 30.0–36.0)
MCV: 85.2 fL (ref 78.0–100.0)
Platelets: 242 10*3/uL (ref 150–400)
RDW: 15.7 % — ABNORMAL HIGH (ref 11.5–15.5)

## 2012-09-28 LAB — TYPE AND SCREEN
ABO/RH(D): O POS
Unit division: 0

## 2012-09-28 MED ORDER — LISINOPRIL 10 MG PO TABS: 5.0000 mg | ORAL_TABLET | Freq: Every day | ORAL | Status: DC

## 2012-09-28 MED ORDER — DILTIAZEM HCL ER COATED BEADS 240 MG PO CP24: 240.0000 mg | ORAL_CAPSULE | Freq: Every day | ORAL | Status: DC

## 2012-09-28 MED ORDER — AMOXICILLIN-POT CLAVULANATE 875-125 MG PO TABS: 1.0000 | ORAL_TABLET | Freq: Two times a day (BID) | ORAL | Status: DC

## 2012-09-28 MED ORDER — FERROUS SULFATE 325 (65 FE) MG PO TABS: 325.0000 mg | ORAL_TABLET | Freq: Two times a day (BID) | ORAL | Status: DC

## 2012-09-28 MED ORDER — CARVEDILOL 12.5 MG PO TABS: 12.5000 mg | ORAL_TABLET | Freq: Two times a day (BID) | ORAL | Status: DC

## 2012-09-28 MED ORDER — FUROSEMIDE 40 MG PO TABS: 40.0000 mg | ORAL_TABLET | Freq: Every day | ORAL | Status: DC

## 2012-09-28 MED ORDER — ISOSORBIDE MONONITRATE ER 30 MG PO TB24: 30.0000 mg | ORAL_TABLET | Freq: Every day | ORAL | Status: DC

## 2012-09-28 NOTE — Progress Notes (Signed)
The capsule endoscopy was negative for any overt bleeding.  The patient's GI work up is negative.  No further GI intervention at this time.  Recommend Hematology evaluation.  Signing off.

## 2012-09-28 NOTE — Discharge Summary (Signed)
Physician Discharge Summary  [name] [name]:[id number] DOB: 03/27/1956 DOA: 09/06/2012  PCP: [name], MD  Admit date: 09/06/2012 Discharge date: 09/28/2012  Time spent: 40 minutes  Recommendations for Outpatient Follow-up:  1. Will proceed once patient stabilized for d/c (include homehealth, outpatient follow-up instructions, specific recommendations for PCP to follow-up on, etc.)  Discharge Diagnoses:  Active Problems:  Atrial fibrillation with RVR  Chronic systolic CHF (congestive heart failure), NYHA class 3  IMPLANTATION OF DEFIBRILLATOR, HX OF  Iron deficiency anemia, unspecified  CAP (community acquired pneumonia)  Cardiomyopathy, nonischemic  Hypertension  Acute respiratory failure with hypoxia  Warfarin-induced coagulopathy  Mild epistaxis  CKD (chronic kidney disease) stage 2, GFR 60-89 ml/min  Acute renal failure  Thrombocytopenia   Discharge Condition: fair  Diet recommendation: heart healthy  Filed Weights   09/25/12 0457 09/26/12 0442 09/28/12 0553  Weight: 109.135 kg (240 lb 9.6 oz) 108.6 kg (239 lb 6.7 oz) 106.5 kg (234 lb 12.6 oz)    History of present illness:  56 year old patient with long-standing chronic severe systolic dysfunction with an EF of around 35%. He presented on 09/06/2012 with complaints of dyspnea, cough with productive sputum, fevers and chills felt to be related to pneumonia. He was also found to be anemic. Previous hemoglobin in 2008 was 13 and was down to just above 8. He had persistent fevers and worsening respiratory status after admission so was transferred to the ICU on 09/09/2012. PCCM assumed care of the patient because of respiratory difficulties. Since admission patient developed issues with atrial fibrillation and RVR that required cardiology consultation and the patient rate had been stabilized on diltiazem drip and he had subsequently been converted to oral Cardizem. He also continued to have respiratory problems that  were associated with superimposed heart failure presumed secondary to stressors of chronic anemia and pneumonia. Because of this cardiology has started the patient on a milrinone infusion. [facility] reassumed care of this patient on 09/14/2012 but he once again decompensated from a respiratory standpoint and required transfer back to the ICU on the same date. It was felt his respiratory symptoms were related to aspiration from recent severe epistaxis. He subsequently restarted her eyes to it was deemed once again appropriate to transfer to the step down unit. Patient eventually got stable enough, and his IV rate controlling agents d/c on 11.2-he was trnasferred back to [facility] service 11.3   Hospital Course:  1. Severe community-acquired pneumonia/Acute hypoxic respiratory failure:  Patient presented with chills, and a productive cough. CXR demonstrated bilateral patchy airspace opacities, right greater than left, and he had a leukocytosis of 12.0. He was managed initially, with iv rocephin/Azithromycin, but airspace disease progressed, he continued to spike pyrexia, and on 09/09/12, spiked as high as 103.5 degrees Farenheit, with a tachypnea of 30-40/min and Oxygen saturation in the 80s. Antibiotics were broadened to include Vancomycin and Cefepime, while Rocephin was discontinued. He was transferred to ICU on that date. Clinically, he has had a rather turbulent clinical course, but now appears to have turned the corner. Follow up CXR on 09/18/2012 was without significant improvement, but clinically, pneumonia has improved greatly, wcc had normalized at 8.5 as of 06/21/12, and he is now saturating at 98%-100% off oxygen. He did have an episode of aspiration/Pneumonitis on 09/14/2012, related to episode of epistaxis, but this has since resolved. SLP has cleared for diet and patient is tolerating a mechanical soft diet. Repeat CXR on 09/23/12 showed that right lower lobe air space disease may  be slightly improved.  There is new left lower lobe air space disease. It is clear that patient has recurrent silent aspiration. He has been afebrile since 09/23/12. Will treat with an additional 7 days of oral Augumentin, to be concluded on 10/01/12.   2. Chronic systolic congestive heart failure/Non-ischemic cardiomyopathy:  Patient has a known history of chronic systolic CHF, nonischemic cardiomyopathy, cardiac arrest (1998), s/p AICD. He initially appeared compensated on admission, but became acutely dyspneic, on 09/07/12, after completion of blood transfusion. CXR confirmed volume overload, and patient was managed aggressively with iv Lasix. Rothbart provided cardiology consultation and ICD check was carried out by Dr Hillis Range, EPS, on 09/08/12. ICD interrogation was reviewed and declared normal. Cardiology team has continued to follow and have been very helpful in managing decompensated heart failure,in this complicated patient.  As of 09/21/12, he appears clinically better compensated. Will need to follow-up with Gene Serpe in [facility] cardiology [place] office 2 weeks post discharge.   3. Atrial fibrillation-rate controlled in 150's  On 09/08/12, patient went into fast atrial fibrillation, with HR in the 130s-140s. He was started on iv Cardizem, and iv Amiodarone was Added on 09/09/12, because of unremitting tachycardia, after consultation with Cardiologist. Over the course of hospitalization, HR has improved, and rate-control has now been achieved with oral Cardizem 180-which was inc to 240 daily [increased 09/27/12] and Coreg 12.5 bid.   4. Hypertension:  BP is well controlled at this time, on lasix 40 qd,Lisinopril 5, Cardizem 240 and beta-blocker coreg 12.5 bid--Cut back dose of Imdur from 60-->30 11/13.   5. Hyperlipidemia: On statin- Pravacol 40 daily   6. Acute on chronic renal failure:  Baseline creatinine 1.2. Renal function appears stable at this time.   7. Anemia of chronic disease/acute blood loss anemia  due to epistaxis:  Severe, normocytic. Patient presented with a hemoglobin of 7.5, denied black stools, hematochezia or hematemesis. FOBT was negative, although hematinic studies have confirmed iron deficiency. He was transfused 2 units PRBC on 09/07/12, with bump in HB to 8.4, and was placed on empiric Protonix. Dr Jeani Hawking provided GI consultation, and has opined that patient is high risk for EGD/Colonoscopy, given his comorbidities. Anticoagulation was placed on hold, against a background of supra-therapeutic INR. 5 mg Vit K was given iv on 09/07/12, and INR has since drifted down.  An attempt to restart anticoagulation, resulted in epistaxis, with further worsening of anemia. He has had a total of 6 units PRBC so far, with the last 2, administered today, for a hemoglobin of 6.4. Per Dr Dietrich Pates, patient is at substantial risk for thrombembolism, so the matter has to be revisited. , Dr Melvia Heaps, gastroenterologist, was re-consulted on 09/24/12, and Dr Jeani Hawking performed EGD on 09/25/12, which revealed only a sliding hiatal hernia and a nonbleeding small duodenal AVM. Colonoscopy 09/26/12 showed 2 small polyps and Capsule endoscopy showed no significant bleed. He is cuurrently recieived another 2 U PRBC now and we will review labs in am   8. Warfarin-induced Coagulopathy:  See above discussion. '  9. Suspected sleep apnea:  Patient may benefit from outpatient sleep study.       Consultants:  Dr [place] Bing, cardiologist.  Dr Jeani Hawking, GI.  Dr Wynelle Cleveland Roma, PCCM.   Procedures:  Serial CXRs.  Multiple GI studies including capsule, endoscopy and colonoscopy (see above)  Antibiotics:   Cefepime 09/10/12-09/19/12   Vancomycin 09/10/12-09/19/12  10/24 Transfuse 2 units PRBC   10/25 Recurrent fever, hypoxia  10/26 Transfer to ICU  11/2 transfused 2 U PRBC  11/3 transferred back to SDU     Discharge Exam: Filed Vitals:   09/28/12 0755 09/28/12 1028 09/28/12  1333 09/28/12 1446  BP: 130/58 122/78 124/76   Pulse:  97 82 151  Temp:   98.8 F (37.1 C)   TempSrc:      Resp:   18   Height:      Weight:      SpO2:   95%    Well today No c/o no n/v/cp/sob Ambualted x 2 in hall No dizzy   Discharge Instructions  Discharge Orders    Future Appointments: Provider: Department: Dept Phone: Center:   10/26/2012 8:40 AM Jonelle Sidle, MD [facility] (near [place]) [phone] [email or web address]       Medication List     As of 09/28/2012  5:56 PM    STOP taking these medications         metoprolol 100 MG tablet   Commonly known as: LOPRESSOR      TAKE these medications         amoxicillin-clavulanate 875-125 MG per tablet   Commonly known as: AUGMENTIN   Take 1 tablet by mouth every 12 (twelve) hours.      carvedilol 12.5 MG tablet   Commonly known as: COREG   Take 1 tablet (12.5 mg total) by mouth 2 (two) times daily with a meal.      diltiazem 240 MG 24 hr capsule   Commonly known as: CARDIZEM CD   Take 1 capsule (240 mg total) by mouth daily.      ferrous sulfate 325 (65 FE) MG tablet   Take 1 tablet (325 mg total) by mouth 2 (two) times daily with a meal.      finasteride 5 MG tablet   Commonly known as: PROSCAR   Take 5 mg by mouth daily.      furosemide 40 MG tablet   Commonly known as: LASIX   Take 1 tablet (40 mg total) by mouth daily.      isosorbide mononitrate 30 MG 24 hr tablet   Commonly known as: IMDUR   Take 1 tablet (30 mg total) by mouth daily.      lisinopril 10 MG tablet   Commonly known as: PRINIVIL,ZESTRIL   Take 0.5 tablets (5 mg total) by mouth daily.      polyethylene glycol powder powder   Commonly known as: GLYCOLAX/MIRALAX   Take 17 g by mouth daily as needed. For constipation - mix with 8 oz liquid and drink      potassium chloride 10 MEQ CR capsule   Commonly known as: MICRO-K   Take 1 capsule (10 mEq total) by mouth daily.      pravastatin 40 MG tablet   Commonly known  as: PRAVACHOL   Take 40 mg by mouth at bedtime.      Vitamin D3 2000 UNITS capsule   Take 2,000 Units by mouth daily.      warfarin 3 MG tablet   Commonly known as: COUMADIN   Take 3 mg by mouth See admin instructions. Take with 5 mg tablet on Wednesday and Friday          The results of significant diagnostics from this hospitalization (including imaging, microbiology, ancillary and laboratory) are listed below for reference.    Significant Diagnostic Studies: Dg Chest 2 View  09/23/2012  *RADIOLOGY REPORT*  Clinical Data: Pneumonia, productive cough  CHEST -  2 VIEW  Comparison: Chest radiograph 09/18/2012  Findings: Left-sided pacemaker overlies stable enlarged heart silhouette.  There is some improvement in the air space disease in the right lower lobe.  Left lower lung demonstrates mild increase in atelectasis.  There is a potential new air space disease in left lower lobe additionally.  Upper lungs relatively clear.  IMPRESSION:  1. Right lower lobe air space disease may be slightly improved. 2.  New left lower lobe air space disease. 3.  Overall pattern of multifocal pneumonia or aspiration pneumonitis.   Original Report Authenticated By: Genevive Bi, M.D.    Dg Chest 2 View  09/06/2012  *RADIOLOGY REPORT*  Clinical Data: Shortness of breath  CHEST - 2 VIEW  Comparison: 05/22/2010  Findings: Cardiomegaly.  Central vascular congestion. Changed left chest wall battery pack with additional lead tip since the prior. Bilateral patchy airspace opacities, right greater than left.  No pleural effusion.  No pneumothorax.  No acute osseous finding. There is a rounded calcific density projecting over the left upper quadrant, perhaps a splenic cyst, similar to prior.  IMPRESSION: Cardiomegaly with central vascular congestion.  Patchy airspace opacities are nonspecific.  Favored to reflect multifocal pneumonia versus pulmonary edema. Recommend radiograph follow-up after appropriate management to  document resolution.   Original Report Authenticated By: Waneta Martins, M.D.    Dg Chest Port 1 View  09/18/2012  *RADIOLOGY REPORT*  Clinical Data: Follow up infiltrate  PORTABLE CHEST - 1 VIEW  Comparison: 09/15/2012  Findings: Stable cardiomegaly.  Heterogeneous opacities throughout the right lung are stable.  Left subclavian AICD device unchanged. No pneumothorax.  Minimal airspace disease at the left base has nearly resolved.  IMPRESSION: Improved airspace disease at the left base.  Stable airspace disease throughout the right lung.   Original Report Authenticated By: Jolaine Click, M.D.    Dg Chest Port 1 View  09/15/2012  *RADIOLOGY REPORT*  Clinical Data: Respiratory failure  PORTABLE CHEST - 1 VIEW  Comparison: Yesterday  Findings: Stable cardiomegaly.    Stable patchy airspace disease throughout the right lung and left base.  No pneumothorax.  Stable left subclavian AICD device.  IMPRESSION: Stable bilateral airspace disease right greater than left.   Original Report Authenticated By: Jolaine Click, M.D.    Dg Chest Port 1 View  09/14/2012  *RADIOLOGY REPORT*  Clinical Data: Respiratory failure.  Pulmonary infiltrate.  PORTABLE CHEST - 1 VIEW 4:46 p.m.  Comparison: 09/14/2012 at 4:54 p.m., 10/30 and 09/12/2012  Findings: There has been partial clearing of the diffuse infiltrate in the right lung, most prominent at the base.  Chronic cardiomegaly.  Pulmonary vascularity is normal.  Left lung is clear.  Transvenous defibrillator in place.  IMPRESSION: Partial clearing of the infiltrate in the right lung.   Original Report Authenticated By: Francene Boyers, M.D.    Dg Chest Port 1 View  09/14/2012  *RADIOLOGY REPORT*  Clinical Data: Shortness of breath, pneumonia  PORTABLE CHEST - 1 VIEW  Comparison: 09/13/2012  Findings: Multifocal patchy right lung opacities, suspicious for pneumonia, grossly unchanged.  Superimposed mild interstitial edema is suspected.  Patchy left basilar atelectasis.  No  pneumothorax.  Cardiomegaly.  Left subclavian ICD.  IMPRESSION: Multifocal patchy right lung opacities, suspicious for pneumonia, grossly unchanged.  Superimposed mild interstitial edema is suspected.   Original Report Authenticated By: Charline Bills, M.D.    Dg Chest Port 1 View  09/13/2012  *RADIOLOGY REPORT*  Clinical Data: Shortness of breath.  Pneumonia on the right.  PORTABLE  CHEST - 1 VIEW  Comparison: Chest 09/11/2012 and 09/12/2012.  Findings: Extensive airspace disease throughout the right chest which appears worst in the base is unchanged.  There is also some new left basilar airspace opacity.  Cardiomegaly is noted.  No pneumothorax or pleural effusion.  IMPRESSION: No change in right side airspace disease.  New mild appearing left basilar opacity is likely atelectasis.   Original Report Authenticated By: Bernadene Bell. Maricela Curet, M.D.    Dg Chest Port 1 View  09/12/2012  *RADIOLOGY REPORT*  Clinical Data: Shortness of breath.  Pneumonia.  PORTABLE CHEST - 1 VIEW  Comparison: Chest 09/09/2012 and 09/11/2012.  Findings: Extensive airspace disease throughout the right chest does not appear changed since yesterday's study.  There is better visualization of the left hemidiaphragm compatible with improved aeration on today's exam.  Marked enlargement of the cardiopericardial silhouette is again seen.  IMPRESSION:  1.  No change in extensive right side airspace disease most consistent with pneumonia. 2.  Improved left basilar aeration. 3.  Marked enlargement cardiopericardial silhouette compatible with cardiomegaly and/or pericardial effusion.   Original Report Authenticated By: Bernadene Bell. Maricela Curet, M.D.    Dg Chest Port 1 View  09/11/2012  *RADIOLOGY REPORT*  Clinical Data: Follow up pneumonia, shortness of breath.  PORTABLE CHEST - 1 VIEW  Comparison: 09/09/2012.  Findings: Trachea is midline.  Pacemaker and AICD lead tips are stable in position.  Heart is enlarged.  There is diffuse bilateral air  space disease, right greater than left.  No definite pleural fluid.  IMPRESSION: Diffuse bilateral air space disease, right greater than left, possibly due to edema.  Developing pneumonia is another consideration.   Original Report Authenticated By: Reyes Ivan, M.D.    Dg Chest Port 1 View  09/09/2012  *RADIOLOGY REPORT*  Clinical Data: Congestive heart failure and pneumonia.  PORTABLE CHEST - 1 VIEW  Comparison: One-view chest 09/07/2012.  Findings: Cardiac enlargement is again noted.  There is further loss of lung volumes.  Progressive bibasilar airspace opacities are noted.  Mild edema is stable.  Bilateral pleural effusions are suspected.  IMPRESSION:  1.  Continued volume loss and progressive bibasilar airspace disease.  This may represent worsening atelectasis and edema although bronchopneumonia is not excluded. 2.  Persistent interstitial disease compatible with edema and congestive heart failure.   Original Report Authenticated By: Jamesetta Orleans. MATTERN, M.D.    Dg Chest Port 1 View  09/07/2012  *RADIOLOGY REPORT*  Clinical Data: 56 year old male respiratory distress, right lower chest pain and productive cough.  PORTABLE CHEST - 1 VIEW  Comparison: 09/06/2012 and earlier.  Findings: Semi a upright AP portable view 1412 hours.  Stable cardiomegaly.  Left chest cardiac AICD again noted.  No definite effusion.  Patchy right lung opacity has increased in confluent. Less pronounced left perihilar opacity is stable or slightly increased.  No pneumothorax. Visualized tracheal air column is within normal limits.  IMPRESSION: Interval increased confluence of patchy right lung opacity.  Lesser left perihilar opacity is more stable.  Favor multilobar infection over asymmetric edema at this point.   Original Report Authenticated By: Harley Hallmark, M.D.     Microbiology: Recent Results (from the past 240 hour(s))  CULTURE, EXPECTORATED SPUTUM-ASSESSMENT     Status: Normal   Collection Time    09/19/12 11:30 PM      Component Value Range Status Comment   Specimen Description SPUTUM   Final    Special Requests NONE   Final  Sputum evaluation     Final    Value: THIS SPECIMEN IS ACCEPTABLE. RESPIRATORY CULTURE REPORT TO FOLLOW.   Report Status 09/20/2012 FINAL   Final   CULTURE, RESPIRATORY     Status: Normal   Collection Time   09/19/12 11:30 PM      Component Value Range Status Comment   Specimen Description SPUTUM   Final    Special Requests NONE   Final    Gram Stain     Final    Value: ABUNDANT WBC PRESENT, PREDOMINANTLY PMN     NO SQUAMOUS EPITHELIAL CELLS SEEN     NO ORGANISMS SEEN   Culture FEW CANDIDA ALBICANS   Final    Report Status 09/22/2012 FINAL   Final   CULTURE, BLOOD (ROUTINE X 2)     Status: Normal   Collection Time   09/20/12  4:58 PM      Component Value Range Status Comment   Specimen Description BLOOD LEFT HAND   Final    Special Requests BOTTLES DRAWN AEROBIC ONLY 5CC   Final    Culture  Setup Time 09/20/2012 23:51   Final    Culture NO GROWTH 5 DAYS   Final    Report Status 09/26/2012 FINAL   Final   CULTURE, BLOOD (ROUTINE X 2)     Status: Normal   Collection Time   09/20/12  5:05 PM      Component Value Range Status Comment   Specimen Description BLOOD RIGHT ARM   Final    Special Requests BOTTLES DRAWN AEROBIC AND ANAEROBIC 10CC   Final    Culture  Setup Time 09/20/2012 23:50   Final    Culture NO GROWTH 5 DAYS   Final    Report Status 09/26/2012 FINAL   Final      Labs: Basic Metabolic Panel:  Lab 09/28/12 9147 09/27/12 0432 09/26/12 0440 09/25/12 0530 09/24/12 0600  NA 134* 135 135 134* 137  K 3.9 4.0 4.2 4.2 4.7  CL 100 102 100 102 105  CO2 23 23 25 25 24   GLUCOSE 103* 88 89 107* 98  BUN 18 19 23  28* 29*  CREATININE 1.44* 1.34 1.34 1.32 1.31  CALCIUM 9.3 8.8 9.1 9.0 9.2  MG -- -- -- -- --  PHOS -- -- -- -- --   Liver Function Tests:  Lab 09/22/12 0455  AST 50*  ALT 46  ALKPHOS 124*  BILITOT 1.0  PROT 8.3  ALBUMIN 2.0*    No results found for this basename: LIPASE:5,AMYLASE:5 in the last 168 hours No results found for this basename: AMMONIA:5 in the last 168 hours CBC:  Lab 09/28/12 0548 09/27/12 0432 09/26/12 0440 09/25/12 0530 09/24/12 0600  WBC 8.7 7.1 9.4 8.3 8.6  NEUTROABS -- -- -- -- --  HGB 8.4* 6.7* 7.3* 7.2* 7.4*  HCT 25.9* 20.5* 22.6* 22.6* 23.0*  MCV 85.2 85.8 85.9 84.6 84.6  PLT 242 220 262 246 260   Cardiac Enzymes: No results found for this basename: CKTOTAL:5,CKMB:5,CKMBINDEX:5,TROPONINI:5 in the last 168 hours BNP: BNP (last 3 results)  Basename 09/10/12 0445 09/09/12 0354 09/08/12 0306  PROBNP 5889.0* 4957.0* 6800.0*   CBG: No results found for this basename: GLUCAP:5 in the last 168 hours     Signed:  Rhetta Mura  [facility] 09/28/2012, 5:56 PM

## 2012-09-28 NOTE — Clinical Social Work Note (Addendum)
Clinical Social Worker will continue to follow for discharge planning needs.   12:05pm CSW followed up with Tresa Endo at [facility] and at the moment they still have availability for the patient, with the anticipation of discharge tomorrow.   Rozetta Nunnery MSW, LCSWA ([place]) [phone]

## 2012-09-28 NOTE — Progress Notes (Signed)
Physical Therapy Treatment Patient Details Name: [name] [name] MRN: [id number] DOB: 06-21-1956 Today's Date: 09/28/2012 Time: 4132-4401 PT Time Calculation (min): 24 min  PT Assessment / Plan / Recommendation Comments on Treatment Session  Notable improvements in all mobility.  Still some instability with gait, but generally safe with the RW/    Follow Up Recommendations  SNF     Does the patient have the potential to tolerate intense rehabilitation  No, Recommend SNF  Barriers to Discharge        Equipment Recommendations  None recommended by PT    Recommendations for Other Services    Frequency Min 3X/week   Plan Discharge plan remains appropriate    Precautions / Restrictions Precautions Precautions: Fall (minimal risk) Restrictions Weight Bearing Restrictions: No   Pertinent Vitals/Pain EHR up to 151 max, but generally in 130's even during trial of stairs.  Pt asymptomatic    Mobility  Bed Mobility Bed Mobility: Supine to Sit;Sit to Supine Supine to Sit: 7: Independent;HOB flat Sitting - Scoot to Edge of Bed: 7: Independent Sit to Supine: 7: Independent;HOB flat Transfers Transfers: Sit to Stand;Stand to Sit Sit to Stand: 5: Supervision;With upper extremity assist;From chair/3-in-1;From bed Stand to Sit: 5: Supervision;With upper extremity assist;To chair/3-in-1;To bed Stand Pivot Transfers: 5: Supervision Details for Transfer Assistance: Pt. requires cues for safe walker use when negotiating tight environments Ambulation/Gait Ambulation/Gait Assistance: 5: Supervision Ambulation Distance (Feet): 260 Feet (with 2 standing rest breaks to monitor HR) Assistive device: None;Rolling walker Ambulation/Gait Assistance Details: mildly unsteady at times, more guard without use of assistive device Gait Pattern: Decreased step length - right;Decreased step length - left;Decreased stride length Stairs: Yes Stairs Assistance: 4: Min guard Stair Management Technique:  One rail Right;Alternating pattern;One rail Left;Forwards;Backwards Number of Stairs: 2  Wheelchair Mobility Wheelchair Mobility: No    Exercises General Exercises - Upper Extremity Shoulder Flexion: Self ROM;AAROM;10 reps;Right;Seated (10 reps AAROM; instructed in self ROM)   PT Diagnosis:    PT Problem List:   PT Treatment Interventions:     PT Goals Acute Rehab PT Goals Time For Goal Achievement: 09/27/12 Potential to Achieve Goals: Good PT Goal: Supine/Side to Sit - Progress: Met PT Goal: Sit to Supine/Side - Progress: Met PT Goal: Sit to Stand - Progress: Met PT Goal: Stand to Sit - Progress: Met PT Goal: Ambulate - Progress: Met  Visit Information  Last PT Received On: 09/28/12 Assistance Needed: +1    Subjective Data  Subjective: My heart did better when I went with the therapist earlier...no I don't feel anything   Cognition  Overall Cognitive Status: Appears within functional limits for tasks assessed/performed Arousal/Alertness: Awake/alert Orientation Level: Appears intact for tasks assessed Behavior During Session: [facility] for tasks performed Current Attention Level: Selective (or better)    Balance  Balance Balance Assessed: Yes Static Sitting Balance Static Sitting - Balance Support: No upper extremity supported Static Sitting - Level of Assistance: 5: Stand by assistance Static Standing Balance Static Standing - Balance Support: No upper extremity supported;During functional activity Static Standing - Level of Assistance: 5: Stand by assistance  End of Session PT - End of Session Activity Tolerance: Patient tolerated treatment well Patient left: in chair;with call bell/phone within reach Nurse Communication: Mobility status   GP     [name], Eliseo Gum 09/28/2012, 5:04 PM

## 2012-09-28 NOTE — Progress Notes (Signed)
Occupational Therapy Treatment Patient Details Name: [name] [name] MRN: [id number] DOB: [birth year] Today's Date: 09/28/2012 Time: 4098-1191 OT Time Calculation (min): 23 min  OT Assessment / Plan / Recommendation Comments on Treatment Session Pt. with improving activity tolerance, improving independence with BADLs.  Rt shoulder flexion still limited actively    Follow Up Recommendations  SNF    Barriers to Discharge       Equipment Recommendations  None recommended by OT    Recommendations for Other Services    Frequency Min 2X/week   Plan Discharge plan remains appropriate    Precautions / Restrictions Precautions Precautions: Fall Restrictions Weight Bearing Restrictions: No   Pertinent Vitals/Pain     ADL  Lower Body Dressing: Min guard Where Assessed - Lower Body Dressing: Supported sit to stand Transfers/Ambulation Related to ADLs: Pt. ambulates in hallway with min guard assist ADL Comments: Pt. doing well with BADLs - progressing to min guard assist    OT Diagnosis:    OT Problem List:   OT Treatment Interventions:     OT Goals Acute Rehab OT Goals OT Goal Formulation: With patient Time For Goal Achievement: 10/10/12 Potential to Achieve Goals: Good ADL Goals ADL Goal: Eating - Progress: Met ADL Goal: Grooming - Progress: Progressing toward goals ADL Goal: Lower Body Dressing - Progress: Progressing toward goals Additional ADL Goal #1: Pt. will be independent with HEP for Rt. shoulder ROM ADL Goal: Additional Goal #1 - Progress: Goal set today Miscellaneous OT Goals OT Goal: Miscellaneous Goal #1 - Progress: Met  Visit Information  Last OT Received On: 09/28/12 Assistance Needed: +1    Subjective Data      Prior Functioning       Cognition  Overall Cognitive Status: Appears within functional limits for tasks assessed/performed Arousal/Alertness: Awake/alert Orientation Level: Appears intact for tasks assessed Behavior During Session: [facility]  for tasks performed    Mobility  Shoulder Instructions Transfers Transfers: Sit to Stand;Stand to Sit Sit to Stand: 4: Min guard;From chair/3-in-1;With upper extremity assist Stand to Sit: 4: Min guard;To chair/3-in-1;With upper extremity assist Details for Transfer Assistance: Pt. requires cues for safe walker use when negotiating tight environments       Exercises  General Exercises - Upper Extremity Shoulder Flexion: Self ROM;AAROM;10 reps;Right;Seated (10 reps AAROM; instructed in self ROM)   Balance Balance Balance Assessed: Yes Static Standing Balance Static Standing - Balance Support: No upper extremity supported Static Standing - Level of Assistance: 5: Stand by assistance   End of Session OT - End of Session Activity Tolerance: Patient tolerated treatment well Patient left: in chair;with call bell/phone within reach Nurse Communication: Other (comment) (HR rate)  GO     [name] [name] M 09/28/2012, 2:54 PM

## 2012-09-28 NOTE — Progress Notes (Signed)
Pt ambulated 200 feet with RN and rolling walker; steady gait; HR up to 170 while ambulating; pt asymptomatic; pt assisted back to chair; call bell w/i reach; HR down to 112 with rest; will cont. To monitor.

## 2012-09-29 LAB — CBC WITH DIFFERENTIAL/PLATELET
Basophils Absolute: 0 10*3/uL (ref 0.0–0.1)
Basophils Relative: 0 % (ref 0–1)
Eosinophils Absolute: 0.1 10*3/uL (ref 0.0–0.7)
MCH: 27.4 pg (ref 26.0–34.0)
MCHC: 32.1 g/dL (ref 30.0–36.0)
Monocytes Absolute: 0.6 10*3/uL (ref 0.1–1.0)
Neutro Abs: 5 10*3/uL (ref 1.7–7.7)
Neutrophils Relative %: 76 % (ref 43–77)
RDW: 15.8 % — ABNORMAL HIGH (ref 11.5–15.5)

## 2012-09-29 NOTE — Discharge Summary (Signed)
Physician Discharge Summary  [name] [name] ZHY:[id number] DOB: 21-Jan-1956 DOA: 09/06/2012  PCP: [name] Boston, MD  Admit date: 09/06/2012 Discharge date: 09/29/2012  Time spent: 40 minutes  Recommendations for Outpatient Follow-up:  [name]. Needs basic metabolic panel and CBC in 3 days 2. Consider hematology consult if hemoglobin remains below 7 persistently 3. Please complete the full course of Augmentin scheduled to complete 10/01/2012 4. Recommend outpatient speech therapy evaluation-hospitalized mainly for silent aspiration resulting in severe community acquired/aspiration pneumonia 5. Will require ICD check probably in about 6 months with electrophysiologist Dr. Hillis [name] 6.  Please consider outpatient sleep study for sleep apnea   Discharge Diagnoses:  Active Problems:  Atrial fibrillation with RVR  Chronic systolic CHF (congestive heart failure), NYHA class 3  IMPLANTATION OF DEFIBRILLATOR, HX OF  Iron deficiency anemia, unspecified  CAP (community acquired pneumonia)  Cardiomyopathy, nonischemic  Hypertension  Acute respiratory failure with hypoxia  Warfarin-induced coagulopathy  Mild epistaxis  CKD (chronic kidney disease) stage 2, GFR 60-89 ml/min  Acute renal failure  Thrombocytopenia   Discharge Condition: fair  Diet recommendation: heart healthy-requires a dysphagia 3 mechanical soft with thin liquid diet  Filed Weights   09/26/12 0442 09/28/12 0553 09/29/12 0611  Weight: 108.6 kg (239 lb 6.7 oz) 106.5 kg (234 lb 12.6 oz) 107.2 kg (236 lb 5.3 oz)    History of present illness:  56 year old patient with long-standing chronic severe systolic dysfunction with an EF of around 35%. He presented on 09/06/2012 with complaints of dyspnea, cough with productive sputum, fevers and chills felt to be related to pneumonia. He was also found to be anemic. Previous hemoglobin in 2008 was 13 and was down to just above 8. He had persistent fevers and worsening respiratory  status after admission so was transferred to the ICU on 09/09/2012. [name] [name] care of the patient because of respiratory difficulties.  Since admission patient developed issues with atrial fibrillation and RVR that required cardiology consultation and the patient rate had been stabilized on diltiazem drip and he had subsequently been converted to oral Cardizem. He also continued to have respiratory problems that were associated with superimposed heart failure presumed secondary to stressors of chronic anemia and pneumonia. Because of this cardiology has started the patient on a milrinone infusion.  [name] [name] reassumed care of this patient on 09/14/2012 but he once again decompensated from a respiratory standpoint and required transfer back to the ICU on the same date. It was felt his respiratory symptoms were related to aspiration from recent severe epistaxis. He subsequently restarted her eyes to it was deemed once again appropriate to transfer to the step down unit. Patient eventually got stable enough, and his IV rate controlling agents d/c on 11.2-he was transferred back to [facility] service 11.3 Subsequent to this the patient was deemed stable enough to determine what was the etiology of the patient's anemia-the subcutaneous multifactorial and potentially secondary to coagulopathy from supratherapeutic INR however he had a full an extensive gastrointestinal workup as per below which did not reveal any one specific reason. He was transfused multiple times this admission with a baseline hemoglobin seeming to be around 8. Please see below for further details   Hospital Course:  [name]. Severe community-acquired pneumonia/Acute hypoxic respiratory failure:  Patient presented with chills, and a productive cough. CXR demonstrated bilateral patchy airspace opacities, right greater than left, and he had a leukocytosis of 12.0. He was managed initially, with iv rocephin/Azithromycin, but airspace disease  progressed, he continued to spike  pyrexia, and on 09/09/12, spiked as high as 103.5 degrees Farenheit, with a tachypnea of 30-40/min and Oxygen saturation in the 80s. Antibiotics were broadened to include Vancomycin and Cefepime, while Rocephin was discontinued. He was transferred to ICU on that date. Clinically, he has had a rather turbulent clinical course, but now appears to have turned the corner. Follow up CXR on 09/18/2012 was without significant improvement, but clinically, pneumonia has improved greatly, wcc had normalized at 8.5 as of 54/7/13, and he is now saturating at 98%-100% off oxygen. He did have an episode of aspiration/Pneumonitis on 09/14/2012, related to episode of epistaxis, but this has since resolved. SLP has cleared for diet and patient is tolerating a mechanical soft diet. Repeat CXR on 09/23/12 showed that right lower lobe air space disease may be slightly improved. There is new left lower lobe air space disease. It is clear that patient has recurrent silent aspiration. He has been afebrile since 09/23/12. Will treat with an additional 7 days of oral Augumentin, to be concluded on 10/01/12.   2. Chronic systolic congestive heart failure/Non-ischemic cardiomyopathy:  Patient has a known history of chronic systolic CHF, nonischemic cardiomyopathy, cardiac arrest (1998), s/p AICD. He initially appeared compensated on admission, but became acutely dyspneic, on 09/07/12, after completion of blood transfusion. CXR confirmed volume overload, and patient was managed aggressively with iv Lasix. [name] [name] provided cardiology consultation and ICD check was carried out by Dr [name] [name], EPS, on 09/08/12. ICD interrogation was reviewed and declared normal. Cardiology team has continued to follow and have been very helpful in managing decompensated heart failure,in this complicated patient.  As of 09/21/12, he appears clinically better compensated. Will need to follow-up with [name] [name] in [facility]  cardiology [place] office 2 weeks post discharge.   3. Atrial fibrillation-rate controlled in 150's  On 09/08/12, patient went into fast atrial fibrillation, with HR in the 130s-140s. He was started on iv Cardizem, and iv Amiodarone was Added on 09/09/12, because of unremitting tachycardia, after consultation with Cardiologist. Over the course of hospitalization, HR has improved, and rate-control has now been achieved with oral Cardizem 180-which was inc to 240 daily [increased 09/27/12] and Coreg 12.5 bid.   4. Hypertension:  BP is well controlled at this time, on lasix 40 qd,Lisinopril 5, Cardizem 240 and beta-blocker coreg 12.5 bid--Cut back dose of Imdur from 60-->30 11/13.   5. Hyperlipidemia: On statin- Pravacol 40 daily   6. Acute on chronic renal failure:  Baseline creatinine [name].2. Renal function appears stable at this time.   7. Anemia of chronic disease/acute blood loss anemia due to epistaxis:  Severe, normocytic. Patient presented with a hemoglobin of 7.5, denied black stools, hematochezia or hematemesis. FOBT was negative, although hematinic studies have confirmed iron deficiency. He was transfused 2 units PRBC on 09/07/12, with bump in HB to 8.4, and was placed on empiric Protonix. Dr Jeani Hawking provided GI consultation, and has opined that patient is high risk for EGD/Colonoscopy, given his comorbidities. Anticoagulation was placed on hold, against a background of supra-therapeutic INR. 5 mg Vit K was given iv on 09/07/12, and INR has since drifted down.  An attempt to restart anticoagulation, resulted in epistaxis, with further worsening of anemia. He has had a total of 6 units PRBC so far, with the last 2, administered today, for a hemoglobin of 6.4. Per Dr Dietrich Pates, patient is at substantial risk for thrombembolism, so the matter has to be revisited. , Dr Melvia Heaps, gastroenterologist, was re-consulted on 09/24/12, and  Dr Jeani Hawking performed EGD on 09/25/12, which revealed only  a sliding hiatal hernia and a nonbleeding small duodenal AVM. Colonoscopy 09/26/12 showed 2 small polyps and Capsule endoscopy showed no significant bleed. He is cuurrently recieived another 2 U PRBC now and we will review labs in am   8. Warfarin-induced Coagulopathy:  See above discussion. '  9. Suspected sleep apnea:  Patient may benefit from outpatient sleep study.    Consultants:  Dr [place] Bing, cardiologist.  Dr Jeani Hawking, GI.  Dr Wynelle Cleveland Roma, [name] [name].   Procedures:  Serial CXRs.  Multiple GI studies including capsule, endoscopy and colonoscopy (see above)  Antibiotics:   Cefepime 09/10/12-09/19/12   Vancomycin 09/10/12-09/19/12  Significant events  10/24 Transfuse 2 units PRBC   10/25 Recurrent fever, hypoxia   10/26 Transfer to ICU  11/2 transfused 2 U PRBC  11/3 transferred back to SDU  Transfer to the floor 11/5 2013   Discharge Exam: Filed Vitals:   09/28/12 1333 09/28/12 1446 09/28/12 2100 09/29/12 0611  BP: 124/76  116/66 123/60  Pulse: 82 151 73 92  Temp: 98.8 F (37.[name] C)  99.2 F (37.3 C) 97.4 F (36.3 C)  TempSrc:   Oral Oral  Resp: 18  18 18   Height:      Weight:    107.2 kg (236 lb 5.3 oz)  SpO2: 95%  96% 100%   Well today No c/o no n/v/cp/sob Ambualted x 2 in hall No dizzy   Discharge Instructions      Discharge Orders    Future Appointments: Provider: Department: Dept Phone: Center:   10/26/2012 8:40 AM Jonelle Sidle, MD [facility] ([facility] [place] [phone] [id number]       Medication List     As of 09/29/2012  8:13 AM    STOP taking these medications         metoprolol 100 MG tablet   Commonly known as: LOPRESSOR      TAKE these medications         amoxicillin-clavulanate 875-125 MG per tablet   Commonly known as: AUGMENTIN   Take [name] tablet by mouth every 12 (twelve) hours.      carvedilol 12.5 MG tablet   Commonly known as: COREG   Take [name] tablet (12.5 mg total) by mouth 2 (two) times  daily with a meal.      diltiazem 240 MG 24 hr capsule   Commonly known as: CARDIZEM CD   Take [name] capsule (240 mg total) by mouth daily.      ferrous sulfate 325 (65 FE) MG tablet   Take [name] tablet (325 mg total) by mouth 2 (two) times daily with a meal.      finasteride 5 MG tablet   Commonly known as: PROSCAR   Take 5 mg by mouth daily.      furosemide 40 MG tablet   Commonly known as: LASIX   Take [name] tablet (40 mg total) by mouth daily.      isosorbide mononitrate 30 MG 24 hr tablet   Commonly known as: IMDUR   Take [name] tablet (30 mg total) by mouth daily.      lisinopril 10 MG tablet   Commonly known as: PRINIVIL,ZESTRIL   Take 0.5 tablets (5 mg total) by mouth daily.      polyethylene glycol powder powder   Commonly known as: GLYCOLAX/MIRALAX   Take 17 g by mouth daily as needed. For constipation - mix with 8 oz liquid and drink  potassium chloride 10 MEQ CR capsule   Commonly known as: MICRO-K   Take [name] capsule (10 mEq total) by mouth daily.      pravastatin 40 MG tablet   Commonly known as: PRAVACHOL   Take 40 mg by mouth at bedtime.      Vitamin D3 2000 UNITS capsule   Take 2,000 Units by mouth daily.      warfarin 3 MG tablet   Commonly known as: COUMADIN   Take 3 mg by mouth See admin instructions. Take with 5 mg tablet on Wednesday and Friday            The results of significant diagnostics from this hospitalization (including imaging, microbiology, ancillary and laboratory) are listed below for reference.    Significant Diagnostic Studies: Dg Chest 2 View  09/23/2012  *RADIOLOGY REPORT*  Clinical Data: Pneumonia, productive cough  CHEST - 2 VIEW  Comparison: Chest radiograph 09/18/2012  Findings: Left-sided pacemaker overlies stable enlarged heart silhouette.  There is some improvement in the air space disease in the right lower lobe.  Left lower lung demonstrates mild increase in atelectasis.  There is a potential new air space disease in left lower  lobe additionally.  Upper lungs relatively clear.  IMPRESSION:  [name]. Right lower lobe air space disease may be slightly improved. 2.  New left lower lobe air space disease. 3.  Overall pattern of multifocal pneumonia or aspiration pneumonitis.   Original Report Authenticated By: Genevive Bi, M.D.    Dg Chest 2 View  09/06/2012  *RADIOLOGY REPORT*  Clinical Data: Shortness of breath  CHEST - 2 VIEW  Comparison: 05/22/2010  Findings: Cardiomegaly.  Central vascular congestion. Changed left chest wall battery pack with additional lead tip since the prior. Bilateral patchy airspace opacities, right greater than left.  No pleural effusion.  No pneumothorax.  No acute osseous finding. There is a rounded calcific density projecting over the left upper quadrant, perhaps a splenic cyst, similar to prior.  IMPRESSION: Cardiomegaly with central vascular congestion.  Patchy airspace opacities are nonspecific.  Favored to reflect multifocal pneumonia versus pulmonary edema. Recommend radiograph follow-up after appropriate management to document resolution.   Original Report Authenticated By: Waneta Martins, M.D.    Dg Chest Port [name] View  09/18/2012  *RADIOLOGY REPORT*  Clinical Data: Follow up infiltrate  PORTABLE CHEST - [name] VIEW  Comparison: 09/15/2012  Findings: Stable cardiomegaly.  Heterogeneous opacities throughout the right lung are stable.  Left subclavian AICD device unchanged. No pneumothorax.  Minimal airspace disease at the left base has nearly resolved.  IMPRESSION: Improved airspace disease at the left base.  Stable airspace disease throughout the right lung.   Original Report Authenticated By: Jolaine Click, M.D.    Dg Chest Port [name] View  11/[name]/2013  *RADIOLOGY REPORT*  Clinical Data: Respiratory failure  PORTABLE CHEST - [name] VIEW  Comparison: Yesterday  Findings: Stable cardiomegaly.    Stable patchy airspace disease throughout the right lung and left base.  No pneumothorax.  Stable left subclavian AICD  device.  IMPRESSION: Stable bilateral airspace disease right greater than left.   Original Report Authenticated By: Jolaine Click, M.D.    Dg Chest Port [name] View  09/14/2012  *RADIOLOGY REPORT*  Clinical Data: Respiratory failure.  Pulmonary infiltrate.  PORTABLE CHEST - [name] VIEW 4:46 p.m.  Comparison: 09/14/2012 at 4:54 p.m., 10/30 and 09/12/2012  Findings: There has been partial clearing of the diffuse infiltrate in the right lung, most prominent at the base.  Chronic cardiomegaly.  Pulmonary vascularity is normal.  Left lung is clear.  Transvenous defibrillator in place.  IMPRESSION: Partial clearing of the infiltrate in the right lung.   Original Report Authenticated By: Francene Boyers, M.D.    Dg Chest Port [name] View  09/14/2012  *RADIOLOGY REPORT*  Clinical Data: Shortness of breath, pneumonia  PORTABLE CHEST - [name] VIEW  Comparison: 09/13/2012  Findings: Multifocal patchy right lung opacities, suspicious for pneumonia, grossly unchanged.  Superimposed mild interstitial edema is suspected.  Patchy left basilar atelectasis.  No pneumothorax.  Cardiomegaly.  Left subclavian ICD.  IMPRESSION: Multifocal patchy right lung opacities, suspicious for pneumonia, grossly unchanged.  Superimposed mild interstitial edema is suspected.   Original Report Authenticated By: Charline Bills, M.D.    Dg Chest Port [name] View  09/13/2012  *RADIOLOGY REPORT*  Clinical Data: Shortness of breath.  Pneumonia on the right.  PORTABLE CHEST - [name] VIEW  Comparison: Chest 09/11/2012 and 09/12/2012.  Findings: Extensive airspace disease throughout the right chest which appears worst in the base is unchanged.  There is also some new left basilar airspace opacity.  Cardiomegaly is noted.  No pneumothorax or pleural effusion.  IMPRESSION: No change in right side airspace disease.  New mild appearing left basilar opacity is likely atelectasis.   Original Report Authenticated By: Bernadene Bell. Maricela Curet, M.D.    Dg Chest Port [name] View  09/12/2012   *RADIOLOGY REPORT*  Clinical Data: Shortness of breath.  Pneumonia.  PORTABLE CHEST - [name] VIEW  Comparison: Chest 09/09/2012 and 09/11/2012.  Findings: Extensive airspace disease throughout the right chest does not appear changed since yesterday's study.  There is better visualization of the left hemidiaphragm compatible with improved aeration on today's exam.  Marked enlargement of the cardiopericardial silhouette is again seen.  IMPRESSION:  [name].  No change in extensive right side airspace disease most consistent with pneumonia. 2.  Improved left basilar aeration. 3.  Marked enlargement cardiopericardial silhouette compatible with cardiomegaly and/or pericardial effusion.   Original Report Authenticated By: Bernadene Bell. Maricela Curet, M.D.    Dg Chest Port [name] View  09/11/2012  *RADIOLOGY REPORT*  Clinical Data: Follow up pneumonia, shortness of breath.  PORTABLE CHEST - [name] VIEW  Comparison: 09/09/2012.  Findings: Trachea is midline.  Pacemaker and AICD lead tips are stable in position.  Heart is enlarged.  There is diffuse bilateral air space disease, right greater than left.  No definite pleural fluid.  IMPRESSION: Diffuse bilateral air space disease, right greater than left, possibly due to edema.  Developing pneumonia is another consideration.   Original Report Authenticated By: Reyes Ivan, M.D.    Dg Chest Port [name] View  09/09/2012  *RADIOLOGY REPORT*  Clinical Data: Congestive heart failure and pneumonia.  PORTABLE CHEST - [name] VIEW  Comparison: One-view chest 09/07/2012.  Findings: Cardiac enlargement is again noted.  There is further loss of lung volumes.  Progressive bibasilar airspace opacities are noted.  Mild edema is stable.  Bilateral pleural effusions are suspected.  IMPRESSION:  [name].  Continued volume loss and progressive bibasilar airspace disease.  This may represent worsening atelectasis and edema although bronchopneumonia is not excluded. 2.  Persistent interstitial disease compatible with edema and  congestive heart failure.   Original Report Authenticated By: Jamesetta Orleans. MATTERN, M.D.    Dg Chest Port [name] View  09/07/2012  *RADIOLOGY REPORT*  Clinical Data: 56 year old male respiratory distress, right lower chest pain and productive cough.  PORTABLE CHEST - [name] VIEW  Comparison: 09/06/2012 and earlier.  Findings: Semi a upright AP portable view 1412 hours.  Stable cardiomegaly.  Left chest cardiac AICD again noted.  No definite effusion.  Patchy right lung opacity has increased in confluent. Less pronounced left perihilar opacity is stable or slightly increased.  No pneumothorax. Visualized tracheal air column is within normal limits.  IMPRESSION: Interval increased confluence of patchy right lung opacity.  Lesser left perihilar opacity is more stable.  Favor multilobar infection over asymmetric edema at this point.   Original Report Authenticated By: Harley Hallmark, M.D.     Microbiology: Recent Results (from the past 240 hour(s))  CULTURE, EXPECTORATED SPUTUM-ASSESSMENT     Status: Normal   Collection Time   09/19/12 11:30 PM      Component Value [name] Status Comment   Specimen Description SPUTUM   Final    Special Requests NONE   Final    Sputum evaluation     Final    Value: THIS SPECIMEN IS ACCEPTABLE. RESPIRATORY CULTURE REPORT TO FOLLOW.   Report Status 09/20/2012 FINAL   Final   CULTURE, RESPIRATORY     Status: Normal   Collection Time   09/19/12 11:30 PM      Component Value [name] Status Comment   Specimen Description SPUTUM   Final    Special Requests NONE   Final    Gram Stain     Final    Value: ABUNDANT WBC PRESENT, PREDOMINANTLY PMN     NO SQUAMOUS EPITHELIAL CELLS SEEN     NO ORGANISMS SEEN   Culture FEW CANDIDA ALBICANS   Final    Report Status 09/22/2012 FINAL   Final   CULTURE, BLOOD (ROUTINE X 2)     Status: Normal   Collection Time   09/20/12  4:58 PM      Component Value [name] Status Comment   Specimen Description BLOOD LEFT HAND   Final    Special Requests  BOTTLES DRAWN AEROBIC ONLY 5CC   Final    Culture  Setup Time 09/20/2012 23:51   Final    Culture NO GROWTH 5 DAYS   Final    Report Status 09/26/2012 FINAL   Final   CULTURE, BLOOD (ROUTINE X 2)     Status: Normal   Collection Time   09/20/12  5:05 PM      Component Value [name] Status Comment   Specimen Description BLOOD RIGHT ARM   Final    Special Requests BOTTLES DRAWN AEROBIC AND ANAEROBIC 10CC   Final    Culture  Setup Time 09/20/2012 23:50   Final    Culture NO GROWTH 5 DAYS   Final    Report Status 09/26/2012 FINAL   Final      Labs: Basic Metabolic Panel:  Lab 09/28/12 1610 09/27/12 0432 09/26/12 0440 09/25/12 0530 09/24/12 0600  NA 134* 135 135 134* 137  K 3.9 4.0 4.2 4.2 4.7  CL 100 102 100 102 105  CO2 23 23 25 25 24   GLUCOSE 103* 88 89 107* 98  BUN 18 19 23  28* 29*  CREATININE [name].44* [name].34 [name].34 [name].32 [name].31  CALCIUM 9.3 8.8 9.[name] 9.0 9.2  MG -- -- -- -- --  PHOS -- -- -- -- --   Liver Function Tests: No results found for this basename: AST:5,ALT:5,ALKPHOS:5,BILITOT:5,PROT:5,ALBUMIN:5 in the last 168 hours No results found for this basename: LIPASE:5,AMYLASE:5 in the last 168 hours No results found for this basename: AMMONIA:5 in the last 168 hours CBC:  Lab 09/29/12 0650 09/28/12 0548  09/27/12 0432 09/26/12 0440 09/25/12 0530  WBC 6.6 8.7 7.[name] 9.4 8.3  NEUTROABS 5.0 -- -- -- --  HGB 7.7* 8.4* 6.7* 7.3* 7.2*  HCT 24.0* 25.9* 20.5* 22.6* 22.6*  MCV 85.4 85.2 85.8 85.9 84.6  PLT 206 242 220 262 246   Cardiac Enzymes: No results found for this basename: CKTOTAL:5,CKMB:5,CKMBINDEX:5,TROPONINI:5 in the last 168 hours BNP: BNP (last 3 results)  Basename 09/10/12 0445 09/09/12 0354 09/08/12 0306  PROBNP 5889.0* 4957.0* 6800.0*   CBG: No results found for this basename: GLUCAP:5 in the last 168 hours     Signed:  Rhetta Mura  [name] 09/29/2012, 8:07 AM

## 2012-09-29 NOTE — Progress Notes (Signed)
See d/c summary from yesterdyaa nd updated one from today No issues No bleeding Ambulated No CP/N/V/SOB  Pleas Koch, MD [facility] [phone]

## 2012-09-29 NOTE — Progress Notes (Signed)
MD in room at this time; Hgb 7.7 this ok; per MD pt ok to d/c to [facility].

## 2012-10-25 ENCOUNTER — Encounter: Payer: Self-pay | Admitting: Cardiology

## 2012-10-26 ENCOUNTER — Encounter: Payer: Self-pay | Admitting: Cardiology

## 2012-10-26 ENCOUNTER — Ambulatory Visit (INDEPENDENT_AMBULATORY_CARE_PROVIDER_SITE_OTHER): Payer: PRIVATE HEALTH INSURANCE | Admitting: Cardiology

## 2012-10-26 VITALS — BP 115/83 | HR 69 | Ht 77.0 in | Wt 236.0 lb

## 2012-10-26 DIAGNOSIS — I10 Essential (primary) hypertension: Secondary | ICD-10-CM

## 2012-10-26 DIAGNOSIS — N182 Chronic kidney disease, stage 2 (mild): Secondary | ICD-10-CM

## 2012-10-26 DIAGNOSIS — Z95810 Presence of automatic (implantable) cardiac defibrillator: Secondary | ICD-10-CM

## 2012-10-26 DIAGNOSIS — I4891 Unspecified atrial fibrillation: Secondary | ICD-10-CM

## 2012-10-26 DIAGNOSIS — I5022 Chronic systolic (congestive) heart failure: Secondary | ICD-10-CM

## 2012-10-26 NOTE — Assessment & Plan Note (Signed)
Clinically stable at this time. Weight is down almost 30 pounds over the last 6 months, although likely related to significant intercurrent illness. He reports no pitting edema, no orthopnea. Continue current diuretic dose and followup BMET for next visit.

## 2012-10-26 NOTE — Addendum Note (Signed)
Addended by: Burnice Logan on: 10/26/2012 01:08 PM   Modules accepted: Orders

## 2012-10-26 NOTE — Progress Notes (Signed)
Clinical Summary [name] [name] is a medically complex 56 y.o.male presenting for followup. He is a former patient of [name] [name], prefers to stay with [name] [name]. He was last seen in June for routine assessment, had followup ICD check with [name] [name] in September.  Record review finds hospitalization back in October with respiratory failure associated with pneumonia. This was complicated by acute renal failure, thrombocytopenia, significant anemia with epistaxis requiring temporary hold on Coumadin, and also atrial fibrillation with rapid ventricular response. He was seen by [name] [name] during hospitalization for cardiology consultation. Echocardiogram done during that stay showed moderately reduced LV function as has been noted previously  Lab work from November showed potassium 3.9, BUN 18, creatinine 1.4, hemoglobin 7.7, platelets 206. Patient states that he has been back on Coumadin, started after having GI workup and during his rehabilitation stay at an SNF. He states he recently saw [name] [name] who has been following Coumadin and checking lab work. He denies any obvious melena or hematochezia.  ECG today shows atrial fibrillation at 79 with aberrantly conducted complexes, nonspecific T-wave changes.  No Known Allergies  Current Outpatient Prescriptions  Medication Sig Dispense Refill  . carvedilol (COREG) 12.5 MG tablet Take 1 tablet (12.5 mg total) by mouth 2 (two) times daily with a meal.  60 tablet    . Cholecalciferol (VITAMIN D3) 2000 UNITS capsule Take 2,000 Units by mouth daily.        Marland Kitchen diltiazem (CARDIZEM CD) 240 MG 24 hr capsule Take 1 capsule (240 mg total) by mouth daily.      . finasteride (PROSCAR) 5 MG tablet Take 5 mg by mouth daily.       . furosemide (LASIX) 40 MG tablet Take 1 tablet (40 mg total) by mouth daily.  30 tablet    . isosorbide mononitrate (IMDUR) 30 MG 24 hr tablet Take 1 tablet (30 mg total) by mouth daily.      Marland Kitchen lisinopril (PRINIVIL,ZESTRIL) 10 MG tablet  Take 0.5 tablets (5 mg total) by mouth daily.  90 tablet  3  . polyethylene glycol powder (GLYCOLAX/MIRALAX) powder Take 17 g by mouth daily as needed. For constipation - mix with 8 oz liquid and drink      . potassium chloride (MICRO-K) 10 MEQ CR capsule Take 1 capsule (10 mEq total) by mouth daily.  90 capsule  3  . pravastatin (PRAVACHOL) 40 MG tablet Take 40 mg by mouth at bedtime.       Marland Kitchen warfarin (COUMADIN) 3 MG tablet Take 3 mg by mouth See admin instructions. Take with 5 mg tablet on Wednesday and Friday        Past Medical History  Diagnosis Date  . Cardiomyopathy, nonischemic     LVEF 35-40%  . Essential hypertension, benign   . Atrial fibrillation     Coumadin discontinued 11/13 with severe anemia, epistaxis  . Cardiac arrest 1998    ICD implanted at [name]  . Chronic systolic heart failure   . Pneumonia     Severe with respiratory failure 10/13  . Anemia   . AVM (arteriovenous malformation)     Duodenum - nonbleeding and EGD 11/13  . History of colonic polyps     Colonoscopy 11/13    Past Surgical History  Procedure Date  . Cardiac defibrillator placement 1998  . Defibrillator system revision 04/12/12    MDT Protecta XT VR implanted at [name] by Dr [name] [name] with previously implanted system and leads extracted due to RV lead failure  .  Esophagogastroduodenoscopy 09/25/2012    Procedure: ESOPHAGOGASTRODUODENOSCOPY (EGD);  Surgeon: [name] [name], [name] [name];  Location: [name];  Service: [name];  Laterality: N/A;  . Colonoscopy 09/26/2012    Procedure: COLONOSCOPY;  Surgeon: [name] [name], [name] [name];  Location: [name];  Service: [name];  Laterality: N/A;    Social History [name] [name] reports that he quit smoking about 15 years ago. His smoking use included Cigarettes. He has a 4.8 pack-year smoking history. He has never used smokeless tobacco. [name] [name] reports that he does not drink alcohol.  Review of Systems Reports stable NYHA class II dyspnea, no chest pain  or significant palpitations. No obvious bleeding problems as noted above. No fevers or chills. No cough. Otherwise negative.  Physical Examination Filed Vitals:   10/26/12 0847  BP: 115/83  Pulse: 69   Filed Weights   10/26/12 0847  Weight: 236 lb (107.049 kg)   No acute distress. HEENT: Conjunctiva and lids normal, oropharynx clear. Neck: Supple, no elevated JVP or carotid bruits, no thyromegaly. Lungs: Course but clear to auscultation, nonlabored breathing at rest. Cardiac: Irregularly irregular, no S3 or significant systolic murmur, indistinct PMI. Abdomen: Soft, nontender, bowel sounds present. Extremities: No pitting edema, distal pulses 1-2+. Skin: Warm and dry. Musculoskeletal: No kyphosis. Neuropsychiatric: Alert and oriented x3, affect grossly appropriate.   Problem List and Plan   Atrial fibrillation Chronic, problems with RVR during hospitalization back in October with pneumonia and respiratory failure. Medications were modified since his last 6 month visit, although he seems to be tolerating them well. Heart rate is adequately controlled at this time. Coumadin resumed and followed by [name] [name]. Will have him come back in the next 6 weeks, check CBC at that time.  Cardiomyopathy, nonischemic LVEF 35-40%, noted to be "moderately" reduced by most recent echocardiogram.  Chronic systolic heart failure Clinically stable at this time. Weight is down almost 30 pounds over the last 6 months, although likely related to significant intercurrent illness. He reports no pitting edema, no orthopnea. Continue current diuretic dose and followup BMET for next visit.  Essential hypertension, benign Blood pressure well controlled today.  CKD (chronic kidney disease) stage 2, GFR 60-89 ml/min Most recent creatinine 1.4 in November.  IMPLANTATION OF DEFIBRILLATOR, HX OF [name] device, implanted and [name] with lead revision as noted. Now followed by [name] [name]. No reported  discharges.    [name] [name], M.D., F.A.C.C.

## 2012-10-26 NOTE — Assessment & Plan Note (Signed)
LVEF 35-40%, noted to be "moderately" reduced by most recent echocardiogram.

## 2012-10-26 NOTE — Assessment & Plan Note (Signed)
Chronic, problems with RVR during hospitalization back in October with pneumonia and respiratory failure. Medications were modified since his last 6 month visit, although he seems to be tolerating them well. Heart rate is adequately controlled at this time. Coumadin resumed and followed by Dr. Margo Common. Will have him come back in the next 6 weeks, check CBC at that time.

## 2012-10-26 NOTE — Assessment & Plan Note (Signed)
Most recent creatinine 1.4 in November.

## 2012-10-26 NOTE — Assessment & Plan Note (Signed)
[employer] device, implanted and [facility] with lead revision as noted. Now followed by Dr. Johney Frame. No reported discharges.

## 2012-10-26 NOTE — Patient Instructions (Addendum)
Your physician recommends that you schedule a follow-up appointment in: 6 weeks. Your physician recommends that you continue on your current medications as directed. Please refer to the Current Medication list given to you today.  Your physician recommends that you return for lab work in: 6 weeks at [facility] for BMET & CBC.

## 2012-10-26 NOTE — Assessment & Plan Note (Signed)
Blood pressure well-controlled today. 

## 2012-12-08 ENCOUNTER — Ambulatory Visit (INDEPENDENT_AMBULATORY_CARE_PROVIDER_SITE_OTHER): Payer: PRIVATE HEALTH INSURANCE | Admitting: Cardiology

## 2012-12-08 ENCOUNTER — Encounter: Payer: Self-pay | Admitting: Cardiology

## 2012-12-08 VITALS — BP 116/79 | HR 67 | Ht 77.0 in | Wt 248.0 lb

## 2012-12-08 DIAGNOSIS — I10 Essential (primary) hypertension: Secondary | ICD-10-CM

## 2012-12-08 DIAGNOSIS — I4891 Unspecified atrial fibrillation: Secondary | ICD-10-CM

## 2012-12-08 DIAGNOSIS — N182 Chronic kidney disease, stage 2 (mild): Secondary | ICD-10-CM

## 2012-12-08 DIAGNOSIS — I5022 Chronic systolic (congestive) heart failure: Secondary | ICD-10-CM

## 2012-12-08 MED ORDER — FUROSEMIDE 80 MG PO TABS: 80.0000 mg | ORAL_TABLET | Freq: Every day | ORAL | Status: DC

## 2012-12-08 NOTE — Patient Instructions (Addendum)
Your physician recommends that you schedule a follow-up appointment in: 3 weeks. Your physician has recommended you make the following change in your medication: Increase your furosemide to 80 mg daily. You may take (2) of your 40 mg tablets until they are finished. Your new prescription has been sent to your pharmacy. All other medications will remain the same. Your physician recommends that you return for lab work in about 3 weeks just before you next appointment. Please have this done at [facility] for BMET and BNP.

## 2012-12-08 NOTE — Assessment & Plan Note (Signed)
Good blood pressure control today. 

## 2012-12-08 NOTE — Assessment & Plan Note (Signed)
Most recent creatinine improved to 1.0. Followup BMET after increase in Lasix dose.

## 2012-12-08 NOTE — Assessment & Plan Note (Signed)
Overall asymptomatic, back on Coumadin with normal hemoglobin.

## 2012-12-08 NOTE — Progress Notes (Signed)
Clinical Summary Mr. Kise is a medically complex 57 y.o.male presenting for followup. He was seen recently in December 2013. She denies hospitalizations, no progressive shortness of breath or chest pain. States that he has not been following a strict diet with low sodium intake.  Weight is up approximately 12 pounds from last visit. Followup lab work from 1/23 showed BUN 17, creatinine 1.0, potassium 4.2, hemoglobin 14.1, platelets 174.  We reviewed his medications. Reinforced appropriate diet with sodium restriction, also plan to advance Lasix dosing.   No Known Allergies  Current Outpatient Prescriptions  Medication Sig Dispense Refill  . carvedilol (COREG) 12.5 MG tablet Take 1 tablet (12.5 mg total) by mouth 2 (two) times daily with a meal.  60 tablet    . Cholecalciferol (VITAMIN D3) 2000 UNITS capsule Take 2,000 Units by mouth daily.        Marland Kitchen diltiazem (CARDIZEM CD) 240 MG 24 hr capsule Take 1 capsule (240 mg total) by mouth daily.      . finasteride (PROSCAR) 5 MG tablet Take 5 mg by mouth daily.       . isosorbide mononitrate (IMDUR) 30 MG 24 hr tablet Take 1 tablet (30 mg total) by mouth daily.      Marland Kitchen lisinopril (PRINIVIL,ZESTRIL) 10 MG tablet Take 0.5 tablets (5 mg total) by mouth daily.  90 tablet  3  . polyethylene glycol powder (GLYCOLAX/MIRALAX) powder Take 17 g by mouth daily as needed. For constipation - mix with 8 oz liquid and drink      . potassium chloride (MICRO-K) 10 MEQ CR capsule Take 1 capsule (10 mEq total) by mouth daily.  90 capsule  3  . pravastatin (PRAVACHOL) 40 MG tablet Take 40 mg by mouth at bedtime.       Marland Kitchen warfarin (COUMADIN) 3 MG tablet Take 3 mg by mouth See admin instructions. Take with 5 mg tablet on Wednesday and Friday      . furosemide (LASIX) 80 MG tablet Take 1 tablet (80 mg total) by mouth daily.  90 tablet  3    Past Medical History  Diagnosis Date  . Cardiomyopathy, nonischemic     LVEF 35-40%  . Essential hypertension, benign   .  Atrial fibrillation     Coumadin discontinued 11/13 with severe anemia, epistaxis  . Cardiac arrest 1998    ICD implanted at [facility]  . Chronic systolic heart failure   . Pneumonia     Severe with respiratory failure 10/13  . Anemia   . AVM (arteriovenous malformation)     Duodenum - nonbleeding and EGD 11/13  . History of colonic polyps     Colonoscopy 11/13    Past Surgical History  Procedure Date  . Cardiac defibrillator placement 1998  . 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 Esophagogastroduodenoscopy 09/25/2012    Procedure: ESOPHAGOGASTRODUODENOSCOPY (EGD);  Surgeon: Theda Belfast, MD;  Location: [facility] ENDOSCOPY;  Service: Endoscopy;  Laterality: N/A;  . Colonoscopy 09/26/2012    Procedure: COLONOSCOPY;  Surgeon: Theda Belfast, MD;  Location: [facility] ENDOSCOPY;  Service: Endoscopy;  Laterality: N/A;   Social History Mr. Mccubbins reports that he quit smoking about 16 years ago. His smoking use included Cigarettes. He has a 4.8 pack-year smoking history. He has never used smokeless tobacco. Mr. Sainsbury reports that he does not drink alcohol.  Review of  Systems No palpitations or device discharges. No orthopnea. No leg edema. Otherwise negative.  Physical Examination Filed Vitals:   12/08/12 1051  BP: 116/79  Pulse: 67   Filed Weights   12/08/12 1051  Weight: 248 lb (112.492 kg)    No acute distress.  HEENT: Conjunctiva and lids normal, oropharynx clear.  Neck: Supple, no elevated JVP or carotid bruits, no thyromegaly.  Lungs: Course but clear to auscultation, nonlabored breathing at rest.  Cardiac: Irregularly irregular, no S3 or significant systolic murmur, indistinct PMI.  Abdomen: Soft, nontender, bowel sounds present.  Extremities: No pitting edema, distal pulses 1-2+.  Skin: Warm and dry.  Musculoskeletal: No kyphosis.    Neuropsychiatric: Alert and oriented x3, affect grossly appropriate.   Problem List and Plan   Chronic systolic heart failure He seems to be relatively stable, does notice some increased abdominal girth but no peripheral edema. His weight is up 12 pounds. I reinforced appropriate diet and sodium restriction, will increase his Lasix to 80 mg daily. Followup arranged with BMET over the next few weeks.  CKD (chronic kidney disease) stage 2, GFR 60-89 ml/min Most recent creatinine improved to 1.0. Followup BMET after increase in Lasix dose.  Essential hypertension, benign Good blood pressure control today.  Atrial fibrillation Overall asymptomatic, back on Coumadin with normal hemoglobin.    Jonelle Sidle, M.D., F.A.C.C.

## 2012-12-08 NOTE — Assessment & Plan Note (Signed)
He seems to be relatively stable, does notice some increased abdominal girth but no peripheral edema. His weight is up 12 pounds. I reinforced appropriate diet and sodium restriction, will increase his Lasix to 80 mg daily. Followup arranged with BMET over the next few weeks.

## 2013-01-05 ENCOUNTER — Telehealth: Payer: Self-pay | Admitting: *Deleted

## 2013-01-05 ENCOUNTER — Encounter: Payer: Self-pay | Admitting: Physician Assistant

## 2013-01-05 ENCOUNTER — Ambulatory Visit (INDEPENDENT_AMBULATORY_CARE_PROVIDER_SITE_OTHER): Payer: PRIVATE HEALTH INSURANCE | Admitting: Physician Assistant

## 2013-01-05 VITALS — BP 128/74 | HR 72 | Ht 77.0 in | Wt 251.0 lb

## 2013-01-05 DIAGNOSIS — I4891 Unspecified atrial fibrillation: Secondary | ICD-10-CM

## 2013-01-05 DIAGNOSIS — I5022 Chronic systolic (congestive) heart failure: Secondary | ICD-10-CM

## 2013-01-05 DIAGNOSIS — I428 Other cardiomyopathies: Secondary | ICD-10-CM

## 2013-01-05 MED ORDER — CARVEDILOL 12.5 MG PO TABS: 18.7500 mg | ORAL_TABLET | Freq: Two times a day (BID) | ORAL | Status: DC

## 2013-01-05 NOTE — Telephone Encounter (Signed)
Message copied by Lesle Chris on Fri Jan 05, 2013  3:42 PM ------      Message from: MCDOWELL, Illene Bolus      Created: Thu Jan 04, 2013  9:26 AM       Creatinine 1.1 and GFR remains normal after increasing Lasix. Potassium normal. BNP only 143. ------

## 2013-01-05 NOTE — Assessment & Plan Note (Signed)
Adequately rate controlled on current medication regimen. Chronic Coumadin anticoagulation, followed by primary M.D.

## 2013-01-05 NOTE — Assessment & Plan Note (Signed)
Will increase carvedilol to 18.75 twice a day. RN visit in 2 weeks for BP/HR check. Uptitrate to full dose, if BP allows.

## 2013-01-05 NOTE — Progress Notes (Signed)
Primary Cardiologist: Simona Huh, MD   HPI: Patient presents for early scheduled followup, per Dr. Diona Browner, recently seen on January 24. Lasix increased to 80 daily for management of mild volume overload.  Followup labs, February 19: BUN 22, creatinine 1.1, potassium 4.4. BNP 140 (260, 08/2012)  He reports some palpable improvement in his DOE, following recent increase in Lasix. He is able to climb a full flight of stairs with only minimal SOB. He denies any exertional CP. He denies tachycardia palpitations or ICD discharge. He weighs himself daily, and refrains from added salt.  No Known Allergies  Current Outpatient Prescriptions  Medication Sig Dispense Refill  . carvedilol (COREG) 12.5 MG tablet Take 1 tablet (12.5 mg total) by mouth 2 (two) times daily with a meal.  60 tablet    . Cholecalciferol (VITAMIN D3) 2000 UNITS capsule Take 2,000 Units by mouth daily.        Marland Kitchen diltiazem (CARDIZEM CD) 240 MG 24 hr capsule Take 1 capsule (240 mg total) by mouth daily.      . finasteride (PROSCAR) 5 MG tablet Take 5 mg by mouth daily.       . furosemide (LASIX) 80 MG tablet Take 1 tablet (80 mg total) by mouth daily.  90 tablet  3  . isosorbide mononitrate (IMDUR) 30 MG 24 hr tablet Take 1 tablet (30 mg total) by mouth daily.      Marland Kitchen lisinopril (PRINIVIL,ZESTRIL) 10 MG tablet Take 0.5 tablets (5 mg total) by mouth daily.  90 tablet  3  . polyethylene glycol powder (GLYCOLAX/MIRALAX) powder Take 17 g by mouth daily as needed. For constipation - mix with 8 oz liquid and drink      . potassium chloride (MICRO-K) 10 MEQ CR capsule Take 1 capsule (10 mEq total) by mouth daily.  90 capsule  3  . pravastatin (PRAVACHOL) 40 MG tablet Take 40 mg by mouth at bedtime.       Marland Kitchen warfarin (COUMADIN) 3 MG tablet Take 3 mg by mouth See admin instructions. Take with 5 mg tablet on Wednesday and Friday       No current facility-administered medications for this visit.    Past Medical History  Diagnosis Date   . Cardiomyopathy, nonischemic     LVEF 35-40%  . Essential hypertension, benign   . Atrial fibrillation     Coumadin discontinued 11/13 with severe anemia, epistaxis  . Cardiac arrest 1998    ICD implanted at [facility]  . Chronic systolic heart failure   . Pneumonia     Severe with respiratory failure 10/13  . Anemia   . AVM (arteriovenous malformation)     Duodenum - nonbleeding and EGD 11/13  . History of colonic polyps     Colonoscopy 11/13    Past Surgical History  Procedure Laterality Date  . Cardiac defibrillator placement  1998  . Defibrillator system revision  04/12/12    MDT Protecta XT VR implanted at [facility] by Dr Isabell Jarvis with previously implanted system and leads extracted due to RV lead failure  . Esophagogastroduodenoscopy  09/25/2012    Procedure: ESOPHAGOGASTRODUODENOSCOPY (EGD);  Surgeon: Theda Belfast, MD;  Location[facility] ENDOSCOPY;  Service: Endoscopy;  Laterality: N/A;  . Colonoscopy  09/26/2012    Procedure: COLONOSCOPY;  Surgeon: Theda Belfast, MD;  Location: [facility] ENDOSCOPY;  Service: Endoscopy;  Laterality: N/A;    History   Social History  . Marital Status: Single    Spouse Name: N/A    Number of  Children: N/A  . Years of Education: N/A   Occupational History  . [employer]    Social History Main Topics  . Smoking status: Former Smoker -- 0.80 packs/day for 6 years    Types: Cigarettes    Quit date: 11/15/1996  . Smokeless tobacco: Never Used  . Alcohol Use: No  . Drug Use: Yes    Special: Marijuana     Comment: Many years ago  . Sexually Active: No   Other Topics Concern  . Not on file   Social History Narrative   Lives in [place] with his father and 2 brothers.    Family History  Problem Relation Age of Onset  . Diabetes Mother     Died @ 106.  . Diabetes Brother     Brother also has htn  . Hypertension Father     Alive @ 15.  Marland Kitchen Hypertension Brother   . Alzheimer's disease Father     ROS: no nausea, vomiting; no fever, chills; no  melena, hematochezia; no claudication  PHYSICAL EXAM: BP 128/74  Pulse 72  Ht 6\' 5"  (1.956 m)  Wt 251 lb (113.853 kg)  BMI 29.76 kg/m2  SpO2 97% GENERAL:56 year-old male; NAD HEENT: NCAT, PERRLA, EOMI; sclera clear; no xanthelasma; edentulous NECK: palpable bilateral carotid pulses, no bruits; no JVD; no TM LUNGS: CTA bilaterally CARDIAC: Irregularly irregular (S1, S2); no significant murmurs; no rubs or gallops ABDOMEN: soft, non-tender; intact BS EXTREMETIES: no significant peripheral edema SKIN: warm/dry; no obvious rash/lesions MUSCULOSKELETAL: no joint deformity NEURO: no focal deficit; NL affect    EKG:    ASSESSMENT & PLAN:  Chronic systolic heart failure Despite a 3 pound gain since last OV, patient suggests improvement in DOE on increased dose of Lasix. Therefore, continue current diuretic regimen. Recent followup labs were stable.  Cardiomyopathy, nonischemic Will increase carvedilol to 18.75 twice a day. RN visit in 2 weeks for BP/HR check. Uptitrate to full dose, if BP allows.  Atrial fibrillation Adequately rate controlled on current medication regimen. Chronic Coumadin anticoagulation, followed by primary M.D.    Gene Avinash Maltos, PAC

## 2013-01-05 NOTE — Telephone Encounter (Signed)
Notes Recorded by Lesle Chris, LPN on 04/18/5408 at 3:42 PM Patient notified and verbalized understanding.

## 2013-01-05 NOTE — Assessment & Plan Note (Signed)
Despite a 3 pound gain since last OV, patient suggests improvement in DOE on increased dose of Lasix. Therefore, continue current diuretic regimen. Recent followup labs were stable.

## 2013-01-05 NOTE — Patient Instructions (Addendum)
   Increase Coreg to 18.75 twice a day   Continue all other current medications. Check blood pressure and heart rate in 2 weeks (around March 6) and call reading to office Follow up in  3 months

## 2013-04-05 ENCOUNTER — Ambulatory Visit: Payer: PRIVATE HEALTH INSURANCE | Admitting: Cardiology

## 2013-04-05 ENCOUNTER — Encounter: Payer: PRIVATE HEALTH INSURANCE | Admitting: Internal Medicine

## 2013-04-06 ENCOUNTER — Encounter: Payer: Self-pay | Admitting: Cardiology

## 2013-04-06 ENCOUNTER — Ambulatory Visit (INDEPENDENT_AMBULATORY_CARE_PROVIDER_SITE_OTHER): Payer: PRIVATE HEALTH INSURANCE | Admitting: Cardiology

## 2013-04-06 VITALS — BP 110/62 | HR 83 | Ht 77.0 in | Wt 269.8 lb

## 2013-04-06 DIAGNOSIS — I10 Essential (primary) hypertension: Secondary | ICD-10-CM

## 2013-04-06 DIAGNOSIS — I4891 Unspecified atrial fibrillation: Secondary | ICD-10-CM

## 2013-04-06 DIAGNOSIS — I428 Other cardiomyopathies: Secondary | ICD-10-CM

## 2013-04-06 DIAGNOSIS — I5022 Chronic systolic (congestive) heart failure: Secondary | ICD-10-CM

## 2013-04-06 DIAGNOSIS — Z95810 Presence of automatic (implantable) cardiac defibrillator: Secondary | ICD-10-CM

## 2013-04-06 NOTE — Patient Instructions (Signed)
Continue all current medications. Follow up in  4 months  

## 2013-04-06 NOTE — Assessment & Plan Note (Signed)
Blood pressure is normal today. 

## 2013-04-06 NOTE — Assessment & Plan Note (Signed)
It seems that his weight gain may be related to caloric intake, lungs are clear he has no peripheral edema. Plan to keep Lasix dose stable at this time. We discussed diet and a walking regimen.

## 2013-04-06 NOTE — Progress Notes (Signed)
Clinical Summary [name] [name] is a medically complex 57 y.o.male last seen in the office by Mr. Serpe PA-C in February. He states that he feels fairly well, NYHA class II dyspnea, no orthopnea or leg edema. This is despite a continued increase in his weight. Exploring this finds that his appetite is fairly robust, and he admits that he overeats regularly. We discussed appropriate portion size, also a walking regimen.  Lab work in February revealed BUN 22, creatinine 1.1, potassium 4.4. He reports compliance with his medications, stable dose of Lasix since increased several months ago.  No Known Allergies  Current Outpatient Prescriptions  Medication Sig Dispense Refill  . carvedilol (COREG) 12.5 MG tablet Take 1.5 tablets (18.75 mg total) by mouth 2 (two) times daily with a meal.  90 tablet  6  . Cholecalciferol (VITAMIN D3) 2000 UNITS capsule Take 2,000 Units by mouth daily.        Marland Kitchen diltiazem (CARDIZEM CD) 240 MG 24 hr capsule Take 1 capsule (240 mg total) by mouth daily.      . finasteride (PROSCAR) 5 MG tablet Take 5 mg by mouth daily.       . furosemide (LASIX) 80 MG tablet Take 1 tablet (80 mg total) by mouth daily.  90 tablet  3  . isosorbide mononitrate (IMDUR) 30 MG 24 hr tablet Take 1 tablet (30 mg total) by mouth daily.      Marland Kitchen lisinopril (PRINIVIL,ZESTRIL) 10 MG tablet Take 0.5 tablets (5 mg total) by mouth daily.  90 tablet  3  . polyethylene glycol powder (GLYCOLAX/MIRALAX) powder Take 17 g by mouth daily as needed. For constipation - mix with 8 oz liquid and drink      . potassium chloride (MICRO-K) 10 MEQ CR capsule Take 1 capsule (10 mEq total) by mouth daily.  90 capsule  3  . pravastatin (PRAVACHOL) 40 MG tablet Take 40 mg by mouth at bedtime.       Marland Kitchen warfarin (COUMADIN) 3 MG tablet Take 3 mg by mouth See admin instructions. Take with 5 mg tablet on Wednesday and Friday       No current facility-administered medications for this visit.    Past Medical History  Diagnosis  Date  . Cardiomyopathy, nonischemic     LVEF 35-40%  . Essential hypertension, benign   . Atrial fibrillation     Coumadin discontinued 11/13 with severe anemia, epistaxis  . Cardiac arrest 1998    ICD implanted at [facility]  . Chronic systolic heart failure   . Pneumonia     Severe with respiratory failure 10/13  . Anemia   . AVM (arteriovenous malformation)     Duodenum - nonbleeding and EGD 11/13  . History of colonic polyps     Colonoscopy 11/13    Social History Mr. Fishman reports that he quit smoking about 16 years ago. His smoking use included Cigarettes. He has a 4.8 pack-year smoking history. He has never used smokeless tobacco. Mr. Stuard reports that he does not drink alcohol.  Review of Systems No palpitations or device discharges. Otherwise negative.  Physical Examination Filed Vitals:   04/06/13 1109  BP: 110/62  Pulse: 83   Filed Weights   04/06/13 1109  Weight: 269 lb 12 oz (122.358 kg)    Comfortable. HEENT: Conjunctiva and lids normal, oropharynx clear.  Neck: Supple, no elevated JVP or carotid bruits, no thyromegaly.  Lungs: Course but clear to auscultation, nonlabored breathing at rest.  Cardiac: Irregularly irregular, no S3  or significant systolic murmur, indistinct PMI.  Abdomen: Soft, nontender, bowel sounds present.  Extremities: No pitting edema, distal pulses 1-2+.  Skin: Warm and dry.  Musculoskeletal: No kyphosis.  Neuropsychiatric: Alert and oriented x3, affect grossly appropriate.   Problem List and Plan   Atrial fibrillation Permanent, continue strategy of heart rate control and anticoagulation.  Cardiomyopathy, nonischemic Moderately reduced LVEF status post ICD.  Chronic systolic heart failure It seems that his weight gain may be related to caloric intake, lungs are clear he has no peripheral edema. Plan to keep Lasix dose stable at this time. We discussed diet and a walking regimen.  Essential hypertension, benign Blood  pressure is normal today.  IMPLANTATION OF DEFIBRILLATOR, HX OF Keep followup with Dr. Johney Frame.    Jonelle Sidle, M.D., F.A.C.C.

## 2013-04-06 NOTE — Assessment & Plan Note (Signed)
Keep followup with Dr. Allred. 

## 2013-04-06 NOTE — Assessment & Plan Note (Signed)
Moderately reduced LVEF status post ICD.

## 2013-04-06 NOTE — Assessment & Plan Note (Signed)
Permanent, continue strategy of heart rate control and anticoagulation. 

## 2013-05-25 ENCOUNTER — Encounter: Payer: Self-pay | Admitting: Internal Medicine

## 2013-05-25 ENCOUNTER — Ambulatory Visit (INDEPENDENT_AMBULATORY_CARE_PROVIDER_SITE_OTHER): Payer: PRIVATE HEALTH INSURANCE | Admitting: Internal Medicine

## 2013-05-25 VITALS — BP 116/75 | HR 91 | Ht 77.0 in | Wt 264.8 lb

## 2013-05-25 DIAGNOSIS — I428 Other cardiomyopathies: Secondary | ICD-10-CM

## 2013-05-25 DIAGNOSIS — I5022 Chronic systolic (congestive) heart failure: Secondary | ICD-10-CM

## 2013-05-25 DIAGNOSIS — I4891 Unspecified atrial fibrillation: Secondary | ICD-10-CM

## 2013-05-25 LAB — ICD DEVICE OBSERVATION
BATTERY VOLTAGE: 3.2003 V
BRDY-0002RV: 40 {beats}/min
FVT: 0
HV IMPEDENCE: 54 Ohm
PACEART VT: 0
TOT-0002: 0
TZAT-0001SLOWVT: 1
TZAT-0002FASTVT: NEGATIVE
TZAT-0002SLOWVT: NEGATIVE
TZAT-0012SLOWVT: 200 ms
TZAT-0018FASTVT: NEGATIVE
TZAT-0018SLOWVT: NEGATIVE
TZAT-0019FASTVT: 8 V
TZAT-0019SLOWVT: 8 V
TZAT-0020SLOWVT: 1.5 ms
TZON-0005SLOWVT: 12
TZST-0001FASTVT: 4
TZST-0001FASTVT: 5
TZST-0001FASTVT: 6
TZST-0001SLOWVT: 2
TZST-0001SLOWVT: 3
TZST-0001SLOWVT: 5
TZST-0002FASTVT: NEGATIVE
TZST-0002FASTVT: NEGATIVE
TZST-0002SLOWVT: NEGATIVE
TZST-0002SLOWVT: NEGATIVE
VF: 0

## 2013-05-25 NOTE — Progress Notes (Signed)
PCP: [name], MD Primary Cardiologist:  Dr Anselm Pancoast is a 57 y.o. male who presents today for routine electrophysiology followup.  Since last being seen in our clinic, the patient reports doing very well.  Today, he denies symptoms of palpitations, chest pain, shortness of breath,  lower extremity edema, dizziness, presyncope, syncope, or ICD shocks.  The patient is otherwise without complaint today.   Past Medical History  Diagnosis Date  . Cardiomyopathy, nonischemic     LVEF 35-40%  . Essential hypertension, benign   . Atrial fibrillation     Coumadin discontinued 11/13 with severe anemia, epistaxis  . Cardiac arrest 1998    ICD implanted at [facility]  . Chronic systolic heart failure   . Pneumonia     Severe with respiratory failure 10/13  . Anemia   . AVM (arteriovenous malformation)     Duodenum - nonbleeding and EGD 11/13  . History of colonic polyps     Colonoscopy 11/13   Past Surgical History  Procedure Laterality Date  . Cardiac defibrillator placement  1998  . 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 Esophagogastroduodenoscopy  09/25/2012    Procedure: ESOPHAGOGASTRODUODENOSCOPY (EGD);  Surgeon: Theda Belfast, MD;  Location: [facility] ENDOSCOPY;  Service: Endoscopy;  Laterality: N/A;  . Colonoscopy  09/26/2012    Procedure: COLONOSCOPY;  Surgeon: Theda Belfast, MD;  Location: [facility] ENDOSCOPY;  Service: Endoscopy;  Laterality: N/A;    Current Outpatient Prescriptions  Medication Sig Dispense Refill  . carvedilol (COREG) 12.5 MG tablet Take 1.5 tablets (18.75 mg total) by mouth 2 (two) times daily with a meal.  90 tablet  6  . Cholecalciferol (VITAMIN D3) 2000 UNITS capsule Take 2,000 Units by mouth daily.        Marland Kitchen diltiazem (CARDIZEM CD) 240 MG 24 hr capsule Take 1 capsule (240 mg total) by mouth daily.      . finasteride  (PROSCAR) 5 MG tablet Take 5 mg by mouth daily.       . furosemide (LASIX) 80 MG tablet Take 1 tablet (80 mg total) by mouth daily.  90 tablet  3  . isosorbide mononitrate (IMDUR) 30 MG 24 hr tablet Take 1 tablet (30 mg total) by mouth daily.      Marland Kitchen lisinopril (PRINIVIL,ZESTRIL) 10 MG tablet Take 0.5 tablets (5 mg total) by mouth daily.  90 tablet  3  . polyethylene glycol powder (GLYCOLAX/MIRALAX) powder Take 17 g by mouth daily as needed. For constipation - mix with 8 oz liquid and drink      . potassium chloride (MICRO-K) 10 MEQ CR capsule Take 1 capsule (10 mEq total) by mouth daily.  90 capsule  3  . pravastatin (PRAVACHOL) 40 MG tablet Take 40 mg by mouth at bedtime.       Marland Kitchen warfarin (COUMADIN) 3 MG tablet Take 3 mg by mouth See admin instructions. Take with 5 mg tablet on Wednesday and Friday       No current facility-administered medications for this visit.    Physical Exam: Filed Vitals:   05/25/13 1037  BP: 116/75  Pulse: 91  Height: 6\' 5"  (1.956 m)  Weight: 264 lb 12.8 oz (120.112 kg)    GEN- The patient is well appearing, alert and oriented x 3 today.   Head- normocephalic, atraumatic Eyes-  Sclera clear, conjunctiva  pink Ears- hearing intact Oropharynx- clear Lungs- Clear to ausculation bilaterally, normal work of breathing Chest- ICD pocket is well healed Heart- irregular rate and rhythm, no murmurs, rubs or gallops, PMI not laterally displaced GI- soft, NT, ND, + BS Extremities- no clubbing, cyanosis, or edema  ICD interrogation- reviewed in detail today,  See PACEART report  Assessment and Plan:  1.  Chronic systolic dysfunction euvolemic today Stable on an appropriate medical regimen Normal ICD function See Pace Art report SIC counters are noted, though there are no episodes.  Impedance/ pacing/ sensing parameter are stable. Will give the patient a magnet with instructions on use and follow closely No changes today  2. Permanent afib Continue  anticoagulation long term  Return to see Belenda Cruise in 3 months I will see in a year

## 2013-05-25 NOTE — Patient Instructions (Addendum)
   Kristin - 3 months  Allred - 1 year Continue all current medications.

## 2013-06-01 ENCOUNTER — Encounter: Payer: Self-pay | Admitting: Internal Medicine

## 2013-08-01 ENCOUNTER — Encounter: Payer: Self-pay | Admitting: Cardiology

## 2013-08-08 ENCOUNTER — Ambulatory Visit (INDEPENDENT_AMBULATORY_CARE_PROVIDER_SITE_OTHER): Payer: 59 | Admitting: Cardiology

## 2013-08-08 ENCOUNTER — Encounter: Payer: Self-pay | Admitting: Cardiology

## 2013-08-08 VITALS — BP 121/78 | HR 84 | Ht 77.0 in | Wt 264.0 lb

## 2013-08-08 DIAGNOSIS — I10 Essential (primary) hypertension: Secondary | ICD-10-CM

## 2013-08-08 DIAGNOSIS — Z95810 Presence of automatic (implantable) cardiac defibrillator: Secondary | ICD-10-CM

## 2013-08-08 DIAGNOSIS — I4891 Unspecified atrial fibrillation: Secondary | ICD-10-CM

## 2013-08-08 DIAGNOSIS — I428 Other cardiomyopathies: Secondary | ICD-10-CM

## 2013-08-08 NOTE — Assessment & Plan Note (Signed)
Keep followup with Dr. Johney Frame. Patient denies any device discharges.

## 2013-08-08 NOTE — Assessment & Plan Note (Signed)
Blood pressure control is good today. 

## 2013-08-08 NOTE — Patient Instructions (Addendum)

## 2013-08-08 NOTE — Progress Notes (Signed)
Clinical Summary [name] is a 57 y.o.male last seen in May. Interval followup with [name] [name] noted. He has been doing relatively well, stable dyspnea on exertion, no chest pain or palpitations. He denies any bleeding problems on Coumadin.  Recent lab work in September showed BUN 12, creatinine 0.9, INR 2.1.  We talked about his weight and overall exercise. He has not been doing anything regular. We discussed a reasonable walking regimen, also diet.  No Known Allergies  Current Outpatient Prescriptions  Medication Sig Dispense Refill  . carvedilol (COREG) 12.5 MG tablet Take 1.5 tablets (18.75 mg total) by mouth 2 (two) times daily with a meal.  90 tablet  6  . Cholecalciferol (VITAMIN D3) 2000 UNITS capsule Take 2,000 Units by mouth daily.        . ciprofloxacin (CIPRO) 500 MG tablet Take 500 mg by mouth every 12 (twelve) hours.      Marland Kitchen diltiazem (CARDIZEM CD) 240 MG 24 hr capsule Take 1 capsule (240 mg total) by mouth daily.      . finasteride (PROSCAR) 5 MG tablet Take 5 mg by mouth daily.       . furosemide (LASIX) 80 MG tablet Take 1 tablet (80 mg total) by mouth daily.  90 tablet  3  . isosorbide mononitrate (IMDUR) 30 MG 24 hr tablet Take 1 tablet (30 mg total) by mouth daily.      Marland Kitchen lisinopril (PRINIVIL,ZESTRIL) 10 MG tablet Take 0.5 tablets (5 mg total) by mouth daily.  90 tablet  3  . polyethylene glycol powder (GLYCOLAX/MIRALAX) powder Take 17 g by mouth daily as needed. For constipation - mix with 8 oz liquid and drink      . potassium chloride (MICRO-K) 10 MEQ CR capsule Take 1 capsule (10 mEq total) by mouth daily.  90 capsule  3  . pravastatin (PRAVACHOL) 40 MG tablet Take 40 mg by mouth at bedtime.       . tamsulosin (FLOMAX) 0.4 MG CAPS capsule Take 0.4 mg by mouth daily.      Marland Kitchen warfarin (COUMADIN) 3 MG tablet Take 3 mg by mouth See admin instructions. Take with 5 mg tablet on Wednesday and Friday       No current facility-administered medications for this visit.     Past Medical History  Diagnosis Date  . Cardiomyopathy, nonischemic     LVEF 35-40%  . Essential hypertension, benign   . Atrial fibrillation     Coumadin discontinued 11/13 with severe anemia, epistaxis  . Cardiac arrest 1998    ICD implanted at [facility]  . Chronic systolic heart failure   . Pneumonia     Severe with respiratory failure 10/13  . Anemia   . AVM (arteriovenous malformation)     Duodenum - nonbleeding and EGD 11/13  . History of colonic polyps     Colonoscopy 11/13    Past Surgical History  Procedure Laterality Date  . Cardiac defibrillator placement  1998  . 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 Esophagogastroduodenoscopy  09/25/2012    Procedure: ESOPHAGOGASTRODUODENOSCOPY (EGD);  Surgeon: Theda Belfast, MD;  Location: [facility] ENDOSCOPY;  Service: Endoscopy;  Laterality: N/A;  . Colonoscopy  09/26/2012    Procedure: COLONOSCOPY;  Surgeon: Theda Belfast, MD;  Location: [facility] ENDOSCOPY;  Service: Endoscopy;  Laterality: N/A;    Social History  [name] [name] reports that he quit smoking about 16 years ago. His smoking use included Cigarettes. He has a 4.8 pack-year smoking history. He has never used smokeless tobacco. [name] [name] reports that he does not drink alcohol.  Review of Systems No palpitations or device discharges. No syncope. Otherwise negative.  Physical Examination Filed Vitals:   08/08/13 1038  BP: 121/78  Pulse: 84   Filed Weights   08/08/13 1038  Weight: 264 lb (119.75 kg)    Comfortable. Tall stature. Overweight. HEENT: Conjunctiva and lids normal, oropharynx clear.  Neck: Supple, no elevated JVP or carotid bruits, no thyromegaly.  Lungs: Course but clear to auscultation, nonlabored breathing at rest.  Cardiac: Irregularly irregular, no S3 or significant systolic murmur, indistinct PMI.  Abdomen: Soft,  nontender, bowel sounds present.  Extremities: No pitting edema, distal pulses 1-2+.  Skin: Warm and dry.  Musculoskeletal: No kyphosis.  Neuropsychiatric: Alert and oriented x3, affect grossly appropriate.   Problem List and Plan   Atrial fibrillation Heart rate looks to be well controlled on current regimen. Continue Coumadin for now. Recent INR therapeutic.  Cardiomyopathy, nonischemic Clinically stable. No leg edema of lower refill on examination. We talked about increasing his exercise regimen.  IMPLANTATION OF DEFIBRILLATOR, HX OF Keep followup with [name] [name]. Patient denies any device discharges.  Essential hypertension, benign Blood pressure control is good today.    Jonelle Sidle, M.D., F.A.C.C.

## 2013-08-08 NOTE — Assessment & Plan Note (Signed)
Heart rate looks to be well controlled on current regimen. Continue Coumadin for now. Recent INR therapeutic.

## 2013-08-08 NOTE — Assessment & Plan Note (Signed)
Clinically stable. No leg edema of lower refill on examination. We talked about increasing his exercise regimen.

## 2013-08-10 ENCOUNTER — Ambulatory Visit: Payer: PRIVATE HEALTH INSURANCE | Admitting: Cardiology

## 2013-08-24 ENCOUNTER — Ambulatory Visit (INDEPENDENT_AMBULATORY_CARE_PROVIDER_SITE_OTHER): Payer: 59 | Admitting: *Deleted

## 2013-08-24 DIAGNOSIS — I5022 Chronic systolic (congestive) heart failure: Secondary | ICD-10-CM

## 2013-08-24 DIAGNOSIS — I428 Other cardiomyopathies: Secondary | ICD-10-CM

## 2013-08-24 LAB — ICD DEVICE OBSERVATION
CHARGE TIME: 8.688 s
DEV-0020ICD: NEGATIVE
RV LEAD AMPLITUDE: 4.5 mv
RV LEAD IMPEDENCE ICD: 418 Ohm
RV LEAD THRESHOLD: 1 V
TOT-0001: 0
TOT-0002: 0
TZAT-0001FASTVT: 1
TZAT-0012SLOWVT: 200 ms
TZAT-0019SLOWVT: 8 V
TZAT-0020SLOWVT: 1.5 ms
TZON-0003SLOWVT: 320 ms
TZON-0003VSLOWVT: 360 ms
TZON-0004SLOWVT: 28
TZST-0001FASTVT: 2
TZST-0001FASTVT: 4
TZST-0001FASTVT: 6
TZST-0001SLOWVT: 2
TZST-0001SLOWVT: 6
TZST-0002FASTVT: NEGATIVE
TZST-0002FASTVT: NEGATIVE
TZST-0002SLOWVT: NEGATIVE
TZST-0002SLOWVT: NEGATIVE
TZST-0002SLOWVT: NEGATIVE

## 2013-08-24 NOTE — Progress Notes (Signed)
ICD check with ICM in office. 

## 2013-09-10 ENCOUNTER — Encounter: Payer: Self-pay | Admitting: Internal Medicine

## 2013-11-21 ENCOUNTER — Encounter: Payer: Self-pay | Admitting: Cardiology

## 2013-12-07 ENCOUNTER — Other Ambulatory Visit: Payer: Self-pay | Admitting: Cardiology

## 2013-12-07 MED ORDER — FUROSEMIDE 80 MG PO TABS: 80.0000 mg | ORAL_TABLET | Freq: Every day | ORAL | Status: DC

## 2014-01-21 IMAGING — CR DG CHEST 2V
2 series · 2 of 2 positions shown · non-contrast
Comparison: 05/22/2010

CLINICAL DATA: Shortness of breath

CHEST - 2 VIEW

[w chest pa]
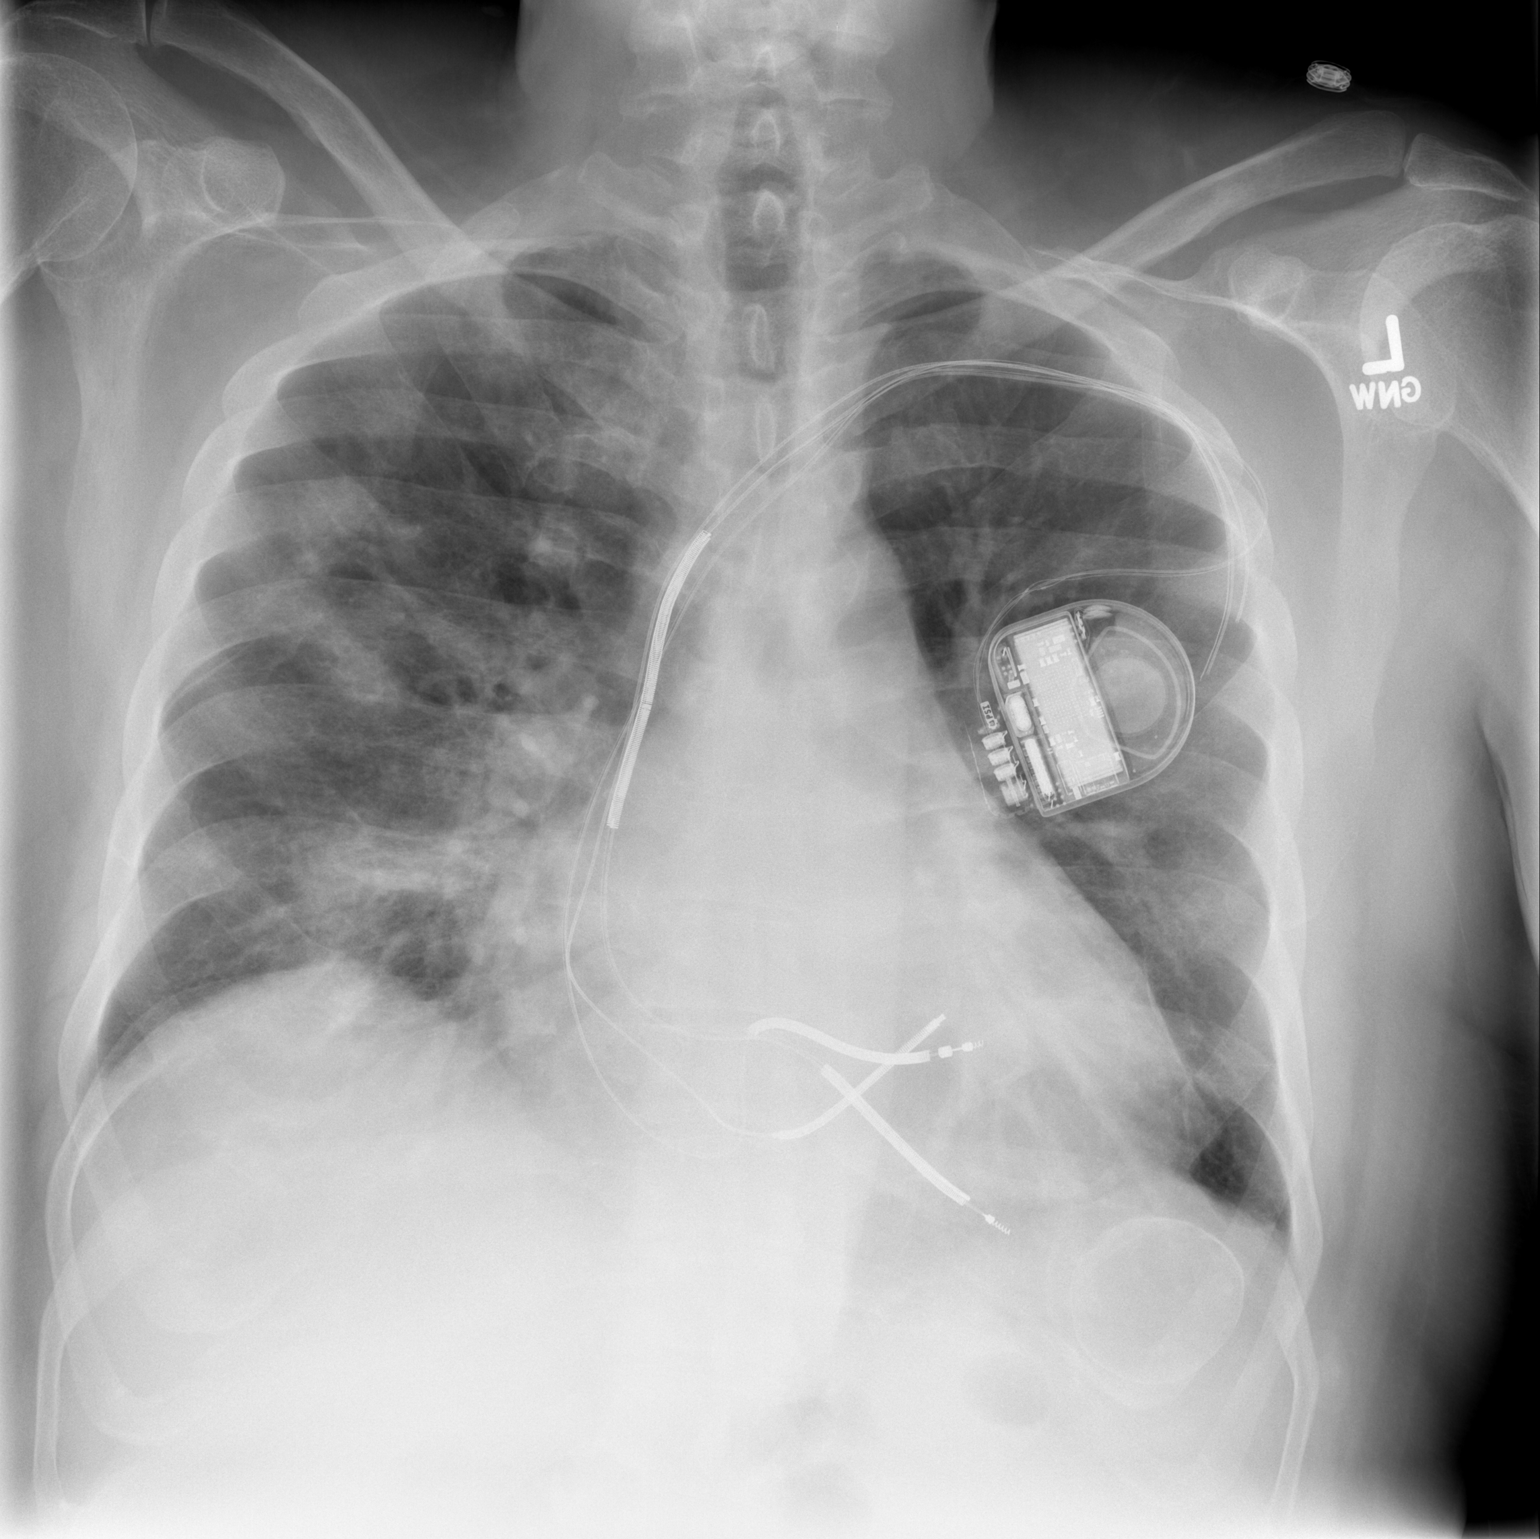

[w chest lat]
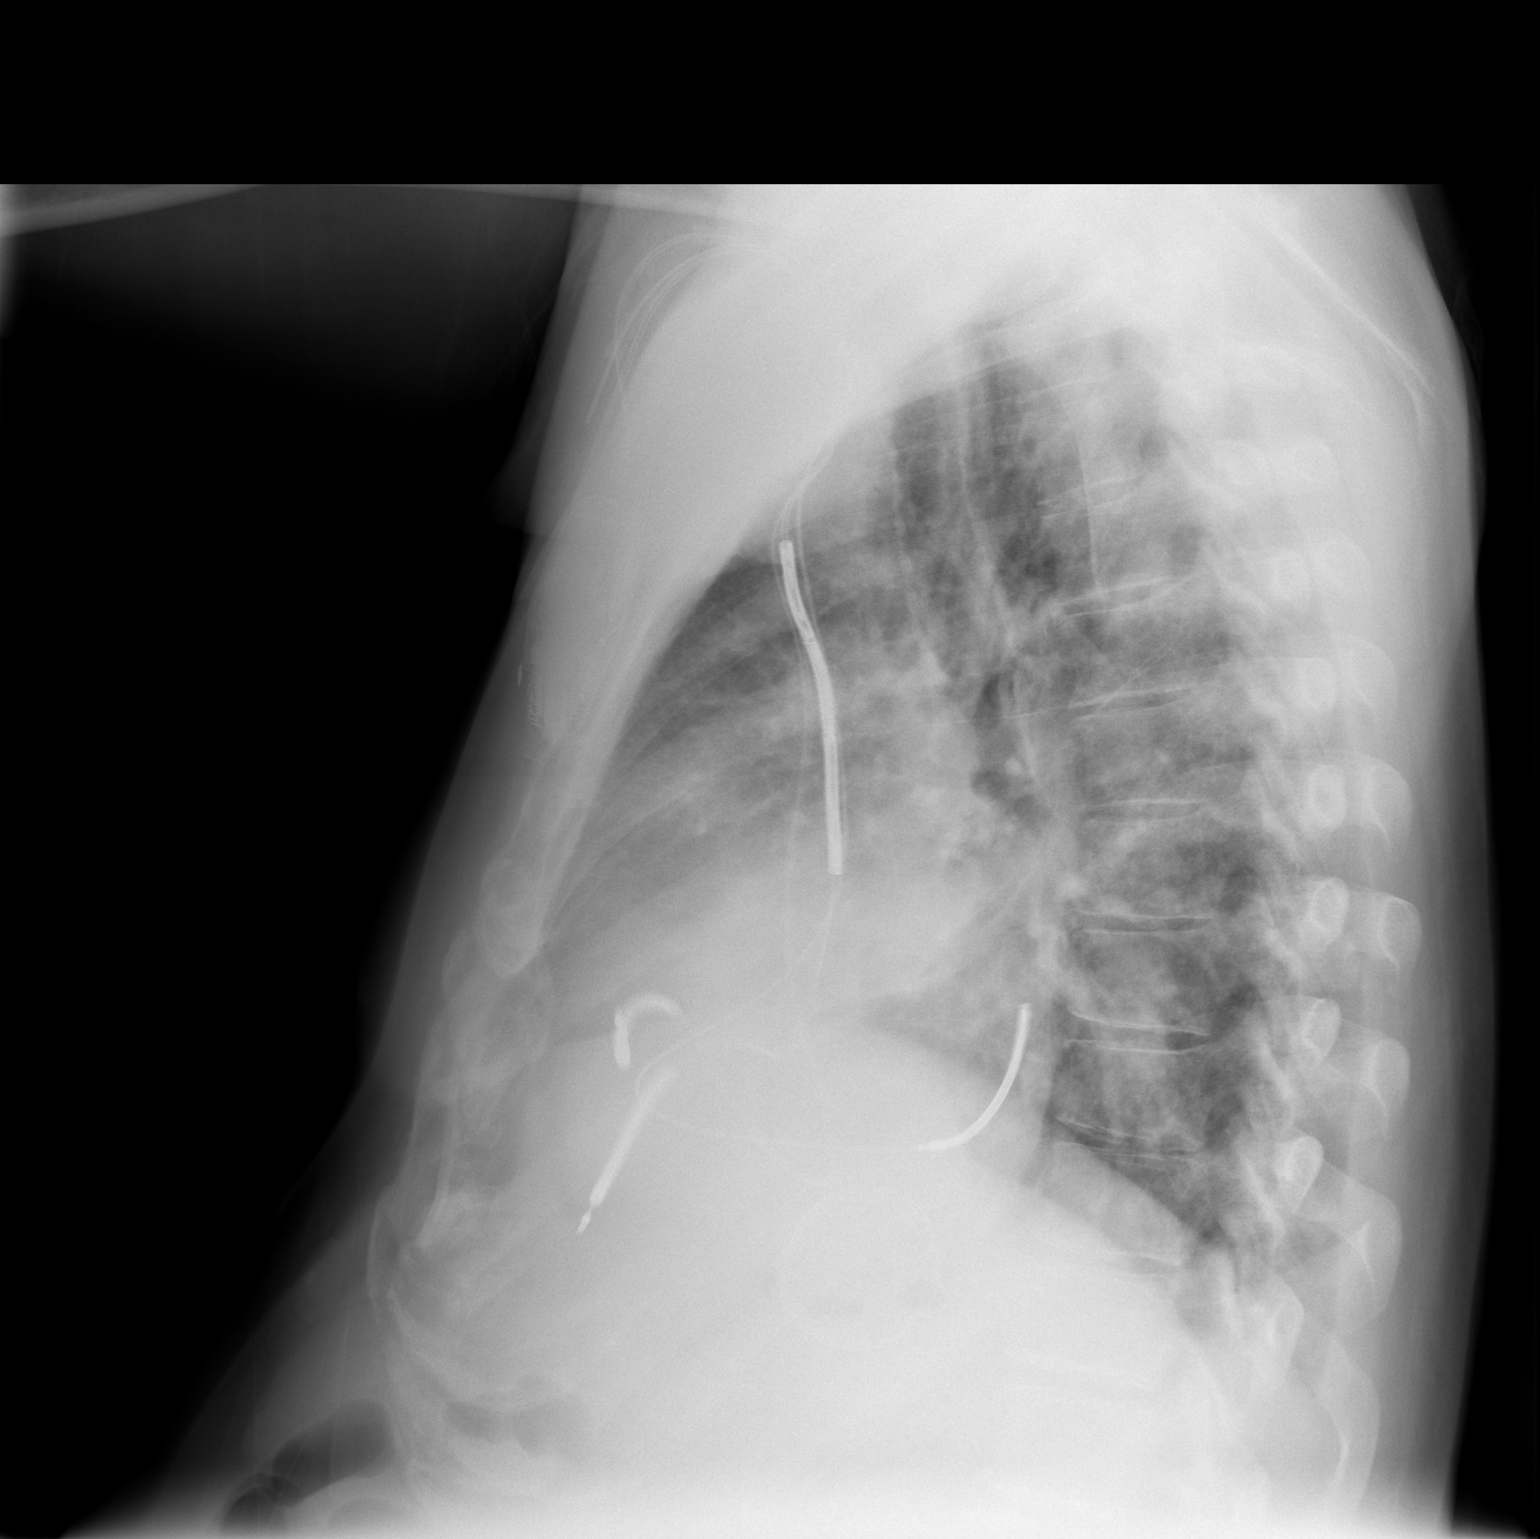

[2 of 2 positions shown; findings below may reference images not displayed]

FINDINGS: Cardiomegaly.  Central vascular congestion. Changed left
chest wall battery pack with additional lead tip since the prior.
Bilateral patchy airspace opacities, right greater than left.  No
pleural effusion.  No pneumothorax.  No acute osseous finding.
There is a rounded calcific density projecting over the left upper
quadrant, perhaps a splenic cyst, similar to prior.]
IMPRESSION: Cardiomegaly with central vascular congestion.

Patchy airspace opacities are nonspecific.  Favored to reflect
multifocal pneumonia versus pulmonary edema. Recommend radiograph
follow-up after appropriate management to document resolution.

## 2014-01-22 ENCOUNTER — Encounter: Payer: Self-pay | Admitting: Cardiology

## 2014-01-22 ENCOUNTER — Ambulatory Visit (INDEPENDENT_AMBULATORY_CARE_PROVIDER_SITE_OTHER): Payer: 59 | Admitting: Cardiology

## 2014-01-22 VITALS — BP 121/75 | HR 81 | Ht 77.0 in | Wt 284.0 lb

## 2014-01-22 DIAGNOSIS — N182 Chronic kidney disease, stage 2 (mild): Secondary | ICD-10-CM

## 2014-01-22 DIAGNOSIS — I4891 Unspecified atrial fibrillation: Secondary | ICD-10-CM

## 2014-01-22 DIAGNOSIS — I428 Other cardiomyopathies: Secondary | ICD-10-CM

## 2014-01-22 DIAGNOSIS — I10 Essential (primary) hypertension: Secondary | ICD-10-CM

## 2014-01-22 DIAGNOSIS — Z95810 Presence of automatic (implantable) cardiac defibrillator: Secondary | ICD-10-CM

## 2014-01-22 MED ORDER — FUROSEMIDE 80 MG PO TABS: 80.0000 mg | ORAL_TABLET | Freq: Two times a day (BID) | ORAL | Status: DC

## 2014-01-22 MED ORDER — FUROSEMIDE 80 MG PO TABS: 80.0000 mg | ORAL_TABLET | Freq: Every day | ORAL | Status: DC

## 2014-01-22 NOTE — Patient Instructions (Addendum)
Your physician recommends that you schedule a follow-up appointment in: 4 months. You will receive a reminder letter in the mail in about 1-2 months reminding you to call and schedule your appointment. If you don't receive this letter, please contact our office. Your physician has recommended you make the following change in your medication:  Increase furosemide 80 mg to twice daily starting today through Friday, then resume daily dose. All other medications will remain the same.

## 2014-01-22 NOTE — Assessment & Plan Note (Signed)
Continue current regimen except increase Lasix to 80 mg twice daily through Friday of this week. We discussed diet and fluid restriction, also obtaining daily weights. Prior baseline been in the 260s.

## 2014-01-22 NOTE — Progress Notes (Signed)
Clinical Summary [name] is a 58 y.o.male last seen in September 2014. ICD interrogated in October 2014, normal function at that time. He denies any palpitations or device shocks. States that his weight has increased about 10 pounds by his scales at home. Today's weight was up 20 pounds from September, although he checked it wearing a heavy coat and heavy boots, also fully clothed in winter wear. He does state that he has been eating more, not exercising at all during the winter. No orthopnea, no reported edema. No dizziness or syncope.  ECG today shows rate-controlled atrial fibrillation.  We reviewed his medications. He reports compliance. Coumadin is followed by Dr. Scotty [name].  No Known Allergies  Current Outpatient Prescriptions  Medication Sig Dispense Refill  . carvedilol (COREG) 12.5 MG tablet Take 12.5 mg by mouth 2 (two) times daily with a meal.      . Cholecalciferol (VITAMIN D3) 2000 UNITS capsule Take 2,000 Units by mouth daily.        Marland Kitchen diltiazem (CARDIZEM CD) 240 MG 24 hr capsule Take 1 capsule (240 mg total) by mouth daily.      . finasteride (PROSCAR) 5 MG tablet Take 5 mg by mouth daily.       . furosemide (LASIX) 80 MG tablet Take 1 tablet (80 mg total) by mouth 2 (two) times daily.  4 tablet  3  . isosorbide mononitrate (IMDUR) 30 MG 24 hr tablet Take 1 tablet (30 mg total) by mouth daily.      Marland Kitchen lisinopril (PRINIVIL,ZESTRIL) 10 MG tablet Take 0.5 tablets (5 mg total) by mouth daily.  90 tablet  3  . polyethylene glycol powder (GLYCOLAX/MIRALAX) powder Take 17 g by mouth daily as needed. For constipation - mix with 8 oz liquid and drink      . potassium chloride (MICRO-K) 10 MEQ CR capsule Take 1 capsule (10 mEq total) by mouth daily.  90 capsule  3  . pravastatin (PRAVACHOL) 40 MG tablet Take 40 mg by mouth at bedtime.       . tamsulosin (FLOMAX) 0.4 MG CAPS capsule Take 0.4 mg by mouth daily.      Marland Kitchen warfarin (COUMADIN) 3 MG tablet Take 3 mg by mouth See admin  instructions. Take with 5 mg tablet on Wednesday and Friday      . warfarin (COUMADIN) 5 MG tablet Take 5 mg by mouth as directed.      Derrill Memo ON 01/26/2014] furosemide (LASIX) 80 MG tablet Take 1 tablet (80 mg total) by mouth daily.  90 tablet  3   No current facility-administered medications for this visit.    Past Medical History  Diagnosis Date  . Cardiomyopathy, nonischemic     LVEF 35-40%  . Essential hypertension, benign   . Atrial fibrillation     Coumadin discontinued 11/13 with severe anemia, epistaxis  . Cardiac arrest 1998    ICD implanted at [facility]  . Chronic systolic heart failure   . Pneumonia     Severe with respiratory failure 10/13  . Anemia   . AVM (arteriovenous malformation)     Duodenum - nonbleeding and EGD 11/13  . History of colonic polyps     Colonoscopy 11/13    Past Surgical History  Procedure Laterality Date  . Cardiac defibrillator placement  1998  . Defibrillator system revision  04/12/12    MDT Protecta XT VR implanted at [facility] by Dr Deno Etienne with previously implanted system and leads extracted due to RV  lead failure  . Esophagogastroduodenoscopy  09/25/2012    Procedure: ESOPHAGOGASTRODUODENOSCOPY (EGD);  Surgeon: Beryle Beams, MD;  Location: [facility] ENDOSCOPY;  Service: Endoscopy;  Laterality: N/A;  . Colonoscopy  09/26/2012    Procedure: COLONOSCOPY;  Surgeon: Beryle Beams, MD;  Location: [place];  Service: Endoscopy;  Laterality: N/A;    Social History [name] reports that he quit smoking about 17 years ago. His smoking use included Cigarettes. He has a 4.8 pack-year smoking history. He has never used smokeless tobacco. [name] reports that he does not drink alcohol.  Review of Systems Negative except as outlined.  Physical Examination Filed Vitals:   01/22/14 0846  BP: 121/75  Pulse: 81   Filed Weights   01/22/14 0846  Weight: 284 lb (128.822 kg)    Comfortable. Tall stature. Overweight.  HEENT: Conjunctiva and  lids normal, oropharynx clear.  Neck: Supple, no elevated JVP or carotid bruits, no thyromegaly.  Lungs: Course but clear to auscultation, nonlabored breathing at rest.  Cardiac: Irregularly irregular, no S3 or significant systolic murmur, indistinct PMI.  Abdomen: Soft, nontender, bowel sounds present.  Extremities: Trace ankle edema, distal pulses 1-2+.  Skin: Warm and dry.  Musculoskeletal: No kyphosis.  Neuropsychiatric: Alert and oriented x3, affect grossly appropriate.   Problem List and Plan   Atrial fibrillation Continue strategy of heart rate control and anticoagulation. Coumadin is followed by Dr. Scotty [name].  Cardiomyopathy, nonischemic Continue current regimen except increase Lasix to 80 mg twice daily through Friday of this week. We discussed diet and fluid restriction, also obtaining daily weights. Prior baseline been in the 260s.  Essential hypertension, benign Blood pressure is stable today, well controlled.  CKD (chronic kidney disease) stage 2, GFR 60-89 ml/min Creatinine 0.9 as of September 2014.  IMPLANTATION OF DEFIBRILLATOR, HX OF Keep followup in the device clinic. No interval device shocks or syncope.    Satira Sark, M.D., F.A.C.C.

## 2014-01-22 NOTE — Assessment & Plan Note (Signed)
Creatinine 0.9 as of September 2014.

## 2014-01-22 NOTE — Assessment & Plan Note (Signed)
Continue strategy of heart rate control and anticoagulation. Coumadin is followed by Dr. Tapper. 

## 2014-01-22 NOTE — Assessment & Plan Note (Signed)
Blood pressure is stable today, well controlled.

## 2014-01-22 NOTE — Assessment & Plan Note (Signed)
Keep followup in the device clinic. No interval device shocks or syncope.

## 2014-01-24 IMAGING — CR DG CHEST 1V PORT
1 series · 1 of 1 positions shown · non-contrast
Comparison: One-view chest 09/07/2012.

CLINICAL DATA: Congestive heart failure and pneumonia.

PORTABLE CHEST - 1 VIEW

[AP]
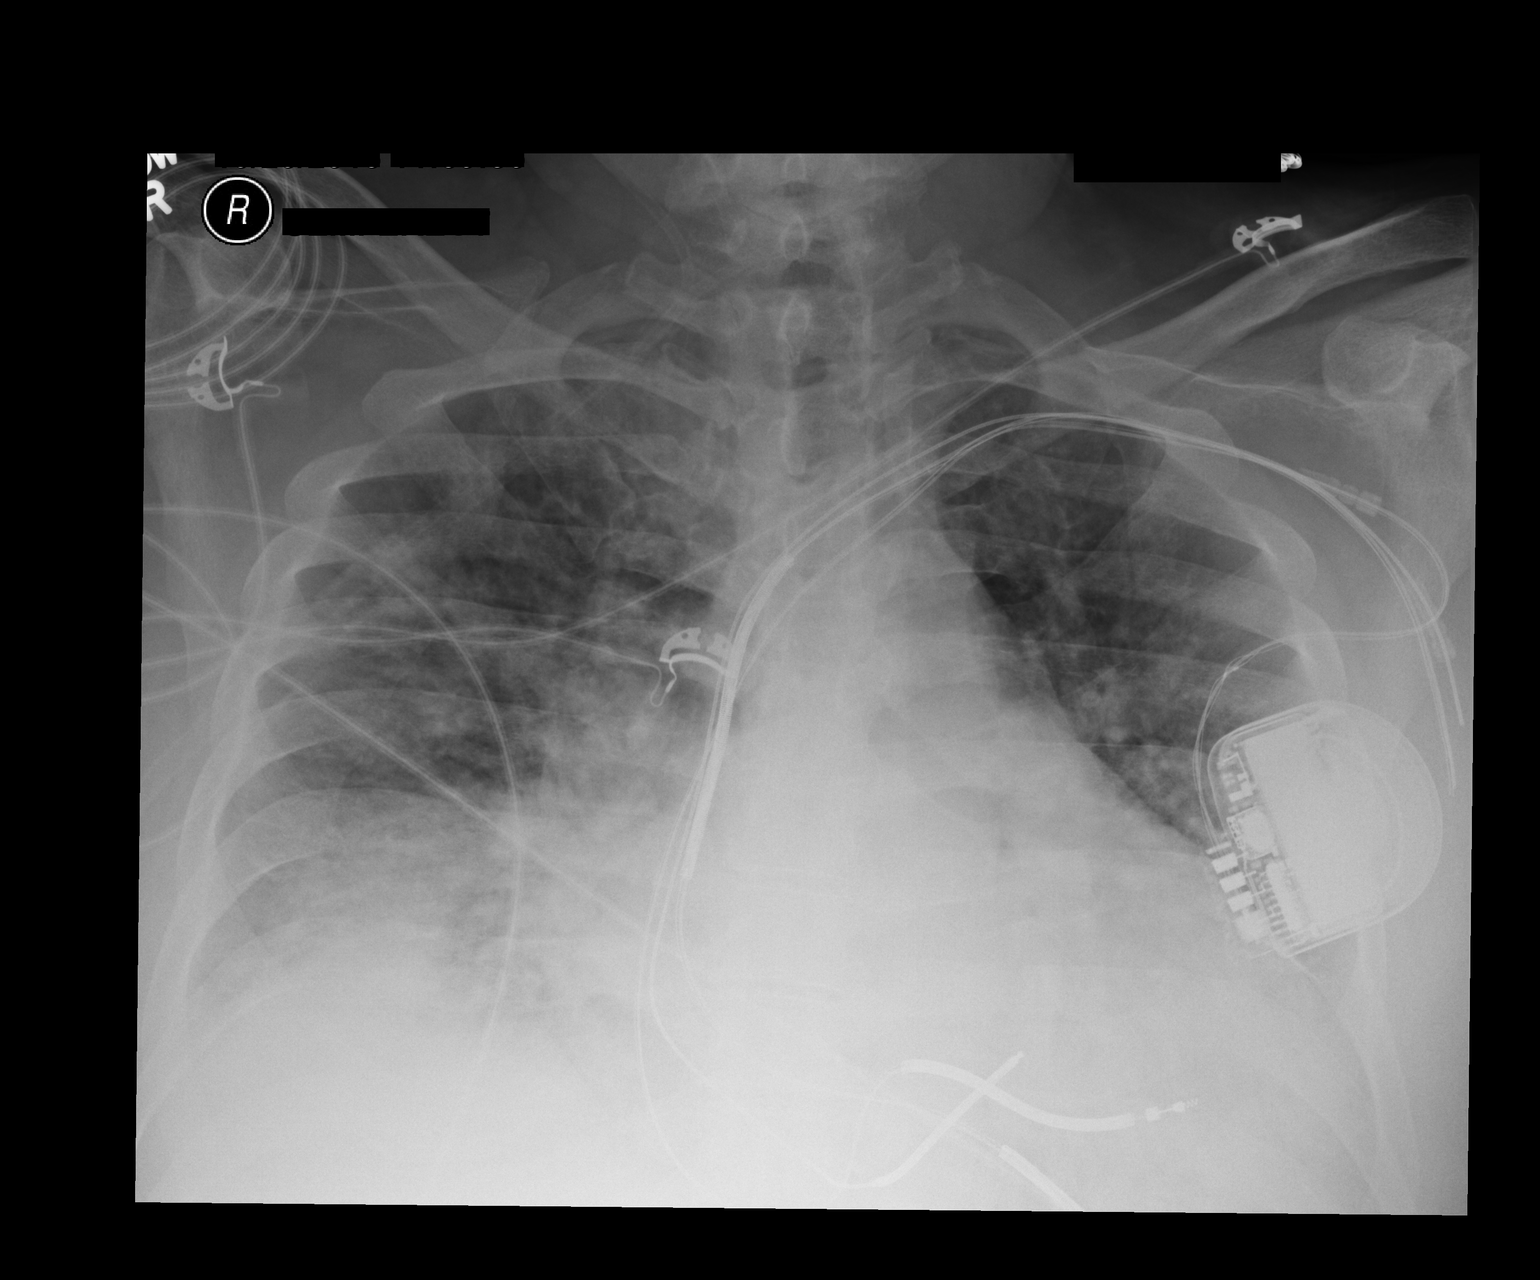

[1 of 1 positions shown; findings below may reference images not displayed]

FINDINGS: Cardiac enlargement is again noted.  There is further
loss of lung volumes.  Progressive bibasilar airspace opacities are
noted.  Mild edema is stable.  Bilateral pleural effusions are
suspected.
IMPRESSION: 1.  Continued volume loss and progressive bibasilar airspace
disease.  This may represent worsening atelectasis and edema
although bronchopneumonia is not excluded.
2.  Persistent interstitial disease compatible with edema and
congestive heart failure.

## 2014-01-27 IMAGING — CR DG CHEST 1V PORT
1 series · 1 of 1 positions shown · non-contrast
Comparison: Chest 09/09/2012 and 09/11/2012.

CLINICAL DATA: Shortness of breath.  Pneumonia.

PORTABLE CHEST - 1 VIEW

[AP]
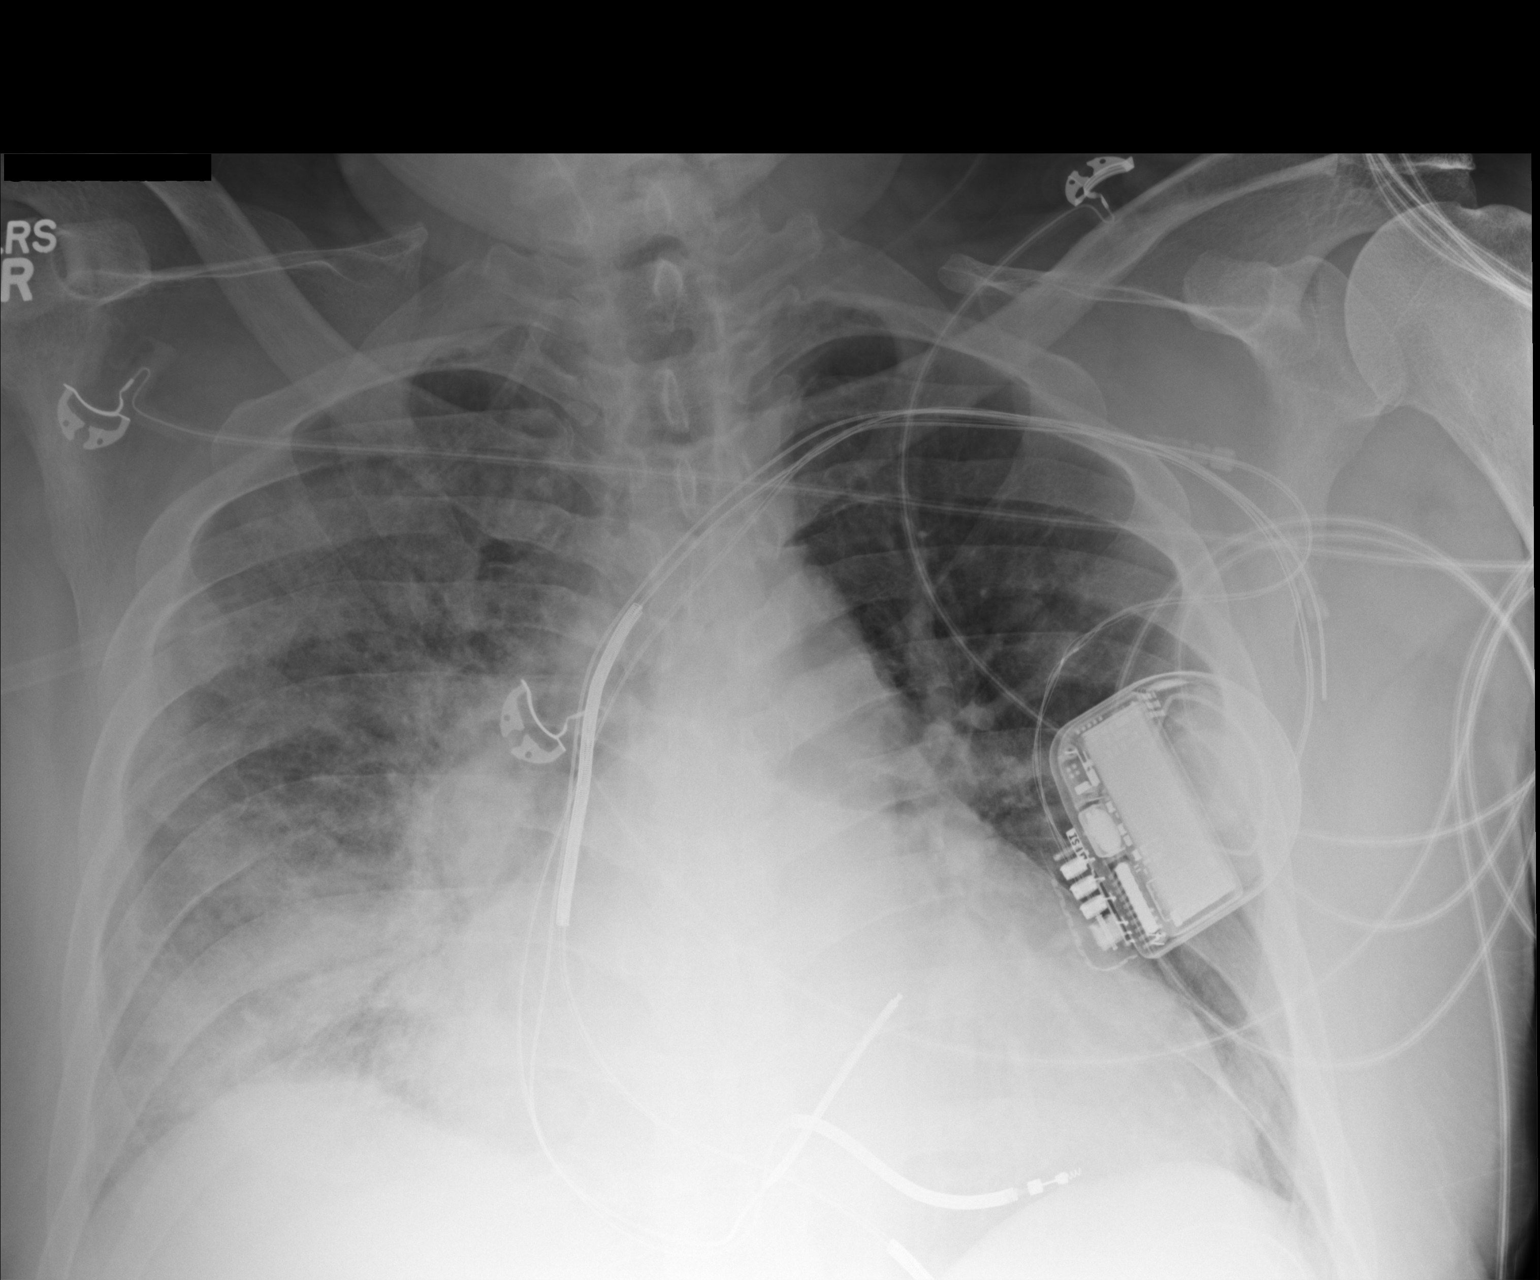

[1 of 1 positions shown; findings below may reference images not displayed]

FINDINGS: Extensive airspace disease throughout the right chest
does not appear changed since yesterday's study.  There is better
visualization of the left hemidiaphragm compatible with improved
aeration on today's exam.  Marked enlargement of the
cardiopericardial silhouette is again seen.
IMPRESSION: 1.  No change in extensive right side airspace disease most
consistent with pneumonia.
2.  Improved left basilar aeration.
3.  Marked enlargement cardiopericardial silhouette compatible with
cardiomegaly and/or pericardial effusion.

## 2014-01-29 IMAGING — CR DG CHEST 1V PORT
1 series · 1 of 1 positions shown · non-contrast
Comparison: 09/14/2012 at [DATE] p.m., [DATE] and 09/12/2012

CLINICAL DATA: Respiratory failure.  Pulmonary infiltrate.

PORTABLE CHEST - 1 VIEW [DATE] p.m.

[AP]
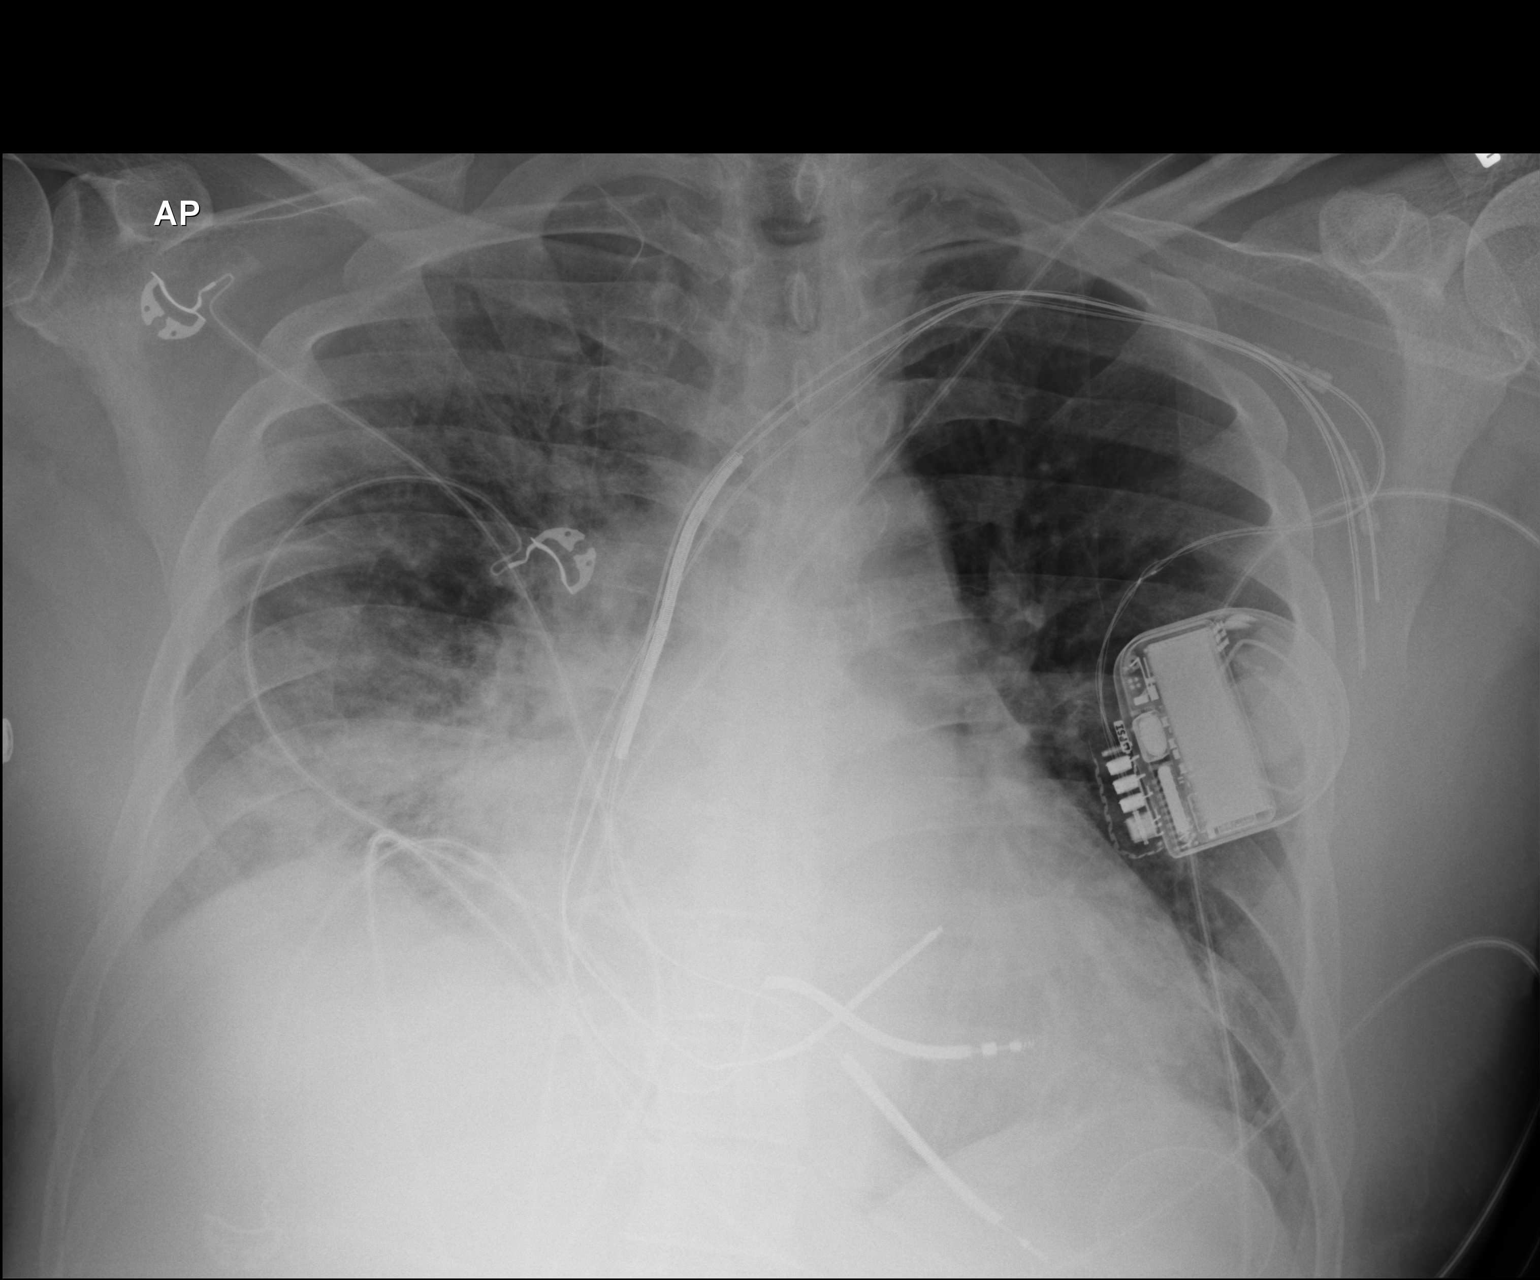

[1 of 1 positions shown; findings below may reference images not displayed]

FINDINGS: There has been partial clearing of the diffuse infiltrate
in the right lung, most prominent at the base.

Chronic cardiomegaly.  Pulmonary vascularity is normal.  Left lung
is clear.  Transvenous defibrillator in place.
IMPRESSION: Partial clearing of the infiltrate in the right lung.

## 2014-03-08 ENCOUNTER — Ambulatory Visit (INDEPENDENT_AMBULATORY_CARE_PROVIDER_SITE_OTHER): Payer: 59 | Admitting: *Deleted

## 2014-03-08 DIAGNOSIS — I428 Other cardiomyopathies: Secondary | ICD-10-CM

## 2014-03-08 DIAGNOSIS — I4891 Unspecified atrial fibrillation: Secondary | ICD-10-CM

## 2014-03-08 DIAGNOSIS — I5022 Chronic systolic (congestive) heart failure: Secondary | ICD-10-CM

## 2014-03-08 LAB — MDC_IDC_ENUM_SESS_TYPE_INCLINIC
Brady Statistic RV Percent Paced: 2.26 %
Date Time Interrogation Session: 20150424093908
HIGH POWER IMPEDANCE MEASURED VALUE: 53 Ohm
HighPow Impedance: 190 Ohm
HighPow Impedance: 342 Ohm
Lead Channel Pacing Threshold Amplitude: 1 V
Lead Channel Sensing Intrinsic Amplitude: 4.375 mV
MDC IDC MSMT BATTERY VOLTAGE: 3.19 V
MDC IDC MSMT LEADCHNL RV IMPEDANCE VALUE: 418 Ohm
MDC IDC MSMT LEADCHNL RV PACING THRESHOLD PULSEWIDTH: 0.4 ms
MDC IDC SET LEADCHNL RV PACING AMPLITUDE: 2.5 V
MDC IDC SET LEADCHNL RV PACING PULSEWIDTH: 0.4 ms
MDC IDC SET LEADCHNL RV SENSING SENSITIVITY: 0.3 mV
MDC IDC SET ZONE DETECTION INTERVAL: 320 ms
MDC IDC SET ZONE DETECTION INTERVAL: 360 ms
Zone Setting Detection Interval: 280 ms

## 2014-03-08 NOTE — Progress Notes (Signed)
ICD check in clinic. Battery voltage 3.19 V. Normal device function. Optivol stable at this time. No episodes recorded. ROV in 3 mths w/JA in [place].

## 2014-04-05 ENCOUNTER — Encounter: Payer: Self-pay | Admitting: Internal Medicine

## 2014-05-30 ENCOUNTER — Encounter: Payer: 59 | Admitting: Internal Medicine

## 2014-06-04 ENCOUNTER — Ambulatory Visit (INDEPENDENT_AMBULATORY_CARE_PROVIDER_SITE_OTHER): Payer: 59 | Admitting: Internal Medicine

## 2014-06-04 VITALS — BP 112/75 | HR 94 | Ht 77.0 in | Wt 288.0 lb

## 2014-06-04 DIAGNOSIS — I5022 Chronic systolic (congestive) heart failure: Secondary | ICD-10-CM

## 2014-06-04 DIAGNOSIS — I482 Chronic atrial fibrillation, unspecified: Secondary | ICD-10-CM

## 2014-06-04 DIAGNOSIS — I428 Other cardiomyopathies: Secondary | ICD-10-CM

## 2014-06-04 DIAGNOSIS — I4891 Unspecified atrial fibrillation: Secondary | ICD-10-CM

## 2014-06-04 LAB — MDC_IDC_ENUM_SESS_TYPE_INCLINIC
Battery Voltage: 3.17 V
Brady Statistic RV Percent Paced: 2.8 %
Date Time Interrogation Session: 20150721083302
HIGH POWER IMPEDANCE MEASURED VALUE: 54 Ohm
HighPow Impedance: 171 Ohm
HighPow Impedance: 304 Ohm
Lead Channel Pacing Threshold Pulse Width: 0.4 ms
Lead Channel Sensing Intrinsic Amplitude: 3.25 mV
Lead Channel Setting Pacing Pulse Width: 0.4 ms
Lead Channel Setting Sensing Sensitivity: 0.3 mV
MDC IDC MSMT LEADCHNL RV IMPEDANCE VALUE: 399 Ohm
MDC IDC MSMT LEADCHNL RV PACING THRESHOLD AMPLITUDE: 1 V
MDC IDC MSMT LEADCHNL RV SENSING INTR AMPL: 4.5 mV
MDC IDC SET LEADCHNL RV PACING AMPLITUDE: 2.5 V
Zone Setting Detection Interval: 280 ms
Zone Setting Detection Interval: 320 ms
Zone Setting Detection Interval: 360 ms

## 2014-06-04 NOTE — Patient Instructions (Signed)
   [name] [name] - 3 months for device check Your physician wants you to follow up in:  1 year.  You will receive a reminder letter in the mail one-two months in advance.  If you don't receive a letter, please call our office to schedule the follow up appointment - [place]  Continue all current medications.

## 2014-06-04 NOTE — Progress Notes (Signed)
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 male who presents today for routine electrophysiology followup.  Since last being seen in our clinic, the patient reports doing very well.  Today, he denies symptoms of palpitations, chest pain, shortness of breath,  lower extremity edema, dizziness, presyncope, syncope, or ICD shocks.  The patient is otherwise without complaint today.   Past Medical History  Diagnosis Date  . Cardiomyopathy, nonischemic     LVEF 35-40%  . Essential hypertension, benign   . Atrial fibrillation     Coumadin discontinued 11/13 with severe anemia, epistaxis  . Cardiac arrest 1998    ICD implanted at [facility]  . Chronic systolic heart failure   . Pneumonia     Severe with respiratory failure 10/13  . Anemia   . AVM (arteriovenous malformation)     Duodenum - nonbleeding and EGD 11/13  . History of colonic polyps     Colonoscopy 11/13   Past Surgical History  Procedure Laterality Date  . Cardiac defibrillator placement  1998  . Defibrillator system revision  04/12/12    MDT Protecta XT VR implanted at [facility] by Dr Deno Etienne with previously implanted system and leads extracted due to RV lead failure  . Esophagogastroduodenoscopy  09/25/2012    Procedure: ESOPHAGOGASTRODUODENOSCOPY (EGD);  Surgeon: Beryle Beams, MD;  Location: [facility] ENDOSCOPY;  Service: Endoscopy;  Laterality: N/A;  . Colonoscopy  09/26/2012    Procedure: COLONOSCOPY;  Surgeon: Beryle Beams, MD;  Location: [place];  Service: Endoscopy;  Laterality: N/A;    Current Outpatient Prescriptions  Medication Sig Dispense Refill  . carvedilol (COREG) 12.5 MG tablet Take 12.5 mg by mouth 2 (two) times daily with a meal.      . Cholecalciferol (VITAMIN D3) 2000 UNITS capsule Take 2,000 Units by mouth daily.        Marland Kitchen diltiazem (CARDIZEM CD) 240 MG 24 hr capsule Take 1 capsule (240 mg total) by mouth daily.      . finasteride (PROSCAR) 5 MG tablet Take 5 mg  by mouth daily.       . furosemide (LASIX) 80 MG tablet Take 1 tablet (80 mg total) by mouth daily.  90 tablet  3  . isosorbide mononitrate (IMDUR) 30 MG 24 hr tablet Take 1 tablet (30 mg total) by mouth daily.      Marland Kitchen lisinopril (PRINIVIL,ZESTRIL) 10 MG tablet Take 0.5 tablets (5 mg total) by mouth daily.  90 tablet  3  . polyethylene glycol powder (GLYCOLAX/MIRALAX) powder Take 17 g by mouth daily as needed. For constipation - mix with 8 oz liquid and drink      . potassium chloride (MICRO-K) 10 MEQ CR capsule Take 1 capsule (10 mEq total) by mouth daily.  90 capsule  3  . pravastatin (PRAVACHOL) 40 MG tablet Take 40 mg by mouth at bedtime.       . tamsulosin (FLOMAX) 0.4 MG CAPS capsule Take 0.4 mg by mouth daily.      Marland Kitchen warfarin (COUMADIN) 3 MG tablet Take 3 mg by mouth See admin instructions. Take with 5 mg tablet on Wednesday and Friday      . warfarin (COUMADIN) 5 MG tablet Take 5 mg by mouth as directed.       No current facility-administered medications for this visit.    Physical Exam: Filed Vitals:   06/04/14 0830  BP: 112/75  Pulse: 94  Height: 6\' 5"  (1.956 m)  Weight: 288 lb (130.636 kg)  SpO2: 96%    GEN- The patient is well appearing, alert and oriented x 3 today.   Head- normocephalic, atraumatic Eyes-  Sclera clear, conjunctiva pink Ears- hearing intact Oropharynx- clear Lungs- Clear to ausculation bilaterally, normal work of breathing Chest- ICD pocket is well healed Heart- irregular rate and rhythm, no murmurs, rubs or gallops, PMI not laterally displaced GI- soft, NT, ND, + BS Extremities- no clubbing, cyanosis, or edema  ICD interrogation- reviewed in detail today,  See PACEART report  Assessment and Plan:  1.  Chronic systolic dysfunction euvolemic today Stable on an appropriate medical regimen Normal ICD function See Pace Art report Impedance/ pacing/ sensing parameter are stable. No changes today  2. Permanent afib Continue anticoagulation long  term  Once we have the new wireless transmitters, we should send one.  He presently does not have a functional land line but does have the old MDT transmitter. Return to see Erasmo Downer in 3 months I will see in a year

## 2014-06-14 ENCOUNTER — Encounter: Payer: Self-pay | Admitting: Internal Medicine

## 2014-09-21 ENCOUNTER — Emergency Department (HOSPITAL_COMMUNITY)
Admission: EM | Admit: 2014-09-21 | Discharge: 2014-09-21 | Disposition: A | Discharge status: Home / Self Care | Payer: PRIVATE HEALTH INSURANCE | Attending: Emergency Medicine | Admitting: Emergency Medicine

## 2014-09-21 ENCOUNTER — Encounter (HOSPITAL_COMMUNITY): Payer: Self-pay | Admitting: *Deleted

## 2014-09-21 ENCOUNTER — Emergency Department (HOSPITAL_COMMUNITY): Payer: PRIVATE HEALTH INSURANCE

## 2014-09-21 DIAGNOSIS — Z86010 Personal history of colonic polyps: Secondary | ICD-10-CM | POA: Diagnosis not present

## 2014-09-21 DIAGNOSIS — M545 Low back pain: Secondary | ICD-10-CM | POA: Diagnosis present

## 2014-09-21 DIAGNOSIS — Z95810 Presence of automatic (implantable) cardiac defibrillator: Secondary | ICD-10-CM | POA: Diagnosis not present

## 2014-09-21 DIAGNOSIS — Z79899 Other long term (current) drug therapy: Secondary | ICD-10-CM | POA: Insufficient documentation

## 2014-09-21 DIAGNOSIS — I10 Essential (primary) hypertension: Secondary | ICD-10-CM | POA: Insufficient documentation

## 2014-09-21 DIAGNOSIS — Z8701 Personal history of pneumonia (recurrent): Secondary | ICD-10-CM | POA: Insufficient documentation

## 2014-09-21 DIAGNOSIS — I5022 Chronic systolic (congestive) heart failure: Secondary | ICD-10-CM | POA: Diagnosis not present

## 2014-09-21 DIAGNOSIS — Z7901 Long term (current) use of anticoagulants: Secondary | ICD-10-CM | POA: Insufficient documentation

## 2014-09-21 DIAGNOSIS — Z862 Personal history of diseases of the blood and blood-forming organs and certain disorders involving the immune mechanism: Secondary | ICD-10-CM | POA: Diagnosis not present

## 2014-09-21 DIAGNOSIS — I4891 Unspecified atrial fibrillation: Secondary | ICD-10-CM | POA: Insufficient documentation

## 2014-09-21 DIAGNOSIS — M549 Dorsalgia, unspecified: Secondary | ICD-10-CM

## 2014-09-21 DIAGNOSIS — Z87891 Personal history of nicotine dependence: Secondary | ICD-10-CM | POA: Insufficient documentation

## 2014-09-21 LAB — URINALYSIS, ROUTINE W REFLEX MICROSCOPIC
Bilirubin Urine: NEGATIVE
Glucose, UA: NEGATIVE mg/dL
Ketones, ur: NEGATIVE mg/dL
Leukocytes, UA: NEGATIVE
Nitrite: NEGATIVE
Protein, ur: NEGATIVE mg/dL
Specific Gravity, Urine: 1.02 (ref 1.005–1.030)
UROBILINOGEN UA: 0.2 mg/dL (ref 0.0–1.0)
pH: 6 (ref 5.0–8.0)

## 2014-09-21 LAB — URINE MICROSCOPIC-ADD ON

## 2014-09-21 MED ORDER — HYDROCODONE-ACETAMINOPHEN 5-325 MG PO TABS: ORAL_TABLET | ORAL | Status: DC

## 2014-09-21 MED ORDER — CYCLOBENZAPRINE HCL 10 MG PO TABS: 10.0000 mg | ORAL_TABLET | Freq: Three times a day (TID) | ORAL | Status: DC | PRN

## 2014-09-21 NOTE — ED Notes (Signed)
Pt states he is having lower back pain. It is worse with movement. Pt denies the pain moving down his legs.

## 2014-09-21 NOTE — Discharge Instructions (Signed)
Back Pain, Adult °Back pain is very common. The pain often gets better over time. The cause of back pain is usually not dangerous. Most people can learn to manage their back pain on their own.  °HOME CARE  °· Stay active. Start with short walks on flat ground if you can. Try to walk farther each day. °· Do not sit, drive, or stand in one place for more than 30 minutes. Do not stay in bed. °· Do not avoid exercise or work. Activity can help your back heal faster. °· Be careful when you bend or lift an object. Bend at your knees, keep the object close to you, and do not twist. °· Sleep on a firm mattress. Lie on your side, and bend your knees. If you lie on your back, put a pillow under your knees. °· Only take medicines as told by your doctor. °· Put ice on the injured area. °¨ Put ice in a plastic bag. °¨ Place a towel between your skin and the bag. °¨ Leave the ice on for 15-20 minutes, 03-04 times a day for the first 2 to 3 days. After that, you can switch between ice and heat packs. °· Ask your doctor about back exercises or massage. °· Avoid feeling anxious or stressed. Find good ways to deal with stress, such as exercise. °GET HELP RIGHT AWAY IF:  °· Your pain does not go away with rest or medicine. °· Your pain does not go away in 1 week. °· You have new problems. °· You do not feel well. °· The pain spreads into your legs. °· You cannot control when you poop (bowel movement) or pee (urinate). °· Your arms or legs feel weak or lose feeling (numbness). °· You feel sick to your stomach (nauseous) or throw up (vomit). °· You have belly (abdominal) pain. °· You feel like you may pass out (faint). °MAKE SURE YOU:  °· Understand these instructions. °· Will watch your condition. °· Will get help right away if you are not doing well or get worse. °Document Released: 04/19/2008 Document Revised: 01/24/2012 Document Reviewed: 03/05/2014 °ExitCare® Patient Information ©2015 [employer]. This information is not intended  to replace advice given to you by your health care provider. Make sure you discuss any questions you have with your health care provider. ° °

## 2014-09-21 NOTE — ED Notes (Signed)
Pt came to triage to inform nurse that he also has not had a BM in 2 days but states pain was present before this.

## 2014-09-21 NOTE — ED Notes (Signed)
Pt verbalized understanding of no driving within 4 hours of taking pain meds due to meds cause drowsiness

## 2014-09-23 NOTE — ED Provider Notes (Signed)
CSN: [id number]     Arrival date & time 09/21/14  1218 History   First MD Initiated Contact with Patient 09/21/14 1323     Chief Complaint  Patient presents with  . Back Pain     (Consider location/radiation/quality/duration/timing/severity/associated sxs/prior Treatment) HPI   [name] [name] is a 58 y.o. male who presents to the Emergency Department complaining of low back pain for several days.  He describes the pain as "soreness and aching" across his lower back that is worse with bending movements and improves with rest.  He states it aches at times into hip buttocks.  He has not tried any pain relievers prior to ED arrival.  He denies known injury, incontinence of bladder or bowel, abdominal pain, dysuria, saddle anesthesia's, or numbness or weakness of the LE's.    Past Medical History  Diagnosis Date  . Cardiomyopathy, nonischemic     LVEF 35-40%  . Essential hypertension, benign   . Atrial fibrillation     Coumadin discontinued 11/13 with severe anemia, epistaxis  . Cardiac arrest 1998    ICD implanted at [facility]  . Chronic systolic heart failure   . Pneumonia     Severe with respiratory failure 10/13  . Anemia   . AVM (arteriovenous malformation)     Duodenum - nonbleeding and EGD 11/13  . History of colonic polyps     Colonoscopy 11/13   Past Surgical History  Procedure Laterality Date  . Cardiac defibrillator placement  1998  . 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 Esophagogastroduodenoscopy  09/25/2012    Procedure: ESOPHAGOGASTRODUODENOSCOPY (EGD);  Surgeon: Beryle Beams, MD;  Location: [facility] ENDOSCOPY;  Service: Endoscopy;  Laterality: N/A;  . Colonoscopy  09/26/2012    Procedure: COLONOSCOPY;  Surgeon: Beryle Beams, MD;  Location: [place];  Service: Endoscopy;  Laterality: N/A;   Family History  Problem Relation Age of  Onset  . Diabetes Mother     Died @ 52.  . Diabetes Brother     Brother also has htn  . Hypertension Father     Alive @ 20.  Marland Kitchen Hypertension Brother   . Alzheimer's disease Father    History  Substance Use Topics  . Smoking status: Former Smoker -- 0.80 packs/day for 6 years    Types: Cigarettes    Quit date: 11/15/1996  . Smokeless tobacco: Never Used  . Alcohol Use: No    Review of Systems  Constitutional: Negative for fever.  Respiratory: Negative for shortness of breath.   Gastrointestinal: Negative for vomiting, abdominal pain and constipation.  Genitourinary: Negative for dysuria, hematuria, flank pain, decreased urine volume and difficulty urinating.       Low back pain  Musculoskeletal: Positive for back pain. Negative for joint swelling.  Skin: Negative for rash.  Neurological: Negative for weakness and numbness.  All other systems reviewed and are negative.     Allergies  Review of patient's allergies indicates no known allergies.  Home Medications   Prior to Admission medications   Medication Sig Start Date End Date Taking? Authorizing Provider  carvedilol (COREG) 12.5 MG tablet Take 12.5 mg by mouth 2 (two) times daily with a meal. 01/05/13  Yes Donney Dice, PA-C  Cholecalciferol (VITAMIN D3) 2000 UNITS capsule Take 2,000 Units by mouth daily.     Yes Historical Provider, MD  diltiazem (CARDIZEM CD) 240 MG 24 hr capsule Take 1 capsule (240 mg total) by mouth daily. 09/28/12  Yes Nita Sells, MD  finasteride (PROSCAR) 5 MG tablet Take 5 mg by mouth daily.    Yes Historical Provider, MD  furosemide (LASIX) 80 MG tablet Take 1 tablet (80 mg total) by mouth daily. 01/26/14  Yes Satira Sark, MD  isosorbide mononitrate (IMDUR) 30 MG 24 hr tablet Take 1 tablet (30 mg total) by mouth daily. 09/28/12  Yes Nita Sells, MD  lisinopril (PRINIVIL,ZESTRIL) 5 MG tablet Take 5 mg by mouth daily.   Yes Historical Provider, MD  potassium chloride  (MICRO-K) 10 MEQ CR capsule Take 1 capsule (10 mEq total) by mouth daily. 02/24/12  Yes Ezra Sites, MD  pravastatin (PRAVACHOL) 40 MG tablet Take 40 mg by mouth at bedtime.    Yes Historical Provider, MD  tamsulosin (FLOMAX) 0.4 MG CAPS capsule Take 0.4 mg by mouth daily. 07/23/13  Yes Historical Provider, MD  warfarin (COUMADIN) 3 MG tablet Take 3 mg by mouth See admin instructions. Taking with 5mg  for a total of 8mg    Yes Historical Provider, MD  warfarin (COUMADIN) 5 MG tablet Take 5 mg by mouth daily. Taking with 3mg  for a total of 8mg    Yes Historical Provider, MD  cyclobenzaprine (FLEXERIL) 10 MG tablet Take 1 tablet (10 mg total) by mouth 3 (three) times daily as needed. 09/21/14   Katoya Amato L. Melanee Cordial, PA-C  HYDROcodone-acetaminophen (NORCO/VICODIN) 5-325 MG per tablet Take one-two tabs po q 4-6 hrs prn pain 09/21/14   Wenceslao Loper L. Jaretssi Kraker, PA-C  lisinopril (PRINIVIL,ZESTRIL) 10 MG tablet Take 0.5 tablets (5 mg total) by mouth daily. Patient not taking: Reported on 09/21/2014 09/28/12   Nita Sells, MD  polyethylene glycol powder (GLYCOLAX/MIRALAX) powder Take 17 g by mouth daily as needed. For constipation - mix with 8 oz liquid and drink 04/19/12   Historical Provider, MD   BP 139/81 mmHg  Pulse 66  Temp(Src) 99 F (37.2 C) (Oral)  Resp 18  Ht 6\' 5"  (1.956 m)  Wt 288 lb (130.636 kg)  BMI 34.14 kg/m2  SpO2 98% Physical Exam  Constitutional: He is oriented to person, place, and time. He appears well-developed and well-nourished. No distress.  HENT:  Head: Normocephalic and atraumatic.  Neck: Normal range of motion. Neck supple.  Cardiovascular: Normal rate, regular rhythm, normal heart sounds and intact distal pulses.   No murmur heard. Pulmonary/Chest: Effort normal and breath sounds normal. No respiratory distress.  Abdominal: Soft. He exhibits no distension. There is no tenderness. There is no rebound and no guarding.  Musculoskeletal: He exhibits tenderness. He exhibits no  edema.       Lumbar back: He exhibits tenderness and pain. He exhibits normal range of motion, no swelling, no deformity, no laceration and normal pulse.  Diffuse ttp of the bilateral lumbar paraspinal muscles.  No spinal tenderness.  DP pulses are brisk and symmetrical.  Distal sensation intact.  Hip Flexors/Extensors are intact.  Pt has 5/5 strength against resistance of bilateral lower extremities.     Neurological: He is alert and oriented to person, place, and time. He has normal strength. No sensory deficit. He exhibits normal muscle tone. Coordination and gait normal.  Reflex Scores:      Patellar reflexes are 2+ on the right side and 2+ on the left side.      Achilles reflexes are 2+ on the right side and 2+ on the left side. Skin:  Skin is warm and dry. No rash noted.  Psychiatric: He has a normal mood and affect.  Nursing note and vitals reviewed.   ED Course  Procedures (including critical care time) Labs Review Labs Reviewed  URINALYSIS, ROUTINE W REFLEX MICROSCOPIC - Abnormal; Notable for the following:    Hgb urine dipstick TRACE (*)    All other components within normal limits  URINE MICROSCOPIC-ADD ON    Imaging Review Dg Lumbar Spine Complete  09/21/2014   CLINICAL DATA:  Back pain.  Constipation.  EXAM: LUMBAR SPINE - COMPLETE 4+ VIEW  COMPARISON:  Abdomen and pelvis CT dated 07/30/2013.  FINDINGS: Five non-rib-bearing lumbar vertebrae. Minimal dextroconvex scoliosis. Multilevel degenerative changes with stable anterior bone fusion at the L3-4 level. Extensive facet degenerative changes in the mid and lower lumbar spine. No fractures, pars defects or subluxations. Stable wall calcification in a splenic cyst.  IMPRESSION: Degenerative changes.  No acute abnormality.   Electronically Signed   By: Enrique Sack M.D.   On: 09/21/2014 14:36     EKG Interpretation None      MDM   Final diagnoses:  Back pain    Pt is well appearing, no focal neuro deficits.  Ambulates  with steady gait.  No concerning sx's for emergent neurological or infectious process.  Upon disposition, pt states that he has had constipation for 2-3 days and asks for advice, reports that this is a recurring issue and takes Miralax daily.  I have advised to increase dosage to twice daily for 4-5 days or until relief.  He denies abdominal pain, bloody stools, vomiting    Reality Dejonge L. Vanessa Waverly, PA-C 09/23/14 1641  Tanna Furry, MD 10/04/14 [phone]

## 2014-09-27 ENCOUNTER — Ambulatory Visit (INDEPENDENT_AMBULATORY_CARE_PROVIDER_SITE_OTHER): Payer: 59 | Admitting: *Deleted

## 2014-09-27 DIAGNOSIS — I428 Other cardiomyopathies: Secondary | ICD-10-CM

## 2014-09-27 DIAGNOSIS — I480 Paroxysmal atrial fibrillation: Secondary | ICD-10-CM

## 2014-09-27 DIAGNOSIS — I429 Cardiomyopathy, unspecified: Secondary | ICD-10-CM

## 2014-09-27 DIAGNOSIS — I5022 Chronic systolic (congestive) heart failure: Secondary | ICD-10-CM

## 2014-09-27 LAB — MDC_IDC_ENUM_SESS_TYPE_INCLINIC
Battery Voltage: 3.17 V
Date Time Interrogation Session: 20151113092728
HIGH POWER IMPEDANCE MEASURED VALUE: 62 Ohm
HighPow Impedance: 228 Ohm
HighPow Impedance: 361 Ohm
Lead Channel Impedance Value: 456 Ohm
Lead Channel Pacing Threshold Pulse Width: 0.4 ms
Lead Channel Sensing Intrinsic Amplitude: 4.25 mV
Lead Channel Setting Pacing Amplitude: 2.5 V
Lead Channel Setting Pacing Pulse Width: 0.4 ms
Lead Channel Setting Sensing Sensitivity: 0.3 mV
MDC IDC MSMT LEADCHNL RV PACING THRESHOLD AMPLITUDE: 0.75 V
MDC IDC SET ZONE DETECTION INTERVAL: 280 ms
MDC IDC SET ZONE DETECTION INTERVAL: 320 ms
MDC IDC STAT BRADY RV PERCENT PACED: 2.49 %
Zone Setting Detection Interval: 360 ms

## 2014-09-27 NOTE — Progress Notes (Signed)
ICD check in clinic. Battery voltage 3.17 V. Normal device function. No episodes recorded. No changes made this session. ROV in 3 mths w/device clinic and July with JA.

## 2014-10-16 ENCOUNTER — Encounter: Payer: Self-pay | Admitting: Internal Medicine

## 2014-10-18 ENCOUNTER — Telehealth: Payer: Self-pay | Admitting: Cardiology

## 2014-10-18 NOTE — Telephone Encounter (Signed)
LMOVM for pt to return call 

## 2014-10-18 NOTE — Telephone Encounter (Signed)
-----   Message from Patsey Berthold, RN sent at 10/16/2014  8:53 AM EST ----- Regarding: FW: patient question Louretta Shorten,   Can you call Mr Bjorkman and send him the information on how to get the [facility] monitor as part of the upgrade program?  Dr Rayann Heman then wanted him to be enrolled in the [facility] clinic.  Can you schedule an appt with heather 4 weeks after you send him information about obtaining cell adapter?  Thank you!  ----- Message -----    From: Thompson Grayer, MD    Sent: 06/04/2014   9:10 AM      To: Patsey Berthold, RN Subject: patient question                               When does the new wireless transmitter (MDT) come out? He has an old one but no functional land line.  I would like for him to fast track to the new transmitter and then start following with Heather.

## 2014-10-22 NOTE — Telephone Encounter (Signed)
Spoke w/ pt about ICM clinic. Pt verbalized understanding that we will be checking his fluid levels from his home monitor once a month. I informed pt that 1st scheduled remote transmission is 11-19-2014 at anytime. I informed pt that I would have his remote monitoring released by his previous clinic and I would order him a [employer]. Pt verbalized understanding. Spoke w/ Cecille Rubin w/ [place]. I informed her that pt has been seeing MD for a while and that we are going to start following pt remotely. I informed her that I had requested that pt be transferred in the [employer] system. She verbalized understanding and stated that she completed the transfer request.

## 2014-10-25 ENCOUNTER — Encounter: Payer: Self-pay | Admitting: Cardiology

## 2014-10-25 ENCOUNTER — Ambulatory Visit (INDEPENDENT_AMBULATORY_CARE_PROVIDER_SITE_OTHER): Payer: PRIVATE HEALTH INSURANCE | Admitting: Cardiology

## 2014-10-25 VITALS — BP 110/70 | HR 84 | Ht 76.0 in | Wt 271.0 lb

## 2014-10-25 DIAGNOSIS — I480 Paroxysmal atrial fibrillation: Secondary | ICD-10-CM

## 2014-10-25 DIAGNOSIS — I5022 Chronic systolic (congestive) heart failure: Secondary | ICD-10-CM

## 2014-10-25 DIAGNOSIS — I428 Other cardiomyopathies: Secondary | ICD-10-CM

## 2014-10-25 DIAGNOSIS — I429 Cardiomyopathy, unspecified: Secondary | ICD-10-CM

## 2014-10-25 DIAGNOSIS — I10 Essential (primary) hypertension: Secondary | ICD-10-CM

## 2014-10-25 NOTE — Assessment & Plan Note (Signed)
Symptomatically stable on current regimen. His weight is down compared to last visit. We discussed sodium restriction, regular walking regimen to try to better maintain his weight. Follow-up echocardiogram will be obtained to reassess LVEF, last study in 2013. Recent lab work will be requested from Dr. Scotty Court.

## 2014-10-25 NOTE — Progress Notes (Signed)
Reason for visit: Cardiomyopathy, atrial fibrillation, hypertension  Clinical Summary [name] is a 58 y.o.male last seen in March. He follows in the device clinic with Dr. Rayann [name] at this point. Normal device function noted earlier this month. He comes in for a regular visit today. Reports NYHA class II dyspnea. States that he has been trying to lose weight. No orthopnea or PND, no leg edema. He reports compliance with his medications including Coreg, Cardizem CD, Lasix, lisinopril, and Imdur. Weight is down about 10 pounds from earlier in the year. LVEF approximately 35-40% by echocardiogram in 2013, no recent follow-up studies.  He states that he had lab work done recently with Dr. Scotty [name], results to be requested.  He reports no palpitations with atrial fibrillation. Coumadin is followed by Dr. Scotty [name]. He has had no major bleeding problems.  Blood pressure today is well controlled. No significant change in the medications outlined below.  No Known Allergies  Current Outpatient Prescriptions  Medication Sig Dispense Refill  . carvedilol (COREG) 12.5 MG tablet Take 12.5 mg by mouth 2 (two) times daily with a meal.    . Cholecalciferol (VITAMIN D3) 2000 UNITS capsule Take 2,000 Units by mouth daily.      . cyclobenzaprine (FLEXERIL) 10 MG tablet Take 1 tablet (10 mg total) by mouth 3 (three) times daily as needed. 30 tablet 0  . diltiazem (CARDIZEM CD) 240 MG 24 hr capsule Take 1 capsule (240 mg total) by mouth daily.    . finasteride (PROSCAR) 5 MG tablet Take 5 mg by mouth daily.     . furosemide (LASIX) 80 MG tablet Take 1 tablet (80 mg total) by mouth daily. 90 tablet 3  . HYDROcodone-acetaminophen (NORCO/VICODIN) 5-325 MG per tablet Take one-two tabs po q 4-6 hrs prn pain 20 tablet 0  . isosorbide mononitrate (IMDUR) 30 MG 24 hr tablet Take 1 tablet (30 mg total) by mouth daily.    Marland Kitchen lisinopril (PRINIVIL,ZESTRIL) 10 MG tablet Take 0.5 tablets (5 mg total) by mouth daily. 90 tablet  3  . lisinopril (PRINIVIL,ZESTRIL) 5 MG tablet Take 5 mg by mouth daily.    . polyethylene glycol powder (GLYCOLAX/MIRALAX) powder Take 17 g by mouth daily as needed. For constipation - mix with 8 oz liquid and drink    . potassium chloride (MICRO-K) 10 MEQ CR capsule Take 1 capsule (10 mEq total) by mouth daily. 90 capsule 3  . pravastatin (PRAVACHOL) 40 MG tablet Take 40 mg by mouth at bedtime.     . tamsulosin (FLOMAX) 0.4 MG CAPS capsule Take 0.4 mg by mouth daily.    Marland Kitchen warfarin (COUMADIN) 3 MG tablet Take 3 mg by mouth See admin instructions. Taking with 5mg  for a total of 8mg     . warfarin (COUMADIN) 5 MG tablet Take 5 mg by mouth daily. Taking with 3mg  for a total of 8mg      No current facility-administered medications for this visit.    Past Medical History  Diagnosis Date  . Cardiomyopathy, nonischemic     LVEF 35-40%  . Essential hypertension, benign   . Atrial fibrillation   . Cardiac arrest 1998    ICD implanted at [facility]  . Chronic systolic heart failure   . Pneumonia     Severe with respiratory failure 10/13  . Anemia   . AVM (arteriovenous malformation)     Duodenum - nonbleeding and EGD 11/13  . History of colonic polyps     Colonoscopy 11/13    Past  Surgical History  Procedure Laterality Date  . Cardiac defibrillator placement  1998  . Defibrillator system revision  04/12/12    MDT Protecta XT VR implanted at [facility] by Dr Deno Etienne with previously implanted system and leads extracted due to RV lead failure  . Esophagogastroduodenoscopy  09/25/2012    Procedure: ESOPHAGOGASTRODUODENOSCOPY (EGD);  Surgeon: Beryle Beams, MD;  Location: [facility] ENDOSCOPY;  Service: Endoscopy;  Laterality: N/A;  . Colonoscopy  09/26/2012    Procedure: COLONOSCOPY;  Surgeon: Beryle Beams, MD;  Location: [place];  Service: Endoscopy;  Laterality: N/A;    Social History [name] reports that he quit smoking about 17 years ago. His smoking use included Cigarettes. He started  smoking about 36 years ago. He has a 4.8 pack-year smoking history. He has never used smokeless tobacco. [name] reports that he does not drink alcohol.  Review of Systems Complete review of systems negative except as otherwise outlined in the clinical summary and also the following  He denies any device shocks. Reports stable appetite. No chest pain. No syncope.  Physical Examination Filed Vitals:   10/25/14 0842  BP: 110/70  Pulse: 84   Filed Weights   10/25/14 0842  Weight: 271 lb (122.925 kg)   Comfortable. Tall stature. Overweight.  HEENT: Conjunctiva and lids normal, oropharynx clear.  Neck: Supple, no elevated JVP or carotid bruits, no thyromegaly.  Lungs: Course but clear to auscultation, nonlabored breathing at rest.  Cardiac: Irregularly irregular, no S3 or significant systolic murmur, indistinct PMI.  Abdomen: Soft, nontender, bowel sounds present.  Extremities: No pitting edema, distal pulses 1-2+.  Skin: Warm and dry.  Musculoskeletal: No kyphosis.  Neuropsychiatric: Alert and oriented x3, affect grossly appropriate.   Problem List and Plan   Chronic systolic heart failure Symptomatically stable on current regimen. His weight is down compared to last visit. We discussed sodium restriction, regular walking regimen to try to better maintain his weight. Follow-up echocardiogram will be obtained to reassess LVEF, last study in 2013. Recent lab work will be requested from Dr. Scotty [name].  Cardiomyopathy, nonischemic LVEF 35-40% by echo [name] in 2013. Continue medical therapy. Follow-up echocardiogram being obtained.  Atrial fibrillation Continue heart rate control and anticoagulation (Coumadin followed by Dr. Scotty [name]).  Essential hypertension, benign Blood pressure is well controlled today.    Satira Sark, M.D., F.A.C.C.

## 2014-10-25 NOTE — Assessment & Plan Note (Signed)
Blood pressure is well-controlled today. 

## 2014-10-25 NOTE — Patient Instructions (Signed)
Your physician recommends that you schedule a follow-up appointment in: 6 months. You will receive a reminder letter in the mail in about 4 months reminding you to call and schedule your appointment. If you don't receive this letter, please contact our office. Your physician recommends that you continue on your current medications as directed. Please refer to the Current Medication list given to you today. Your physician has requested that you have an echocardiogram. Echocardiography is a painless test that uses sound waves to create images of your heart. It provides your doctor with information about the size and shape of your heart and how well your heart's chambers and valves are working. This procedure takes approximately one hour. There are no restrictions for this procedure.  

## 2014-10-25 NOTE — Assessment & Plan Note (Addendum)
Continue heart rate control and anticoagulation (Coumadin followed by Dr. Scotty Court).

## 2014-10-25 NOTE — Assessment & Plan Note (Signed)
LVEF 35-40% by echo [name] in 2013. Continue medical therapy. Follow-up echocardiogram being obtained.

## 2014-10-31 ENCOUNTER — Other Ambulatory Visit: Payer: Self-pay

## 2014-10-31 ENCOUNTER — Other Ambulatory Visit (INDEPENDENT_AMBULATORY_CARE_PROVIDER_SITE_OTHER): Payer: PRIVATE HEALTH INSURANCE

## 2014-10-31 DIAGNOSIS — R0602 Shortness of breath: Secondary | ICD-10-CM

## 2014-10-31 DIAGNOSIS — I429 Cardiomyopathy, unspecified: Secondary | ICD-10-CM

## 2014-10-31 DIAGNOSIS — I428 Other cardiomyopathies: Secondary | ICD-10-CM

## 2014-11-05 ENCOUNTER — Telehealth: Payer: Self-pay | Admitting: *Deleted

## 2014-11-05 NOTE — Telephone Encounter (Signed)
-----   Message from Satira Sark, MD sent at 11/01/2014  8:43 AM EST ----- Reviewed report. LVEF 25-30% at this point, at least moderate mitral regurgitation, moderate pulmonary hypertension. Fortunately, he has been symptomatically stable and we will continue medical therapy.

## 2014-11-05 NOTE — Telephone Encounter (Signed)
Patient informed. 

## 2014-11-18 ENCOUNTER — Telehealth: Payer: Self-pay | Admitting: Cardiology

## 2014-11-18 NOTE — Telephone Encounter (Signed)
Spoke w/ pt and he still does not have his wirex accessory. I rescheduled appt for 12-12-14. Pt aware and agreed to this.

## 2014-12-12 ENCOUNTER — Encounter: Payer: Self-pay | Admitting: *Deleted

## 2014-12-12 ENCOUNTER — Telehealth: Payer: Self-pay | Admitting: Cardiology

## 2014-12-12 NOTE — Telephone Encounter (Signed)
Pt still has not received wirex and will send transmission when he receives wirex.

## 2014-12-20 ENCOUNTER — Telehealth: Payer: Self-pay | Admitting: Internal Medicine

## 2014-12-20 NOTE — Telephone Encounter (Signed)
New Message  Pt called states that the device just came in.. Please call back.

## 2014-12-20 NOTE — Telephone Encounter (Signed)
Informed pt that his 1st remote transmission is not until 01-10-15 and if he needs any help w/ his first transmission he can give our office a call. Pt verbalized understanding.

## 2015-01-10 ENCOUNTER — Telehealth: Payer: Self-pay | Admitting: Cardiology

## 2015-01-10 ENCOUNTER — Ambulatory Visit (INDEPENDENT_AMBULATORY_CARE_PROVIDER_SITE_OTHER): Payer: Medicare Other | Admitting: *Deleted

## 2015-01-10 DIAGNOSIS — I428 Other cardiomyopathies: Secondary | ICD-10-CM

## 2015-01-10 DIAGNOSIS — I5022 Chronic systolic (congestive) heart failure: Secondary | ICD-10-CM

## 2015-01-10 DIAGNOSIS — I429 Cardiomyopathy, unspecified: Secondary | ICD-10-CM

## 2015-01-10 LAB — MDC_IDC_ENUM_SESS_TYPE_INCLINIC
Battery Voltage: 3.16 V
Date Time Interrogation Session: 20160226115541
HIGH POWER IMPEDANCE MEASURED VALUE: 58 Ohm
HighPow Impedance: 190 Ohm
HighPow Impedance: 342 Ohm
Lead Channel Pacing Threshold Amplitude: 0.75 V
Lead Channel Pacing Threshold Pulse Width: 0.4 ms
Lead Channel Setting Sensing Sensitivity: 0.3 mV
MDC IDC MSMT LEADCHNL RV IMPEDANCE VALUE: 456 Ohm
MDC IDC MSMT LEADCHNL RV SENSING INTR AMPL: 3.875 mV
MDC IDC SET LEADCHNL RV PACING AMPLITUDE: 2.5 V
MDC IDC SET LEADCHNL RV PACING PULSEWIDTH: 0.4 ms
MDC IDC SET ZONE DETECTION INTERVAL: 360 ms
MDC IDC STAT BRADY RV PERCENT PACED: 1.97 %
Zone Setting Detection Interval: 280 ms
Zone Setting Detection Interval: 320 ms

## 2015-01-10 NOTE — Progress Notes (Signed)
See paceart note Normal device function.  Carelink 04-10-15 and ROV 06-20-15 @ 845 with [facility].

## 2015-01-10 NOTE — Patient Instructions (Signed)
Carelink transmission 04-10-15 ROV with Dr Rayann Heman 06-20-15 at 845 in [place] office Plug monitor and cell adapter into power outlet and send manuel transmission. Call device clinic [phone] to make sure transmission was received. Monitor should automatically send transmissions every 3 months. If transmission is not received the device clinic will contact you.

## 2015-01-10 NOTE — Telephone Encounter (Signed)
Spoke w/ pt walked him through manual transmission.

## 2015-01-20 ENCOUNTER — Telehealth: Payer: Self-pay | Admitting: Cardiology

## 2015-01-20 NOTE — Telephone Encounter (Signed)
Pt does not want to do ICM clinic at this time.

## 2015-01-20 NOTE — Telephone Encounter (Signed)
-----   Message from Thompson Grayer, MD sent at 01/12/2015  8:32 PM EST ----- Please enroll in [facility] device clinic with [facility].  He has difficulty understanding carelink and may need additional assistance.

## 2015-01-20 NOTE — Telephone Encounter (Signed)
LMOVM for pt to return call 

## 2015-01-24 ENCOUNTER — Encounter: Payer: Self-pay | Admitting: Internal Medicine

## 2015-03-03 ENCOUNTER — Telehealth: Payer: Self-pay | Admitting: Cardiology

## 2015-03-03 MED ORDER — FUROSEMIDE 80 MG PO TABS: 80.0000 mg | ORAL_TABLET | Freq: Every day | ORAL | Status: DC

## 2015-03-03 NOTE — Telephone Encounter (Signed)
Medication sent to pharmacy with 90 day supply as requested

## 2015-03-28 ENCOUNTER — Telehealth: Payer: Self-pay | Admitting: *Deleted

## 2015-04-10 ENCOUNTER — Ambulatory Visit (INDEPENDENT_AMBULATORY_CARE_PROVIDER_SITE_OTHER): Payer: Medicare Other | Admitting: *Deleted

## 2015-04-10 DIAGNOSIS — I429 Cardiomyopathy, unspecified: Secondary | ICD-10-CM | POA: Diagnosis not present

## 2015-04-10 DIAGNOSIS — I428 Other cardiomyopathies: Secondary | ICD-10-CM

## 2015-04-10 DIAGNOSIS — I5022 Chronic systolic (congestive) heart failure: Secondary | ICD-10-CM

## 2015-04-10 NOTE — Progress Notes (Signed)
Remote ICD transmission.   

## 2015-04-17 LAB — CUP PACEART REMOTE DEVICE CHECK
Date Time Interrogation Session: 20160526174242
HighPow Impedance: 342 Ohm
HighPow Impedance: 58 Ohm
Lead Channel Pacing Threshold Pulse Width: 0.4 ms
Lead Channel Sensing Intrinsic Amplitude: 4 mV
Lead Channel Setting Pacing Amplitude: 2.5 V
Lead Channel Setting Pacing Pulse Width: 0.4 ms
Lead Channel Setting Sensing Sensitivity: 0.3 mV
MDC IDC MSMT BATTERY VOLTAGE: 3.15 V
MDC IDC MSMT LEADCHNL RV IMPEDANCE VALUE: 418 Ohm
MDC IDC MSMT LEADCHNL RV PACING THRESHOLD AMPLITUDE: 0.75 V
MDC IDC SET ZONE DETECTION INTERVAL: 280 ms
MDC IDC SET ZONE DETECTION INTERVAL: 360 ms
MDC IDC STAT BRADY RV PERCENT PACED: 1.3 %
Zone Setting Detection Interval: 320 ms

## 2015-04-23 ENCOUNTER — Encounter: Payer: Self-pay | Admitting: Cardiology

## 2015-04-24 ENCOUNTER — Encounter: Payer: Self-pay | Admitting: Cardiology

## 2015-04-24 ENCOUNTER — Ambulatory Visit (INDEPENDENT_AMBULATORY_CARE_PROVIDER_SITE_OTHER): Payer: Medicare Other | Admitting: Cardiology

## 2015-04-24 VITALS — BP 124/82 | HR 69 | Ht 77.0 in | Wt 281.0 lb

## 2015-04-24 DIAGNOSIS — I482 Chronic atrial fibrillation, unspecified: Secondary | ICD-10-CM

## 2015-04-24 DIAGNOSIS — I429 Cardiomyopathy, unspecified: Secondary | ICD-10-CM | POA: Diagnosis not present

## 2015-04-24 DIAGNOSIS — I10 Essential (primary) hypertension: Secondary | ICD-10-CM | POA: Diagnosis not present

## 2015-04-24 DIAGNOSIS — I428 Other cardiomyopathies: Secondary | ICD-10-CM

## 2015-04-24 NOTE — Progress Notes (Signed)
Cardiology Office Note  Date: 04/24/2015   ID: [name] [name], DOB 1955-12-07, MRN [id number]  PCP: Deloria Lair, MD  Primary Cardiologist: Rozann Lesches, MD   Chief Complaint  Patient presents with  . Cardiomyopathy  . Atrial Fibrillation    History of Present Illness: [name] [name] is a 59 y.o. male last seen in December 2015. He presents for a routine follow-up visit. We discussed his medications, there have been no significant changes in his cardiac regimen. He reports NYHA class II dyspnea, no chest pain, palpitations, or device discharges.  He continues to follow in the device clinic. He does complete remote transmissions.  He continues on Coumadin, followed by Dr. Scotty Court. He denies any bleeding problems.  Follow-up echocardiogram from December 2015 is noted below.  Past Medical History  Diagnosis Date  . Cardiomyopathy, nonischemic     LVEF 35-40%  . Essential hypertension, benign   . Atrial fibrillation   . Cardiac arrest 1998    ICD implanted at [facility]  . Chronic systolic heart failure   . Pneumonia     Severe with respiratory failure 10/13  . Anemia   . AVM (arteriovenous malformation)     Duodenum - nonbleeding and EGD 11/13  . History of colonic polyps     Colonoscopy 11/13     Current Outpatient Prescriptions  Medication Sig Dispense Refill  . carvedilol (COREG) 12.5 MG tablet Take 12.5 mg by mouth 2 (two) times daily with a meal.    . Cholecalciferol (VITAMIN D3) 2000 UNITS capsule Take 2,000 Units by mouth daily.      . cyclobenzaprine (FLEXERIL) 10 MG tablet Take 1 tablet (10 mg total) by mouth 3 (three) times daily as needed. 30 tablet 0  . diltiazem (CARDIZEM CD) 240 MG 24 hr capsule Take 1 capsule (240 mg total) by mouth daily.    . finasteride (PROSCAR) 5 MG tablet Take 5 mg by mouth daily.     . furosemide (LASIX) 80 MG tablet Take 1 tablet (80 mg total) by mouth daily. 90 tablet 3  . HYDROcodone-acetaminophen (NORCO/VICODIN) 5-325 MG  per tablet Take one-two tabs po q 4-6 hrs prn pain 20 tablet 0  . isosorbide mononitrate (IMDUR) 30 MG 24 hr tablet Take 1 tablet (30 mg total) by mouth daily.    Marland Kitchen lisinopril (PRINIVIL,ZESTRIL) 10 MG tablet Take 0.5 tablets (5 mg total) by mouth daily. 90 tablet 3  . lisinopril (PRINIVIL,ZESTRIL) 5 MG tablet Take 5 mg by mouth daily.    . polyethylene glycol powder (GLYCOLAX/MIRALAX) powder Take 17 g by mouth daily as needed. For constipation - mix with 8 oz liquid and drink    . potassium chloride (MICRO-K) 10 MEQ CR capsule Take 1 capsule (10 mEq total) by mouth daily. 90 capsule 3  . pravastatin (PRAVACHOL) 40 MG tablet Take 40 mg by mouth at bedtime.     . tamsulosin (FLOMAX) 0.4 MG CAPS capsule Take 0.4 mg by mouth daily.    Marland Kitchen warfarin (COUMADIN) 3 MG tablet Take 3 mg by mouth See admin instructions. Taking with 5mg  for a total of 8mg     . warfarin (COUMADIN) 5 MG tablet Take 5 mg by mouth daily. Taking with 3mg  for a total of 8mg      No current facility-administered medications for this visit.    Allergies:  Review of patient's allergies indicates no known allergies.   Social History: The patient  reports that he quit smoking about 18 years ago. His  smoking use included Cigarettes. He started smoking about 36 years ago. He has a 4.8 pack-year smoking history. He has never used smokeless tobacco. He reports that he uses illicit drugs (Marijuana). He reports that he does not drink alcohol.   ROS:  Please see the history of present illness. Otherwise, complete review of systems is positive for none.  All other systems are reviewed and negative.   Physical Exam: VS:  BP 124/82 mmHg  Pulse 69  Ht 6\' 5"  (1.956 m)  Wt 281 lb (127.461 kg)  BMI 33.31 kg/m2  SpO2 98%, BMI Body mass index is 33.31 kg/(m^2).  Wt Readings from Last 3 Encounters:  04/24/15 281 lb (127.461 kg)  10/25/14 271 lb (122.925 kg)  09/21/14 288 lb (130.636 kg)     Comfortable. Tall stature. Overweight.  HEENT:  Conjunctiva and lids normal, oropharynx clear.  Neck: Supple, no elevated JVP or carotid bruits, no thyromegaly.  Lungs: Course but clear to auscultation, nonlabored breathing at rest.  Cardiac: Irregularly irregular, no S3 or significant systolic murmur, indistinct PMI.  Abdomen: Soft, nontender, bowel sounds present.  Extremities: No pitting edema, distal pulses 1-2+.  Skin: Warm and dry.  Musculoskeletal: No kyphosis.  Neuropsychiatric: Alert and oriented x3, affect grossly appropriate.   ECG: ECG is ordered today and shows atrial fibrillation.   Recent Labwork:  07/26/2014: BUN 14, creatinine 1.0, AST 22, ALT 20, hemoglobin A1c 5.6, cholesterol 83, HDL 32, LDL 41, triglycerides 51  Other Studies Reviewed Today:  Echocardiogram 10/31/2014: Study Conclusions  - Left ventricle: The cavity size was normal. Wall thickness was increased in a pattern of mild LVH. Systolic function was severely reduced. The estimated ejection fraction was in the range of 25% to 30%. - Aortic valve: Mildly calcified annulus. Trileaflet; mildly thickened leaflets. Valve area (VTI): 3.32 cm^2. Valve area (Vmax): 3.07 cm^2. Valve area (Vmean): 2.68 cm^2. - Mitral valve: Mildly to moderately calcified annulus. Mildly thickened leaflets . - Left atrium: The atrium was severely dilated. - Right ventricle: The cavity size was mildly to moderately dilated. Systolic function was normal. RV TAPSE is 2.0 cm. - Right atrium: The atrium was severely dilated. - Tricuspid valve: There is at least moderate eccentric TR. Severity may be underestimated by the eccentricity of the jet. The pacemaker lead likely affects closure of the septal TV leaflet. There was moderate regurgitation. - Pulmonary arteries: Systolic pressure was moderately increased. PA peak pressure: 52 mm Hg (S). - Systemic veins: The IVC is dilated with normal respiratory collapse, estimated RA pressure is 8  mmHg. - Pericardium, extracardiac: There is a small circumferential pericardial effusion. There is no tamponade physiology by echo. - Technically difficult study.   Assessment and Plan:  1. Nonischemic cardiomyopathy with LVEF 25-30%. He is symptomatically stable on current medical regimen. I encouraged weight loss and regular walking regimen for exercise.  2. Chronic atrial fibrillation, continue strategy of heart rate control and anticoagulation. Coumadin is followed by Dr. Scotty Court.  3. Status post Medtronic ICD, impllanted at [facility], followed by Dr. Rayann Heman in the device clinic.  4. Essential hypertension, blood pressure is normal today.  Current medicines were reviewed with the patient today.   Orders Placed This Encounter  Procedures  . EKG 12-Lead    Disposition: FU with me in 6 months.   Signed, Satira Sark, MD, [facility] 04/24/2015 8:32 AM    [facility] [place], [place], [place] [address] Phone: [phone]; Fax: [phone]

## 2015-04-24 NOTE — Patient Instructions (Signed)
Your physician recommends that you continue on your current medications as directed. Please refer to the Current Medication list given to you today. Your physician recommends that you schedule a follow-up appointment in: 6 months. You will receive a reminder letter in the mail in about 4 months reminding you to call and schedule your appointment. If you don't receive this letter, please contact our office. 

## 2015-04-28 ENCOUNTER — Encounter: Payer: Self-pay | Admitting: Internal Medicine

## 2015-05-30 NOTE — Telephone Encounter (Signed)
Spoke w/pt and answered questions about monitor.

## 2015-06-20 ENCOUNTER — Encounter: Payer: Self-pay | Admitting: Internal Medicine

## 2015-06-20 ENCOUNTER — Ambulatory Visit (INDEPENDENT_AMBULATORY_CARE_PROVIDER_SITE_OTHER): Payer: Medicare Other | Admitting: Internal Medicine

## 2015-06-20 VITALS — BP 120/88 | HR 73 | Ht 77.0 in | Wt 285.0 lb

## 2015-06-20 DIAGNOSIS — I428 Other cardiomyopathies: Secondary | ICD-10-CM

## 2015-06-20 DIAGNOSIS — I429 Cardiomyopathy, unspecified: Secondary | ICD-10-CM

## 2015-06-20 DIAGNOSIS — I482 Chronic atrial fibrillation, unspecified: Secondary | ICD-10-CM

## 2015-06-20 DIAGNOSIS — I5022 Chronic systolic (congestive) heart failure: Secondary | ICD-10-CM | POA: Diagnosis not present

## 2015-06-20 LAB — CUP PACEART INCLINIC DEVICE CHECK
Battery Voltage: 3.14 V
Brady Statistic RV Percent Paced: 5.05 %
Date Time Interrogation Session: 20160805121003
HIGH POWER IMPEDANCE MEASURED VALUE: 304 Ohm
HIGH POWER IMPEDANCE MEASURED VALUE: 53 Ohm
HighPow Impedance: 171 Ohm
Lead Channel Impedance Value: 361 Ohm
Lead Channel Pacing Threshold Amplitude: 0.75 V
Lead Channel Pacing Threshold Pulse Width: 0.4 ms
Lead Channel Sensing Intrinsic Amplitude: 3.875 mV
Lead Channel Setting Pacing Amplitude: 2.5 V
Lead Channel Setting Pacing Pulse Width: 0.4 ms
Lead Channel Setting Sensing Sensitivity: 0.3 mV
MDC IDC SET ZONE DETECTION INTERVAL: 280 ms
MDC IDC SET ZONE DETECTION INTERVAL: 320 ms
MDC IDC SET ZONE DETECTION INTERVAL: 360 ms

## 2015-06-20 NOTE — Patient Instructions (Addendum)
Your physician recommends that you continue on your current medications as directed. Please refer to the Current Medication list given to you today. Device check 09/22/15. Your physician recommends that you schedule a follow-up appointment in: 1 year. You will receive a reminder letter in the mail in about 10 months reminding you to call and schedule your appointment. If you don't receive this letter, please contact our office. You are being enrolled into the ICM clinic.

## 2015-06-20 NOTE — Progress Notes (Signed)
PCP: Deloria Lair, MD Primary Cardiologist:  Dr Sindy Messing is a 59 y.o. male who presents today for routine electrophysiology followup.  Since last being seen in our clinic, the patient reports doing very well.  Today, he denies symptoms of palpitations, chest pain, shortness of breath,  lower extremity edema, dizziness, presyncope, syncope, or ICD shocks.  The patient is otherwise without complaint today.   Past Medical History  Diagnosis Date  . Cardiomyopathy, nonischemic     LVEF 35-40%  . Essential hypertension, benign   . Atrial fibrillation   . Cardiac arrest 1998    ICD implanted at [facility]  . Chronic systolic heart failure   . Pneumonia     Severe with respiratory failure 10/13  . Anemia   . AVM (arteriovenous malformation)     Duodenum - nonbleeding and EGD 11/13  . History of colonic polyps     Colonoscopy 11/13   Past Surgical History  Procedure Laterality Date  . Cardiac defibrillator placement  1998  . Defibrillator system revision  04/12/12    MDT Protecta XT VR implanted at [facility] by Dr Deno Etienne with previously implanted system and leads extracted due to RV lead failure  . Esophagogastroduodenoscopy  09/25/2012    Procedure: ESOPHAGOGASTRODUODENOSCOPY (EGD);  Surgeon: Beryle Beams, MD;  Location: [facility] ENDOSCOPY;  Service: Endoscopy;  Laterality: N/A;  . Colonoscopy  09/26/2012    Procedure: COLONOSCOPY;  Surgeon: Beryle Beams, MD;  Location: [facility];  Service: Endoscopy;  Laterality: N/A;    Current Outpatient Prescriptions  Medication Sig Dispense Refill  . carvedilol (COREG) 12.5 MG tablet Take 12.5 mg by mouth 2 (two) times daily with a meal.    . Cholecalciferol (VITAMIN D3) 2000 UNITS capsule Take 2,000 Units by mouth daily.      . cyclobenzaprine (FLEXERIL) 10 MG tablet Take 1 tablet (10 mg total) by mouth 3 (three) times daily as needed. 30 tablet 0  . diltiazem (CARDIZEM CD) 240 MG 24 hr capsule Take 1 capsule (240 mg total) by  mouth daily.    . finasteride (PROSCAR) 5 MG tablet Take 5 mg by mouth daily.     . furosemide (LASIX) 80 MG tablet Take 1 tablet (80 mg total) by mouth daily. 90 tablet 3  . HYDROcodone-acetaminophen (NORCO/VICODIN) 5-325 MG per tablet Take one-two tabs po q 4-6 hrs prn pain 20 tablet 0  . isosorbide mononitrate (IMDUR) 30 MG 24 hr tablet Take 1 tablet (30 mg total) by mouth daily.    Marland Kitchen lisinopril (PRINIVIL,ZESTRIL) 10 MG tablet Take 0.5 tablets (5 mg total) by mouth daily. 90 tablet 3  . lisinopril (PRINIVIL,ZESTRIL) 5 MG tablet Take 5 mg by mouth daily.    . polyethylene glycol powder (GLYCOLAX/MIRALAX) powder Take 17 g by mouth daily as needed. For constipation - mix with 8 oz liquid and drink    . potassium chloride (MICRO-K) 10 MEQ CR capsule Take 1 capsule (10 mEq total) by mouth daily. 90 capsule 3  . pravastatin (PRAVACHOL) 40 MG tablet Take 40 mg by mouth at bedtime.     . tamsulosin (FLOMAX) 0.4 MG CAPS capsule Take 0.4 mg by mouth daily.    Marland Kitchen warfarin (COUMADIN) 2 MG tablet Take 2 mg by mouth daily.    Marland Kitchen warfarin (COUMADIN) 5 MG tablet Take 5 mg by mouth daily. Taking with 3mg  for a total of 8mg      No current facility-administered medications for this visit.    Physical Exam:  Filed Vitals:   06/20/15 0834  BP: 120/88  Pulse: 73  Height: 6\' 5"  (1.956 m)  Weight: 129.275 kg (285 lb)  SpO2: 97%    GEN- The patient is well appearing, alert and oriented x 3 today.   Head- normocephalic, atraumatic Eyes-  Sclera clear, conjunctiva pink Ears- hearing intact Oropharynx- clear Lungs- Clear to ausculation bilaterally, normal work of breathing Chest- ICD pocket is well healed Heart- irregular rate and rhythm, no murmurs, rubs or gallops, PMI not laterally displaced GI- soft, NT, ND, + BS Extremities- no clubbing, cyanosis, or edema  ICD interrogation- reviewed in detail today,  See PACEART report  Assessment and Plan:  1.  Chronic systolic dysfunction euvolemic  today Stable on an appropriate medical regimen Normal ICD function See Pace Art report Impedance/ pacing/ sensing parameter are stable. No changes today Will enroll in [facility] clinic  2. Permanent afib Continue anticoagulation long term  carelink Enroll in ICM clinic I will see in a year

## 2015-07-07 ENCOUNTER — Telehealth: Payer: Self-pay | Admitting: Internal Medicine

## 2015-07-07 NOTE — Telephone Encounter (Signed)
New message      Pt thinks Pamala Hurry called him.  He is returning her call

## 2015-07-07 NOTE — Telephone Encounter (Signed)
Pt made aware no attempt was made to reach him. Confirmed ICM check for Thursday.

## 2015-07-10 ENCOUNTER — Ambulatory Visit (INDEPENDENT_AMBULATORY_CARE_PROVIDER_SITE_OTHER): Payer: Medicare Other

## 2015-07-10 DIAGNOSIS — I5022 Chronic systolic (congestive) heart failure: Secondary | ICD-10-CM | POA: Diagnosis not present

## 2015-07-10 DIAGNOSIS — Z95810 Presence of automatic (implantable) cardiac defibrillator: Secondary | ICD-10-CM

## 2015-07-11 ENCOUNTER — Telehealth: Payer: Self-pay

## 2015-07-11 NOTE — Telephone Encounter (Signed)
ICM transmission received 07/10/2015.  Call to patient, he stated he was on his way to see his brother that is in the hospital.  I stated I would try him back another day.

## 2015-07-14 NOTE — Telephone Encounter (Signed)
Spoke with patient.

## 2015-07-14 NOTE — Progress Notes (Signed)
EPIC Encounter for ICM Monitoring  Patient Name: [name] [name] is a 59 y.o. male Date: 07/14/2015 Primary Care Physican: Deloria Lair, MD Primary Cardiologist: Domenic Polite Electrophysiologist: Allred Dry Weight: 175 lbs       In the past month, have you:  1. Gained more than 2 pounds in a day or more than 5 pounds in a week? no  2. Had changes in your medications (with verification of current medications)? no  3. Had more shortness of breath than is usual for you? no  4. Limited your activity because of shortness of breath? no  5. Not been able to sleep because of shortness of breath? no  6. Had increased swelling in your feet or ankles? no  7. Had symptoms of dehydration (dizziness, dry mouth, increased thirst, decreased urine output) no  8. Had changes in sodium restriction? no  9. Been compliant with medication? Yes   ICM trend:   Follow-up plan: ICM clinic phone appointment on 08/14/2015.  Optivol revealed impedance slightly below baseline ~06/03/2015 to 07/01/2015 and then trending back to baseline.  He denied any symptoms during this time.  He has had a 10lb weight loss since office visit with Dr Rayann Heman on 06/20/2015. He stated that is due to he does not wear his shoes and lighter clothes at home.  Education given to report any CHF symptoms and no matter what is his actual weight number, encouraged to monitor for the 2 lb loss overnight and 5 lbs in a week.    Copy of note sent to patient's primary care physician, primary cardiologist, and device following physician.  Rosalene Billings, RN, CCM 07/14/2015 9:05 AM

## 2015-08-11 ENCOUNTER — Telehealth: Payer: Self-pay

## 2015-08-11 NOTE — Telephone Encounter (Signed)
Received call from patient to verify the remote transmission date of 08/14/2015.

## 2015-08-14 ENCOUNTER — Ambulatory Visit (INDEPENDENT_AMBULATORY_CARE_PROVIDER_SITE_OTHER): Payer: Medicare Other

## 2015-08-14 DIAGNOSIS — I5022 Chronic systolic (congestive) heart failure: Secondary | ICD-10-CM | POA: Diagnosis not present

## 2015-08-14 DIAGNOSIS — Z95810 Presence of automatic (implantable) cardiac defibrillator: Secondary | ICD-10-CM | POA: Diagnosis not present

## 2015-08-15 NOTE — Progress Notes (Signed)
EPIC Encounter for ICM Monitoring  Patient Name: [name] [name] is a 59 y.o. male Date: 08/15/2015 Primary Care Physican: Deloria Lair, MD Primary Cardiologist: Domenic Polite  Electrophysiologist: Allred Dry Weight: 175 lb       In the past month, have you:  1. Gained more than 2 pounds in a day or more than 5 pounds in a week? no  2. Had changes in your medications (with verification of current medications)? no  3. Had more shortness of breath than is usual for you? no  4. Limited your activity because of shortness of breath? no  5. Not been able to sleep because of shortness of breath? no  6. Had increased swelling in your feet or ankles? no  7. Had symptoms of dehydration (dizziness, dry mouth, increased thirst, decreased urine output) no  8. Had changes in sodium restriction? no  9. Been compliant with medication? Yes   ICM trend:   Follow-up plan: ICM clinic phone appointment 09/22/2015.  Impedance trending along baseline.  Patient asymptomatic and feeling fine.  No changes today.   Copy of note sent to patient's primary care physician, primary cardiologist, and device following physician.  Rosalene Billings, RN, CCM 08/15/2015 10:20 AM

## 2015-09-22 ENCOUNTER — Ambulatory Visit (INDEPENDENT_AMBULATORY_CARE_PROVIDER_SITE_OTHER): Payer: Medicare Other | Admitting: *Deleted

## 2015-09-22 DIAGNOSIS — I429 Cardiomyopathy, unspecified: Secondary | ICD-10-CM | POA: Diagnosis not present

## 2015-09-22 DIAGNOSIS — I5022 Chronic systolic (congestive) heart failure: Secondary | ICD-10-CM | POA: Diagnosis not present

## 2015-09-22 DIAGNOSIS — Z95810 Presence of automatic (implantable) cardiac defibrillator: Secondary | ICD-10-CM

## 2015-09-22 DIAGNOSIS — I428 Other cardiomyopathies: Secondary | ICD-10-CM

## 2015-09-23 LAB — CUP PACEART REMOTE DEVICE CHECK
Brady Statistic RV Percent Paced: 6.28 %
Date Time Interrogation Session: 20161107083428
HIGH POWER IMPEDANCE MEASURED VALUE: 304 Ohm
HighPow Impedance: 51 Ohm
Implantable Lead Location: 753860
Implantable Lead Location: 753860
Implantable Lead Model: 6935
Implantable Lead Model: 6945
Lead Channel Impedance Value: 399 Ohm
Lead Channel Pacing Threshold Amplitude: 0.75 V
Lead Channel Pacing Threshold Pulse Width: 0.4 ms
Lead Channel Sensing Intrinsic Amplitude: 3.375 mV
MDC IDC LEAD IMPLANT DT: 20060502
MDC IDC LEAD IMPLANT DT: 20130529
MDC IDC MSMT BATTERY VOLTAGE: 3.14 V
MDC IDC SET LEADCHNL RV PACING AMPLITUDE: 2.5 V
MDC IDC SET LEADCHNL RV PACING PULSEWIDTH: 0.4 ms
MDC IDC SET LEADCHNL RV SENSING SENSITIVITY: 0.3 mV

## 2015-09-23 NOTE — Progress Notes (Signed)
Remote ICD transmission.   

## 2015-09-24 ENCOUNTER — Encounter: Payer: Self-pay | Admitting: Cardiology

## 2015-09-25 NOTE — Progress Notes (Addendum)
EPIC Encounter for ICM Monitoring  Patient Name: [name] [name] is a 59 y.o. male Date: 09/25/2015 Primary Care Physican: Deloria Lair, MD Primary Cardiologist: Domenic Polite Electrophysiologist: Allred Dry Weight: 275 lbs (weight entered incorrectly at previous ICM encounter)       In the past month, have you:  1. Gained more than 2 pounds in a day or more than 5 pounds in a week? no  2. Had changes in your medications (with verification of current medications)? no  3. Had more shortness of breath than is usual for you? no  4. Limited your activity because of shortness of breath? no  5. Not been able to sleep because of shortness of breath? no  6. Had increased swelling in your feet or ankles? no  7. Had symptoms of dehydration (dizziness, dry mouth, increased thirst, decreased urine output) no  8. Had changes in sodium restriction? no  9. Been compliant with medication? Yes   ICM trend:  09/22/2015   Follow-up plan: ICM clinic phone appointment on 10/27/2015.  Optivol thoracic impedance trending along baseline.  He reported he is doing well.  No changes today.   Copy of note sent to patient's primary care physician, primary cardiologist, and device following physician.  Rosalene Billings, RN, CCM 09/25/2015 10:18 AM

## 2015-10-27 ENCOUNTER — Ambulatory Visit (INDEPENDENT_AMBULATORY_CARE_PROVIDER_SITE_OTHER): Payer: Medicare Other

## 2015-10-27 ENCOUNTER — Telehealth: Payer: Self-pay | Admitting: Cardiology

## 2015-10-27 DIAGNOSIS — I5022 Chronic systolic (congestive) heart failure: Secondary | ICD-10-CM | POA: Diagnosis not present

## 2015-10-27 DIAGNOSIS — Z95810 Presence of automatic (implantable) cardiac defibrillator: Secondary | ICD-10-CM

## 2015-10-27 NOTE — Telephone Encounter (Signed)
Spoke with pt and reminded pt of remote transmission that is due today. Pt verbalized understanding.   

## 2015-10-27 NOTE — Progress Notes (Signed)
EPIC Encounter for ICM Monitoring  Patient Name: [name] [name] is a 59 y.o. male Date: 10/27/2015 Primary Care 2Physican: Deloria Lair, MD Primary Cardiologist: Domenic Polite Electrophysiologist: Allred Dry Weight: 275 lbs      In the past month, have you:  1. Gained more than 2 pounds in a day or more than 5 pounds in a week? no  2. Had changes in your medications (with verification of current medications)? no  3. Had more shortness of breath than is usual for you? no  4. Limited your activity because of shortness of breath? no  5. Not been able to sleep because of shortness of breath? no  6. Had increased swelling in your feet or ankles? no  7. Had symptoms of dehydration (dizziness, dry mouth, increased thirst, decreased urine output) no  8. Had changes in sodium restriction? no  9. Been compliant with medication? Yes   ICM trend:   Follow-up plan: ICM clinic phone appointment on 11/27/2015.  Optivol thoracic impedance below baseline 10/23/2015 to 10/26/2015 suggesting fluid retention.  He reported he ate at a Christmas dinner this weekend and the foods he ate were higher in sodium.  Advised to return to low sodium diet, limiting to 2000 mg day. Impedance starting to trend back up toward baseline 11/26/2014.  He denied any HF symptoms today.  Reviewed HF symptoms to report.  Reminded him of appointment with Dr Domenic Polite on 10/31/2015.  No changes today.   Low Sodium Eating Plan mailed to patient.   Copy of note sent to patient's primary care physician, primary cardiologist, and device following physician.  Rosalene Billings, RN, CCM 10/27/2015 3:48 PM

## 2015-10-31 ENCOUNTER — Encounter: Payer: Self-pay | Admitting: Cardiology

## 2015-10-31 ENCOUNTER — Ambulatory Visit (INDEPENDENT_AMBULATORY_CARE_PROVIDER_SITE_OTHER): Payer: Medicare Other | Admitting: Cardiology

## 2015-10-31 VITALS — BP 138/86 | HR 94 | Ht 77.0 in | Wt 286.0 lb

## 2015-10-31 DIAGNOSIS — I428 Other cardiomyopathies: Secondary | ICD-10-CM

## 2015-10-31 DIAGNOSIS — I10 Essential (primary) hypertension: Secondary | ICD-10-CM

## 2015-10-31 DIAGNOSIS — I429 Cardiomyopathy, unspecified: Secondary | ICD-10-CM | POA: Diagnosis not present

## 2015-10-31 DIAGNOSIS — I5022 Chronic systolic (congestive) heart failure: Secondary | ICD-10-CM | POA: Diagnosis not present

## 2015-10-31 NOTE — Progress Notes (Signed)
Cardiology Office Note  Date: 10/31/2015   ID: [name] [name], DOB [birth year], MRN [id number]  PCP: Deloria Lair, MD  Primary Cardiologist: Rozann Lesches, MD   Chief Complaint  Patient presents with  . Cardiomyopathy  . Atrial Fibrillation    History of Present Illness: [name] [name] is a 59 y.o. male last seen in June. He presents for a routine follow-up visit. Since last evaluation he does not report any decline in functional capacity. He describes NYHA class II dyspnea, no palpitations or device discharges.  He continues on Coumadin, followed by Dr. Scotty Court. He denies any spontaneous bleeding problems.  Device follow-up continues with Dr. Rayann Heman. Optivol has been stable. Weight is only up 1 pound in the last 6 months.  We reviewed his medications. Cardiac regimen includes Coreg, Cardizem CD, Lasix, Imdur, lisinopril, Pravachol, potassium supplements and Coumadin.  Past Medical History  Diagnosis Date  . Cardiomyopathy, nonischemic (HCC)     LVEF 35-40%  . Essential hypertension, benign   . Atrial fibrillation ([place])   . Cardiac arrest ([place]) 1998    ICD implanted at [facility]  . Chronic systolic heart failure ([place])   . Pneumonia     Severe with respiratory failure 10/13  . Anemia   . AVM (arteriovenous malformation)     Duodenum - nonbleeding and EGD 11/13  . History of colonic polyps     Colonoscopy 11/13    Current Outpatient Prescriptions  Medication Sig Dispense Refill  . carvedilol (COREG) 12.5 MG tablet Take 12.5 mg by mouth 2 (two) times daily with a meal.    . Cholecalciferol (VITAMIN D3) 2000 UNITS capsule Take 2,000 Units by mouth daily.      . cyclobenzaprine (FLEXERIL) 10 MG tablet Take 1 tablet (10 mg total) by mouth 3 (three) times daily as needed. 30 tablet 0  . diltiazem (CARDIZEM CD) 240 MG 24 hr capsule Take 1 capsule (240 mg total) by mouth daily.    . finasteride (PROSCAR) 5 MG tablet Take 5 mg by mouth daily.     . furosemide (LASIX) 80  MG tablet Take 1 tablet (80 mg total) by mouth daily. 90 tablet 3  . HYDROcodone-acetaminophen (NORCO/VICODIN) 5-325 MG per tablet Take one-two tabs po q 4-6 hrs prn pain 20 tablet 0  . isosorbide mononitrate (IMDUR) 30 MG 24 hr tablet Take 1 tablet (30 mg total) by mouth daily.    Marland Kitchen lisinopril (PRINIVIL,ZESTRIL) 10 MG tablet Take 0.5 tablets (5 mg total) by mouth daily. 90 tablet 3  . lisinopril (PRINIVIL,ZESTRIL) 5 MG tablet Take 5 mg by mouth daily.    . polyethylene glycol powder (GLYCOLAX/MIRALAX) powder Take 17 g by mouth daily as needed. For constipation - mix with 8 oz liquid and drink    . potassium chloride (MICRO-K) 10 MEQ CR capsule Take 1 capsule (10 mEq total) by mouth daily. 90 capsule 3  . pravastatin (PRAVACHOL) 40 MG tablet Take 40 mg by mouth at bedtime.     . tamsulosin (FLOMAX) 0.4 MG CAPS capsule Take 0.4 mg by mouth daily.    Marland Kitchen warfarin (COUMADIN) 2 MG tablet Take 2 mg by mouth daily.    Marland Kitchen warfarin (COUMADIN) 5 MG tablet Take 5 mg by mouth daily. Taking with 3mg  for a total of 8mg      No current facility-administered medications for this visit.   Allergies:  Review of patient's allergies indicates no known allergies.   Social History: The patient  reports that he quit  smoking about 18 years ago. His smoking use included Cigarettes. He started smoking about 37 years ago. He has a 4.8 pack-year smoking history. He has never used smokeless tobacco. He reports that he uses illicit drugs (Marijuana). He reports that he does not drink alcohol.   ROS:  Please see the history of present illness. Otherwise, complete review of systems is positive for none.  All other systems are reviewed and negative.   Physical Exam: VS:  BP 138/86 mmHg  Pulse 94  Ht 6\' 5"  (1.956 m)  Wt 286 lb (129.729 kg)  BMI 33.91 kg/m2  SpO2 96%, BMI Body mass index is 33.91 kg/(m^2).  Wt Readings from Last 3 Encounters:  10/31/15 286 lb (129.729 kg)  06/20/15 285 lb (129.275 kg)  04/24/15 281 lb  (127.461 kg)    Comfortable. Tall stature. Overweight.  HEENT: Conjunctiva and lids normal, oropharynx clear.  Neck: Supple, no elevated JVP or carotid bruits, no thyromegaly.  Lungs: Course but clear to auscultation, nonlabored breathing at rest.  Cardiac: Irregularly irregular, no S3 or significant systolic murmur, indistinct PMI.  Abdomen: Soft, nontender, bowel sounds present.  Extremities: No pitting edema, distal pulses 1-2+.    ECG: Tracing from 04/24/2015 showed rate-controlled atrial fibrillation.  Recent Labwork:  07/26/2014: BUN 14, creatinine 1.0, AST 22, ALT 20, hemoglobin A1c 5.6, cholesterol 83, HDL 32, LDL 41, triglycerides 51  Other Studies Reviewed Today:  Echocardiogram 10/31/2014: Study Conclusions  - Left ventricle: The cavity size was normal. Wall thickness was increased in a pattern of mild LVH. Systolic function was severely reduced. The estimated ejection fraction was in the range of 25% to 30%. - Aortic valve: Mildly calcified annulus. Trileaflet; mildly thickened leaflets. Valve area (VTI): 3.32 cm^2. Valve area (Vmax): 3.07 cm^2. Valve area (Vmean): 2.68 cm^2. - Mitral valve: Mildly to moderately calcified annulus. Mildly thickened leaflets . - Left atrium: The atrium was severely dilated. - Right ventricle: The cavity size was mildly to moderately dilated. Systolic function was normal. RV TAPSE is 2.0 cm. - Right atrium: The atrium was severely dilated. - Tricuspid valve: There is at least moderate eccentric TR. Severity may be underestimated by the eccentricity of the jet. The pacemaker lead likely affects closure of the septal TV leaflet. There was moderate regurgitation. - Pulmonary arteries: Systolic pressure was moderately increased. PA peak pressure: 52 mm Hg (S). - Systemic veins: The IVC is dilated with normal respiratory collapse, estimated RA pressure is 8 mmHg. - Pericardium, extracardiac: There is a small  circumferential pericardial effusion. There is no tamponade physiology by echo. - Technically difficult study.  Assessment and Plan:  1. Nonischemic cardiomyopathy with LVEF 25-30% and chronic systolic heart failure. He is symptomatically stable, no significant weight gain. Plan to continue medical therapy and observation. Discussed walking regimen for exercise. Also sodium restriction.  2. 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 Rayann Heman, also undergoes remote device transmissions. No recent palpitations or device discharges.  3. Essential hypertension, adequate blood pressure control today.  Current medicines were reviewed with the patient today.  Disposition: FU with me in 6 months.   Signed, Satira Sark, MD, [facility] 10/31/2015 9:08 AM    [facility] [address], [place], [place] [address] Phone: [phone]; Fax: [phone]

## 2015-10-31 NOTE — Patient Instructions (Signed)
Your physician recommends that you continue on your current medications as directed. Please refer to the Current Medication list given to you today. Your physician recommends that you schedule a follow-up appointment in: 6 months. You will receive a reminder letter in the mail in about 4 months reminding you to call and schedule your appointment. If you don't receive this letter, please contact our office. 

## 2015-11-24 ENCOUNTER — Other Ambulatory Visit: Payer: Self-pay | Admitting: Cardiology

## 2015-11-24 MED ORDER — FUROSEMIDE 80 MG PO TABS: 80.0000 mg | ORAL_TABLET | Freq: Every day | ORAL | Status: DC

## 2015-11-24 NOTE — Telephone Encounter (Signed)
Medication sent to pharmacy [facility]

## 2015-11-24 NOTE — Telephone Encounter (Signed)
[name] [name] needs refill on Lasix 80 mg -States that he has changed to [place] , [place], [place]

## 2015-11-27 ENCOUNTER — Ambulatory Visit (INDEPENDENT_AMBULATORY_CARE_PROVIDER_SITE_OTHER): Payer: Medicare HMO

## 2015-11-27 DIAGNOSIS — I5022 Chronic systolic (congestive) heart failure: Secondary | ICD-10-CM | POA: Diagnosis not present

## 2015-11-27 DIAGNOSIS — Z95810 Presence of automatic (implantable) cardiac defibrillator: Secondary | ICD-10-CM | POA: Diagnosis not present

## 2015-11-27 NOTE — Progress Notes (Signed)
EPIC Encounter for ICM Monitoring  Patient Name: [name] [name] is a 60 y.o. male Date: 11/27/2015 Primary Care Physican: Deloria Lair, MD Primary Cardiologist: Domenic Polite Electrophysiologist: Allred  Dry Weight: 275 lbs       In the past month, have you:  1. Gained more than 2 pounds in a day or more than 5 pounds in a week? no  2. Had changes in your medications (with verification of current medications)? no  3. Had more shortness of breath than is usual for you? no  4. Limited your activity because of shortness of breath? no  5. Not been able to sleep because of shortness of breath? no  6. Had increased swelling in your feet or ankles? no  7. Had symptoms of dehydration (dizziness, dry mouth, increased thirst, decreased urine output) no  8. Had changes in sodium restriction? no  9. Been compliant with medication? Yes   ICM trend: 3 month view 11/27/2015  ICM trend: 1 year view 11/27/2015   Follow-up plan: ICM clinic phone appointment 12/31/2015.   Optivol impedance below reference line 11/22/2015 to 11/24/2015 and trending to baseline 11/25/2015.  Patient report he ate fried chicken over the weekend and eats out at restaurants.  Education given restaurant foods are high in sodium and try to pick healthy choices.   Encouraged him to follow low sodium diet since salt will increase fluid retention.  No changes today.    Copy of note sent to patient's primary care physician, primary cardiologist, and device following physician.  Rosalene Billings, RN, CCM 11/27/2015 4:08 PM

## 2015-12-31 ENCOUNTER — Ambulatory Visit (INDEPENDENT_AMBULATORY_CARE_PROVIDER_SITE_OTHER): Payer: Medicare HMO | Admitting: *Deleted

## 2015-12-31 DIAGNOSIS — Z95810 Presence of automatic (implantable) cardiac defibrillator: Secondary | ICD-10-CM | POA: Diagnosis not present

## 2015-12-31 DIAGNOSIS — I429 Cardiomyopathy, unspecified: Secondary | ICD-10-CM

## 2015-12-31 DIAGNOSIS — I428 Other cardiomyopathies: Secondary | ICD-10-CM

## 2015-12-31 NOTE — Progress Notes (Signed)
Remote ICD transmission.   

## 2016-01-02 NOTE — Progress Notes (Signed)
EPIC Encounter for ICM Monitoring  Patient Name: [name] [name] is a 60 y.o. male Date: 01/02/2016 Primary Care Physican: Deloria Lair, MD Primary Cardiologist: Domenic Polite Electrophysiologist: Allred Dry Weight: 275 lbs       In the past month, have you:  1. Gained more than 2 pounds in a day or more than 5 pounds in a week? no  2. Had changes in your medications (with verification of current medications)? no  3. Had more shortness of breath than is usual for you? no  4. Limited your activity because of shortness of breath? no  5. Not been able to sleep because of shortness of breath? no  6. Had increased swelling in your feet or ankles? no  7. Had symptoms of dehydration (dizziness, dry mouth, increased thirst, decreased urine output) no  8. Had changes in sodium restriction? no  9. Been compliant with medication? Yes   ICM trend: 3 month view    ICM trend: 1 year view   Follow-up plan: ICM clinic phone appointment on 02/10/2016.  Optivol thoracic impedance below reference line 12/09/2015 to 12/12/2015, 12/17/2015 to 12/21/2015, 12/23/2015 to 12/27/2015 suggesting fluid accumulation.  He denied any symptoms and thinks it is related to foods he is eating.  Reminded him the importance of following low salt diet which can cause fluid accumulation.   He stated he is doing fine at this time.  No changes today.    Copy of note sent to patient's primary care physician, primary cardiologist, and device following physician.  Rosalene Billings, RN, CCM 01/02/2016 1:05 PM

## 2016-01-05 LAB — CUP PACEART REMOTE DEVICE CHECK
Battery Voltage: 3.13 V
Brady Statistic RV Percent Paced: 5.73 %
Date Time Interrogation Session: 20170215083428
HIGH POWER IMPEDANCE MEASURED VALUE: 342 Ohm
HIGH POWER IMPEDANCE MEASURED VALUE: 55 Ohm
Implantable Lead Implant Date: 20130529
Implantable Lead Location: 753860
Implantable Lead Location: 753860
Implantable Lead Model: 6935
Implantable Lead Model: 6945
Lead Channel Impedance Value: 418 Ohm
Lead Channel Pacing Threshold Amplitude: 0.875 V
Lead Channel Sensing Intrinsic Amplitude: 4 mV
Lead Channel Setting Pacing Amplitude: 2.5 V
Lead Channel Setting Pacing Pulse Width: 0.4 ms
Lead Channel Setting Sensing Sensitivity: 0.3 mV
MDC IDC LEAD IMPLANT DT: 20060502
MDC IDC MSMT LEADCHNL RV PACING THRESHOLD PULSEWIDTH: 0.4 ms
MDC IDC MSMT LEADCHNL RV SENSING INTR AMPL: 4 mV

## 2016-01-09 ENCOUNTER — Encounter: Payer: Self-pay | Admitting: Cardiology

## 2016-02-10 ENCOUNTER — Ambulatory Visit (INDEPENDENT_AMBULATORY_CARE_PROVIDER_SITE_OTHER): Payer: Medicare HMO

## 2016-02-10 DIAGNOSIS — I5022 Chronic systolic (congestive) heart failure: Secondary | ICD-10-CM

## 2016-02-10 DIAGNOSIS — Z95810 Presence of automatic (implantable) cardiac defibrillator: Secondary | ICD-10-CM

## 2016-02-10 NOTE — Progress Notes (Signed)
EPIC Encounter for ICM Monitoring  Patient Name: [name] [name] is a 60 y.o. male Date: 02/10/2016 Primary Care Physican: Deloria Lair, MD Primary Cardiologist: Domenic Polite Electrophysiologist: Allred Dry Weight: 251 lb   In the past month, have you:  1. Gained more than 2 pounds in a day or more than 5 pounds in a week? no  2. Had changes in your medications (with verification of current medications)? no  3. Had more shortness of breath than is usual for you? no  4. Limited your activity because of shortness of breath? no  5. Not been able to sleep because of shortness of breath? no  6. Had increased swelling in your feet or ankles? no  7. Had symptoms of dehydration (dizziness, dry mouth, increased thirst, decreased urine output) no  8. Had changes in sodium restriction? no  9. Been compliant with medication? Yes   ICM trend: 3 month view for 02/10/2016   ICM trend: 1 year view for 02/10/2016   Follow-up plan: ICM clinic phone appointment on 03/12/2016.  Thoracic impedance trending along reference line suggesting stable fluid levels.  Patient denied any symptoms.   Reminder to limit sodium intake to < 2000 mg and fluid intake to 64 oz daily.  Encouraged to call for any fluid symptoms.  No changes today.    Rosalene Billings, RN, CCM 02/10/2016 1:40 PM

## 2016-03-12 ENCOUNTER — Ambulatory Visit (INDEPENDENT_AMBULATORY_CARE_PROVIDER_SITE_OTHER): Payer: Medicare HMO

## 2016-03-12 DIAGNOSIS — I5022 Chronic systolic (congestive) heart failure: Secondary | ICD-10-CM

## 2016-03-12 DIAGNOSIS — Z95810 Presence of automatic (implantable) cardiac defibrillator: Secondary | ICD-10-CM | POA: Diagnosis not present

## 2016-03-12 NOTE — Progress Notes (Signed)
EPIC Encounter for ICM Monitoring  Patient Name: [name] [name] is a 60 y.o. male Date: 03/12/2016 Primary Care Physican: Deloria Lair, MD Primary Cardiologist: Domenic Polite Electrophysiologist: Allred Dry Weight: 255 lb     In the past month, have you:  1. Gained more than 2 pounds in a day or more than 5 pounds in a week? no  2. Had changes in your medications (with verification of current medications)? no  3. Had more shortness of breath than is usual for you? no  4. Limited your activity because of shortness of breath? no  5. Not been able to sleep because of shortness of breath? no  6. Had increased swelling in your feet or ankles? no  7. Had symptoms of dehydration (dizziness, dry mouth, increased thirst, decreased urine output) no  8. Had changes in sodium restriction? no  9. Been compliant with medication? Yes  ICM trend: 3 month view for 03/12/2016  ICM trend: 1 year view for 03/12/2016  Follow-up plan: ICM clinic phone appointment 04/14/2016.    FLUID LEVELS: Optivol thoracic impedance decreased 02/09/2016 to 03/07/2016 (with exception of 2 days at baseline) suggesting fluid accumulation and returned to baseline 03/08/2016.   SYMPTOMS: None.  Reviewed fluid symptoms to report and encouraged to call if any develop.  EDUCATION:  Patient reported eating lot of restaurant foods for example [employer].  Reviewed one piece of [employer] chicken has 1100mg  per piece and he should limit sodium intake to < 2000 mg and fluid intake to 64 oz daily.   He stated he knows that he needs to change his diet in order to be healthier but he does like to eat out several times a week.  Encouraged him to decrease the amount of times he eats out in a week.       Rosalene Billings, RN, CCM 03/12/2016 1:43 PM

## 2016-04-13 ENCOUNTER — Telehealth: Payer: Self-pay | Admitting: Internal Medicine

## 2016-04-13 NOTE — Telephone Encounter (Signed)
Pt  has questions re transmission scheduled today -pls call

## 2016-04-13 NOTE — Telephone Encounter (Signed)
Informed pt that his remote transmission is scheduled for tomorrow 04-14-2016.

## 2016-04-14 ENCOUNTER — Ambulatory Visit (INDEPENDENT_AMBULATORY_CARE_PROVIDER_SITE_OTHER): Payer: Medicare HMO | Admitting: *Deleted

## 2016-04-14 DIAGNOSIS — I5022 Chronic systolic (congestive) heart failure: Secondary | ICD-10-CM | POA: Diagnosis not present

## 2016-04-14 DIAGNOSIS — I429 Cardiomyopathy, unspecified: Secondary | ICD-10-CM

## 2016-04-14 DIAGNOSIS — Z95810 Presence of automatic (implantable) cardiac defibrillator: Secondary | ICD-10-CM

## 2016-04-14 DIAGNOSIS — I428 Other cardiomyopathies: Secondary | ICD-10-CM

## 2016-04-14 NOTE — Progress Notes (Signed)
EPIC Encounter for ICM Monitoring  Patient Name: [name] [name] is a 60 y.o. male Date: 04/14/2016 Primary Care Physican: Deloria Lair, MD Primary Cardiologist: Domenic Polite Electrophysiologist: Allred Dry Weight: 255 lbs       In the past month, have you:  1. Gained more than 2 pounds in a day or more than 5 pounds in a week? no  2. Had changes in your medications (with verification of current medications)? no  3. Had more shortness of breath than is usual for you? no  4. Limited your activity because of shortness of breath? no  5. Not been able to sleep because of shortness of breath? no  6. Had increased swelling in your feet, ankles, legs or stomach area? no  7. Had symptoms of dehydration (dizziness, dry mouth, increased thirst, decreased urine output) no  8. Had changes in sodium restriction? Does not adhere to low salt diet  9. Been compliant with medication? Yes  ICM trend: 3 month view for 04/14/2016   ICM trend: 1 year view for 04/14/2016   Follow-up plan: ICM clinic phone appointment 05/20/2016.  Office appointment with Dr Domenic Polite 04/22/2016.    FLUID LEVELS:  Optivol thoracic impedance decreased 04/04/2016 to 04/09/2016 suggesting fluid accumulation and returned to baseline 04/09/2016.    SYMPTOMS:   None.  Reviewed fluid symptoms such as weight gain of 3 pounds overnight or 5 pounds within a week, SOB and/or lower extremity swelling. Encouraged to call for any fluid symptoms.   EDUCATION:  Discussed typical foods he eats and the sodium content.  Advised to read labels and add total amount of daily salt intake and decrease to 2000 mg.  Patient eats a lot of pizza, process deli meats, eating out and unaware of sodium amount.  Discussed how to read a label.  Encouraged choose foods lower in sodium and to limit fluid intake to 64 oz daily.     RECOMMENDATIONS: No changes today.    Copy sent to Dr Domenic Polite for review prior to office appointment if needed.    Rosalene Billings, RN, CCM 04/14/2016 2:40 PM

## 2016-04-22 ENCOUNTER — Ambulatory Visit (INDEPENDENT_AMBULATORY_CARE_PROVIDER_SITE_OTHER): Payer: Medicare HMO | Admitting: Cardiology

## 2016-04-22 ENCOUNTER — Encounter: Payer: Self-pay | Admitting: Cardiology

## 2016-04-22 VITALS — BP 120/78 | HR 73 | Ht 77.0 in | Wt 275.0 lb

## 2016-04-22 DIAGNOSIS — I482 Chronic atrial fibrillation, unspecified: Secondary | ICD-10-CM

## 2016-04-22 DIAGNOSIS — I429 Cardiomyopathy, unspecified: Secondary | ICD-10-CM

## 2016-04-22 DIAGNOSIS — I5022 Chronic systolic (congestive) heart failure: Secondary | ICD-10-CM | POA: Diagnosis not present

## 2016-04-22 DIAGNOSIS — I428 Other cardiomyopathies: Secondary | ICD-10-CM

## 2016-04-22 DIAGNOSIS — Z95810 Presence of automatic (implantable) cardiac defibrillator: Secondary | ICD-10-CM

## 2016-04-22 DIAGNOSIS — I10 Essential (primary) hypertension: Secondary | ICD-10-CM

## 2016-04-22 NOTE — Patient Instructions (Signed)
Your physician recommends that you continue on your current medications as directed. Please refer to the Current Medication list given to you today. Your physician has requested that you have an echocardiogram in 6 months just before your next visit. Echocardiography is a painless test that uses sound waves to create images of your heart. It provides your doctor with information about the size and shape of your heart and how well your heart's chambers and valves are working. This procedure takes approximately one hour. There are no restrictions for this procedure. Your physician recommends that you schedule a follow-up appointment in: 6 months. You will receive a reminder letter in the mail in about 4 months reminding you to call and schedule your appointment. If you don't receive this letter, please contact our office.

## 2016-04-22 NOTE — Progress Notes (Signed)
Cardiology Office Note  Date: 04/22/2016   ID: [name], DOB 11-18-1955, MRN [id number]  PCP: Deloria Lair, MD  Primary Cardiologist: Rozann Lesches, MD   Chief Complaint  Patient presents with  . Cardiomyopathy  . Atrial Fibrillation    History of Present Illness: [name] [name] is a 60 y.o. male last seen in December 2016. He presents for a routine follow-up visit. Reports NYHA class II dyspnea, no palpitations or device shocks. Has been doing some walking for exercise, but no regular plan. We did discuss this today.  He continues on Coumadin, followed by Dr. Scotty Court. I reviewed his most recent lab work from January. No reported bleeding episodes.  He continues to follow in the device clinic with Dr. Rayann Heman, Medtronic ICD in place. Most recent thoracic impedance measurements noted. His weight is actually down 10 pounds compared to the last visit. I reviewed his ECG today which shows rate-controlled atrial fibrillation with PVC versus aberrantly conducted complex.  We went over his medications. Cardiac regimen includes Coreg, Cardizem CD, Lasix, Imdur, lisinopril, potassium supplements, Pravachol, and Coumadin. He has required both beta blocker and calcium channel blocker for heart rate control of atrial fibrillation.  Echocardiogram from 2015 reviewed below.  Past Medical History  Diagnosis Date  . Cardiomyopathy, nonischemic (HCC)     LVEF 35-40%  . Essential hypertension, benign   . Atrial fibrillation ([place])   . Cardiac arrest ([place]) 1998    ICD implanted at [facility]  . Chronic systolic heart failure ([place])   . Pneumonia     Severe with respiratory failure 10/13  . Anemia   . AVM (arteriovenous malformation)     Duodenum - nonbleeding and EGD 11/13  . History of colonic polyps     Colonoscopy 11/13    Past Surgical History  Procedure Laterality Date  . Cardiac defibrillator placement  1998  . Defibrillator system revision  04/12/12    MDT Protecta XT VR  implanted at [facility] by Dr Deno Etienne with previously implanted system and leads extracted due to RV lead failure  . Esophagogastroduodenoscopy  09/25/2012    Procedure: ESOPHAGOGASTRODUODENOSCOPY (EGD);  Surgeon: Beryle Beams, MD;  Location: [facility] ENDOSCOPY;  Service: Endoscopy;  Laterality: N/A;  . Colonoscopy  09/26/2012    Procedure: COLONOSCOPY;  Surgeon: Beryle Beams, MD;  Location: [place];  Service: Endoscopy;  Laterality: N/A;    Current Outpatient Prescriptions  Medication Sig Dispense Refill  . carvedilol (COREG) 12.5 MG tablet Take 12.5 mg by mouth 2 (two) times daily with a meal.    . Cholecalciferol (VITAMIN D3) 2000 UNITS capsule Take 2,000 Units by mouth daily.      . cyclobenzaprine (FLEXERIL) 10 MG tablet Take 1 tablet (10 mg total) by mouth 3 (three) times daily as needed. 30 tablet 0  . diltiazem (CARDIZEM CD) 240 MG 24 hr capsule Take 1 capsule (240 mg total) by mouth daily.    . finasteride (PROSCAR) 5 MG tablet Take 5 mg by mouth daily.     . furosemide (LASIX) 80 MG tablet Take 1 tablet (80 mg total) by mouth daily. 90 tablet 3  . HYDROcodone-acetaminophen (NORCO/VICODIN) 5-325 MG per tablet Take one-two tabs po q 4-6 hrs prn pain 20 tablet 0  . isosorbide mononitrate (IMDUR) 30 MG 24 hr tablet Take 1 tablet (30 mg total) by mouth daily.    Marland Kitchen lisinopril (PRINIVIL,ZESTRIL) 10 MG tablet Take 0.5 tablets (5 mg total) by mouth daily. 90 tablet 3  .  lisinopril (PRINIVIL,ZESTRIL) 5 MG tablet Take 5 mg by mouth daily.    . polyethylene glycol powder (GLYCOLAX/MIRALAX) powder Take 17 g by mouth daily as needed. For constipation - mix with 8 oz liquid and drink    . potassium chloride (MICRO-K) 10 MEQ CR capsule Take 1 capsule (10 mEq total) by mouth daily. 90 capsule 3  . pravastatin (PRAVACHOL) 40 MG tablet Take 40 mg by mouth at bedtime.     . tamsulosin (FLOMAX) 0.4 MG CAPS capsule Take 0.4 mg by mouth daily.    Marland Kitchen warfarin (COUMADIN) 2 MG tablet Take 2 mg by mouth daily.     Marland Kitchen warfarin (COUMADIN) 5 MG tablet Take 5 mg by mouth daily. Taking with 3mg  for a total of 8mg      No current facility-administered medications for this visit.   Allergies:  Review of patient's allergies indicates no known allergies.   Social History: The patient  reports that he quit smoking about 19 years ago. His smoking use included Cigarettes. He started smoking about 37 years ago. He has a 4.8 pack-year smoking history. He has never used smokeless tobacco. He reports that he uses illicit drugs (Marijuana). He reports that he does not drink alcohol.   ROS:  Please see the history of present illness. Otherwise, complete review of systems is positive for back pain.  All other systems are reviewed and negative.   Physical Exam: VS:  BP 120/78 mmHg  Pulse 73  Ht 6\' 5"  (1.956 m)  Wt 275 lb (124.739 kg)  BMI 32.60 kg/m2  SpO2 98%, BMI Body mass index is 32.6 kg/(m^2).  Wt Readings from Last 3 Encounters:  04/22/16 275 lb (124.739 kg)  10/31/15 286 lb (129.729 kg)  06/20/15 285 lb (129.275 kg)    Comfortable. Tall stature. Obese.  HEENT: Conjunctiva and lids normal, oropharynx clear.  Neck: Supple, no elevated JVP or carotid bruits, no thyromegaly.  Lungs: Course but clear to auscultation, nonlabored breathing at rest.  Cardiac: Irregularly irregular, no S3 or significant systolic murmur, indistinct PMI.  Abdomen: Soft, nontender, bowel sounds present.  Extremities: No pitting edema, distal pulses 1-2+.  Skin: Warm and dry. Musculoskeletal: No kyphosis. Neuropsychiatric: Alert and oriented 3, affect appropriate.  ECG: I personally reviewed the tracing from 04/24/2015 which showed rate-controlled atrial fibrillation.  Recent Labwork:  January 2017: TSH 1.88, cholesterol 99, triglycerides 66, HDL 30, LDL 56, BUN 10, creatinine 0.81, AST 14, ALT 10, potassium 4.3, hemoglobin 14.4, platelets 219  Other Studies Reviewed Today:  Echocardiogram 10/31/2014: Study  Conclusions  - Left ventricle: The cavity size was normal. Wall thickness was increased in a pattern of mild LVH. Systolic function was severely reduced. The estimated ejection fraction was in the range of 25% to 30%. - Aortic valve: Mildly calcified annulus. Trileaflet; mildly thickened leaflets. Valve area (VTI): 3.32 cm^2. Valve area (Vmax): 3.07 cm^2. Valve area (Vmean): 2.68 cm^2. - Mitral valve: Mildly to moderately calcified annulus. Mildly thickened leaflets . - Left atrium: The atrium was severely dilated. - Right ventricle: The cavity size was mildly to moderately dilated. Systolic function was normal. RV TAPSE is 2.0 cm. - Right atrium: The atrium was severely dilated. - Tricuspid valve: There is at least moderate eccentric TR. Severity may be underestimated by the eccentricity of the jet. The pacemaker lead likely affects closure of the septal TV leaflet. There was moderate regurgitation. - Pulmonary arteries: Systolic pressure was moderately increased. PA peak pressure: 52 mm Hg (S). - Systemic veins: The  IVC is dilated with normal respiratory collapse, estimated RA pressure is 8 mmHg. - Pericardium, extracardiac: There is a small circumferential pericardial effusion. There is no tamponade physiology by echo. - Technically difficult study.  Assessment and Plan:  1. Nonischemic cardiomyopathy with LVEF approximately 30% by last assessment in 2015. Plan to continue current medical regimen. Recommended regular exercise plan. Discussed fluid and salt restriction guidelines. We will follow-up with repeat echocardiogram around the time of his next visit.  2. Chronic atrial fibrillation, continues on Coumadin with strategy of heart rate control.  3. Essential hypertension, blood pressure is well controlled today.  4. 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 Continues to follow with the device clinic with Dr. Rayann Heman. No recent  device shocks.  Current medicines were reviewed with the patient today.   Orders Placed This Encounter  Procedures  . EKG 12-Lead  . ECHOCARDIOGRAM COMPLETE    Disposition: FU with me in 6 months.   Signed, Satira Sark, MD, [facility] 04/22/2016 8:44 AM    [facility] [address], [place], [place] [address] Phone: [phone]; Fax: [phone]

## 2016-04-29 LAB — CUP PACEART REMOTE DEVICE CHECK
Battery Voltage: 3.13 V
Brady Statistic RV Percent Paced: 5.06 %
Date Time Interrogation Session: 20170531073627
HIGH POWER IMPEDANCE MEASURED VALUE: 342 Ohm
HIGH POWER IMPEDANCE MEASURED VALUE: 58 Ohm
Implantable Lead Implant Date: 20060502
Implantable Lead Implant Date: 20130529
Implantable Lead Location: 753860
Lead Channel Impedance Value: 399 Ohm
Lead Channel Pacing Threshold Pulse Width: 0.4 ms
Lead Channel Sensing Intrinsic Amplitude: 3 mV
Lead Channel Sensing Intrinsic Amplitude: 3 mV
Lead Channel Setting Pacing Amplitude: 2.5 V
Lead Channel Setting Sensing Sensitivity: 0.3 mV
MDC IDC LEAD LOCATION: 753860
MDC IDC LEAD MODEL: 6935
MDC IDC MSMT LEADCHNL RV PACING THRESHOLD AMPLITUDE: 0.875 V
MDC IDC SET LEADCHNL RV PACING PULSEWIDTH: 0.4 ms

## 2016-05-05 ENCOUNTER — Encounter: Payer: Self-pay | Admitting: Cardiology

## 2016-05-20 ENCOUNTER — Ambulatory Visit (INDEPENDENT_AMBULATORY_CARE_PROVIDER_SITE_OTHER): Payer: Medicare HMO

## 2016-05-20 DIAGNOSIS — Z95810 Presence of automatic (implantable) cardiac defibrillator: Secondary | ICD-10-CM

## 2016-05-20 DIAGNOSIS — I5022 Chronic systolic (congestive) heart failure: Secondary | ICD-10-CM | POA: Diagnosis not present

## 2016-05-20 NOTE — Progress Notes (Addendum)
EPIC Encounter for ICM Monitoring  Patient Name: [name] [name] is a 61 y.o. male Date: 05/20/2016 Primary Care Physican: Deloria Lair, MD Primary Cardiologist: Domenic Polite Electrophysiologist: Allred Dry Weight: 270 lb        Heart Failure questions reviewed, pt asymptomatic.  Patient gave verbal permission to leave detailed message on home phone, [phone].   Thoracic impedence below reference line since 05/13/2016.  He reported eating foods that were higher in sodium and drinking a lot of water.  Low sodium diet education provided and limit of 2000 mg daily and fluid intake to 64 oz daily  Recommendations: Increase Furosemide 80 mg to 1 tablet am and 1/2 tablet in pm x 2 days and return to prescribed dosage of 80 mg every am.  Also increase Potassium 10 mEq to bid x 2 days and then return to prescribed dosage of once a day.  He verbalized understanding.  He stated he will attempt to limit salt intake.   ICM trend: 05/20/2016   Follow-up plan: ICM clinic phone appointment on 05/27/2016.  Office visit with Dr Rayann Heman on 06/18/2016.  Copy of ICM check sent to primary cardiologist and device physician.   Rosalene Billings, RN 05/20/2016 3:00 PM

## 2016-05-27 ENCOUNTER — Telehealth: Payer: Self-pay | Admitting: Cardiology

## 2016-05-27 ENCOUNTER — Ambulatory Visit (INDEPENDENT_AMBULATORY_CARE_PROVIDER_SITE_OTHER): Payer: Medicare HMO

## 2016-05-27 DIAGNOSIS — I5022 Chronic systolic (congestive) heart failure: Secondary | ICD-10-CM

## 2016-05-27 DIAGNOSIS — Z95810 Presence of automatic (implantable) cardiac defibrillator: Secondary | ICD-10-CM

## 2016-05-27 NOTE — Telephone Encounter (Signed)
F/U   Returning nurses call. Please call.

## 2016-05-27 NOTE — Telephone Encounter (Signed)
LMOVM reminding pt to send remote transmission.   

## 2016-05-27 NOTE — Telephone Encounter (Signed)
Mr. [place] having trouble sending transmission. We received transmission while on the phone. I will have Sharman Cheek, RN call back for ICM analysis.

## 2016-05-28 NOTE — Progress Notes (Signed)
EPIC Encounter for ICM Monitoring  Patient Name: [name] [name] is a 60 y.o. male Date: 05/28/2016 Primary Care Physican: Deloria Lair, MD Primary Cardiologist: Domenic Polite Electrophysiologist: Allred Dry Weight: 268 lb        Heart Failure questions reviewed, pt asymptomatic   Thoracic impedence returned to baseline after taking extra Furosemide as recommended on 05/20/2016 for 2 days.  Recommendations: No changes.  Low sodium diet education provided   ICM trend: 05/27/2016    Follow-up plan: ICM clinic phone appointment on 07/22/2016 and office appointment with Dr Rayann Heman 06/18/2016.  Copy of ICM check sent to device physician.   Rosalene Billings, RN 05/28/2016 8:37 AM

## 2016-06-18 ENCOUNTER — Encounter: Payer: Self-pay | Admitting: Internal Medicine

## 2016-06-18 ENCOUNTER — Ambulatory Visit (INDEPENDENT_AMBULATORY_CARE_PROVIDER_SITE_OTHER): Payer: Medicare HMO | Admitting: Internal Medicine

## 2016-06-18 VITALS — BP 132/88 | HR 82 | Ht 76.0 in | Wt 277.0 lb

## 2016-06-18 DIAGNOSIS — I4891 Unspecified atrial fibrillation: Secondary | ICD-10-CM | POA: Diagnosis not present

## 2016-06-18 DIAGNOSIS — I10 Essential (primary) hypertension: Secondary | ICD-10-CM | POA: Diagnosis not present

## 2016-06-18 DIAGNOSIS — I5022 Chronic systolic (congestive) heart failure: Secondary | ICD-10-CM | POA: Diagnosis not present

## 2016-06-18 LAB — CUP PACEART INCLINIC DEVICE CHECK
Battery Voltage: 3.11 V
Brady Statistic RV Percent Paced: 4.67 %
Date Time Interrogation Session: 20170804100203
HIGH POWER IMPEDANCE MEASURED VALUE: 342 Ohm
HIGH POWER IMPEDANCE MEASURED VALUE: 54 Ohm
Implantable Lead Implant Date: 20130529
Implantable Lead Location: 753860
Implantable Lead Model: 6945
Lead Channel Impedance Value: 418 Ohm
Lead Channel Pacing Threshold Pulse Width: 0.4 ms
Lead Channel Sensing Intrinsic Amplitude: 3.875 mV
Lead Channel Setting Pacing Amplitude: 2.5 V
Lead Channel Setting Pacing Pulse Width: 0.4 ms
MDC IDC LEAD IMPLANT DT: 20060502
MDC IDC LEAD LOCATION: 753860
MDC IDC LEAD MODEL: 6935
MDC IDC MSMT LEADCHNL RV PACING THRESHOLD AMPLITUDE: 0.75 V
MDC IDC SET LEADCHNL RV SENSING SENSITIVITY: 0.3 mV

## 2016-06-18 NOTE — Progress Notes (Signed)
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  Since last being seen in our clinic, the patient reports doing very well. He is working diligently at lifestyle change and has lost 8 lbs in the past year.  He finds phone call from [facility] device clinic very helpful.   Today, he denies symptoms of palpitations, chest pain, shortness of breath,  lower extremity edema, dizziness, presyncope, syncope, or ICD shocks.  The patient is otherwise without complaint today.   Past Medical History:  Diagnosis Date  . Anemia   . Atrial fibrillation ([place])   . AVM (arteriovenous malformation)    Duodenum - nonbleeding and EGD 11/13  . Cardiac arrest ([place]) 1998   ICD implanted at [facility]  . Cardiomyopathy, nonischemic (HCC)    LVEF 35-40%  . Chronic systolic heart failure ([place])   . Essential hypertension, benign   . History of colonic polyps    Colonoscopy 11/13  . Pneumonia    Severe with respiratory failure 10/13   Past Surgical History:  Procedure Laterality Date  . [place]  . COLONOSCOPY  09/26/2012   Procedure: COLONOSCOPY;  Surgeon: Beryle Beams, MD;  Location: [place];  Service: Endoscopy;  Laterality: N/A;  . Defibrillator system revision  04/12/12   MDT Protecta XT VR implanted at [facility] by Dr Deno Etienne with previously implanted system and leads extracted due to RV lead failure  . ESOPHAGOGASTRODUODENOSCOPY  09/25/2012   Procedure: ESOPHAGOGASTRODUODENOSCOPY (EGD);  Surgeon: Beryle Beams, MD;  Location: [facility] ENDOSCOPY;  Service: Endoscopy;  Laterality: N/A;    Current Outpatient Prescriptions  Medication Sig Dispense Refill  . carvedilol (COREG) 12.5 MG tablet Take 12.5 mg by mouth 2 (two) times daily with a meal.    . Cholecalciferol (VITAMIN D3) 2000 UNITS capsule Take 2,000 Units by mouth daily.      . cyclobenzaprine (FLEXERIL) 10 MG tablet Take 1  tablet (10 mg total) by mouth 3 (three) times daily as needed. 30 tablet 0  . diltiazem (CARDIZEM CD) 240 MG 24 hr capsule Take 1 capsule (240 mg total) by mouth daily.    . finasteride (PROSCAR) 5 MG tablet Take 5 mg by mouth daily.     . furosemide (LASIX) 80 MG tablet Take 1 tablet (80 mg total) by mouth daily. 90 tablet 3  . HYDROcodone-acetaminophen (NORCO/VICODIN) 5-325 MG per tablet Take one-two tabs po q 4-6 hrs prn pain 20 tablet 0  . isosorbide mononitrate (IMDUR) 30 MG 24 hr tablet Take 1 tablet (30 mg total) by mouth daily.    Marland Kitchen lisinopril (PRINIVIL,ZESTRIL) 10 MG tablet Take 0.5 tablets (5 mg total) by mouth daily. 90 tablet 3  . lisinopril (PRINIVIL,ZESTRIL) 5 MG tablet Take 5 mg by mouth daily.    . polyethylene glycol powder (GLYCOLAX/MIRALAX) powder Take 17 g by mouth daily as needed. For constipation - mix with 8 oz liquid and drink    . potassium chloride (MICRO-K) 10 MEQ CR capsule Take 1 capsule (10 mEq total) by mouth daily. 90 capsule 3  . pravastatin (PRAVACHOL) 40 MG tablet Take 40 mg by mouth at bedtime.     . tamsulosin (FLOMAX) 0.4 MG CAPS capsule Take 0.4 mg by mouth daily.    Marland Kitchen warfarin (COUMADIN) 2 MG tablet Take 2 mg by mouth daily.    Marland Kitchen warfarin (COUMADIN) 5 MG tablet Take 5 mg by mouth daily.  Taking with 3mg  for a total of 8mg      No current facility-administered medications for this visit.     Physical Exam: Vitals:   06/18/16 0850  BP: 132/88  Pulse: 82  SpO2: 97%  Weight: 277 lb (125.6 kg)  Height: 6\' 4"  (1.93 m)    GEN- The patient is well appearing, alert and oriented x 3 today.   Head- normocephalic, atraumatic Eyes-  Sclera clear, conjunctiva pink Ears- hearing intact Oropharynx- clear Lungs- Clear to ausculation bilaterally, normal work of breathing Chest- ICD pocket is well healed Heart- irregular rate and rhythm, no murmurs, rubs or gallops, PMI not laterally displaced GI- soft, NT, ND, + BS Extremities- no clubbing, cyanosis, or  edema  ICD interrogation- reviewed in detail today,  See PACEART report  Assessment and Plan:  1.  Chronic systolic dysfunction euvolemic today Stable on an appropriate medical regimen Normal ICD function See Pace Art report Impedance/ pacing/ sensing parameter are stable.  R waves are chronically low. Continue to follow in ICM device clinic 2 gram sodium restriction advised  2. Permanent afib Continue anticoagulation long term chads2 vasc is 2  3. Obesity Body mass index is 33.72 kg/m. I am very pleased with weight loss!  We celebrated with a fist bump today.  carelink Follow-up with Dr Domenic Polite as scheduled I will see in a year  He asks today about driving.  From my standpoint, I do not have any reason to restrict driving.  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  Thompson Grayer MD, [facility] 06/18/2016 9:49 AM

## 2016-06-18 NOTE — Patient Instructions (Addendum)
Medication Instructions:   Your physician recommends that you continue on your current medications as directed. Please refer to the Current Medication list given to you today.  Labwork: NONE  Testing/Procedures: NONE  Follow-Up: Your physician recommends that you schedule a follow-up appointment in: 1 year with Dr. Rayann Heman. Please schedule this appointment today before leaving the office.   Any Other Special Instructions Will Be Listed Below (If Applicable).  Device check from home on 09/20/16.  If you need a refill on your cardiac medications before your next appointment, please call your pharmacy.

## 2016-07-22 ENCOUNTER — Ambulatory Visit (INDEPENDENT_AMBULATORY_CARE_PROVIDER_SITE_OTHER): Payer: Medicare HMO

## 2016-07-22 DIAGNOSIS — Z95810 Presence of automatic (implantable) cardiac defibrillator: Secondary | ICD-10-CM | POA: Diagnosis not present

## 2016-07-22 DIAGNOSIS — I5022 Chronic systolic (congestive) heart failure: Secondary | ICD-10-CM

## 2016-07-23 ENCOUNTER — Telehealth: Payer: Self-pay

## 2016-07-23 NOTE — Progress Notes (Signed)
EPIC Encounter for ICM Monitoring  Patient Name: [name] [name] is a 60 y.o. male Date: 07/23/2016 Primary Care Physican: Deloria Lair, MD Primary Cardiologist: Domenic Polite Electrophysiologist: Allred Dry Weight: unknown       Attempted ICM call and unable to reach.  Transmission reviewed.   Thoracic impedance returned to normal.    Follow-up plan: ICM clinic phone appointment on 08/23/2016.  Copy of ICM check sent to device physician.   ICM trend: 07/23/2016       Rosalene Billings, RN 07/23/2016 9:56 AM

## 2016-07-23 NOTE — Telephone Encounter (Signed)
Remote ICM transmission received.  Attempted patient call and left message for return call.   

## 2016-08-23 ENCOUNTER — Ambulatory Visit (INDEPENDENT_AMBULATORY_CARE_PROVIDER_SITE_OTHER): Payer: Medicare HMO

## 2016-08-23 ENCOUNTER — Telehealth: Payer: Self-pay | Admitting: Cardiology

## 2016-08-23 DIAGNOSIS — I5022 Chronic systolic (congestive) heart failure: Secondary | ICD-10-CM

## 2016-08-23 DIAGNOSIS — Z95810 Presence of automatic (implantable) cardiac defibrillator: Secondary | ICD-10-CM | POA: Diagnosis not present

## 2016-08-23 NOTE — Telephone Encounter (Signed)
Spoke with pt and reminded pt of remote transmission that is due today. Pt verbalized understanding.   

## 2016-08-24 ENCOUNTER — Telehealth: Payer: Self-pay

## 2016-08-24 NOTE — Progress Notes (Signed)
EPIC Encounter for ICM Monitoring  Patient Name: [name] [name] is a 60 y.o. male Date: 08/24/2016 Primary Care Physican: Deloria Lair, MD Primary Cardiologist: Domenic Polite Electrophysiologist: Allred Dry Weight: unknown       Attempted ICM call and unable to reach.  Transmission reviewed.   Thoracic impedance normal   Follow-up plan: ICM clinic phone appointment on 09/24/2016.  Copy of ICM check sent to device physician.   ICM trend: 08/23/2016       Rosalene Billings, RN 08/24/2016 10:07 AM

## 2016-08-24 NOTE — Telephone Encounter (Signed)
Remote ICM transmission received.  Attempted patient call and left detailed message regarding transmission and next ICM scheduled for 09/24/2016.  Advised to return call for any fluid symptoms or questions.

## 2016-09-24 ENCOUNTER — Ambulatory Visit (INDEPENDENT_AMBULATORY_CARE_PROVIDER_SITE_OTHER): Payer: Medicare HMO | Admitting: *Deleted

## 2016-09-24 DIAGNOSIS — I428 Other cardiomyopathies: Secondary | ICD-10-CM | POA: Diagnosis not present

## 2016-09-24 DIAGNOSIS — I5022 Chronic systolic (congestive) heart failure: Secondary | ICD-10-CM

## 2016-09-24 DIAGNOSIS — Z95810 Presence of automatic (implantable) cardiac defibrillator: Secondary | ICD-10-CM

## 2016-09-24 NOTE — Progress Notes (Signed)
EPIC Encounter for ICM Monitoring  Patient Name: [name] [name] is a 60 y.o. male Date: 09/24/2016 Primary Care Physican: Deloria Lair, MD Primary Cardiologist:[place] Electrophysiologist: Allred Dry Weight:    unknown       Heart Failure questions reviewed, pt asymptomatic   Thoracic impedance normal   Recommendations: No changes.  Advised to limit salt intake to 2000 mg daily.  Encouraged to call for fluid symptoms.    Follow-up plan: ICM clinic phone appointment on 10/25/2016.  Copy of ICM check sent to device physician.   ICM trend: 09/24/2016       Rosalene Billings, RN 09/24/2016 1:35 PM   '

## 2016-09-24 NOTE — Progress Notes (Signed)
Remote ICD transmission.   

## 2016-10-21 ENCOUNTER — Other Ambulatory Visit: Payer: Self-pay

## 2016-10-21 ENCOUNTER — Ambulatory Visit (INDEPENDENT_AMBULATORY_CARE_PROVIDER_SITE_OTHER): Payer: Medicare HMO

## 2016-10-21 DIAGNOSIS — I428 Other cardiomyopathies: Secondary | ICD-10-CM | POA: Diagnosis not present

## 2016-10-21 LAB — CUP PACEART REMOTE DEVICE CHECK
Battery Voltage: 3.11 V
Brady Statistic RV Percent Paced: 4.62 %
HIGH POWER IMPEDANCE MEASURED VALUE: 342 Ohm
HighPow Impedance: 57 Ohm
Implantable Lead Implant Date: 20130529
Implantable Lead Location: 753860
Implantable Lead Location: 753860
Implantable Lead Model: 6935
Implantable Lead Model: 6945
Lead Channel Impedance Value: 418 Ohm
Lead Channel Pacing Threshold Amplitude: 0.875 V
Lead Channel Sensing Intrinsic Amplitude: 2.875 mV
Lead Channel Sensing Intrinsic Amplitude: 2.875 mV
Lead Channel Setting Pacing Amplitude: 2.5 V
Lead Channel Setting Pacing Pulse Width: 0.4 ms
Lead Channel Setting Sensing Sensitivity: 0.3 mV
MDC IDC LEAD IMPLANT DT: 20060502
MDC IDC MSMT LEADCHNL RV PACING THRESHOLD PULSEWIDTH: 0.4 ms
MDC IDC PG IMPLANT DT: 20130529
MDC IDC SESS DTM: 20171110072707

## 2016-10-22 ENCOUNTER — Telehealth: Payer: Self-pay | Admitting: *Deleted

## 2016-10-22 NOTE — Telephone Encounter (Signed)
Patient informed and copy sent to PCP. 

## 2016-10-22 NOTE — Telephone Encounter (Signed)
-----   Message from Satira Sark, MD sent at 10/22/2016 11:41 AM EST ----- Results reviewed. LVEF 40-45% has improvement compared to prior assessment. Continue medical therapy and routine follow-up as scheduled. A copy of this test should be forwarded to [facility] B, MD.

## 2016-10-25 ENCOUNTER — Ambulatory Visit (INDEPENDENT_AMBULATORY_CARE_PROVIDER_SITE_OTHER): Payer: Medicare HMO

## 2016-10-25 ENCOUNTER — Telehealth: Payer: Self-pay | Admitting: Cardiology

## 2016-10-25 DIAGNOSIS — I5022 Chronic systolic (congestive) heart failure: Secondary | ICD-10-CM

## 2016-10-25 DIAGNOSIS — Z95810 Presence of automatic (implantable) cardiac defibrillator: Secondary | ICD-10-CM | POA: Diagnosis not present

## 2016-10-25 NOTE — Telephone Encounter (Signed)
Spoke with pt and reminded pt of remote transmission that is due today. Pt verbalized understanding.   

## 2016-10-26 ENCOUNTER — Telehealth: Payer: Self-pay

## 2016-10-26 NOTE — Progress Notes (Signed)
EPIC Encounter for ICM Monitoring  Patient Name: [name] [name] is a 60 y.o. male Date: 10/26/2016 Primary Care Physican: Deloria Lair, MD Primary Cardiologist:[place] Electrophysiologist: Allred Dry Weight:unknown                       Attempted ICM call and unable to reach. Left message to return call.  Transmission reviewed.  Thoracic impedance normal   Recommendations: NONE - unable to reach   Follow-up plan: ICM clinic phone appointment on 11/26/2016.  Copy of ICM check sent to device physician.   ICM trend: 10/25/2016       Rosalene Billings, RN 10/26/2016 1:13 PM

## 2016-10-26 NOTE — Telephone Encounter (Signed)
Remote ICM transmission received.  Attempted patient call and left message to return call.   

## 2016-11-26 ENCOUNTER — Ambulatory Visit (INDEPENDENT_AMBULATORY_CARE_PROVIDER_SITE_OTHER): Payer: Medicare HMO

## 2016-11-26 ENCOUNTER — Telehealth: Payer: Self-pay

## 2016-11-26 DIAGNOSIS — Z95810 Presence of automatic (implantable) cardiac defibrillator: Secondary | ICD-10-CM

## 2016-11-26 DIAGNOSIS — I5022 Chronic systolic (congestive) heart failure: Secondary | ICD-10-CM | POA: Diagnosis not present

## 2016-11-26 NOTE — Telephone Encounter (Signed)
Remote ICM transmission received.  Attempted patient call and left detailed message regarding transmission and next ICM scheduled for 12/27/2016.  Advised to return call for any fluid symptoms or questions.

## 2016-11-26 NOTE — Progress Notes (Signed)
EPIC Encounter for ICM Monitoring  Patient Name: [name] [name] is a 61 y.o. male Date: 11/26/2016 Primary Care Physican: Deloria Lair, MD Primary Cardiologist:[place] Electrophysiologist: Allred Dry Weight:unknown      Attempted ICM call and unable to reach.  Left detailed message regarding transmission.  Transmission reviewed.   Thoracic impedance normal   Recommendations: Provided ICM number and encouraged to call for fluid symptoms.  Follow-up plan: ICM clinic phone appointment on 12/27/2016.  Copy of ICM check sent to device physician.   3 month ICM trend: 11/26/2016   1 Year ICM trend:      Rosalene Billings, RN 11/26/2016 8:40 AM

## 2016-11-30 ENCOUNTER — Encounter: Payer: Self-pay | Admitting: Cardiology

## 2016-11-30 ENCOUNTER — Ambulatory Visit (INDEPENDENT_AMBULATORY_CARE_PROVIDER_SITE_OTHER): Payer: Medicare HMO | Admitting: Cardiology

## 2016-11-30 VITALS — BP 123/85 | HR 93 | Ht 77.0 in | Wt 282.0 lb

## 2016-11-30 DIAGNOSIS — Z95810 Presence of automatic (implantable) cardiac defibrillator: Secondary | ICD-10-CM

## 2016-11-30 DIAGNOSIS — I10 Essential (primary) hypertension: Secondary | ICD-10-CM

## 2016-11-30 DIAGNOSIS — I482 Chronic atrial fibrillation, unspecified: Secondary | ICD-10-CM

## 2016-11-30 DIAGNOSIS — I428 Other cardiomyopathies: Secondary | ICD-10-CM | POA: Diagnosis not present

## 2016-11-30 MED ORDER — CARVEDILOL 25 MG PO TABS: 25.0000 mg | ORAL_TABLET | Freq: Two times a day (BID) | ORAL | 3 refills | Status: DC

## 2016-11-30 MED ORDER — DILTIAZEM HCL ER COATED BEADS 180 MG PO CP24: 180.0000 mg | ORAL_CAPSULE | Freq: Every day | ORAL | 3 refills | Status: DC

## 2016-11-30 NOTE — Progress Notes (Signed)
Cardiology Office Note  Date: 11/30/2016   ID: [name] DICECCO, DOB 1956/07/31, MRN [id number]  PCP: [name] Lair, MD  Primary Cardiologist: [name] Lesches, MD   Chief Complaint  Patient presents with  . Cardiomyopathy    History of Present Illness: [name] [name] is a 61 y.o. male last seen in June 2017. He presents for a routine follow-up visit. Reports less activity over the winter, his weight has been relatively stable. He has had no chest pain or palpitations, indicates NYHA class II dyspnea in general.  He continues on Coumadin and follows with Dr. Scotty [name]. Does not indicate any bleeding problems.  He follows with Dr. Rayann [name] in the device clinic, Medtronic ICD in place. Recent thoracic impedance measurements in normal range.  I reviewed his follow-up echocardiogram from December 2017, outlined below. LVEF had improved to the range of 40-45% at that time.  We went over his medications which are outlined below. Discussed plan to increase Coreg further and try to reduce his Cardizem CD dose, perhaps ultimately stop it if we are able.  Past Medical History:  Diagnosis Date  . Anemia   . Atrial fibrillation ([place])   . AVM (arteriovenous malformation)    Duodenum - nonbleeding and EGD 11/13  . Cardiac arrest ([place]) 1998   ICD implanted at [facility]  . Cardiomyopathy, nonischemic (HCC)    LVEF 35-40%  . Chronic systolic heart failure ([place])   . Essential hypertension, benign   . History of colonic polyps    Colonoscopy 11/13  . Pneumonia    Severe with respiratory failure 10/13    Past Surgical History:  Procedure Laterality Date  . [place]  . COLONOSCOPY  09/26/2012   Procedure: COLONOSCOPY;  Surgeon: [name] Beams, MD;  Location: [place];  Service: Endoscopy;  Laterality: N/A;  . Defibrillator system revision  04/12/12   MDT Protecta XT VR implanted at [facility] by Dr [name] [name] with previously implanted system and leads extracted due  to RV lead failure  . ESOPHAGOGASTRODUODENOSCOPY  09/25/2012   Procedure: ESOPHAGOGASTRODUODENOSCOPY (EGD);  Surgeon: [name] Beams, MD;  [facility] END[facility]COPY;  Service: Endoscopy;  Laterality: N/A;    Current Outpatient Prescriptions  Medication Sig Dispense Refill  . Cholecalciferol (VITAMIN D3) 2000 UNITS capsule Take 2,000 Units by mouth daily.      . finasteride (PROSCAR) 5 MG tablet Take 5 mg by mouth daily.     . furosemide (LASIX) 80 MG tablet Take 1 tablet (80 mg total) by mouth daily. 90 tablet 3  . isosorbide mononitrate (IMDUR) 30 MG 24 hr tablet Take 1 tablet (30 mg total) by mouth daily.    Marland Kitchen lisinopril (PRINIVIL,ZESTRIL) 5 MG tablet Take 5 mg by mouth daily.    . polyethylene glycol powder (GLYCOLAX/MIRALAX) powder Take 17 g by mouth daily as needed. For constipation - mix with 8 oz liquid and drink    . potassium chloride (MICRO-K) 10 MEQ CR capsule Take 1 capsule (10 mEq total) by mouth daily. 90 capsule 3  . pravastatin (PRAVACHOL) 40 MG tablet Take 40 mg by mouth at bedtime.     . tamsulosin (FLOMAX) 0.4 MG CAPS capsule Take 0.4 mg by mouth daily.    Marland Kitchen warfarin (COUMADIN) 2 MG tablet Take 2 mg by mouth daily.    Marland Kitchen warfarin (COUMADIN) 5 MG tablet Take 5 mg by mouth daily. Taking with 3mg  for a total of 8mg     . carvedilol (COREG) 25 MG tablet  Take 1 tablet (25 mg total) by mouth 2 (two) times daily. 180 tablet 3  . diltiazem (CARDIZEM CD) 180 MG 24 hr capsule Take 1 capsule (180 mg total) by mouth daily. 90 capsule 3   No current facility-administered medications for this visit.    Allergies:  Patient has no known allergies.   Social History: The patient  reports that he quit smoking about 20 years ago. His smoking use included Cigarettes. He started smoking about 38 years ago. He has a 4.80 pack-year smoking history. He has never used smokeless tobacco. He reports that he uses drugs, including Marijuana. He reports that he does not drink alcohol.   ROS:  Please see  the history of present illness. Otherwise, complete review of systems is positive for none.  All other systems are reviewed and negative.   Physical Exam: VS:  BP 123/85   Pulse 93   Ht 6\' 5"  (1.956 m)   Wt 282 lb (127.9 kg)   BMI 33.44 kg/m , BMI Body mass index is 33.44 kg/m.  Wt Readings from Last 3 Encounters:  11/30/16 282 lb (127.9 kg)  06/18/16 277 lb (125.6 kg)  04/22/16 275 lb (124.7 kg)    Comfortable. Tall stature. Obese.  HEENT: Conjunctiva and lids normal, oropharynx clear.  Neck: Supple, no elevated JVP or carotid bruits, no thyromegaly.  Lungs: Course but clear to auscultation, nonlabored breathing at rest.  Cardiac: Irregularly irregular, no S3 or significant systolic murmur, indistinct PMI.  Abdomen: Soft, nontender, bowel sounds present.  Extremities: No pitting edema, distal pulses 1-2+.  Skin: Warm and dry. Musculoskeletal: No kyphosis. Neuropsychiatric: Alert and oriented 3, affect appropriate.  ECG: I personally reviewed the tracing from 06/18/2016 which showed rate-controlled atrial fibrillation.  Recent Labwork:  January 2017: TSH 1.86, cholesterol 99, triglycerides 66, HDL 30, LDL 56, BUN 10, creatinine 0.81, AST 14, ALT 10, potassium 4.3, hemoglobin 14.4, platelets 219   Other Studies Reviewed Today:  Echocardiogram 10/21/2016: Study Conclusions  - Left ventricle: The cavity size was normal. Wall thickness was   increased in a pattern of mild LVH. Systolic function was mildly   to moderately reduced. The estimated ejection fraction was in the   range of 40% to 45%. Diffuse hypokinesis. Findings consistent   with left ventricular diastolic dysfunction. Doppler parameters   are consistent with high ventricular filling pressure. - Mitral valve: Mildly thickened leaflets . There was mild   regurgitation. - Left atrium: The atrium was mildly dilated. - Right ventricle: The cavity size was mildly dilated. Pacer wire   or catheter noted in  right ventricle. - Right atrium: The atrium was moderately dilated. Pacer wire or   catheter noted in right atrium. - Tricuspid valve: There was moderate regurgitation. - Systemic veins: IVC dilated with normal respiratory variation.   Estimated CVP 8 mmHg. - Pericardium, extracardiac: A trivial pericardial effusion was   identified. Features were not consistent with tamponade   physiology.  Assessment and Plan:  1. Chronic atrial fibrillation. Plan to continue strategy of heart rate control and anticoagulation. With concurrent cardiomyopathy, we will increase Coreg dose and reduce his Cardizem CD dose. Might be able to ultimately stop calcium channel blocker depending on heart rate control.  2. Nonischemic cardiomyopathy, LVEF 40-45% by recent echocardiogram. As noted above, plan to increase Coreg and reduce Cardizem CD. Otherwise continue ACE inhibitor and Lasix.  3. Medtronic ICD in place, follows with Dr. Rayann [name]. No device shocks. Recent thoracic impedance measurement normal.  4.  Essential hypertension, blood pressure control is good today.  Current medicines were reviewed with the patient today.  Disposition: Follow-up in 6 months.  Signed, Satira Sark, MD, [facility] 11/30/2016 8:29 AM    [facility] [place], [place], [place] [address] Phone: [phone]; Fax: [phone]

## 2016-11-30 NOTE — Patient Instructions (Signed)
Your physician wants you to follow-up in: [place] DR. Domenic Polite  You will receive a reminder letter in the mail two months in advance. If you don't receive a letter, please call our office to schedule the follow-up appointment.  Your physician has recommended you make the following change in your medication:   INCREASE COREG 25 MG TWICE DAILY  DECREASE CARDIZEM 180 MG DAILY  Thank you for choosing [facility]!!

## 2016-12-03 ENCOUNTER — Other Ambulatory Visit: Payer: Self-pay | Admitting: Cardiology

## 2016-12-27 ENCOUNTER — Ambulatory Visit (INDEPENDENT_AMBULATORY_CARE_PROVIDER_SITE_OTHER): Payer: Medicare HMO | Admitting: *Deleted

## 2016-12-27 DIAGNOSIS — I428 Other cardiomyopathies: Secondary | ICD-10-CM

## 2016-12-27 DIAGNOSIS — I5022 Chronic systolic (congestive) heart failure: Secondary | ICD-10-CM

## 2016-12-27 DIAGNOSIS — Z95810 Presence of automatic (implantable) cardiac defibrillator: Secondary | ICD-10-CM

## 2016-12-27 NOTE — Progress Notes (Signed)
Remote ICD transmission.   

## 2016-12-27 NOTE — Progress Notes (Signed)
EPIC Encounter for ICM Monitoring  Patient Name: [name] [name] is a 61 y.o. male Date: 12/27/2016 Primary Care Physican: Deloria Lair, MD Primary Cardiologist:[place] Electrophysiologist: Allred Dry Weight:270 lbs          Heart Failure questions reviewed, pt asymptomatic and reviewed symptoms to report.  He said Dr Domenic Polite decreased Furosemide to 80 mg 1 tablet daily and is doing ok right now.    Thoracic impedance close to normal.  Recommendations: No changes. Reminded to limit dietary salt intake to 2000 mg/day and fluid intake to < 2 liters/day. Encouraged to call for fluid symptoms.  Follow-up plan: ICM clinic phone appointment on 01/27/2017.  Copy of ICM check sent to device physician.   3 month ICM trend: 12/27/2016   1 Year ICM trend:     Rosalene Billings, RN 12/27/2016 4:53 PM

## 2016-12-28 ENCOUNTER — Encounter: Payer: Self-pay | Admitting: Cardiology

## 2016-12-30 LAB — CUP PACEART REMOTE DEVICE CHECK
Brady Statistic RV Percent Paced: 2.72 %
HIGH POWER IMPEDANCE MEASURED VALUE: 304 Ohm
HIGH POWER IMPEDANCE MEASURED VALUE: 63 Ohm
Implantable Lead Implant Date: 20060502
Implantable Lead Location: 753860
Implantable Pulse Generator Implant Date: 20130529
Lead Channel Impedance Value: 399 Ohm
Lead Channel Pacing Threshold Amplitude: 0.875 V
Lead Channel Pacing Threshold Pulse Width: 0.4 ms
Lead Channel Setting Pacing Pulse Width: 0.4 ms
Lead Channel Setting Sensing Sensitivity: 0.3 mV
MDC IDC LEAD IMPLANT DT: 20130529
MDC IDC LEAD LOCATION: 753860
MDC IDC MSMT BATTERY VOLTAGE: 3.09 V
MDC IDC MSMT LEADCHNL RV SENSING INTR AMPL: 3.5 mV
MDC IDC MSMT LEADCHNL RV SENSING INTR AMPL: 3.5 mV
MDC IDC SESS DTM: 20180212093828
MDC IDC SET LEADCHNL RV PACING AMPLITUDE: 2.5 V

## 2017-01-15 ENCOUNTER — Other Ambulatory Visit: Payer: Self-pay | Admitting: Cardiology

## 2017-01-27 ENCOUNTER — Ambulatory Visit (INDEPENDENT_AMBULATORY_CARE_PROVIDER_SITE_OTHER): Payer: Medicare HMO

## 2017-01-27 DIAGNOSIS — Z95810 Presence of automatic (implantable) cardiac defibrillator: Secondary | ICD-10-CM

## 2017-01-27 DIAGNOSIS — I5022 Chronic systolic (congestive) heart failure: Secondary | ICD-10-CM

## 2017-01-27 NOTE — Progress Notes (Signed)
EPIC Encounter for ICM Monitoring  Patient Name: [name] [name] is a 61 y.o. male Date: 01/27/2017 Primary Care Physican: Deloria Lair, MD Primary Cardiologist:[place] Electrophysiologist: Allred Dry Weight:267 lbs                                                      Heart Failure questions reviewed, pt asymptomatic .   Thoracic impedance normal  Prescribed and confirmed dosage: Furosemide 80 mg 1 tablet daily   Recommendations: No changes. Reminded to limit dietary salt intake to 2000 mg/day and fluid intake to < 2 liters/day. Encouraged to call for fluid symptoms.  Follow-up plan: ICM clinic phone appointment on 02/28/2017.  Copy of ICM check sent to device physician.   3 month ICM trend: 01/27/2017   1 Year ICM trend:      Rosalene Billings, RN 01/27/2017 4:46 PM

## 2017-02-28 ENCOUNTER — Ambulatory Visit (INDEPENDENT_AMBULATORY_CARE_PROVIDER_SITE_OTHER): Payer: Medicare HMO

## 2017-02-28 DIAGNOSIS — Z95810 Presence of automatic (implantable) cardiac defibrillator: Secondary | ICD-10-CM

## 2017-02-28 DIAGNOSIS — I5022 Chronic systolic (congestive) heart failure: Secondary | ICD-10-CM

## 2017-02-28 NOTE — Progress Notes (Signed)
EPIC Encounter for ICM Monitoring  Patient Name: [name] [name] is a 61 y.o. male Date: 02/28/2017 Primary Care Physican: Deloria Lair, MD Primary Cardiologist:[place] Electrophysiologist: Allred Dry Weight:262 lbs                                             Heart Failure questions reviewed, pt asymptomatic.   Thoracic impedance normal   Prescribed and confirmed dosage: Furosemide 80 mg 1 tablet daily   Recommendations: No changes. He recognizes restaurant foods and grocery store food has a lot of salt and tried to limit the amount.  Encouraged to call for fluid symptoms.  Follow-up plan: ICM clinic phone appointment on 03/31/2017.    Copy of ICM check sent to device physician.   3 month ICM trend: 02/28/2017   1 Year ICM trend:      Rosalene Billings, RN 02/28/2017 8:01 AM

## 2017-03-31 ENCOUNTER — Ambulatory Visit (INDEPENDENT_AMBULATORY_CARE_PROVIDER_SITE_OTHER): Payer: Medicare HMO | Admitting: *Deleted

## 2017-03-31 DIAGNOSIS — I428 Other cardiomyopathies: Secondary | ICD-10-CM | POA: Diagnosis not present

## 2017-03-31 DIAGNOSIS — Z95810 Presence of automatic (implantable) cardiac defibrillator: Secondary | ICD-10-CM

## 2017-03-31 DIAGNOSIS — I5022 Chronic systolic (congestive) heart failure: Secondary | ICD-10-CM

## 2017-03-31 LAB — CUP PACEART REMOTE DEVICE CHECK
Battery Voltage: 3.1 V
Brady Statistic RV Percent Paced: 2.48 %
Date Time Interrogation Session: 20180517041807
HIGH POWER IMPEDANCE MEASURED VALUE: 304 Ohm
HIGH POWER IMPEDANCE MEASURED VALUE: 51 Ohm
Implantable Lead Implant Date: 20060502
Implantable Lead Implant Date: 20130529
Implantable Lead Location: 753860
Implantable Lead Model: 6935
Lead Channel Impedance Value: 399 Ohm
Lead Channel Pacing Threshold Pulse Width: 0.4 ms
Lead Channel Sensing Intrinsic Amplitude: 3.875 mV
Lead Channel Sensing Intrinsic Amplitude: 3.875 mV
Lead Channel Setting Pacing Amplitude: 2.5 V
Lead Channel Setting Pacing Pulse Width: 0.4 ms
Lead Channel Setting Sensing Sensitivity: 0.3 mV
MDC IDC LEAD LOCATION: 753860
MDC IDC MSMT LEADCHNL RV PACING THRESHOLD AMPLITUDE: 0.875 V
MDC IDC PG IMPLANT DT: 20130529

## 2017-03-31 NOTE — Progress Notes (Signed)
EPIC Encounter for ICM Monitoring  Patient Name: [name] [name] is a 61 y.o. male Date: 03/31/2017 Primary Care Physican: Deloria Lair., MD Primary [place] Electrophysiologist: Allred Dry Weight:260 lbs       Heart Failure questions reviewed, pt asymptomatic.   Thoracic impedance normal but just starting to trend below baseline suggesting fluid accumulation 03/30/2017.  He has been eating at restaurants in the last few days.   Prescribed and confirmed dosage: Furosemide 80 mg 1 tablet daily   Recommendations: Advised to limit salt whether at home or at restaurants.  Reviewed fluid symptoms and encouraged to call for fluid symptoms or use local ER for any urgent symptoms.  Follow-up plan: ICM clinic phone appointment on 05/03/2017.  Office appointment scheduled on 06/07/2017 with Dr Domenic Polite and 06/17/2017 with Dr Rayann Heman.  Copy of ICM check sent to device physician.   3 month ICM trend: 03/31/2017   1 Year ICM trend:      Rosalene Billings, RN 03/31/2017 1:08 PM

## 2017-03-31 NOTE — Progress Notes (Signed)
Remote ICD transmission.   

## 2017-04-01 ENCOUNTER — Encounter: Payer: Self-pay | Admitting: Cardiology

## 2017-05-03 ENCOUNTER — Ambulatory Visit (INDEPENDENT_AMBULATORY_CARE_PROVIDER_SITE_OTHER): Payer: Medicare HMO

## 2017-05-03 DIAGNOSIS — I5022 Chronic systolic (congestive) heart failure: Secondary | ICD-10-CM | POA: Diagnosis not present

## 2017-05-03 DIAGNOSIS — Z95810 Presence of automatic (implantable) cardiac defibrillator: Secondary | ICD-10-CM

## 2017-05-03 NOTE — Progress Notes (Signed)
EPIC Encounter for ICM Monitoring  Patient Name: DAYTON KENLEY is a 61 y.o. male Date: 05/03/2017 Primary Care Physican: Deloria Lair., MD Primary Cardiologist:[place] Electrophysiologist: Allred Dry Weight:255 lbs      Heart Failure questions reviewed, pt asymptomatic.   Thoracic impedance normal.  Prescribed dosage: Furosemide 80 mg 1 tablet daily   Recommendations: No changes.  Advised to limit salt intake to 2000 mg/day and fluid intake to < 2 liters/day.  Encouraged to call for fluid symptoms.  Follow-up plan: ICM clinic phone appointment on 06/07/2017.  Office appointment scheduled on 06/08/2017 with Dr. Domenic Polite and 06/17/2017 with Dr Rayann Heman.  Copy of ICM check sent to device physician.   3 month ICM trend: 05/03/2017   1 Year ICM trend:      Rosalene Billings, RN 05/03/2017 11:14 AM

## 2017-05-13 ENCOUNTER — Encounter (HOSPITAL_COMMUNITY): Payer: Self-pay

## 2017-05-13 ENCOUNTER — Emergency Department (HOSPITAL_COMMUNITY): Payer: Medicare HMO

## 2017-05-13 ENCOUNTER — Observation Stay (HOSPITAL_COMMUNITY)
Admission: EM | Admit: 2017-05-13 | Discharge: 2017-05-13 | Disposition: A | Discharge status: Home / Self Care | Payer: Medicare HMO | Attending: Internal Medicine | Admitting: Internal Medicine

## 2017-05-13 DIAGNOSIS — Z7901 Long term (current) use of anticoagulants: Secondary | ICD-10-CM | POA: Diagnosis not present

## 2017-05-13 DIAGNOSIS — I4891 Unspecified atrial fibrillation: Secondary | ICD-10-CM

## 2017-05-13 DIAGNOSIS — Z95810 Presence of automatic (implantable) cardiac defibrillator: Secondary | ICD-10-CM | POA: Diagnosis present

## 2017-05-13 DIAGNOSIS — E871 Hypo-osmolality and hyponatremia: Secondary | ICD-10-CM | POA: Diagnosis not present

## 2017-05-13 DIAGNOSIS — I5022 Chronic systolic (congestive) heart failure: Secondary | ICD-10-CM | POA: Insufficient documentation

## 2017-05-13 DIAGNOSIS — I130 Hypertensive heart and chronic kidney disease with heart failure and stage 1 through stage 4 chronic kidney disease, or unspecified chronic kidney disease: Secondary | ICD-10-CM | POA: Diagnosis not present

## 2017-05-13 DIAGNOSIS — R1012 Left upper quadrant pain: Secondary | ICD-10-CM | POA: Diagnosis not present

## 2017-05-13 DIAGNOSIS — F1290 Cannabis use, unspecified, uncomplicated: Secondary | ICD-10-CM | POA: Diagnosis not present

## 2017-05-13 DIAGNOSIS — R079 Chest pain, unspecified: Secondary | ICD-10-CM

## 2017-05-13 DIAGNOSIS — I482 Chronic atrial fibrillation: Secondary | ICD-10-CM

## 2017-05-13 DIAGNOSIS — I10 Essential (primary) hypertension: Secondary | ICD-10-CM | POA: Diagnosis present

## 2017-05-13 DIAGNOSIS — Z87891 Personal history of nicotine dependence: Secondary | ICD-10-CM | POA: Insufficient documentation

## 2017-05-13 DIAGNOSIS — Z79899 Other long term (current) drug therapy: Secondary | ICD-10-CM | POA: Diagnosis not present

## 2017-05-13 DIAGNOSIS — N182 Chronic kidney disease, stage 2 (mild): Secondary | ICD-10-CM | POA: Diagnosis not present

## 2017-05-13 DIAGNOSIS — R141 Gas pain: Secondary | ICD-10-CM

## 2017-05-13 LAB — CBC WITH DIFFERENTIAL/PLATELET
BASOS ABS: 0 10*3/uL (ref 0.0–0.1)
BASOS PCT: 0 %
EOS ABS: 0.1 10*3/uL (ref 0.0–0.7)
Eosinophils Relative: 1 %
HCT: 35.9 % — ABNORMAL LOW (ref 39.0–52.0)
HEMOGLOBIN: 12.8 g/dL — AB (ref 13.0–17.0)
Lymphocytes Relative: 15 %
Lymphs Abs: 1.2 10*3/uL (ref 0.7–4.0)
MCH: 28.2 pg (ref 26.0–34.0)
MCHC: 35.7 g/dL (ref 30.0–36.0)
MCV: 79.1 fL (ref 78.0–100.0)
Monocytes Absolute: 0.8 10*3/uL (ref 0.1–1.0)
Monocytes Relative: 10 %
Neutro Abs: 5.8 10*3/uL (ref 1.7–7.7)
Neutrophils Relative %: 74 %
Platelets: 186 10*3/uL (ref 150–400)
RBC: 4.54 MIL/uL (ref 4.22–5.81)
RDW: 14.2 % (ref 11.5–15.5)
WBC: 7.8 10*3/uL (ref 4.0–10.5)

## 2017-05-13 LAB — BASIC METABOLIC PANEL
ANION GAP: 7 (ref 5–15)
BUN: 8 mg/dL (ref 6–20)
CALCIUM: 9.4 mg/dL (ref 8.9–10.3)
CO2: 27 mmol/L (ref 22–32)
Chloride: 96 mmol/L — ABNORMAL LOW (ref 101–111)
Creatinine, Ser: 0.77 mg/dL (ref 0.61–1.24)
Glucose, Bld: 121 mg/dL — ABNORMAL HIGH (ref 65–99)
Potassium: 4 mmol/L (ref 3.5–5.1)
Sodium: 130 mmol/L — ABNORMAL LOW (ref 135–145)

## 2017-05-13 LAB — OSMOLALITY: Osmolality: 275 mOsm/kg (ref 275–295)

## 2017-05-13 LAB — COMPREHENSIVE METABOLIC PANEL
ALBUMIN: 3.7 g/dL (ref 3.5–5.0)
ALT: 15 U/L — ABNORMAL LOW (ref 17–63)
AST: 14 U/L — ABNORMAL LOW (ref 15–41)
Alkaline Phosphatase: 60 U/L (ref 38–126)
Anion gap: 9 (ref 5–15)
BUN: 8 mg/dL (ref 6–20)
CHLORIDE: 91 mmol/L — AB (ref 101–111)
CO2: 23 mmol/L (ref 22–32)
CREATININE: 0.71 mg/dL (ref 0.61–1.24)
Calcium: 9.3 mg/dL (ref 8.9–10.3)
GFR calc non Af Amer: >60 mL/min (ref >60)
Glucose, Bld: 111 mg/dL — ABNORMAL HIGH (ref 65–99)
Potassium: 3.7 mmol/L (ref 3.5–5.1)
SODIUM: 123 mmol/L — AB (ref 135–145)
Total Bilirubin: 1.4 mg/dL — ABNORMAL HIGH (ref 0.3–1.2)
Total Protein: 7.9 g/dL (ref 6.5–8.1)

## 2017-05-13 LAB — D-DIMER, QUANTITATIVE: D-Dimer, Quant: 0.4 ug/mL-FEU (ref 0.00–0.50)

## 2017-05-13 LAB — URINALYSIS, ROUTINE W REFLEX MICROSCOPIC
Bacteria, UA: NONE SEEN
Bilirubin Urine: NEGATIVE
Glucose, UA: NEGATIVE mg/dL
Ketones, ur: NEGATIVE mg/dL
Leukocytes, UA: NEGATIVE
NITRITE: NEGATIVE
PROTEIN: NEGATIVE mg/dL
RBC / HPF: NONE SEEN RBC/hpf (ref 0–5)
Specific Gravity, Urine: 1 — ABNORMAL LOW (ref 1.005–1.030)
Squamous Epithelial / LPF: NONE SEEN
WBC, UA: NONE SEEN WBC/hpf (ref 0–5)
pH: 6 (ref 5.0–8.0)

## 2017-05-13 LAB — LIPASE, BLOOD: Lipase: 22 U/L (ref 11–51)

## 2017-05-13 LAB — TROPONIN I: Troponin I: <0.03 ng/mL (ref <0.03)

## 2017-05-13 LAB — TSH: TSH: 2.152 u[IU]/mL (ref 0.350–4.500)

## 2017-05-13 LAB — PROTIME-INR
INR: 2.38
Prothrombin Time: 26.4 seconds — ABNORMAL HIGH (ref 11.4–15.2)

## 2017-05-13 MED ORDER — CARVEDILOL 12.5 MG PO TABS
25.0000 mg | ORAL_TABLET | Freq: Two times a day (BID) | ORAL | Status: DC
  Administered 2017-05-13: 25 mg via ORAL
  Filled 2017-05-13: qty 2

## 2017-05-13 MED ORDER — LISINOPRIL 5 MG PO TABS
5.0000 mg | ORAL_TABLET | Freq: Every day | ORAL | Status: DC
  Administered 2017-05-13: 5 mg via ORAL
  Filled 2017-05-13: qty 1

## 2017-05-13 MED ORDER — WARFARIN - PHARMACIST DOSING INPATIENT: Freq: Every day | Status: DC

## 2017-05-13 MED ORDER — FINASTERIDE 5 MG PO TABS
5.0000 mg | ORAL_TABLET | Freq: Every day | ORAL | Status: DC
  Administered 2017-05-13: 5 mg via ORAL
  Filled 2017-05-13 (×4): qty 1

## 2017-05-13 MED ORDER — PRAVASTATIN SODIUM 40 MG PO TABS: 40.0000 mg | ORAL_TABLET | Freq: Every day | ORAL | Status: DC

## 2017-05-13 MED ORDER — POLYETHYLENE GLYCOL 3350 17 G PO PACK: 17.0000 g | PACK | Freq: Every day | ORAL | Status: DC | PRN

## 2017-05-13 MED ORDER — POLYETHYLENE GLYCOL 3350 17 GM/SCOOP PO POWD
17.0000 g | Freq: Every day | ORAL | Status: DC | PRN
  Filled 2017-05-13: qty 255

## 2017-05-13 MED ORDER — WARFARIN SODIUM 5 MG PO TABS: 7.0000 mg | ORAL_TABLET | Freq: Once | ORAL | Status: DC

## 2017-05-13 MED ORDER — ISOSORBIDE MONONITRATE ER 60 MG PO TB24
30.0000 mg | ORAL_TABLET | Freq: Every day | ORAL | Status: DC
  Administered 2017-05-13: 30 mg via ORAL
  Filled 2017-05-13: qty 1

## 2017-05-13 MED ORDER — SODIUM CHLORIDE 0.9 % IV BOLUS (SEPSIS)
500.0000 mL | Freq: Once | INTRAVENOUS | Status: AC
  Administered 2017-05-13: 500 mL via INTRAVENOUS

## 2017-05-13 MED ORDER — DILTIAZEM HCL ER COATED BEADS 180 MG PO CP24
180.0000 mg | ORAL_CAPSULE | Freq: Every day | ORAL | Status: DC
  Administered 2017-05-13: 180 mg via ORAL
  Filled 2017-05-13: qty 1

## 2017-05-13 MED ORDER — TAMSULOSIN HCL 0.4 MG PO CAPS
0.4000 mg | ORAL_CAPSULE | Freq: Every day | ORAL | Status: DC
  Administered 2017-05-13: 0.4 mg via ORAL
  Filled 2017-05-13: qty 1

## 2017-05-13 MED ORDER — SODIUM CHLORIDE 0.9 % IV SOLN: INTRAVENOUS | Status: DC

## 2017-05-13 NOTE — H&P (Signed)
TRH H&P    Patient Demographics:    [name] [name], is a 61 y.o. male  MRN: [id number]  DOB - 08/04/1956  Admit Date - 05/13/2017  Referring MD/NP/PA: Dr Wyvonnia Dusky  Outpatient Primary MD for the patient is Scotty Court Zella Richer., MD  Patient coming from: home   Chief Complaint  Patient presents with  . Abdominal Pain      HPI:    [name] [name]  is a 61 y.o. male, With history of nonischemic cardiopathy, chronic systolic heart failure, essential hypertension, atrial fibrillation on anticoagulation with Coumadin came to hospital with left-sided chest pain which started after patient ate vegetables and chicken for lunch. He denies nausea or vomiting. Describes pain is constant and becomes worse on deep breathing. Patient has AICD in place. He denies fever or dysuria. No shortness of breath. In the ED lab work showed sodium 123. Chest x-ray showed no acute abnormality.   Review of systems:      All other systems reviewed and are negative.   With Past History of the following :    Past Medical History:  Diagnosis Date  . Anemia   . Atrial fibrillation ([place])   . AVM (arteriovenous malformation)    Duodenum - nonbleeding and EGD 11/13  . Cardiac arrest ([place]) 1998   ICD implanted at [facility]  . Cardiomyopathy, nonischemic (HCC)    LVEF 35-40%  . Chronic systolic heart failure ([place])   . Essential hypertension, benign   . History of colonic polyps    Colonoscopy 11/13  . Pneumonia    Severe with respiratory failure 10/13      Past Surgical History:  Procedure Laterality Date  . [place]  . COLONOSCOPY  09/26/2012   Procedure: COLONOSCOPY;  Surgeon: Beryle Beams, MD;  Location: [place];  Service: Endoscopy;  Laterality: N/A;  . Defibrillator system revision  04/12/12   MDT Protecta XT VR implanted at [facility] by Dr Deno Etienne with previously implanted system and leads  extracted due to RV lead failure  . ESOPHAGOGASTRODUODENOSCOPY  09/25/2012   Procedure: ESOPHAGOGASTRODUODENOSCOPY (EGD);  Surgeon: Beryle Beams, MD;  Location: [facility] ENDOSCOPY;  Service: Endoscopy;  Laterality: N/A;      Social History:      Social History  Substance Use Topics  . Smoking status: Former Smoker    Packs/day: 0.80    Years: 6.00    Types: Cigarettes    Start date: 05/13/1978    Quit date: 11/15/1996  . Smokeless tobacco: Never Used  . Alcohol use No       Family History :     Family History  Problem Relation Age of Onset  . Diabetes Mother        Died @ 50.  Marland Kitchen Hypertension Father        Alive @ 19.  . Alzheimer's disease Father   . Diabetes Brother        Brother also has htn  . Hypertension Brother       Home Medications:   Prior to  Admission medications   Medication Sig Start Date End Date Taking? Authorizing Provider  carvedilol (COREG) 25 MG tablet Take 1 tablet (25 mg total) by mouth 2 (two) times daily. 11/30/16 02/28/17  Satira Sark, MD  Cholecalciferol (VITAMIN D3) 2000 UNITS capsule Take 2,000 Units by mouth daily.      [provider]  diltiazem (CARDIZEM CD) 180 MG 24 hr capsule Take 1 capsule (180 mg total) by mouth daily. 11/30/16   Satira Sark, MD  finasteride (PROSCAR) 5 MG tablet Take 5 mg by mouth daily.     [provider]  furosemide (LASIX) 80 MG tablet Take 1 tablet (80 mg total) by mouth daily. 11/24/15   Satira Sark, MD  furosemide (LASIX) 80 MG tablet TAKE 1 TABLET (80 MG TOTAL) BY MOUTH DAILY. 12/03/16   Satira Sark, MD  isosorbide mononitrate (IMDUR) 30 MG 24 hr tablet Take 1 tablet (30 mg total) by mouth daily. 09/28/12   Nita Sells, MD  lisinopril (PRINIVIL,ZESTRIL) 5 MG tablet Take 5 mg by mouth daily.    [provider]  polyethylene glycol powder (GLYCOLAX/MIRALAX) powder Take 17 g by mouth daily as needed. For constipation - mix with 8 oz liquid and drink 04/19/12    [provider]  potassium chloride (MICRO-K) 10 MEQ CR capsule Take 1 capsule (10 mEq total) by mouth daily. 02/24/12   de Stanford [place], MD  pravastatin (PRAVACHOL) 40 MG tablet Take 40 mg by mouth at bedtime.     [provider]  tamsulosin (FLOMAX) 0.4 MG CAPS capsule Take 0.4 mg by mouth daily. 07/23/13   [provider]  warfarin (COUMADIN) 2 MG tablet Take 2 mg by mouth daily.    [provider]  warfarin (COUMADIN) 5 MG tablet Take 5 mg by mouth daily. Taking with 3mg  for a total of 8mg     [provider]     Allergies:    No Known Allergies   Physical Exam:   Vitals  Blood pressure 124/68, pulse 82, temperature 98.7 F (37.1 C), temperature source Oral, resp. rate (!) 22, height 6\' 5"  (1.956 m), weight 117.9 kg (260 lb), SpO2 98 %.  1.  General: Appears in no acute distress  2. Psychiatric:  Intact judgement and  insight, awake alert, oriented x 3.  3. Neurologic: No focal neurological deficits, all cranial nerves intact.Strength 5/5 all 4 extremities, sensation intact all 4 extremities, plantars down going.  4. Eyes :  anicteric sclerae, moist conjunctivae with no lid lag. PERRLA.  5. ENMT:  Oropharynx clear with moist mucous membranes and good dentition  6. Neck:  supple, no cervical lymphadenopathy appriciated, No thyromegaly  7. Respiratory : Normal respiratory effort, good air movement bilaterally,clear to  auscultation bilaterally  8. Cardiovascular : RRR, no gallops, rubs or murmurs, no leg edema  9. Gastrointestinal:  Positive bowel sounds, abdomen soft, non-tender to palpation,no hepatosplenomegaly, no rigidity or guarding       10. Skin:  No cyanosis, normal texture and turgor, no rash, lesions or ulcers  11.Musculoskeletal:  Good muscle tone,  joints appear normal , no effusions,  normal range of motion    Data Review:    CBC  Recent Labs Lab 05/13/17 0149  WBC 7.8  HGB 12.8*  HCT 35.9*  PLT 186   MCV 79.1  MCH 28.2  MCHC 35.7  RDW 14.2  LYMPHSABS 1.2  MONOABS 0.8  EOSABS 0.1  BASOSABS 0.0   ------------------------------------------------------------------------------------------------------------------  Chemistries   Recent Labs Lab 05/13/17 0149  NA 123*  K 3.7  CL 91*  CO2 23  GLUCOSE 111*  BUN 8  CREATININE 0.71  CALCIUM 9.3  AST 14*  ALT 15*  ALKPHOS 60  BILITOT 1.4*   ------------------------------------------------------------------------------------------------------------------  ------------------------------------------------------------------------------------------------------------------ GFR: Estimated Creatinine Clearance: 141.5 mL/min (by C-G formula based on SCr of 0.71 mg/dL). Liver Function Tests:  Recent Labs Lab 05/13/17 0149  AST 14*  ALT 15*  ALKPHOS 60  BILITOT 1.4*  PROT 7.9  ALBUMIN 3.7    Recent Labs Lab 05/13/17 0149  LIPASE 22   No results for input(s): AMMONIA in the last 168 hours. Coagulation Profile:  Recent Labs Lab 05/13/17 0149  INR 2.38   Cardiac Enzymes:  Recent Labs Lab 05/13/17 0149  TROPONINI <0.03   BNP (last 3 results) No results for input(s): PROBNP in the last 8760 hours. HbA1C: No results for input(s): HGBA1C in the last 72 hours. CBG: No results for input(s): GLUCAP in the last 168 hours. Lipid Profile: No results for input(s): CHOL, HDL, LDLCALC, TRIG, CHOLHDL, LDLDIRECT in the last 72 hours. Thyroid Function Tests: No results for input(s): TSH, T4TOTAL, FREET4, T3FREE, THYROIDAB in the last 72 hours. Anemia Panel: No results for input(s): VITAMINB12, FOLATE, FERRITIN, TIBC, IRON, RETICCTPCT in the last 72 hours.  --------------------------------------------------------------------------------------------------------------- Urine analysis:    Component Value Date/Time   COLORURINE COLORLESS (A) 05/13/2017 0134   APPEARANCEUR CLEAR 05/13/2017 0134   LABSPEC 1.000 (L)  05/13/2017 0134   PHURINE 6.0 05/13/2017 0134   GLUCOSEU NEGATIVE 05/13/2017 0134   HGBUR SMALL (A) 05/13/2017 0134   BILIRUBINUR NEGATIVE 05/13/2017 0134   KETONESUR NEGATIVE 05/13/2017 0134   PROTEINUR NEGATIVE 05/13/2017 0134   UROBILINOGEN 0.2 09/21/2014 1400   NITRITE NEGATIVE 05/13/2017 0134   LEUKOCYTESUR NEGATIVE 05/13/2017 0134      Imaging Results:    Dg Chest 2 View  Result Date: 05/13/2017 CLINICAL DATA:  Stomach pain after lunch EXAM: CHEST  2 VIEW COMPARISON:  09/23/2012 FINDINGS: Left-sided pacer generator with disconnected leads as before. Tiny pleural effusions. Streaky atelectasis or scarring at the lung bases similar compared to previous. Cardiomegaly. No pneumothorax. IMPRESSION: 1. Suspect tiny pleural effusions 2. Streaky bibasilar atelectasis or scar 3. Cardiomegaly Electronically Signed   By: Donavan Foil M.D.   On: 05/13/2017 04:00    My personal review of EKG: Rhythm atrial fibrillation   Assessment & Plan:    Active Problems:   Atrial fibrillation (HCC)   Chronic systolic heart failure (HCC)   Implantable cardioverter-defibrillator (ICD) in situ   Essential hypertension, benign   Hyponatremia   1. Chest pain-appears atypical ,place under observation, obtain serial cardiac enzymes. 2. Hyponatremia- will hold Lasix, start for restriction 12 00 mL per day. Check serum osmolality. Check BMP at 9 AM today. 3. Chronic systolic heart failure-compensated at this time, Lasix currently hold due to above. Consider restarting Lasix if sodium improves by tomorrow. 4. Atrial fibrillation-heart rate is controlled, continue Cardizem. Coumadin started per pharmacy consultation.   DVT Prophylaxis-   coumadin  AM Labs Ordered, also please review Full Orders  Family Communication: Admission, patients condition and plan of care including tests being ordered have been discussed with the patient  who indicate understanding and agree with the plan and Code  Status.  Code Status:  Full code  Admission status: Observation    Time spent in minutes : 60 minutes   LAMA,GAGAN S M.D on 05/13/2017 at 5:21 AM  Between  7am to 7pm - Pager - [phone]. After 7pm go to [email or web address] - password [facility]  [facility] - Office  [phone]

## 2017-05-13 NOTE — ED Triage Notes (Signed)
Pt states his stomach has been hurting since he ate chicken for lunch today.  Pt denies nausea, vomiting or diarrhea, states he took some mag citrate thinking he could get his bowels to move.

## 2017-05-13 NOTE — ED Provider Notes (Signed)
[facility] DEPT Provider Note   CSN: [id number] Arrival date & time: 05/13/17  0005     History   Chief Complaint Chief Complaint  Patient presents with  . Abdominal Pain    HPI [name] [name] is a 61 y.o. male.  Patient is difficult historian. Presents with what is actually left-sided chest pain that started around noon after eating some chicken with vegetables for lunch. He denies any nausea vomiting, diarrhea. He took some magnesium citrate to try to have a bowel movement which didn't help. He had one episode of dry heaving. Reports the pain is constant and worse when he takes a deep breath in. He denies any abdominal pain. Patient denies any fever or chills. No urinary symptoms. Patient has history of atrial fibrillation on Coumadin. 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 Denies any Diaphoresis, syncope, shortness of breath or nausea.   The history is provided by the patient.  Abdominal Pain   Pertinent negatives include fever, diarrhea, nausea, vomiting, dysuria, hematuria, headaches, arthralgias and myalgias.    Past Medical History:  Diagnosis Date  . Anemia   . Atrial fibrillation ([place])   . AVM (arteriovenous malformation)    Duodenum - nonbleeding and EGD 11/13  . Cardiac arrest ([place]) 1998   ICD implanted at [facility]  . Cardiomyopathy, nonischemic (HCC)    LVEF 35-40%  . Chronic systolic heart failure ([place])   . Essential hypertension, benign   . History of colonic polyps    Colonoscopy 11/13  . Pneumonia    Severe with respiratory failure 10/13    Patient Active Problem List   Diagnosis Date Noted  . CKD (chronic kidney disease) stage 2, GFR 60-89 ml/min 09/14/2012  . Cardiomyopathy, nonischemic ([place])   . Essential hypertension, benign   . IMPLANTATION OF DEFIBRILLATOR, HX OF 11/10/2009  . Atrial fibrillation ([place]) 07/29/2009  . Chronic systolic heart failure ([place]) 07/29/2009    Past Surgical History:    Procedure Laterality Date  . [place]  . COLONOSCOPY  09/26/2012   Procedure: COLONOSCOPY;  Surgeon: Beryle Beams, MD;  Location: [place];  Service: Endoscopy;  Laterality: N/A;  . Defibrillator system revision  04/12/12   MDT Protecta XT VR implanted at [facility] by Dr Deno Etienne with previously implanted system and leads extracted due to RV lead failure  . ESOPHAGOGASTRODUODENOSCOPY  09/25/2012   Procedure: ESOPHAGOGASTRODUODENOSCOPY (EGD);  Surgeon: Beryle Beams, MD;  Location: [facility] ENDOSCOPY;  Service: Endoscopy;  Laterality: N/A;       Home Medications    Prior to Admission medications   Medication Sig Start Date End Date Taking? Authorizing Provider  carvedilol (COREG) 25 MG tablet Take 1 tablet (25 mg total) by mouth 2 (two) times daily. 11/30/16 02/28/17  Satira Sark, MD  Cholecalciferol (VITAMIN D3) 2000 UNITS capsule Take 2,000 Units by mouth daily.      [provider]  diltiazem (CARDIZEM CD) 180 MG 24 hr capsule Take 1 capsule (180 mg total) by mouth daily. 11/30/16   Satira Sark, MD  finasteride (PROSCAR) 5 MG tablet Take 5 mg by mouth daily.     [provider]  furosemide (LASIX) 80 MG tablet Take 1 tablet (80 mg total) by mouth daily. 11/24/15   Satira Sark, MD  furosemide (LASIX) 80 MG tablet TAKE 1 TABLET (80 MG TOTAL) BY MOUTH DAILY. 12/03/16   Satira Sark, MD  isosorbide mononitrate (IMDUR) 30 MG 24  hr tablet Take 1 tablet (30 mg total) by mouth daily. 09/28/12   Nita Sells, MD  lisinopril (PRINIVIL,ZESTRIL) 5 MG tablet Take 5 mg by mouth daily.    [provider]  polyethylene glycol powder (GLYCOLAX/MIRALAX) powder Take 17 g by mouth daily as needed. For constipation - mix with 8 oz liquid and drink 04/19/12   [provider]  potassium chloride (MICRO-K) 10 MEQ CR capsule Take 1 capsule (10 mEq total) by mouth daily. 02/24/12   de Stanford Scotland, MD  pravastatin (PRAVACHOL)  40 MG tablet Take 40 mg by mouth at bedtime.     [provider]  tamsulosin (FLOMAX) 0.4 MG CAPS capsule Take 0.4 mg by mouth daily. 07/23/13   [provider]  warfarin (COUMADIN) 2 MG tablet Take 2 mg by mouth daily.    [provider]  warfarin (COUMADIN) 5 MG tablet Take 5 mg by mouth daily. Taking with 3mg  for a total of 8mg     [provider]    Family History Family History  Problem Relation Age of Onset  . Diabetes Mother        Died @ 41.  Marland Kitchen Hypertension Father        Alive @ 33.  . Alzheimer's disease Father   . Diabetes Brother        Brother also has htn  . Hypertension Brother     Social History Social History  Substance Use Topics  . Smoking status: Former Smoker    Packs/day: 0.80    Years: 6.00    Types: Cigarettes    Start date: 05/13/1978    Quit date: 11/15/1996  . Smokeless tobacco: Never Used  . Alcohol use No     Allergies   Patient has no known allergies.   Review of Systems Review of Systems  Constitutional: Negative for activity change, appetite change and fever.  HENT: Negative for congestion and rhinorrhea.   Eyes: Negative for visual disturbance.  Respiratory: Positive for chest tightness. Negative for shortness of breath.   Cardiovascular: Positive for chest pain.  Gastrointestinal: Positive for abdominal pain. Negative for diarrhea, nausea and vomiting.  Genitourinary: Negative for dysuria, hematuria, testicular pain and urgency.  Musculoskeletal: Negative for arthralgias, back pain and myalgias.  Neurological: Negative for dizziness, weakness, light-headedness and headaches.  Hematological: Negative for adenopathy.    all other systems are negative except as noted in the HPI and PMH.    Physical Exam Updated Vital Signs BP 123/61 (BP Location: Right Arm)   Pulse 83   Temp 98.7 F (37.1 C) (Oral)   Resp 16   Ht 6\' 5"  (1.956 m)   Wt 117.9 kg (260 lb)   SpO2 98%   BMI 30.83 kg/m   Physical  Exam  Constitutional: He is oriented to person, place, and time. He appears well-developed and well-nourished. No distress.  HENT:  Head: Normocephalic and atraumatic.  Mouth/Throat: Oropharynx is clear and moist. No oropharyngeal exudate.  Eyes: Conjunctivae and EOM are normal. Pupils are equal, round, and reactive to light.  Neck: Normal range of motion. Neck supple.  No meningismus.  Cardiovascular: Normal rate, regular rhythm, normal heart sounds and intact distal pulses.   No murmur heard. AICD L chest  Pulmonary/Chest: Effort normal and breath sounds normal. No respiratory distress. He exhibits no tenderness.  Abdominal: Soft. There is tenderness. There is no rebound and no guarding.  Mild LUQ tenderness  Musculoskeletal: Normal range of motion. He exhibits no edema  or tenderness.  Neurological: He is alert and oriented to person, place, and time. No cranial nerve deficit. He exhibits normal muscle tone. Coordination normal.   5/5 strength throughout. CN 2-12 intact.Equal grip strength.   Skin: Skin is warm.  Psychiatric: He has a normal mood and affect. His behavior is normal.  Nursing note and vitals reviewed.    ED Treatments / Results  Labs (all labs ordered are listed, but only abnormal results are displayed) Labs Reviewed  CBC WITH DIFFERENTIAL/PLATELET - Abnormal; Notable for the following:       Result Value   Hemoglobin 12.8 (*)    HCT 35.9 (*)    All other components within normal limits  COMPREHENSIVE METABOLIC PANEL - Abnormal; Notable for the following:    Sodium 123 (*)    Chloride 91 (*)    Glucose, Bld 111 (*)    AST 14 (*)    ALT 15 (*)    Total Bilirubin 1.4 (*)    All other components within normal limits  URINALYSIS, ROUTINE W REFLEX MICROSCOPIC - Abnormal; Notable for the following:    Color, Urine COLORLESS (*)    Specific Gravity, Urine 1.000 (*)    Hgb urine dipstick SMALL (*)    All other components within normal limits  PROTIME-INR -  Abnormal; Notable for the following:    Prothrombin Time 26.4 (*)    All other components within normal limits  LIPASE, BLOOD  D-DIMER, QUANTITATIVE (NOT AT [facility])  TROPONIN I    EKG  EKG Interpretation  Date/Time:  Friday May 13 2017 02:15:14 EDT Ventricular Rate:  79 PR Interval:    QRS Duration: 110 QT Interval:  403 QTC Calculation: 462 R Axis:   87 Text Interpretation:  Atrial fibrillation No significant change was found Confirmed by Ezequiel Essex [phone]) on 05/13/2017 2:50:58 AM       Radiology Dg Chest 2 View  Result Date: 05/13/2017 CLINICAL DATA:  Stomach pain after lunch EXAM: CHEST  2 VIEW COMPARISON:  09/23/2012 FINDINGS: Left-sided pacer generator with disconnected leads as before. Tiny pleural effusions. Streaky atelectasis or scarring at the lung bases similar compared to previous. Cardiomegaly. No pneumothorax. IMPRESSION: 1. Suspect tiny pleural effusions 2. Streaky bibasilar atelectasis or scar 3. Cardiomegaly Electronically Signed   By: Donavan Foil M.D.   On: 05/13/2017 04:00    Procedures Procedures (including critical care time)  Medications Ordered in ED Medications - No data to display   Initial Impression / Assessment and Plan / ED Course  I have reviewed the triage vital signs and the nursing notes.  Pertinent labs & imaging results that were available during my care of the patient were reviewed by me and considered in my medical decision making (see chart for details).    Patient with left-sided chest pain that has been constant. EKG is atrial fibrillation without acute ST changes.  INR is therapeutic. D-dimer is negative Patient found to have incidental hyponatremia of 123.  Chest x-rays negative. Troponin negative after more than 12 hours of ongoing pain. Doubt ACS. Abdomen soft and benign. Low suspicion for ACS or pulmonary embolism.  Patient's incidental hyponatremia is likely secondary to overdiuresis. This appears to be new. Will  need admission for correction of this to prevent seizures. Discussed with Dr. Darrick Meigs.  Final Clinical Impressions(s) / ED Diagnoses   Final diagnoses:  Hyponatremia  Chest pain, unspecified type    New Prescriptions New Prescriptions   No medications on file  Ezequiel Essex, MD 05/13/17 [phone]

## 2017-05-13 NOTE — Progress Notes (Signed)
Discharge instructions read to patient. Patient verbalized understanding of all instructions. Discharged to home with friend. 

## 2017-05-13 NOTE — Discharge Summary (Signed)
Physician Discharge Summary  [name] [name] PPI:[id number] DOB: 03-18-1956 DOA: 05/13/2017  PCP: Deloria Lair., MD  Admit date: 05/13/2017 Discharge date: 05/13/2017  Admitted From: home  Disposition:  home  Recommendations for Outpatient Follow-up:  1. Follow up with Dr. Domenic Polite at already scheduled appointment next month 2. No medication changes were made  Home Health:  none  Equipment/Devices:  None  Discharge Condition:  Stable, improved CODE STATUS:  full  Diet recommendation:  Healthy heart   Brief/Interim Summary:  The patient is a 61 year old male with history of nonischemic cardiomyopathy/chronic systolic heart failure, essential hypertension, atrial fibrillation on Coumadin who presented to the hospital with left upper quadrant versus left-sided chest pain which started after he ate lunch. He denied associated nausea, vomiting. His pain worsened with deep inspiration and was constant. He tried drinking some "clear liquid stuff" for gas or heartburn which did not help and so he eventually came to the emergency department after several hours. In the emergency department, his initial story DM was 123. Troponin was negative, EKG unchanged from prior. His chest x-ray showed no acute abnormality. He was observed on telemetry which demonstrated rate controlled atrial fibrillation with occasional PVCs. His subsequent troponins were negative and his pain eventually resolved. I suspect that his initial sodium level was spurious. His repeat sodium level was 130mg /dl.  he was able to tolerate a healthy heart diet prior to discharge and felt ready to go home. He has a follow-up appointment with cardiology in a few weeks.  Discharge Diagnoses:  Principal Problem:   Hyponatremia Active Problems:   Atrial fibrillation (HCC)   Chronic systolic heart failure (HCC)   Implantable cardioverter-defibrillator (ICD) in situ   Essential hypertension, benign   Gas pain  Chest vs. LUQ abdominal  pain, resolved.  Do not suspect ACS due to the atypical nature of his symptoms, negative troponins.  This is likely related to some indigestion or gas.  LFTs, lipase were wnl.  UA negative.      Hyponatremia, he was given 500 mL of IV fluids in the emergency department. Repeat sodium level was 130.  I suspect that his initial sodium level was spurious. Mild hyponatremia is expected in the setting of severe chronic systolic heart failure. Because his hyponatremia was mild to moderate, he is at low risk for developing central pontine myelolysis.    Chronic systolic heart failure, appears euvolemic other than some faint rales at the right base and patient was asymptomatic.  No changes were made to his heart failure medications.  He is on ACE inhibitor, beta blocker, Imdur.  Chronic atrial fibrillation, CHADs2vasc = 2 (CHF, HTN), needs anticoagulation.  INR 2.38 on admission, no changes were made to his warfarin.  Rate controlled on beta blocker.  BPH, stable, continue flomax and finasteride.  Discharge Instructions  Discharge Instructions    (HEART FAILURE PATIENTS) Call MD:  Anytime you have any of the following symptoms: 1) 3 pound weight gain in 24 hours or 5 pounds in 1 week 2) shortness of breath, with or without a dry hacking cough 3) swelling in the hands, feet or stomach 4) if you have to sleep on extra pillows at night in order to breathe.    Complete by:  As directed    Call MD for:  difficulty breathing, headache or visual disturbances    Complete by:  As directed    Call MD for:  extreme fatigue    Complete by:  As directed  Call MD for:  hives    Complete by:  As directed    Call MD for:  persistant dizziness or light-headedness    Complete by:  As directed    Call MD for:  persistant nausea and vomiting    Complete by:  As directed    Call MD for:  severe uncontrolled pain    Complete by:  As directed    Call MD for:  temperature >100.4    Complete by:  As directed    Diet -  low sodium heart healthy    Complete by:  As directed    Increase activity slowly    Complete by:  As directed        Medication List    TAKE these medications   carvedilol 25 MG tablet Commonly known as:  COREG Take 1 tablet (25 mg total) by mouth 2 (two) times daily.   diltiazem 180 MG 24 hr capsule Commonly known as:  CARDIZEM CD Take 1 capsule (180 mg total) by mouth daily.   finasteride 5 MG tablet Commonly known as:  PROSCAR Take 5 mg by mouth daily.   furosemide 80 MG tablet Commonly known as:  LASIX TAKE 1 TABLET (80 MG TOTAL) BY MOUTH DAILY.   isosorbide mononitrate 30 MG 24 hr tablet Commonly known as:  IMDUR Take 1 tablet (30 mg total) by mouth daily.   lisinopril 5 MG tablet Commonly known as:  PRINIVIL,ZESTRIL Take 5 mg by mouth daily.   polyethylene glycol powder powder Commonly known as:  GLYCOLAX/MIRALAX Take 17 g by mouth daily as needed. For constipation - mix with 8 oz liquid and drink   potassium chloride 10 MEQ CR capsule Commonly known as:  MICRO-K Take 1 capsule (10 mEq total) by mouth daily.   pravastatin 40 MG tablet Commonly known as:  PRAVACHOL Take 40 mg by mouth at bedtime.   tamsulosin 0.4 MG Caps capsule Commonly known as:  FLOMAX Take 0.4 mg by mouth daily.   Vitamin D3 2000 units capsule Take 2,000 Units by mouth daily.   warfarin 5 MG tablet Commonly known as:  COUMADIN Take 5 mg by mouth daily. Taking with 3mg  for a total of 7mg    warfarin 2 MG tablet Commonly known as:  COUMADIN Take 2 mg by mouth daily.      Follow-up Information    Deloria Lair., MD Follow up.   Specialty:  Family Medicine Why:  as needed Contact information: [place] [address] [phone]        Satira Sark, MD. Schedule an appointment as soon as possible for a visit on 06/08/2017.   Specialty:  Cardiology Contact information: [place] [address] [phone]          No Known  Allergies  Consultations: none  Procedures/Studies: Dg Chest 2 View  Result Date: 05/13/2017 CLINICAL DATA:  Stomach pain after lunch EXAM: CHEST  2 VIEW COMPARISON:  09/23/2012 FINDINGS: Left-sided pacer generator with disconnected leads as before. Tiny pleural effusions. Streaky atelectasis or scarring at the lung bases similar compared to previous. Cardiomegaly. No pneumothorax. IMPRESSION: 1. Suspect tiny pleural effusions 2. Streaky bibasilar atelectasis or scar 3. Cardiomegaly Electronically Signed   By: Donavan Foil M.D.   On: 05/13/2017 04:00     Subjective: Feeling much better.  Denies SOB.  LUQ pain has improved/resolved.  Tolerating a diet.  Denies LEE, nausea, diaphoresis, lightheadedness.  Discharge Exam: Vitals:   05/13/17 0430 05/13/17 0639  BP: 124/68 131/68  Pulse: 82 (!) 56  Resp: (!) 22 20  Temp:  98.4 F (36.9 C)   Vitals:   05/13/17 0400 05/13/17 0430 05/13/17 0639 05/13/17 0750  BP: 127/71 124/68 131/68   Pulse: 76 82 (!) 56   Resp: (!) 24 (!) 22 20   Temp:   98.4 F (36.9 C)   TempSrc:   Oral   SpO2: 98% 98% 98% 94%  Weight:      Height:   6\' 4"  (1.93 m)     General: Pt is alert, awake, not in acute distress Cardiovascular: RRR, S1/S2 +, no rubs, no gallops Respiratory: Rales at the deep right base, otherwise clear, no wheezes or rhonchi Abdominal: normal active bowel sounds, soft, nondistended, mild TTP in the LUQ without rebound or guarding.   Extremities: no edema, no cyanosis    The results of significant diagnostics from this hospitalization (including imaging, microbiology, ancillary and laboratory) are listed below for reference.     Microbiology: No results found for this or any previous visit (from the past 240 hour(s)).   Labs: BNP (last 3 results) No results for input(s): BNP in the last 8760 hours. Basic Metabolic Panel:  Recent Labs Lab 05/13/17 0149 05/13/17 0659  NA 123* 130*  K 3.7 4.0  CL 91* 96*  CO2 23 27   GLUCOSE 111* 121*  BUN 8 8  CREATININE 0.71 0.77  CALCIUM 9.3 9.4   Liver Function Tests:  Recent Labs Lab 05/13/17 0149  AST 14*  ALT 15*  ALKPHOS 60  BILITOT 1.4*  PROT 7.9  ALBUMIN 3.7    Recent Labs Lab 05/13/17 0149  LIPASE 22   No results for input(s): AMMONIA in the last 168 hours. CBC:  Recent Labs Lab 05/13/17 0149  WBC 7.8  NEUTROABS 5.8  HGB 12.8*  HCT 35.9*  MCV 79.1  PLT 186   Cardiac Enzymes:  Recent Labs Lab 05/13/17 0149 05/13/17 0659 05/13/17 1241  TROPONINI <0.03 <0.03 <0.03   BNP: Invalid input(s): POCBNP CBG: No results for input(s): GLUCAP in the last 168 hours. D-Dimer  Recent Labs  05/13/17 0149  DDIMER 0.40   Hgb A1c No results for input(s): HGBA1C in the last 72 hours. Lipid Profile No results for input(s): CHOL, HDL, LDLCALC, TRIG, CHOLHDL, LDLDIRECT in the last 72 hours. Thyroid function studies  Recent Labs  05/13/17 0659  TSH 2.152   Anemia work up No results for input(s): VITAMINB12, FOLATE, FERRITIN, TIBC, IRON, RETICCTPCT in the last 72 hours. Urinalysis    Component Value Date/Time   COLORURINE COLORLESS (A) 05/13/2017 0134   APPEARANCEUR CLEAR 05/13/2017 0134   LABSPEC 1.000 (L) 05/13/2017 0134   PHURINE 6.0 05/13/2017 0134   GLUCOSEU NEGATIVE 05/13/2017 0134   HGBUR SMALL (A) 05/13/2017 0134   BILIRUBINUR NEGATIVE 05/13/2017 0134   KETONESUR NEGATIVE 05/13/2017 0134   PROTEINUR NEGATIVE 05/13/2017 0134   UROBILINOGEN 0.2 09/21/2014 1400   NITRITE NEGATIVE 05/13/2017 0134   LEUKOCYTESUR NEGATIVE 05/13/2017 0134   Sepsis Labs Invalid input(s): PROCALCITONIN,  WBC,  LACTICIDVEN   Time coordinating discharge: Over 30 minutes  SIGNED:   Janece Canterbury, MD  [facility] 05/13/2017, 2:08 PM Pager   If 7PM-7AM, please contact night-coverage [email or web address] Password [email or web address]

## 2017-05-13 NOTE — Progress Notes (Signed)
ANTICOAGULATION CONSULT NOTE - Initial Consult  Pharmacy Consult for coumadin Indication: atrial fibrillation  No Known Allergies  Patient Measurements: Height: 6\' 4"  (193 cm) Weight: 260 lb (117.9 kg) IBW/kg (Calculated) : 86.8  Vital Signs: Temp: 98.4 F (36.9 C) (06/29 0639) Temp Source: Oral (06/29 0639) BP: 131/68 (06/29 0639) Pulse Rate: 56 (06/29 0639)  Labs:  Recent Labs  05/13/17 0149 05/13/17 0659  HGB 12.8*  --   HCT 35.9*  --   PLT 186  --   LABPROT 26.4*  --   INR 2.38  --   CREATININE 0.71 0.77  TROPONINI <0.03 <0.03    Estimated Creatinine Clearance: 139.5 mL/min (by C-G formula based on SCr of 0.77 mg/dL).   Medical History: Past Medical History:  Diagnosis Date  . Anemia   . Atrial fibrillation ([place])   . AVM (arteriovenous malformation)    Duodenum - nonbleeding and EGD 11/13  . Cardiac arrest ([place]) 1998   ICD implanted at [facility]  . Cardiomyopathy, nonischemic (HCC)    LVEF 35-40%  . Chronic systolic heart failure ([place])   . Essential hypertension, benign   . History of colonic polyps    Colonoscopy 11/13  . Pneumonia    Severe with respiratory failure 10/13    Medications:  Prescriptions Prior to Admission  Medication Sig Dispense Refill Last Dose  . carvedilol (COREG) 25 MG tablet Take 1 tablet (25 mg total) by mouth 2 (two) times daily. 180 tablet 3   . Cholecalciferol (VITAMIN D3) 2000 UNITS capsule Take 2,000 Units by mouth daily.     Taking  . diltiazem (CARDIZEM CD) 180 MG 24 hr capsule Take 1 capsule (180 mg total) by mouth daily. 90 capsule 3   . finasteride (PROSCAR) 5 MG tablet Take 5 mg by mouth daily.    Taking  . furosemide (LASIX) 80 MG tablet Take 1 tablet (80 mg total) by mouth daily. 90 tablet 3 Taking  . furosemide (LASIX) 80 MG tablet TAKE 1 TABLET (80 MG TOTAL) BY MOUTH DAILY. 90 tablet 3   . isosorbide mononitrate (IMDUR) 30 MG 24 hr tablet Take 1 tablet (30 mg total) by mouth daily.   Taking  . lisinopril  (PRINIVIL,ZESTRIL) 5 MG tablet Take 5 mg by mouth daily.   Taking  . polyethylene glycol powder (GLYCOLAX/MIRALAX) powder Take 17 g by mouth daily as needed. For constipation - mix with 8 oz liquid and drink   Taking  . potassium chloride (MICRO-K) 10 MEQ CR capsule Take 1 capsule (10 mEq total) by mouth daily. 90 capsule 3 Taking  . pravastatin (PRAVACHOL) 40 MG tablet Take 40 mg by mouth at bedtime.    Taking  . tamsulosin (FLOMAX) 0.4 MG CAPS capsule Take 0.4 mg by mouth daily.   Taking  . warfarin (COUMADIN) 2 MG tablet Take 2 mg by mouth daily.   Taking  . warfarin (COUMADIN) 5 MG tablet Take 5 mg by mouth daily. Taking with 3mg  for a total of 8mg    Taking    Assessment: 61 y.o. male, With history of nonischemic cardiopathy, chronic systolic heart failure, essential hypertension, atrial fibrillation on anticoagulation with Coumadin came to hospital with left-sided chest pain. Pharmacy asked to dose Coumadin. Patient takes Coumadin 7mg  daily. INR therapeutic  Goal of Therapy:  INR 2-3 Monitor platelets by anticoagulation protocol: Yes   Plan:  Coumadin 7mg  today PT-INR daily Monitor for S/S of bleeding  Isac Sarna, BS Vena [place], BCPS Clinical Pharmacist Pager [phone] 05/13/2017,10:23  AM

## 2017-05-13 NOTE — Care Management Obs Status (Signed)
[facility] NOTIFICATION   Patient Details  Name: DALTYN DEGROAT MRN: [id number] Date of Birth: [birth year]   Medicare Observation Status Notification Given:  Yes    Sherald Barge, RN 05/13/2017, 2:34 PM

## 2017-05-14 LAB — HIV ANTIBODY (ROUTINE TESTING W REFLEX): HIV Screen 4th Generation wRfx: NONREACTIVE

## 2017-06-07 ENCOUNTER — Ambulatory Visit: Payer: Medicare HMO | Admitting: Cardiology

## 2017-06-07 ENCOUNTER — Ambulatory Visit (INDEPENDENT_AMBULATORY_CARE_PROVIDER_SITE_OTHER): Payer: Medicare HMO

## 2017-06-07 DIAGNOSIS — I5022 Chronic systolic (congestive) heart failure: Secondary | ICD-10-CM

## 2017-06-07 DIAGNOSIS — Z95810 Presence of automatic (implantable) cardiac defibrillator: Secondary | ICD-10-CM

## 2017-06-07 NOTE — Progress Notes (Signed)
EPIC Encounter for ICM Monitoring  Patient Name: [name] [name] is a 60 y.o. male Date: 06/07/2017 Primary Care Physican: Deloria Lair., MD Primary Cardiologist:[place] Electrophysiologist: Allred Dry EXNTZG:017 lbs      Heart Failure questions reviewed, pt asymptomatic.   Thoracic impedance normal.  Prescribed dosage: Furosemide 80 mg 1 tablet daily  Recommendations: No changes.   Encouraged to call for fluid symptoms.  Follow-up plan: ICM clinic phone appointment on 07/19/2017.  Office appointment scheduled 06/08/2017 with Dr. Domenic Polite and 06/17/2017 with Dr Rayann Heman.  Copy of ICM check sent to device physician.   3 month ICM trend: 06/07/2017   1 Year ICM trend:      Rosalene Billings, RN 06/07/2017 12:14 PM

## 2017-06-07 NOTE — Progress Notes (Signed)
Cardiology Office Note  Date: 06/08/2017   ID: SUNNY GAINS, DOB 08-27-1956, MRN [id number]  PCP: Deloria Lair., MD  Primary Cardiologist: Rozann Lesches, MD   Chief Complaint  Patient presents with  . Atrial Fibrillation  . Cardiomyopathy    History of Present Illness: [name] [name] is a 61 y.o. male last seen in January. Records indicate hospitalization in June with chest/left upper quadrant discomfort atypical for ACS with negative cardiac enzymes. This was felt to be potentially GI related based on discharge summary. He presents today for follow-up, denies any recurrent symptoms. He states that he has been trying to lose some weight by cutting back portion size, has been somewhat successful.  He remains on Coumadin with follow-up per Dr. Scotty Court. He reports no bleeding problems.  He continues to follow with Dr. Rayann Heman in the device clinic, Medtronic ICD in place. Recent device check showed normal thoracic impedance. He denies orthopnea, PND, leg edema.  Most recent echocardiogram from December 2017 is outlined below. LVEF was up to the range of 40-45%.  Past Medical History:  Diagnosis Date  . Anemia   . Atrial fibrillation ([place])   . AVM (arteriovenous malformation)    Duodenum - nonbleeding and EGD 11/13  . Cardiac arrest ([place]) 1998   ICD implanted at [facility]  . Cardiomyopathy, nonischemic (HCC)    LVEF 35-40%  . Chronic systolic heart failure ([place])   . Essential hypertension, benign   . History of colonic polyps    Colonoscopy 11/13  . Pneumonia    Severe with respiratory failure 10/13    Past Surgical History:  Procedure Laterality Date  . [place]  . COLONOSCOPY  09/26/2012   Procedure: COLONOSCOPY;  Surgeon: Beryle Beams, MD;  Location: [place];  Service: Endoscopy;  Laterality: N/A;  . Defibrillator system revision  04/12/12   MDT Protecta XT VR implanted at [facility] by Dr Deno Etienne with previously implanted system  and leads extracted due to RV lead failure  . ESOPHAGOGASTRODUODENOSCOPY  09/25/2012   Procedure: ESOPHAGOGASTRODUODENOSCOPY (EGD);  Surgeon: Beryle Beams, MD;  Location: [facility] ENDOSCOPY;  Service: Endoscopy;  Laterality: N/A;    Current Outpatient Prescriptions  Medication Sig Dispense Refill  . carvedilol (COREG) 25 MG tablet Take 1 tablet (25 mg total) by mouth 2 (two) times daily. 180 tablet 3  . Cholecalciferol (VITAMIN D3) 2000 UNITS capsule Take 2,000 Units by mouth daily.      Marland Kitchen diltiazem (CARDIZEM CD) 180 MG 24 hr capsule Take 1 capsule (180 mg total) by mouth daily. 90 capsule 3  . finasteride (PROSCAR) 5 MG tablet Take 5 mg by mouth daily.     . furosemide (LASIX) 80 MG tablet TAKE 1 TABLET (80 MG TOTAL) BY MOUTH DAILY. 90 tablet 3  . isosorbide mononitrate (IMDUR) 30 MG 24 hr tablet Take 1 tablet (30 mg total) by mouth daily.    Marland Kitchen lisinopril (PRINIVIL,ZESTRIL) 5 MG tablet Take 5 mg by mouth daily.    . polyethylene glycol powder (GLYCOLAX/MIRALAX) powder Take 17 g by mouth daily as needed. For constipation - mix with 8 oz liquid and drink    . potassium chloride (MICRO-K) 10 MEQ CR capsule Take 1 capsule (10 mEq total) by mouth daily. 90 capsule 3  . pravastatin (PRAVACHOL) 40 MG tablet Take 40 mg by mouth at bedtime.     . tamsulosin (FLOMAX) 0.4 MG CAPS capsule Take 0.4 mg by mouth daily.    Marland Kitchen  warfarin (COUMADIN) 2 MG tablet Take 2 mg by mouth daily.    Marland Kitchen warfarin (COUMADIN) 5 MG tablet Take 5 mg by mouth daily. Taking with 3mg  for a total of 7mg      No current facility-administered medications for this visit.    Allergies:  Patient has no known allergies.   Social History: The patient  reports that he quit smoking about 20 years ago. His smoking use included Cigarettes. He started smoking about 39 years ago. He has a 4.80 pack-year smoking history. He has never used smokeless tobacco. He reports that he uses drugs, including Marijuana. He reports that he does not drink alcohol.    ROS:  Please see the history of present illness. Otherwise, complete review of systems is positive for none.  All other systems are reviewed and negative.   Physical Exam: VS:  BP 109/74   Pulse 89   Ht 6\' 5"  (1.956 m)   Wt 253 lb 9.6 oz (115 kg)   BMI 30.07 kg/m , BMI Body mass index is 30.07 kg/m.  Wt Readings from Last 3 Encounters:  06/08/17 253 lb 9.6 oz (115 kg)  05/13/17 260 lb (117.9 kg)  11/30/16 282 lb (127.9 kg)    Comfortable. Tall stature. Obese.  HEENT: Conjunctiva and lids normal, oropharynx clear.  Neck: Supple, no elevated JVP or carotid bruits, no thyromegaly.  Lungs: Course but clear to auscultation, nonlabored breathing at rest.  Cardiac: Irregularly irregular, no S3 or significant systolic murmur, indistinct PMI.  Abdomen: Soft, nontender, bowel sounds present.  Extremities: No pitting edema, distal pulses 1-2+.  Skin: Warm and dry. Musculoskeletal: No kyphosis. Neuropsychiatric: Alert and oriented 3, affect appropriate.  ECG: I personally reviewed the tracing from 05/13/2017 which showed rate-controlled atrial fibrillation with PVCs versus aberrantly conducted complexes.  Recent Labwork: 05/13/2017: ALT 15; AST 14; BUN 8; Creatinine, Ser 0.77; Hemoglobin 12.8; Platelets 186; Potassium 4.0; Sodium 130; TSH 2.152   Other Studies Reviewed Today:  Echocardiogram 10/21/2016: Study Conclusions  - Left ventricle: The cavity size was normal. Wall thickness was   increased in a pattern of mild LVH. Systolic function was mildly   to moderately reduced. The estimated ejection fraction was in the   range of 40% to 45%. Diffuse hypokinesis. Findings consistent   with left ventricular diastolic dysfunction. Doppler parameters   are consistent with high ventricular filling pressure. - Mitral valve: Mildly thickened leaflets . There was mild   regurgitation. - Left atrium: The atrium was mildly dilated. - Right ventricle: The cavity size was mildly  dilated. Pacer wire   or catheter noted in right ventricle. - Right atrium: The atrium was moderately dilated. Pacer wire or   catheter noted in right atrium. - Tricuspid valve: There was moderate regurgitation. - Systemic veins: IVC dilated with normal respiratory variation.   Estimated CVP 8 mmHg. - Pericardium, extracardiac: A trivial pericardial effusion was   identified. Features were not consistent with tamponade   physiology.  Assessment and Plan:  1. Nonischemic cardiomyopathy, LVEF 40-45% range at this point. He has tolerated the adjustments made in medications at last visit, continue same for now. No evidence of fluid overload.  2. Chronic atrial fibrillation, continue strategy of heart rate control and anticoagulation. He remains on Coumadin with follow-up per Dr. Scotty Court.  3. Medtronic ICD in place, followed in the device clinic with Dr. Rayann Heman. No device shocks. Thoracic impedance normal.  4. Essential hypertension, blood pressure normal today.  Current medicines were reviewed with the  patient today.  Disposition: Follow-up in 6 months.  Signed, Satira Sark, MD, [facility] 06/08/2017 8:57 AM    [facility] [place], [place], [place] [address] Phone: [phone]; Fax: [phone]

## 2017-06-08 ENCOUNTER — Ambulatory Visit (INDEPENDENT_AMBULATORY_CARE_PROVIDER_SITE_OTHER): Payer: Medicare HMO | Admitting: Cardiology

## 2017-06-08 ENCOUNTER — Encounter: Payer: Self-pay | Admitting: Cardiology

## 2017-06-08 VITALS — BP 109/74 | HR 89 | Ht 77.0 in | Wt 253.6 lb

## 2017-06-08 DIAGNOSIS — I482 Chronic atrial fibrillation, unspecified: Secondary | ICD-10-CM

## 2017-06-08 DIAGNOSIS — I5022 Chronic systolic (congestive) heart failure: Secondary | ICD-10-CM

## 2017-06-08 DIAGNOSIS — Z95810 Presence of automatic (implantable) cardiac defibrillator: Secondary | ICD-10-CM

## 2017-06-08 DIAGNOSIS — I428 Other cardiomyopathies: Secondary | ICD-10-CM

## 2017-06-08 DIAGNOSIS — I10 Essential (primary) hypertension: Secondary | ICD-10-CM

## 2017-06-08 NOTE — Patient Instructions (Signed)

## 2017-06-17 ENCOUNTER — Ambulatory Visit (INDEPENDENT_AMBULATORY_CARE_PROVIDER_SITE_OTHER): Payer: Medicare HMO | Admitting: Internal Medicine

## 2017-06-17 ENCOUNTER — Encounter: Payer: Self-pay | Admitting: Internal Medicine

## 2017-06-17 VITALS — BP 114/71 | HR 87 | Ht 76.0 in | Wt 252.0 lb

## 2017-06-17 DIAGNOSIS — I482 Chronic atrial fibrillation, unspecified: Secondary | ICD-10-CM

## 2017-06-17 DIAGNOSIS — I5022 Chronic systolic (congestive) heart failure: Secondary | ICD-10-CM

## 2017-06-17 DIAGNOSIS — I428 Other cardiomyopathies: Secondary | ICD-10-CM | POA: Diagnosis not present

## 2017-06-17 LAB — CUP PACEART INCLINIC DEVICE CHECK
Battery Voltage: 3.08 V
Brady Statistic RV Percent Paced: 3.88 %
HIGH POWER IMPEDANCE MEASURED VALUE: 55 Ohm
HighPow Impedance: 342 Ohm
Implantable Pulse Generator Implant Date: 20130529
Lead Channel Pacing Threshold Pulse Width: 0.4 ms
Lead Channel Sensing Intrinsic Amplitude: 3.375 mV
Lead Channel Sensing Intrinsic Amplitude: 4.625 mV
Lead Channel Setting Pacing Amplitude: 2.5 V
Lead Channel Setting Pacing Pulse Width: 0.4 ms
Lead Channel Setting Sensing Sensitivity: 0.3 mV
MDC IDC LEAD IMPLANT DT: 20130529
MDC IDC LEAD LOCATION: 753860
MDC IDC MSMT LEADCHNL RV IMPEDANCE VALUE: 399 Ohm
MDC IDC MSMT LEADCHNL RV PACING THRESHOLD AMPLITUDE: 0.875 V
MDC IDC SESS DTM: 20180803100240

## 2017-06-17 NOTE — Progress Notes (Signed)
PCP: Deloria Lair., MD Primary Cardiologist:  DR Domenic Polite Primary EP: Dr Rayann Heman  [name] [name] is a 61 y.o. male who presents today for routine electrophysiology followup.  Since last being seen in our clinic, the patient reports doing very well.  He has lost 25 lbs since I saw him last!  Recently hospitalized for atypical chest pain.  Follow-up with Dr Domenic Polite 06/08/17 reviewed.   Today, he denies symptoms of palpitations, further chest pain, shortness of breath,  lower extremity edema, dizziness, presyncope, syncope, or ICD shocks.  The patient is otherwise without complaint today.   Past Medical History:  Diagnosis Date  . Anemia   . Atrial fibrillation ([place])   . AVM (arteriovenous malformation)    Duodenum - nonbleeding and EGD 11/13  . Cardiac arrest ([place]) 1998   ICD implanted at [facility]  . Cardiomyopathy, nonischemic (HCC)    LVEF 35-40%  . Chronic systolic heart failure ([place])   . Essential hypertension, benign   . History of colonic polyps    Colonoscopy 11/13  . Pneumonia    Severe with respiratory failure 10/13   Past Surgical History:  Procedure Laterality Date  . [place]  . COLONOSCOPY  09/26/2012   Procedure: COLONOSCOPY;  Surgeon: Beryle Beams, MD;  Location: [place];  Service: Endoscopy;  Laterality: N/A;  . Defibrillator system revision  04/12/12   MDT Protecta XT VR implanted at [facility] by Dr Deno Etienne with previously implanted system and leads extracted due to RV lead failure  . ESOPHAGOGASTRODUODENOSCOPY  09/25/2012   Procedure: ESOPHAGOGASTRODUODENOSCOPY (EGD);  Surgeon: Beryle Beams, MD;  Location: [facility] ENDOSCOPY;  Service: Endoscopy;  Laterality: N/A;    ROS- all systems are reviewed and negative except as per HPI above  Current Outpatient Prescriptions  Medication Sig Dispense Refill  . carvedilol (COREG) 25 MG tablet Take 25 mg by mouth 2 (two) times daily with a meal.    . Cholecalciferol (VITAMIN D3) 2000 UNITS  capsule Take 2,000 Units by mouth daily.      Marland Kitchen diltiazem (CARDIZEM CD) 180 MG 24 hr capsule Take 1 capsule (180 mg total) by mouth daily. 90 capsule 3  . finasteride (PROSCAR) 5 MG tablet Take 5 mg by mouth daily.     . furosemide (LASIX) 80 MG tablet TAKE 1 TABLET (80 MG TOTAL) BY MOUTH DAILY. 90 tablet 3  . isosorbide mononitrate (IMDUR) 30 MG 24 hr tablet Take 1 tablet (30 mg total) by mouth daily.    Marland Kitchen lisinopril (PRINIVIL,ZESTRIL) 5 MG tablet Take 5 mg by mouth daily.    . polyethylene glycol powder (GLYCOLAX/MIRALAX) powder Take 17 g by mouth daily as needed. For constipation - mix with 8 oz liquid and drink    . potassium chloride (MICRO-K) 10 MEQ CR capsule Take 1 capsule (10 mEq total) by mouth daily. 90 capsule 3  . pravastatin (PRAVACHOL) 40 MG tablet Take 40 mg by mouth at bedtime.     . tamsulosin (FLOMAX) 0.4 MG CAPS capsule Take 0.4 mg by mouth daily.    Marland Kitchen warfarin (COUMADIN) 2 MG tablet Take 2 mg by mouth daily.    Marland Kitchen warfarin (COUMADIN) 5 MG tablet Take 5 mg by mouth daily. Taking with 3mg  for a total of 7mg      No current facility-administered medications for this visit.     Physical Exam: Vitals:   06/17/17 0840  BP: 114/71  Pulse: 87  SpO2: 100%  Weight: 252 lb (114.3  kg)  Height: 6\' 4"  (1.93 m)    GEN- The patient is well appearing, alert and oriented x 3 today.   Head- normocephalic, atraumatic Eyes-  Sclera clear, conjunctiva pink Ears- hearing intact Oropharynx- clear Lungs- Clear to ausculation bilaterally, normal work of breathing Chest- ICD pocket is well healed Heart- irregular rate and rhythm, no murmurs, rubs or gallops, PMI not laterally displaced GI- soft, NT, ND, + BS Extremities- no clubbing, cyanosis, or edema  ICD interrogation- reviewed in detail today,  See PACEART report   Assessment and Plan:  1.  Chronic systolic dysfunction euvolemic today Stable on an appropriate medical regimen Normal ICD function See Pace Art report No changes  today  2. Permanent afib chads2avs score is 2.   Continue anticoagulation  3. Obesity Body mass index is 30.67 kg/m. Making good progress with lifestyle change!  carelink Follow-up with Dr Domenic Polite as scheduled I will see again in 1 year  Thompson Grayer MD, [facility] 06/17/2017 9:09 AM

## 2017-06-17 NOTE — Patient Instructions (Addendum)
Medication Instructions:  Continue all current medications.  Labwork: none  Testing/Procedures: none  Follow-Up: Your physician wants you to follow up in:  1 year.  You will receive a reminder letter in the mail one-two months in advance.  If you don't receive a letter, please call our office to schedule the follow up appointment.  (DR. ALLRED)  Any Other Special Instructions Will Be Listed Below (If Applicable). Remote monitoring is used to monitor your Pacemaker of ICD from home. This monitoring reduces the number of office visits required to check your device to one time per year. It allows [place] to keep an eye on the functioning of your device to ensure it is working properly. You are scheduled for a device check from home on 07-19-2017. You may send your transmission at any time that day. If you have a wireless device, the transmission will be sent automatically. After your physician reviews your transmission, you will receive a postcard with your next transmission date.  If you need a refill on your cardiac medications before your next appointment, please call your pharmacy.

## 2017-07-19 ENCOUNTER — Ambulatory Visit (INDEPENDENT_AMBULATORY_CARE_PROVIDER_SITE_OTHER): Payer: Medicare HMO | Admitting: *Deleted

## 2017-07-19 DIAGNOSIS — I5022 Chronic systolic (congestive) heart failure: Secondary | ICD-10-CM

## 2017-07-19 DIAGNOSIS — I428 Other cardiomyopathies: Secondary | ICD-10-CM

## 2017-07-19 DIAGNOSIS — Z95810 Presence of automatic (implantable) cardiac defibrillator: Secondary | ICD-10-CM | POA: Diagnosis not present

## 2017-07-19 NOTE — Progress Notes (Signed)
ICD Remote transmission 

## 2017-07-19 NOTE — Progress Notes (Signed)
EPIC Encounter for ICM Monitoring  Patient Name: [name] [name] is a 61 y.o. male Date: 07/19/2017 Primary Care Physican: Deloria Lair., MD Primary [place] Electrophysiologist: Allred Dry Weight:248lbs        Heart Failure questions reviewed, pt asymptomatic.   Thoracic impedance abnormal suggesting fluid accumulation.  Prescribed dosage: Furosemide 80 mg 1 tablet daily  Labs: 06/27/2017 Creatinine 0.77, BUN 14, Potassium 4.5, Sodium 132, EGFR >60 05/13/2017 Creatinine 0.77, BUN 8, Potassium 4.0, Sodium 130, EGFR >60   Recommendations:  Advised to increase Furosemide to 80 mg AM and 40 mg PM x 3 days and Potassium 10 mEq 1 tablet in AM and 1 tablet PM x 3 days and then return to prescribed dosages of each.  He verbalized understanding.  Advised to limit salt to 2000 mg daily.  He has been eating Chef salads with deli meats.  Advised the deli meat, cheese and dressing are all high in salt.   Follow-up plan: ICM clinic phone appointment on 07/26/2017.   Copy of ICM check sent to Dr. Rayann Heman and Dr. Domenic Polite for review.   3 month ICM trend: 07/19/2017   1 Year ICM trend:      Rosalene Billings, RN 07/19/2017 3:09 PM

## 2017-07-26 ENCOUNTER — Telehealth: Payer: Self-pay

## 2017-07-26 ENCOUNTER — Ambulatory Visit (INDEPENDENT_AMBULATORY_CARE_PROVIDER_SITE_OTHER): Payer: Self-pay

## 2017-07-26 ENCOUNTER — Telehealth: Payer: Self-pay | Admitting: Cardiology

## 2017-07-26 DIAGNOSIS — Z95810 Presence of automatic (implantable) cardiac defibrillator: Secondary | ICD-10-CM

## 2017-07-26 DIAGNOSIS — I5022 Chronic systolic (congestive) heart failure: Secondary | ICD-10-CM

## 2017-07-26 LAB — CUP PACEART REMOTE DEVICE CHECK
Battery Voltage: 3.08 V
Brady Statistic RV Percent Paced: 5.01 %
HIGH POWER IMPEDANCE MEASURED VALUE: 304 Ohm
HIGH POWER IMPEDANCE MEASURED VALUE: 47 Ohm
Lead Channel Sensing Intrinsic Amplitude: 2.75 mV
Lead Channel Sensing Intrinsic Amplitude: 2.75 mV
Lead Channel Setting Pacing Amplitude: 2.5 V
Lead Channel Setting Pacing Pulse Width: 0.4 ms
MDC IDC LEAD IMPLANT DT: 20130529
MDC IDC LEAD LOCATION: 753860
MDC IDC MSMT LEADCHNL RV IMPEDANCE VALUE: 399 Ohm
MDC IDC MSMT LEADCHNL RV PACING THRESHOLD AMPLITUDE: 0.875 V
MDC IDC MSMT LEADCHNL RV PACING THRESHOLD PULSEWIDTH: 0.4 ms
MDC IDC PG IMPLANT DT: 20130529
MDC IDC SESS DTM: 20180904062709
MDC IDC SET LEADCHNL RV SENSING SENSITIVITY: 0.3 mV

## 2017-07-26 NOTE — Telephone Encounter (Signed)
Spoke with pt and reminded pt of remote transmission that is due today. Pt verbalized understanding.   

## 2017-07-26 NOTE — Progress Notes (Signed)
EPIC Encounter for ICM Monitoring  Patient Name: [name] [name] is a 61 y.o. male Date: 07/26/2017 Primary Care Physican: Deloria Lair., MD Primary [place] Electrophysiologist: Allred Dry Weight:Previous ICM weight 248lbs      Attempted call to patient and unable to reach.  Left detailed message regarding transmission.  Transmission reviewed.    Thoracic impedance returned to normal after Furosemide increased x 3 days.   Prescribed dosage: Furosemide 80 mg 1 tablet daily  Labs: 06/27/2017 Creatinine 0.77, BUN 14, Potassium 4.5, Sodium 132, EGFR >60 05/13/2017 Creatinine 0.77, BUN 8, Potassium 4.0, Sodium 130, EGFR >60   Recommendations: .Left voice mail with ICM number and encouraged to call for fluid symptoms.  Follow-up plan: ICM clinic phone appointment on 08/19/2017.    Copy of ICM check sent to Dr. Rayann Heman.   3 month ICM trend: 07/26/2017   1 Year ICM trend:      Rosalene Billings, RN 07/26/2017 2:00 PM

## 2017-07-26 NOTE — Telephone Encounter (Signed)
Returned patient call as requested by voice mail message.  He asked when he should send remote transmission and advised any time today.

## 2017-07-26 NOTE — Telephone Encounter (Signed)
Remote ICM transmission received.  Attempted call to patient and left detailed message regarding transmission and next ICM scheduled for 08/19/2017.  Advised to return call for any fluid symptoms or questions.

## 2017-07-27 ENCOUNTER — Encounter: Payer: Self-pay | Admitting: Cardiology

## 2017-08-19 ENCOUNTER — Ambulatory Visit (INDEPENDENT_AMBULATORY_CARE_PROVIDER_SITE_OTHER): Payer: Medicare HMO

## 2017-08-19 DIAGNOSIS — Z95810 Presence of automatic (implantable) cardiac defibrillator: Secondary | ICD-10-CM | POA: Diagnosis not present

## 2017-08-19 DIAGNOSIS — I5022 Chronic systolic (congestive) heart failure: Secondary | ICD-10-CM | POA: Diagnosis not present

## 2017-08-19 NOTE — Progress Notes (Signed)
EPIC Encounter for ICM Monitoring  Patient Name: [name] [name] is a 61 y.o. male Date: 08/19/2017 Primary Care Physican: Deloria Lair., MD Primary [place] Electrophysiologist: Allred Dry Weight:248lbs      Heart Failure questions reviewed, pt asymptomatic.  Patient has been eating at restaurants more in the last week.    Thoracic impedance abnormal suggesting fluid accumulation.  Prescribed dosage: Furosemide 80 mg 1 tablet daily  Labs: 06/27/2017 Creatinine 0.77, BUN 14, Potassium 4.5, Sodium 132, EGFR >60 05/13/2017 Creatinine 0.77, BUN 8,   Potassium 4.0, Sodium 130, EGFR >60   Recommendations:  Advised to increase Furosemide to 80 mg AM and 40 mg PM x 2 days.  Follow-up plan: ICM clinic phone appointment on 08/22/2017 to recheck fluid levels  Copy of ICM check sent to Dr. Rayann Heman and Dr. Domenic Polite.   3 month ICM trend: 08/19/2017   1 Year ICM trend:      Rosalene Billings, RN 08/19/2017 8:10 AM

## 2017-08-22 ENCOUNTER — Ambulatory Visit (INDEPENDENT_AMBULATORY_CARE_PROVIDER_SITE_OTHER): Payer: Self-pay

## 2017-08-22 ENCOUNTER — Telehealth: Payer: Self-pay

## 2017-08-22 DIAGNOSIS — Z95810 Presence of automatic (implantable) cardiac defibrillator: Secondary | ICD-10-CM

## 2017-08-22 DIAGNOSIS — I5022 Chronic systolic (congestive) heart failure: Secondary | ICD-10-CM

## 2017-08-22 NOTE — Telephone Encounter (Signed)
Patient returned call and said he was in [place] but as soon as he got home he would send remote transmission.

## 2017-08-22 NOTE — Telephone Encounter (Signed)
LMOVM requesting that pt send manual transmission 

## 2017-08-23 NOTE — Progress Notes (Signed)
EPIC Encounter for ICM Monitoring  Patient Name: [name] [name] is a 61 y.o. male Date: 08/23/2017 Primary Care Physican: Deloria Lair., MD Primary Cardiologist:[place] Electrophysiologist: Allred Dry Weight: 230lbs           Heart Failure questions reviewed, pt asymptomatic.   Thoracic impedance showed improvement after increasing Furosemide x 2 days and trending back close to baseline.  Prescribed dosage: Furosemide 80 mg 1 tablet daily  Labs: 06/27/2017 Creatinine 0.77, BUN 14, Potassium 4.5, Sodium 132, EGFR >60 05/13/2017 Creatinine 0.77, BUN 8,   Potassium 4.0, Sodium 130, EGFR >60  Recommendations: No changes.  Advised to limit salt intake to 2000 mg/day and fluid intake to < 2 liters/day.  Encouraged to call for fluid symptoms.  Follow-up plan: ICM clinic phone appointment on 09/19/2017.    Copy of ICM check sent to Dr. Rayann Heman and Dr. Domenic Polite.   3 month ICM trend: 08/23/2017   1 Year ICM trend:      Rosalene Billings, RN 08/23/2017 8:59 AM

## 2017-08-23 NOTE — Progress Notes (Signed)
Patient called back to discuss more in depth regarding fluid and salt restriction. He also requested HF symptom information.  Education information mailed on low salt diet, HF symptoms and Heart Healthy Eating

## 2017-09-19 ENCOUNTER — Ambulatory Visit (INDEPENDENT_AMBULATORY_CARE_PROVIDER_SITE_OTHER): Payer: Medicare HMO

## 2017-09-19 DIAGNOSIS — Z95810 Presence of automatic (implantable) cardiac defibrillator: Secondary | ICD-10-CM | POA: Diagnosis not present

## 2017-09-19 DIAGNOSIS — I5022 Chronic systolic (congestive) heart failure: Secondary | ICD-10-CM | POA: Diagnosis not present

## 2017-09-19 NOTE — Progress Notes (Signed)
EPIC Encounter for ICM Monitoring  Patient Name: [name] [name] is a 61 y.o. male Date: 09/19/2017 Primary Care Physican: Deloria Lair., MD Primary [place] Electrophysiologist: Allred Dry Weight:238lbs      Heart Failure questions reviewed, pt asymptomatic.   Thoracic impedance normal.  Prescribed dosage: Furosemide 80 mg 1 tablet daily  Labs: 06/27/2017 Creatinine 0.77, BUN 14, Potassium 4.5, Sodium 132, EGFR >60 05/13/2017 Creatinine 0.77, BUN 8, Potassium 4.0, Sodium 130, EGFR >60  Recommendations: No changes.    Encouraged to call for fluid symptoms.  Follow-up plan: ICM clinic phone appointment on 10/20/2017.   Copy of ICM check sent to Dr. Rayann Heman.   3 month ICM trend: 09/19/2017   1 Year ICM trend:      Rosalene Billings, RN 09/19/2017 1:03 PM

## 2017-09-23 ENCOUNTER — Ambulatory Visit: Payer: Medicare HMO | Admitting: Urology

## 2017-09-23 DIAGNOSIS — N401 Enlarged prostate with lower urinary tract symptoms: Secondary | ICD-10-CM | POA: Diagnosis not present

## 2017-09-23 DIAGNOSIS — R972 Elevated prostate specific antigen [PSA]: Secondary | ICD-10-CM | POA: Diagnosis not present

## 2017-10-03 ENCOUNTER — Ambulatory Visit: Payer: Medicare HMO | Admitting: Family Medicine

## 2017-10-20 ENCOUNTER — Ambulatory Visit (INDEPENDENT_AMBULATORY_CARE_PROVIDER_SITE_OTHER): Payer: Medicare HMO | Admitting: *Deleted

## 2017-10-20 DIAGNOSIS — Z95810 Presence of automatic (implantable) cardiac defibrillator: Secondary | ICD-10-CM | POA: Diagnosis not present

## 2017-10-20 DIAGNOSIS — I428 Other cardiomyopathies: Secondary | ICD-10-CM

## 2017-10-20 DIAGNOSIS — I5022 Chronic systolic (congestive) heart failure: Secondary | ICD-10-CM | POA: Diagnosis not present

## 2017-10-20 NOTE — Progress Notes (Signed)
EPIC Encounter for ICM Monitoring  Patient Name: ARVID MARENGO is a 61 y.o. male Date: 10/20/2017 Primary Care Physican: Deloria Lair., MD Primary Cardiologist:[place] Electrophysiologist: Allred Dry Weight:230lbs              Heart Failure questions reviewed, pt asymptomatic.   Thoracic impedance normal.  Prescribed dosage: Furosemide 80 mg 1 tablet daily  Labs: 06/27/2017 Creatinine 0.77, BUN 14, Potassium 4.5, Sodium 132, EGFR >60 05/13/2017 Creatinine 0.77, BUN 8, Potassium 4.0, Sodium 130, EGFR >60  Recommendations: No changes.   Encouraged to call for fluid symptoms.  Follow-up plan: ICM clinic phone appointment on 11/24/2017.  Office appointment scheduled 12/07/2017 with Dr. Domenic Polite.  Copy of ICM check sent to Dr. Rayann Heman.   3 month ICM trend: 10/20/2017    1 Year ICM trend:       Rosalene Billings, RN 10/20/2017 11:17 AM

## 2017-10-20 NOTE — Progress Notes (Signed)
Remote ICD transmission.   

## 2017-10-25 LAB — CUP PACEART REMOTE DEVICE CHECK
Date Time Interrogation Session: 20181206123630
HighPow Impedance: 304 Ohm
HighPow Impedance: 51 Ohm
Implantable Lead Implant Date: 20130529
Implantable Lead Location: 753860
Implantable Lead Model: 6935
Lead Channel Pacing Threshold Amplitude: 0.875 V
Lead Channel Sensing Intrinsic Amplitude: 3.5 mV
MDC IDC MSMT BATTERY VOLTAGE: 3.02 V
MDC IDC MSMT LEADCHNL RV IMPEDANCE VALUE: 399 Ohm
MDC IDC MSMT LEADCHNL RV PACING THRESHOLD PULSEWIDTH: 0.4 ms
MDC IDC MSMT LEADCHNL RV SENSING INTR AMPL: 3.5 mV
MDC IDC PG IMPLANT DT: 20130529
MDC IDC SET LEADCHNL RV PACING AMPLITUDE: 2.5 V
MDC IDC SET LEADCHNL RV PACING PULSEWIDTH: 0.4 ms
MDC IDC SET LEADCHNL RV SENSING SENSITIVITY: 0.3 mV
MDC IDC STAT BRADY RV PERCENT PACED: 3.92 %

## 2017-10-28 ENCOUNTER — Encounter: Payer: Self-pay | Admitting: Cardiology

## 2017-11-17 ENCOUNTER — Other Ambulatory Visit: Payer: Self-pay | Admitting: Cardiology

## 2017-11-21 ENCOUNTER — Other Ambulatory Visit: Payer: Self-pay | Admitting: Cardiology

## 2017-11-24 ENCOUNTER — Ambulatory Visit (INDEPENDENT_AMBULATORY_CARE_PROVIDER_SITE_OTHER): Payer: Medicare HMO

## 2017-11-24 DIAGNOSIS — Z95810 Presence of automatic (implantable) cardiac defibrillator: Secondary | ICD-10-CM | POA: Diagnosis not present

## 2017-11-24 DIAGNOSIS — I5022 Chronic systolic (congestive) heart failure: Secondary | ICD-10-CM

## 2017-11-25 NOTE — Progress Notes (Signed)
EPIC Encounter for ICM Monitoring  Patient Name: [name] [name] is a 62 y.o. male Date: 11/25/2017 Primary Care Physican: Deloria Lair., MD Primary Cardiologist:[place] Electrophysiologist: Allred Dry Weight:232lbs      Heart Failure questions reviewed, pt asymptomatic.   Thoracic impedance slightly below baseline.  Prescribed dosage: Furosemide 80 mg 1 tablet daily  Labs: 06/27/2017 Creatinine 0.77, BUN 14, Potassium 4.5, Sodium 132, EGFR >60 05/13/2017 Creatinine 0.77, BUN 8, Potassium 4.0, Sodium 130, EGFR >60  Recommendations: No changes.   Encouraged to call for fluid symptoms.  Follow-up plan: ICM clinic phone appointment on 12/27/2017.  Office appointment scheduled 12/07/2017 with Dr. Domenic Polite.  Copy of ICM check sent to Dr. Rayann Heman and Dr. Domenic Polite.   3 month ICM trend: 11/24/2017    1 Year ICM trend:       Rosalene Billings, RN 11/25/2017 3:53 PM

## 2017-12-01 ENCOUNTER — Other Ambulatory Visit: Payer: Self-pay | Admitting: Cardiology

## 2017-12-01 MED ORDER — DILTIAZEM HCL ER COATED BEADS 180 MG PO CP24: 180.0000 mg | ORAL_CAPSULE | Freq: Every day | ORAL | 3 refills | Status: DC

## 2017-12-01 NOTE — Telephone Encounter (Signed)
Done

## 2017-12-01 NOTE — Telephone Encounter (Signed)
diltiazem (CARDIZEM CD) 180 MG 24 hr capsule  [facility] [place]

## 2017-12-02 ENCOUNTER — Other Ambulatory Visit: Payer: Self-pay | Admitting: Cardiology

## 2017-12-05 NOTE — Progress Notes (Signed)
Cardiology Office Note  Date: 12/07/2017   ID: [name] [name], DOB 1956/09/30, MRN [id number]  PCP: Patient, No Pcp Per  Primary Cardiologist: Rozann Lesches, MD   Chief Complaint  Patient presents with  . Cardiomyopathy    History of Present Illness: [name] [name] is a 62 y.o. male last seen in July 2018. He presents for a routine follow-up visit. He states that he has been trying to watch his diet in terms of calories and lower salt, he has lost over 10 pounds. States that he feels better, has NYHA class II dyspnea, no palpitations or chest pain.  He is on Coumadin with follow-up per PCP. Dr. Scotty Court has retired, but he is following through that same office with an interval provider.  He continues to follow in the device clinic with Dr. Rayann Heman, Medtronic ICD in place. Recent device interrogation demonstrated thoracic impedance slightly below baseline. No changes were made in current diuretic regimen.  Last echocardiogram was in December 2017 showing LVEF 40-45%.  We reviewed his medications which are stable from a cardiac perspective.  Past Medical History:  Diagnosis Date  . Anemia   . Atrial fibrillation ([place])   . AVM (arteriovenous malformation)    Duodenum - nonbleeding and EGD 11/13  . Cardiac arrest ([place]) 1998   ICD implanted at [facility]  . Cardiomyopathy, nonischemic (HCC)    LVEF 35-40%  . Chronic systolic heart failure ([place])   . Essential hypertension, benign   . History of colonic polyps    Colonoscopy 11/13  . Pneumonia    Severe with respiratory failure 10/13    Past Surgical History:  Procedure Laterality Date  . [place]  . COLONOSCOPY  09/26/2012   Procedure: COLONOSCOPY;  Surgeon: Beryle Beams, MD;  Location: [place];  Service: Endoscopy;  Laterality: N/A;  . Defibrillator system revision  04/12/12   MDT Protecta XT VR implanted at [facility] by Dr Deno Etienne with previously implanted system and leads extracted due  to RV lead failure  . ESOPHAGOGASTRODUODENOSCOPY  09/25/2012   Procedure: ESOPHAGOGASTRODUODENOSCOPY (EGD);  Surgeon: Beryle Beams, MD;  Location: [facility] ENDOSCOPY;  Service: Endoscopy;  Laterality: N/A;    Current Outpatient Medications  Medication Sig Dispense Refill  . carvedilol (COREG) 25 MG tablet TAKE 1 TABLET (25 MG TOTAL) BY MOUTH 2 (TWO) TIMES DAILY. 180 tablet 3  . Cholecalciferol (VITAMIN D3) 2000 UNITS capsule Take 2,000 Units by mouth daily.      Marland Kitchen diltiazem (CARDIZEM CD) 180 MG 24 hr capsule Take 1 capsule (180 mg total) by mouth daily. 90 capsule 3  . finasteride (PROSCAR) 5 MG tablet Take 5 mg by mouth daily.     . furosemide (LASIX) 80 MG tablet TAKE 1 TABLET (80 MG TOTAL) BY MOUTH DAILY. 90 tablet 3  . isosorbide mononitrate (IMDUR) 30 MG 24 hr tablet Take 1 tablet (30 mg total) by mouth daily.    Marland Kitchen lisinopril (PRINIVIL,ZESTRIL) 5 MG tablet Take 5 mg by mouth daily.    . polyethylene glycol powder (GLYCOLAX/MIRALAX) powder Take 17 g by mouth daily as needed. For constipation - mix with 8 oz liquid and drink    . potassium chloride (MICRO-K) 10 MEQ CR capsule Take 1 capsule (10 mEq total) by mouth daily. 90 capsule 3  . pravastatin (PRAVACHOL) 40 MG tablet Take 40 mg by mouth at bedtime.     . tamsulosin (FLOMAX) 0.4 MG CAPS capsule Take 0.4 mg by mouth daily.    Marland Kitchen  warfarin (COUMADIN) 2 MG tablet Take 2 mg by mouth daily.    Marland Kitchen warfarin (COUMADIN) 5 MG tablet Take 5 mg by mouth daily. Taking with 3mg  for a total of 7mg      No current facility-administered medications for this visit.    Allergies:  Patient has no known allergies.   Social History: The patient  reports that he quit smoking about 21 years ago. His smoking use included cigarettes. He started smoking about 39 years ago. He has a 4.80 pack-year smoking history. he has never used smokeless tobacco. He reports that he uses drugs. Drug: Marijuana. He reports that he does not drink alcohol.   ROS:  Please see the  history of present illness. Otherwise, complete review of systems is positive for none.  All other systems are reviewed and negative.   Physical Exam: VS:  BP 124/70   Pulse 87   Ht 6\' 4"  (1.93 m)   Wt 240 lb (108.9 kg)   SpO2 98%   BMI 29.21 kg/m , BMI Body mass index is 29.21 kg/m.  Wt Readings from Last 3 Encounters:  12/07/17 240 lb (108.9 kg)  06/17/17 252 lb (114.3 kg)  06/08/17 253 lb 9.6 oz (115 kg)    General: Tall statured, appears comfortable at rest. HEENT: Conjunctiva and lids normal, oropharynx clear. Neck: Supple, no elevated JVP or carotid bruits, no thyromegaly. Lungs: Clear to auscultation, nonlabored breathing at rest. Cardiac: Irregularly irregular, no S3 or significant systolic murmur. Abdomen: Soft, nontender, bowel sounds present. Extremities: No pitting edema, distal pulses 2+. Skin: Warm and dry. Musculoskeletal: No kyphosis. Neuropsychiatric: Alert and oriented x3, affect grossly appropriate.  ECG: I personally reviewed the tracing from 05/13/2017 which showed atrial fibrillation.  Recent Labwork: 05/13/2017: ALT 15; AST 14; BUN 8; Creatinine, Ser 0.77; Hemoglobin 12.8; Platelets 186; Potassium 4.0; Sodium 130; TSH 2.152  August 2018: Hemoglobin 12.6, platelets 236, BUN 14, creatinine 0.77, ALT 24, AST 29, potassium 4.5, triglycerides 56, cholesterol 78, HDL 21, LDL 46  Other Studies Reviewed Today:  Echocardiogram 10/21/2016: Study Conclusions  - Left ventricle: The cavity size was normal. Wall thickness was   increased in a pattern of mild LVH. Systolic function was mildly   to moderately reduced. The estimated ejection fraction was in the   range of 40% to 45%. Diffuse hypokinesis. Findings consistent   with left ventricular diastolic dysfunction. Doppler parameters   are consistent with high ventricular filling pressure. - Mitral valve: Mildly thickened leaflets . There was mild   regurgitation. - Left atrium: The atrium was mildly  dilated. - Right ventricle: The cavity size was mildly dilated. Pacer wire   or catheter noted in right ventricle. - Right atrium: The atrium was moderately dilated. Pacer wire or   catheter noted in right atrium. - Tricuspid valve: There was moderate regurgitation. - Systemic veins: IVC dilated with normal respiratory variation.   Estimated CVP 8 mmHg. - Pericardium, extracardiac: A trivial pericardial effusion was   identified. Features were not consistent with tamponade   physiology.  Assessment and Plan:  1. Chronic systolic heart failure, symptomatically stable at this time. He has lost weight through diet, fluid status also looks to be reasonably well controlled. We will continue with current diuretic regimen.  2. Nonischemic cardiomyopathy with LVEF 40-45%. We will consider a follow-up echocardiogram later this year. Continue with current medications.  3. 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 He reports no device shocks or syncope. Keep follow-up with Dr. Rayann Heman.  4. Essential hypertension,  blood pressure is well controlled today.  Current medicines were reviewed with the patient today.  Disposition: Follow-up in 6 months.  Signed, Satira Sark, MD, [facility] 12/07/2017 8:55 AM    [facility] [place], [place], [place] [address] Phone: [phone]; Fax: [phone]

## 2017-12-07 ENCOUNTER — Ambulatory Visit: Payer: Medicare HMO | Admitting: Cardiology

## 2017-12-07 ENCOUNTER — Encounter: Payer: Self-pay | Admitting: Cardiology

## 2017-12-07 VITALS — BP 124/70 | HR 87 | Ht 76.0 in | Wt 240.0 lb

## 2017-12-07 DIAGNOSIS — I428 Other cardiomyopathies: Secondary | ICD-10-CM

## 2017-12-07 DIAGNOSIS — I5022 Chronic systolic (congestive) heart failure: Secondary | ICD-10-CM | POA: Diagnosis not present

## 2017-12-07 DIAGNOSIS — Z95810 Presence of automatic (implantable) cardiac defibrillator: Secondary | ICD-10-CM

## 2017-12-07 DIAGNOSIS — I10 Essential (primary) hypertension: Secondary | ICD-10-CM

## 2017-12-07 NOTE — Patient Instructions (Signed)

## 2017-12-27 ENCOUNTER — Ambulatory Visit (INDEPENDENT_AMBULATORY_CARE_PROVIDER_SITE_OTHER): Payer: Medicare HMO

## 2017-12-27 DIAGNOSIS — I5022 Chronic systolic (congestive) heart failure: Secondary | ICD-10-CM

## 2017-12-27 DIAGNOSIS — Z95810 Presence of automatic (implantable) cardiac defibrillator: Secondary | ICD-10-CM | POA: Diagnosis not present

## 2017-12-27 NOTE — Progress Notes (Signed)
EPIC Encounter for ICM Monitoring  Patient Name: [name] [name] is a 62 y.o. male Date: 12/27/2017 Primary Care Physican: Patient, No Pcp Per Primary Cardiologist:McDowell Electrophysiologist: Allred Dry Weight:232lbs      Heart Failure questions reviewed, pt asymptomatic.   Thoracic impedance normal.  Prescribed dosage: Furosemide 80 mg 1 tablet daily  Labs: 06/27/2017 Creatinine 0.77, BUN 14, Potassium 4.5, Sodium 132, EGFR >60 05/13/2017 Creatinine 0.77, BUN 8, Potassium 4.0, Sodium 130, EGFR >60  Recommendations: No changes.    Encouraged to call for fluid symptoms.  Follow-up plan: ICM clinic phone appointment on 01/30/2018.   Copy of ICM check sent to Dr. Rayann Heman.   3 month ICM trend: 12/27/2017    1 Year ICM trend:       Rosalene Billings, RN 12/27/2017 9:40 AM

## 2018-01-30 ENCOUNTER — Ambulatory Visit (INDEPENDENT_AMBULATORY_CARE_PROVIDER_SITE_OTHER): Payer: Medicare HMO | Admitting: *Deleted

## 2018-01-30 DIAGNOSIS — Z95810 Presence of automatic (implantable) cardiac defibrillator: Secondary | ICD-10-CM

## 2018-01-30 DIAGNOSIS — I5022 Chronic systolic (congestive) heart failure: Secondary | ICD-10-CM

## 2018-01-30 DIAGNOSIS — I428 Other cardiomyopathies: Secondary | ICD-10-CM

## 2018-01-30 NOTE — Progress Notes (Signed)
EPIC Encounter for ICM Monitoring  Patient Name: [name] [name] is a 62 y.o. male Date: 01/30/2018 Primary Care Physican: Patient, No Pcp Per Primary Cardiologist:McDowell Electrophysiologist: Allred Dry Weight:232lbs       Heart Failure questions reviewed, pt asymptomatic. He reported weight is stable.  Has a new PCP.    Thoracic impedance normal.  Prescribed dosage: Furosemide 80 mg 1 tablet daily  Labs: 06/27/2017 Creatinine 0.77, BUN 14, Potassium 4.5, Sodium 132, EGFR >60 05/13/2017 Creatinine 0.77, BUN 8, Potassium 4.0, Sodium 130, EGFR >60  Recommendations: No changes.   Encouraged to call for fluid symptoms.  Follow-up plan: ICM clinic phone appointment on 03/02/2018.    Copy of ICM check sent to Dr. Rayann Heman.   3 month ICM trend: 01/30/2018    1 Year ICM trend:       Rosalene Billings, RN 01/30/2018 11:11 AM

## 2018-01-30 NOTE — Progress Notes (Signed)
Remote ICD transmission.   

## 2018-02-01 ENCOUNTER — Encounter: Payer: Self-pay | Admitting: Cardiology

## 2018-02-01 NOTE — Progress Notes (Signed)
Letter  

## 2018-02-07 LAB — CUP PACEART REMOTE DEVICE CHECK
Date Time Interrogation Session: 20190318062608
HighPow Impedance: 304 Ohm
HighPow Impedance: 60 Ohm
Implantable Lead Implant Date: 20130529
Implantable Lead Location: 753860
Implantable Lead Model: 6935
Implantable Pulse Generator Implant Date: 20130529
Lead Channel Pacing Threshold Pulse Width: 0.4 ms
Lead Channel Setting Sensing Sensitivity: 0.3 mV
MDC IDC MSMT BATTERY VOLTAGE: 3.07 V
MDC IDC MSMT LEADCHNL RV IMPEDANCE VALUE: 361 Ohm
MDC IDC MSMT LEADCHNL RV PACING THRESHOLD AMPLITUDE: 0.875 V
MDC IDC MSMT LEADCHNL RV SENSING INTR AMPL: 2.625 mV
MDC IDC MSMT LEADCHNL RV SENSING INTR AMPL: 2.625 mV
MDC IDC SET LEADCHNL RV PACING AMPLITUDE: 2.5 V
MDC IDC SET LEADCHNL RV PACING PULSEWIDTH: 0.4 ms
MDC IDC STAT BRADY RV PERCENT PACED: 4.58 %

## 2018-03-02 ENCOUNTER — Ambulatory Visit (INDEPENDENT_AMBULATORY_CARE_PROVIDER_SITE_OTHER): Payer: Medicare HMO

## 2018-03-02 DIAGNOSIS — I5022 Chronic systolic (congestive) heart failure: Secondary | ICD-10-CM | POA: Diagnosis not present

## 2018-03-02 DIAGNOSIS — Z95810 Presence of automatic (implantable) cardiac defibrillator: Secondary | ICD-10-CM

## 2018-03-03 ENCOUNTER — Telehealth: Payer: Self-pay

## 2018-03-03 NOTE — Progress Notes (Signed)
EPIC Encounter for ICM Monitoring  Patient Name: [name] [name] is a 62 y.o. male Date: 03/03/2018 Primary Care Physican: Patient, No Pcp Per Primary Cardiologist:McDowell Electrophysiologist: Allred Dry Weight: Previous weight 232lbs      Attempted call to patient and unable to reach.   Transmission reviewed.    Thoracic impedance slightly abnormal suggesting fluid accumulation.  Prescribed dosage: Furosemide 80 mg 1 tablet daily  Labs: 06/27/2017 Creatinine 0.77, BUN 14, Potassium 4.5, Sodium 132, EGFR >60 05/13/2017 Creatinine 0.77, BUN 8, Potassium 4.0, Sodium 130, EGFR >60  Recommendations: NONE - Unable to reach.  Follow-up plan: ICM clinic phone appointment on 03/14/2018 to recheck fluid levels.    Copy of ICM check sent to Dr. Rayann Heman and Dr. Domenic Polite.   3 month ICM trend: 03/02/2018    1 Year ICM trend:       Rosalene Billings, RN 03/03/2018 10:05 AM

## 2018-03-03 NOTE — Telephone Encounter (Signed)
Remote ICM transmission received.  Attempted call to patient and no message. 

## 2018-03-14 ENCOUNTER — Ambulatory Visit (INDEPENDENT_AMBULATORY_CARE_PROVIDER_SITE_OTHER): Payer: Self-pay

## 2018-03-14 DIAGNOSIS — Z95810 Presence of automatic (implantable) cardiac defibrillator: Secondary | ICD-10-CM

## 2018-03-14 DIAGNOSIS — I5022 Chronic systolic (congestive) heart failure: Secondary | ICD-10-CM

## 2018-03-14 NOTE — Progress Notes (Signed)
EPIC Encounter for ICM Monitoring  Patient Name: [name] [name] is a 62 y.o. male Date: 03/14/2018 Primary Care Physican: Patient, No Pcp Per Primary Cardiologist:McDowell Electrophysiologist: Allred Dry Weight: 232lbs      Heart Failure questions reviewed, pt asymptomatic.   Thoracic impedance close to baseline normal.  Prescribed dosage: Furosemide 80 mg 1 tablet daily  Labs: 06/27/2017 Creatinine 0.77, BUN 14, Potassium 4.5, Sodium 132, EGFR >60 05/13/2017 Creatinine 0.77, BUN 8, Potassium 4.0, Sodium 130, EGFR >60  Recommendations: No changes.   Encouraged to call for fluid symptoms.  Follow-up plan: ICM clinic phone appointment on 04/03/2018.    Copy of ICM check sent to Dr. Rayann Heman.   3 month ICM trend: 03/14/2018    1 Year ICM trend:       Rosalene Billings, RN 03/14/2018 9:17 AM

## 2018-04-03 ENCOUNTER — Telehealth: Payer: Self-pay

## 2018-04-03 NOTE — Telephone Encounter (Signed)
Attempted ICM call to patient to request remote transmission be sent.  No answer and no answering machine.

## 2018-04-20 NOTE — Progress Notes (Signed)
No ICM remote transmission received for 04/03/2018 and next ICM transmission scheduled for 05/01/2018.

## 2018-05-01 ENCOUNTER — Encounter: Payer: Medicare HMO | Admitting: *Deleted

## 2018-05-01 ENCOUNTER — Telehealth: Payer: Self-pay | Admitting: Cardiology

## 2018-05-01 NOTE — Telephone Encounter (Signed)
LMOVM reminding pt to send remote transmission.   

## 2018-05-03 ENCOUNTER — Encounter: Payer: Self-pay | Admitting: Cardiology

## 2018-05-10 ENCOUNTER — Telehealth: Payer: Self-pay

## 2018-05-10 NOTE — Telephone Encounter (Signed)
Pt called to see if his new monitor has been ordered and shipped to him. I looked it up in carelink and told him the shipped date.

## 2018-05-12 ENCOUNTER — Telehealth: Payer: Self-pay

## 2018-05-12 NOTE — Telephone Encounter (Signed)
Returned patient call as requested by voice mail message.  He reported Medtronic sent new monitor and he will send a remote transmission on Monday.  He said he is doing well at this time.

## 2018-05-12 NOTE — Progress Notes (Signed)
No ICM remote transmission received for 05/01/2018 and next ICM transmission scheduled for 05/15/2018.

## 2018-05-15 ENCOUNTER — Ambulatory Visit (INDEPENDENT_AMBULATORY_CARE_PROVIDER_SITE_OTHER): Payer: Medicare HMO | Admitting: *Deleted

## 2018-05-15 DIAGNOSIS — Z95810 Presence of automatic (implantable) cardiac defibrillator: Secondary | ICD-10-CM

## 2018-05-15 DIAGNOSIS — I5022 Chronic systolic (congestive) heart failure: Secondary | ICD-10-CM

## 2018-05-15 DIAGNOSIS — I428 Other cardiomyopathies: Secondary | ICD-10-CM | POA: Diagnosis not present

## 2018-05-15 NOTE — Progress Notes (Signed)
EPIC Encounter for ICM Monitoring  Patient Name: [name] [name] is a 62 y.o. male Date: 05/15/2018 Primary Care Physican: Patient, No Pcp Per Primary Cardiologist:McDowell Electrophysiologist: Allred Dry Weight:unknown       Heart Failure questions reviewed, pt asymptomatic.  He is feeling fine.  He has a new monitor and it is placed by bedside.   Thoracic impedance normal.  Prescribed dosage: Furosemide 80 mg 1 tablet daily  Labs: 06/27/2017 Creatinine 0.77, BUN 14, Potassium 4.5, Sodium 132, EGFR >60 05/13/2017 Creatinine 0.77, BUN 8, Potassium 4.0, Sodium 130, EGFR >60  Recommendations: No changes.   Encouraged to call for fluid symptoms.  Follow-up plan: ICM clinic phone appointment on 07/18/2018.  Office appointment scheduled 06/13/2018 with Dr McDowell and 06/16/2018 with Dr. Allred.   Copy of ICM check sent to Dr. Allred.   3 month ICM trend: 05/15/2018    1 Year ICM trend:       Laurie S Short, RN 05/15/2018 1:27 PM   

## 2018-05-16 NOTE — Progress Notes (Signed)
Remote ICD transmission.   

## 2018-05-17 ENCOUNTER — Encounter: Payer: Self-pay | Admitting: Cardiology

## 2018-05-25 LAB — CUP PACEART REMOTE DEVICE CHECK
Battery Voltage: 3.01 V
Brady Statistic RV Percent Paced: 4.19 %
Date Time Interrogation Session: 20190701165804
HIGH POWER IMPEDANCE MEASURED VALUE: 342 Ohm
HighPow Impedance: 53 Ohm
Implantable Lead Location: 753860
Implantable Lead Model: 6935
Lead Channel Impedance Value: 399 Ohm
Lead Channel Pacing Threshold Amplitude: 1 V
Lead Channel Sensing Intrinsic Amplitude: 3 mV
Lead Channel Setting Pacing Amplitude: 2.5 V
MDC IDC LEAD IMPLANT DT: 20130529
MDC IDC MSMT LEADCHNL RV PACING THRESHOLD PULSEWIDTH: 0.4 ms
MDC IDC MSMT LEADCHNL RV SENSING INTR AMPL: 3 mV
MDC IDC PG IMPLANT DT: 20130529
MDC IDC SET LEADCHNL RV PACING PULSEWIDTH: 0.4 ms
MDC IDC SET LEADCHNL RV SENSING SENSITIVITY: 0.3 mV

## 2018-06-12 NOTE — Progress Notes (Signed)
Cardiology Office Note  Date: 06/13/2018   ID: [name] [name], DOB 1956-11-15, MRN [id number]  PCP: Glenda Chroman, MD  Primary Cardiologist: Rozann Lesches, MD   Chief Complaint  Patient presents with  . Cardiomyopathy    History of Present Illness: [name] [name] is a 62 y.o. male last seen in January.  He is here for a follow-up visit.  He has established with Dr. Woody Seller for PCP.  He does not report any progressive shortness of breath, no exertional chest pain, palpitations, or syncope.  Still following a low carbohydrate low-salt diet and his weight is come down into the 230s.  He remains on Coumadin with follow-up per PCP.  No reported bleeding problems.  He follows with Dr. Rayann Heman in the device clinic, Medtronic ICD in place.  Recent thoracic impedance was normal.  We discussed obtaining a follow-up echocardiogram.  Current cardiac regimen includes Coreg and Cardizem CD (heart rate control), as well as lisinopril, Imdur, Pravachol, Lasix, and potassium supplements.  Past Medical History:  Diagnosis Date  . Anemia   . Atrial fibrillation ([place])   . AVM (arteriovenous malformation)    Duodenum - nonbleeding and EGD 11/13  . Cardiac arrest ([place]) 1998   ICD implanted at [facility]  . Cardiomyopathy, nonischemic (HCC)    LVEF 35-40%  . Chronic systolic heart failure ([place])   . Essential hypertension, benign   . History of colonic polyps    Colonoscopy 11/13  . Pneumonia    Severe with respiratory failure 10/13    Past Surgical History:  Procedure Laterality Date  . [place]  . COLONOSCOPY  09/26/2012   Procedure: COLONOSCOPY;  Surgeon: Beryle Beams, MD;  Location: [facility];  Service: Endoscopy;  Laterality: N/A;  . Defibrillator system revision  04/12/12   MDT Protecta XT VR implanted at [facility] by Dr Deno Etienne with previously implanted system and leads extracted due to RV lead failure  . ESOPHAGOGASTRODUODENOSCOPY  09/25/2012   Procedure: ESOPHAGOGASTRODUODENOSCOPY (EGD);  Surgeon: Beryle Beams, MD;  Location[facility] ENDOSCOPY;  Service: Endoscopy;  Laterality: N/A;    Current Outpatient Medications  Medication Sig Dispense Refill  . carvedilol (COREG) 25 MG tablet TAKE 1 TABLET (25 MG TOTAL) BY MOUTH 2 (TWO) TIMES DAILY. 180 tablet 3  . Cholecalciferol (VITAMIN D3) 2000 UNITS capsule Take 2,000 Units by mouth daily.      Marland Kitchen diltiazem (CARDIZEM CD) 180 MG 24 hr capsule Take 1 capsule (180 mg total) by mouth daily. 90 capsule 3  . finasteride (PROSCAR) 5 MG tablet Take 5 mg by mouth daily.     . furosemide (LASIX) 80 MG tablet TAKE 1 TABLET (80 MG TOTAL) BY MOUTH DAILY. 90 tablet 3  . isosorbide mononitrate (IMDUR) 30 MG 24 hr tablet Take 1 tablet (30 mg total) by mouth daily.    Marland Kitchen lisinopril (PRINIVIL,ZESTRIL) 5 MG tablet Take 5 mg by mouth daily.    . polyethylene glycol powder (GLYCOLAX/MIRALAX) powder Take 17 g by mouth daily as needed. For constipation - mix with 8 oz liquid and drink    . potassium chloride (MICRO-K) 10 MEQ CR capsule Take 1 capsule (10 mEq total) by mouth daily. 90 capsule 3  . pravastatin (PRAVACHOL) 40 MG tablet Take 40 mg by mouth at bedtime.     . tamsulosin (FLOMAX) 0.4 MG CAPS capsule Take 0.4 mg by mouth daily.    Marland Kitchen warfarin (COUMADIN) 1 MG tablet Take 1 mg by mouth daily.    Marland Kitchen  warfarin (COUMADIN) 5 MG tablet Take 5 mg by mouth daily. Taking with 3mg  for a total of 7mg      No current facility-administered medications for this visit.    Allergies:  Patient has no known allergies.   Social History: The patient  reports that he quit smoking about 21 years ago. His smoking use included cigarettes. He started smoking about 40 years ago. He has a 4.80 pack-year smoking history. He has never used smokeless tobacco. He reports that he has current or past drug history. Drug: Marijuana. He reports that he does not drink alcohol.   ROS:  Please see the history of present illness. Otherwise, complete  review of systems is positive for none.  All other systems are reviewed and negative.   Physical Exam: VS:  BP 112/64   Pulse 64   Ht 6\' 5"  (1.956 m)   Wt 236 lb (107 kg)   SpO2 98%   BMI 27.99 kg/m , BMI Body mass index is 27.99 kg/m.  Wt Readings from Last 3 Encounters:  06/13/18 236 lb (107 kg)  12/07/17 240 lb (108.9 kg)  06/17/17 252 lb (114.3 kg)    General: Tall statured male, appears comfortable at rest. HEENT: Conjunctiva and lids normal, oropharynx clear. Neck: Supple, no elevated JVP or carotid bruits, no thyromegaly. Lungs: Clear to auscultation, nonlabored breathing at rest. Cardiac: Irregularly irregular, no S3 or significant systolic murmur. Abdomen: Soft, nontender, bowel sounds present. Extremities: No pitting edema, distal pulses 2+. Skin: Warm and dry. Musculoskeletal: No kyphosis. Neuropsychiatric: Alert and oriented x3, affect grossly appropriate.  ECG: I personally reviewed the tracing from 05/13/2017 which showed atrial fibrillation.  Recent Labwork:  August 2018: Hemoglobin 12.6, platelets 236, BUN 14, creatinine 0.77, ALT 24, AST 29, potassium 4.5, triglycerides 56, cholesterol 78, HDL 21, LDL 46  Other Studies Reviewed Today:  Echocardiogram 10/21/2016: Study Conclusions  - Left ventricle: The cavity size was normal. Wall thickness was   increased in a pattern of mild LVH. Systolic function was mildly   to moderately reduced. The estimated ejection fraction was in the   range of 40% to 45%. Diffuse hypokinesis. Findings consistent   with left ventricular diastolic dysfunction. Doppler parameters   are consistent with high ventricular filling pressure. - Mitral valve: Mildly thickened leaflets . There was mild   regurgitation. - Left atrium: The atrium was mildly dilated. - Right ventricle: The cavity size was mildly dilated. Pacer wire   or catheter noted in right ventricle. - Right atrium: The atrium was moderately dilated. Pacer wire or    catheter noted in right atrium. - Tricuspid valve: There was moderate regurgitation. - Systemic veins: IVC dilated with normal respiratory variation.   Estimated CVP 8 mmHg. - Pericardium, extracardiac: A trivial pericardial effusion was   identified. Features were not consistent with tamponade   physiology.  Assessment and Plan:  1.  Nonischemic cardiomyopathy, LVEF 40 to 45% by last assessment in 2017.  Follow-up echocardiogram will be obtained.  2.  Chronic systolic heart failure, symptomatically stable.  Weight is down and recent thoracic impedance was normal.  Continue with current diuretic plan.  3.  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  Patient has pending visit with Dr. Rayann Heman.  He reports no device shocks or syncope.  4.  Permanent atrial fibrillation.  He continues on Coumadin, now followed by Dr. Woody Seller.  Heart rate control regimen includes both Coreg and Cardizem CD.  Current medicines were reviewed with the patient today.   Orders  Placed This Encounter  Procedures  . EKG 12-Lead  . ECHOCARDIOGRAM COMPLETE    Disposition: Follow-up in 6 months.  Signed, Satira Sark, MD, [facility] 06/13/2018 10:02 AM    [facility] [place], [place], [place] [address] Phone: [phone]; Fax: [phone]

## 2018-06-13 ENCOUNTER — Ambulatory Visit: Payer: Medicare HMO | Admitting: Cardiology

## 2018-06-13 ENCOUNTER — Encounter: Payer: Self-pay | Admitting: Cardiology

## 2018-06-13 VITALS — BP 112/64 | HR 64 | Ht 77.0 in | Wt 236.0 lb

## 2018-06-13 DIAGNOSIS — I428 Other cardiomyopathies: Secondary | ICD-10-CM | POA: Diagnosis not present

## 2018-06-13 DIAGNOSIS — Z95810 Presence of automatic (implantable) cardiac defibrillator: Secondary | ICD-10-CM | POA: Diagnosis not present

## 2018-06-13 DIAGNOSIS — I482 Chronic atrial fibrillation: Secondary | ICD-10-CM

## 2018-06-13 DIAGNOSIS — I5022 Chronic systolic (congestive) heart failure: Secondary | ICD-10-CM

## 2018-06-13 DIAGNOSIS — I4821 Permanent atrial fibrillation: Secondary | ICD-10-CM

## 2018-06-13 NOTE — Patient Instructions (Signed)

## 2018-06-16 ENCOUNTER — Ambulatory Visit: Payer: Medicare HMO | Admitting: Internal Medicine

## 2018-06-16 ENCOUNTER — Encounter: Payer: Self-pay | Admitting: Internal Medicine

## 2018-06-16 VITALS — BP 122/80 | HR 91 | Ht 76.0 in | Wt 236.0 lb

## 2018-06-16 DIAGNOSIS — I428 Other cardiomyopathies: Secondary | ICD-10-CM

## 2018-06-16 DIAGNOSIS — Z95810 Presence of automatic (implantable) cardiac defibrillator: Secondary | ICD-10-CM

## 2018-06-16 DIAGNOSIS — I5022 Chronic systolic (congestive) heart failure: Secondary | ICD-10-CM | POA: Diagnosis not present

## 2018-06-16 NOTE — Patient Instructions (Addendum)
Medication Instructions:  Continue all current medications.  Labwork: none  Testing/Procedures: none  Follow-Up: 1 year   Any Other Special Instructions Will Be Listed Below (If Applicable). Remote monitoring is used to monitor your Pacemaker of ICD from home. This monitoring reduces the number of office visits required to check your device to one time per year. It allows [place] to keep an eye on the functioning of your device to ensure it is working properly. You are scheduled for a device check from home on 07/18/2018. You may send your transmission at any time that day. If you have a wireless device, the transmission will be sent automatically. After your physician reviews your transmission, you will receive a postcard with your next transmission date.  If you need a refill on your cardiac medications before your next appointment, please call your pharmacy.

## 2018-06-16 NOTE — Progress Notes (Signed)
PCP: Glenda Chroman, MD Primary Cardiologist: Dr Domenic Polite Primary EP: Dr Rayann Heman  [name] [name] is a 62 y.o. male who presents today for routine electrophysiology followup.  Since last being seen in our clinic, the patient reports doing very well.  Today, he denies symptoms of palpitations, chest pain, shortness of breath,  lower extremity edema, dizziness, presyncope, syncope, or ICD shocks.  The patient is otherwise without complaint today.   Past Medical History:  Diagnosis Date  . Anemia   . Atrial fibrillation ([place])   . AVM (arteriovenous malformation)    Duodenum - nonbleeding and EGD 11/13  . Cardiac arrest ([place]) 1998   ICD implanted at [facility]  . Cardiomyopathy, nonischemic (HCC)    LVEF 35-40%  . Chronic systolic heart failure ([place])   . Essential hypertension, benign   . History of colonic polyps    Colonoscopy 11/13  . Pneumonia    Severe with respiratory failure 10/13   Past Surgical History:  Procedure Laterality Date  . [place]  . COLONOSCOPY  09/26/2012   Procedure: COLONOSCOPY;  Surgeon: Beryle Beams, MD;  Location: [place];  Service: Endoscopy;  Laterality: N/A;  . Defibrillator system revision  04/12/12   MDT Protecta XT VR implanted at [facility] by Dr Deno Etienne with previously implanted system and leads extracted due to RV lead failure  . ESOPHAGOGASTRODUODENOSCOPY  09/25/2012   Procedure: ESOPHAGOGASTRODUODENOSCOPY (EGD);  Surgeon: Beryle Beams, MD;  Location: [facility] ENDOSCOPY;  Service: Endoscopy;  Laterality: N/A;    ROS- all systems are reviewed and negative except as per HPI above  Current Outpatient Medications  Medication Sig Dispense Refill  . carvedilol (COREG) 25 MG tablet TAKE 1 TABLET (25 MG TOTAL) BY MOUTH 2 (TWO) TIMES DAILY. 180 tablet 3  . Cholecalciferol (VITAMIN D3) 2000 UNITS capsule Take 2,000 Units by mouth daily.      Marland Kitchen diltiazem (CARDIZEM CD) 180 MG 24 hr capsule Take 1 capsule (180 mg total) by mouth  daily. 90 capsule 3  . finasteride (PROSCAR) 5 MG tablet Take 5 mg by mouth daily.     . furosemide (LASIX) 80 MG tablet TAKE 1 TABLET (80 MG TOTAL) BY MOUTH DAILY. 90 tablet 3  . isosorbide mononitrate (IMDUR) 30 MG 24 hr tablet Take 1 tablet (30 mg total) by mouth daily.    Marland Kitchen lisinopril (PRINIVIL,ZESTRIL) 5 MG tablet Take 5 mg by mouth daily.    . polyethylene glycol powder (GLYCOLAX/MIRALAX) powder Take 17 g by mouth daily as needed. For constipation - mix with 8 oz liquid and drink    . potassium chloride (MICRO-K) 10 MEQ CR capsule Take 1 capsule (10 mEq total) by mouth daily. 90 capsule 3  . pravastatin (PRAVACHOL) 40 MG tablet Take 40 mg by mouth at bedtime.     . tamsulosin (FLOMAX) 0.4 MG CAPS capsule Take 0.4 mg by mouth daily.    Marland Kitchen warfarin (COUMADIN) 1 MG tablet Take 1 mg by mouth daily. MANAGED BY PMD    . warfarin (COUMADIN) 5 MG tablet Take 5 mg by mouth daily. Taking with 3mg  for a total of 7mg      No current facility-administered medications for this visit.     Physical Exam: Vitals:   06/16/18 0822  BP: 122/80  Pulse: 91  SpO2: 98%  Weight: 236 lb (107 kg)  Height: 6\' 4"  (1.93 m)    GEN- The patient is well appearing, alert and oriented x 3 today.  Head- normocephalic, atraumatic Eyes-  Sclera clear, conjunctiva pink Ears- hearing intact Oropharynx- clear Lungs- Clear to ausculation bilaterally, normal work of breathing Chest- ICD pocket is well healed Heart- irregular rate and rhythm  GI- soft, NT, ND, + BS Extremities- no clubbing, cyanosis, or edema  ICD interrogation- reviewed in detail today,  See PACEART report    Wt Readings from Last 3 Encounters:  06/16/18 236 lb (107 kg)  06/13/18 236 lb (107 kg)  12/07/17 240 lb (108.9 kg)    Assessment and Plan:  1.  Chronic systolic dysfunction euvolemic today Stable on an appropriate medical regimen Normal ICD function See Pace Art report No changes today  followed in ICM device clinic  2.  Permanent afib Rate controlled chads2vasc score is 2.  3. Obesity Body mass index is 28.73 kg/m. Wt Readings from Last 3 Encounters:  06/16/18 236 lb (107 kg)  06/13/18 236 lb (107 kg)  12/07/17 240 lb (108.9 kg)  he has lost weight and making great progress  carelink Follow-up with Dr Domenic Polite as scheduled I will see again in 1 year  Thompson Grayer MD, [facility] 06/16/2018 9:01 AM

## 2018-07-03 LAB — CUP PACEART INCLINIC DEVICE CHECK
Battery Voltage: 3.02 V
HIGH POWER IMPEDANCE MEASURED VALUE: 304 Ohm
HighPow Impedance: 51 Ohm
Implantable Lead Implant Date: 20130529
Lead Channel Impedance Value: 361 Ohm
Lead Channel Pacing Threshold Amplitude: 0.75 V
Lead Channel Pacing Threshold Pulse Width: 0.4 ms
Lead Channel Sensing Intrinsic Amplitude: 3.75 mV
Lead Channel Setting Pacing Amplitude: 2.5 V
MDC IDC LEAD LOCATION: 753860
MDC IDC PG IMPLANT DT: 20130529
MDC IDC SESS DTM: 20190802124705
MDC IDC SET LEADCHNL RV PACING PULSEWIDTH: 0.4 ms
MDC IDC SET LEADCHNL RV SENSING SENSITIVITY: 0.3 mV
MDC IDC STAT BRADY RV PERCENT PACED: 4.2 %

## 2018-07-06 ENCOUNTER — Other Ambulatory Visit: Payer: Medicare HMO

## 2018-07-07 ENCOUNTER — Other Ambulatory Visit: Payer: Self-pay

## 2018-07-07 ENCOUNTER — Ambulatory Visit (INDEPENDENT_AMBULATORY_CARE_PROVIDER_SITE_OTHER): Payer: Medicare HMO

## 2018-07-07 ENCOUNTER — Telehealth: Payer: Self-pay | Admitting: *Deleted

## 2018-07-07 DIAGNOSIS — I428 Other cardiomyopathies: Secondary | ICD-10-CM

## 2018-07-07 NOTE — Telephone Encounter (Signed)
-----   Message from Satira Sark, MD sent at 07/07/2018  9:49 AM EDT ----- Results reviewed.  Findings are similar to previous testing.  Continue with current treatment plan. A copy of this test should be forwarded to Glenda Chroman, MD.

## 2018-07-07 NOTE — Telephone Encounter (Signed)
Patient informed and copy sent to PCP. 

## 2018-07-18 ENCOUNTER — Ambulatory Visit (INDEPENDENT_AMBULATORY_CARE_PROVIDER_SITE_OTHER): Payer: Medicare HMO

## 2018-07-18 DIAGNOSIS — Z95810 Presence of automatic (implantable) cardiac defibrillator: Secondary | ICD-10-CM | POA: Diagnosis not present

## 2018-07-18 DIAGNOSIS — I5022 Chronic systolic (congestive) heart failure: Secondary | ICD-10-CM | POA: Diagnosis not present

## 2018-07-18 NOTE — Progress Notes (Signed)
EPIC Encounter for ICM Monitoring  Patient Name: [name] [name] is a 62 y.o. male Date: 07/18/2018 Primary Care Physican: Glenda Chroman, MD Primary Cardiologist:McDowell Electrophysiologist: Allred Dry Weight:unknown      Heart Failure questions reviewed, pt asymptomatic.   Thoracic impedance normal.  Prescribed dosage: Furosemide 80 mg 1 tablet daily  Labs: 06/27/2017 Creatinine 0.77, BUN 14, Potassium 4.5, Sodium 132, EGFR >60 05/13/2017 Creatinine 0.77, BUN 8, Potassium 4.0, Sodium 130, EGFR >60  Recommendations: No changes.   Encouraged to call for fluid symptoms.  Follow-up plan: ICM clinic phone appointment on 08/17/2018.       Copy of ICM check sent to Dr. Rayann Heman.   3 month ICM trend: 07/18/2018    1 Year ICM trend:       Rosalene Billings, RN 07/18/2018 11:37 AM

## 2018-08-17 ENCOUNTER — Ambulatory Visit (INDEPENDENT_AMBULATORY_CARE_PROVIDER_SITE_OTHER): Payer: Medicare HMO | Admitting: *Deleted

## 2018-08-17 ENCOUNTER — Ambulatory Visit (INDEPENDENT_AMBULATORY_CARE_PROVIDER_SITE_OTHER): Payer: Medicare HMO

## 2018-08-17 DIAGNOSIS — I5022 Chronic systolic (congestive) heart failure: Secondary | ICD-10-CM | POA: Diagnosis not present

## 2018-08-17 DIAGNOSIS — Z95810 Presence of automatic (implantable) cardiac defibrillator: Secondary | ICD-10-CM | POA: Diagnosis not present

## 2018-08-17 DIAGNOSIS — I428 Other cardiomyopathies: Secondary | ICD-10-CM

## 2018-08-17 NOTE — Progress Notes (Signed)
EPIC Encounter for ICM Monitoring  Patient Name: [name] [name] is a 62 y.o. male Date: 08/17/2018 Primary Care Physican: Glenda Chroman, MD Primary Cardiologist:McDowell Electrophysiologist: Allred Dry Weight:unknown         Attempted call to patient and unable to reach.  Left detailed message, per DPR, regarding transmission.  Transmission reviewed.    Thoracic impedance normal.  Prescribed: Furosemide 80 mg 1 tablet daily  Labs: 06/27/2017 Creatinine 0.77, BUN 14, Potassium 4.5, Sodium 132, EGFR >60 05/13/2017 Creatinine 0.77, BUN 8, Potassium 4.0, Sodium 130, EGFR >60  Recommendations: Left voice mail with ICM number and encouraged to call if experiencing any fluid symptoms.  Follow-up plan: ICM clinic phone appointment on 09/21/2018.    Copy of ICM check sent to Dr. Rayann Heman.   3 month ICM trend: 08/17/2018    1 Year ICM trend:       Rosalene Billings, RN 08/17/2018 11:24 AM

## 2018-08-17 NOTE — Progress Notes (Signed)
Remote ICD transmission.   

## 2018-08-18 ENCOUNTER — Telehealth: Payer: Self-pay

## 2018-08-18 NOTE — Telephone Encounter (Signed)
Remote ICM transmission received.  Attempted call to patient and left detailed message, per DPR, regarding transmission and next ICM scheduled for 09/21/2018.  Advised to return call for any fluid symptoms or questions.    

## 2018-08-21 ENCOUNTER — Telehealth: Payer: Self-pay

## 2018-08-21 NOTE — Telephone Encounter (Signed)
Attempted patient call regards to referral needed for home remotes and no answer.

## 2018-08-21 NOTE — Telephone Encounter (Signed)
Patient called and asked if he needed a referral from PCP for remote defib checks.  He said one is needed for visits to Dr Rayann Heman and Dr Domenic Polite but he was not sure about home transmissions. Advised I was not aware of a referral being needed.  He said he did not want to get a bill for the remotes if they are not covered.

## 2018-08-21 NOTE — Telephone Encounter (Signed)
Attempted return call to patient as requested by voice mail message and no answer and no answering machine.

## 2018-08-24 NOTE — Telephone Encounter (Signed)
Patient called back to say his insurance company requires referral for monthly ICM checks.  He attempted billing department number but unable to reach.  He will try again later.

## 2018-08-24 NOTE — Telephone Encounter (Signed)
Spoke to patient.  Advised I asked billing department if a referral is needed for remote transmission and with his insurance there is a possibility it is required. Advised to call his insurance company and attempted to provide remote transmission codes but he did not want that information at this time.  Advised to call billing department and provided number if he had any further questions.

## 2018-08-26 LAB — CUP PACEART REMOTE DEVICE CHECK
Battery Voltage: 3.02 V
Brady Statistic RV Percent Paced: 4.77 %
Date Time Interrogation Session: 20191003083623
HighPow Impedance: 304 Ohm
HighPow Impedance: 54 Ohm
Implantable Lead Implant Date: 20130529
Implantable Lead Location: 753860
Implantable Lead Model: 6935
Lead Channel Impedance Value: 399 Ohm
Lead Channel Pacing Threshold Pulse Width: 0.4 ms
Lead Channel Sensing Intrinsic Amplitude: 2.75 mV
Lead Channel Setting Pacing Amplitude: 2.5 V
Lead Channel Setting Pacing Pulse Width: 0.4 ms
MDC IDC MSMT LEADCHNL RV PACING THRESHOLD AMPLITUDE: 1 V
MDC IDC MSMT LEADCHNL RV SENSING INTR AMPL: 2.75 mV
MDC IDC PG IMPLANT DT: 20130529
MDC IDC SET LEADCHNL RV SENSING SENSITIVITY: 0.3 mV

## 2018-09-21 ENCOUNTER — Ambulatory Visit (INDEPENDENT_AMBULATORY_CARE_PROVIDER_SITE_OTHER): Payer: Managed Care, Other (non HMO)

## 2018-09-21 DIAGNOSIS — I5022 Chronic systolic (congestive) heart failure: Secondary | ICD-10-CM

## 2018-09-21 DIAGNOSIS — Z95810 Presence of automatic (implantable) cardiac defibrillator: Secondary | ICD-10-CM

## 2018-09-21 NOTE — Progress Notes (Signed)
EPIC Encounter for ICM Monitoring  Patient Name: [name] [name] is a 62 y.o. male Date: 09/21/2018 Primary Care Physican: Glenda Chroman, MD Primary Cardiologist:McDowell Electrophysiologist: Allred Last Weight:230 lbs       Heart Failure questions reviewed, pt asymptomatic.   Thoracic impedance normal.   Prescribed: Furosemide 80 mg 1 tablet daily  Labs: 06/27/2017 Creatinine 0.77, BUN 14, Potassium 4.5, Sodium 132, EGFR >60 05/13/2017 Creatinine 0.77, BUN 8, Potassium 4.0, Sodium 130, EGFR >60  Recommendations: No changes.   Encouraged to call for fluid symptoms.  Follow-up plan: ICM clinic phone appointment on 10/23/2018.    Copy of ICM check sent to Dr. Rayann Heman.   3 month ICM trend: 09/21/2018    1 Year ICM trend:       Rosalene Billings, RN 09/21/2018 12:21 PM

## 2018-09-29 ENCOUNTER — Ambulatory Visit: Payer: Managed Care, Other (non HMO) | Admitting: Urology

## 2018-09-29 DIAGNOSIS — R972 Elevated prostate specific antigen [PSA]: Secondary | ICD-10-CM

## 2018-09-29 DIAGNOSIS — N401 Enlarged prostate with lower urinary tract symptoms: Secondary | ICD-10-CM

## 2018-10-23 ENCOUNTER — Ambulatory Visit (INDEPENDENT_AMBULATORY_CARE_PROVIDER_SITE_OTHER): Payer: Managed Care, Other (non HMO)

## 2018-10-23 DIAGNOSIS — Z95810 Presence of automatic (implantable) cardiac defibrillator: Secondary | ICD-10-CM

## 2018-10-23 DIAGNOSIS — I5022 Chronic systolic (congestive) heart failure: Secondary | ICD-10-CM

## 2018-10-23 NOTE — Progress Notes (Signed)
EPIC Encounter for ICM Monitoring  Patient Name: [name] [name] is a 62 y.o. male Date: 10/23/2018 Primary Care Physican: Glenda Chroman, MD Primary Cardiologist:McDowell Electrophysiologist: Allred Today's Weight: 230 lbs        Heart Failure questions reviewed, pt asymptomatic.   Thoracic impedance normal.   Prescribed: Furosemide 80 mg 1 tablet daily  Recommendations: No changes.   Encouraged to call for fluid symptoms.  Follow-up plan: ICM clinic phone appointment on 11/30/2018.   Office appointment scheduled 11/24/2018 with Dr. Domenic Polite.    Copy of ICM check sent to Dr. Rayann Heman.   3 month ICM trend: 10/23/2018    1 Year ICM trend:       Rosalene Billings, RN 10/23/2018 11:41 AM

## 2018-11-01 ENCOUNTER — Other Ambulatory Visit: Payer: Self-pay | Admitting: Cardiology

## 2018-11-16 ENCOUNTER — Ambulatory Visit (INDEPENDENT_AMBULATORY_CARE_PROVIDER_SITE_OTHER): Payer: Managed Care, Other (non HMO)

## 2018-11-16 DIAGNOSIS — I428 Other cardiomyopathies: Secondary | ICD-10-CM

## 2018-11-16 DIAGNOSIS — I5022 Chronic systolic (congestive) heart failure: Secondary | ICD-10-CM

## 2018-11-16 LAB — CUP PACEART REMOTE DEVICE CHECK
Date Time Interrogation Session: 20200102062402
HIGH POWER IMPEDANCE MEASURED VALUE: 47 Ohm
HighPow Impedance: 304 Ohm
Implantable Lead Implant Date: 20130529
Implantable Lead Model: 6935
Implantable Pulse Generator Implant Date: 20130529
Lead Channel Impedance Value: 399 Ohm
Lead Channel Pacing Threshold Amplitude: 1 V
Lead Channel Pacing Threshold Pulse Width: 0.4 ms
Lead Channel Sensing Intrinsic Amplitude: 3.125 mV
Lead Channel Sensing Intrinsic Amplitude: 3.125 mV
MDC IDC LEAD LOCATION: 753860
MDC IDC MSMT BATTERY VOLTAGE: 3 V
MDC IDC SET LEADCHNL RV PACING AMPLITUDE: 2.5 V
MDC IDC SET LEADCHNL RV PACING PULSEWIDTH: 0.4 ms
MDC IDC SET LEADCHNL RV SENSING SENSITIVITY: 0.3 mV
MDC IDC STAT BRADY RV PERCENT PACED: 4.31 %

## 2018-11-16 NOTE — Progress Notes (Signed)
Remote ICD transmission.   

## 2018-11-23 NOTE — Progress Notes (Signed)
Cardiology Office Note  Date: 11/24/2018   ID: [name] [name], DOB 1956/09/09, MRN [id number]  PCP: Glenda Chroman, MD  Primary Cardiologist: Rozann Lesches, MD   Chief Complaint  Patient presents with  . Cardiomyopathy    History of Present Illness: [name] [name] is a 63 y.o. male last seen in July 2019.  He presents for a routine follow-up visit.  Reports no change in stamina, no chest pain or progressive palpitations.  He is on Coumadin with follow-up per PCP.  He states that his last INR was "2.2."  He does not report any spontaneous bleeding problems.  He sees Dr. Rayann Heman in the device clinic, Medtronic ICD in place.  Thoracic impedance was normal in December 2019.  Follow-up echocardiogram in August 2019 revealed LVEF approximately 45% with diffuse hypokinesis, severe tricuspid regurgitation with PASP 58 mmHg.  He states that his weight has not changed significantly, he does not report orthopnea or PND.  We reviewed his medications today.  Past Medical History:  Diagnosis Date  . Anemia   . Atrial fibrillation ([place])   . AVM (arteriovenous malformation)    Duodenum - nonbleeding and EGD 11/13  . Cardiac arrest ([place]) 1998   ICD implanted at [facility]  . Cardiomyopathy, nonischemic (HCC)    LVEF 35-40%  . Chronic systolic heart failure ([place])   . Essential hypertension, benign   . History of colonic polyps    Colonoscopy 11/13  . Pneumonia    Severe with respiratory failure 10/13    Past Surgical History:  Procedure Laterality Date  . [place]  . COLONOSCOPY  09/26/2012   Procedure: COLONOSCOPY;  Surgeon: Beryle Beams, MD;  Location: [place];  Service: Endoscopy;  Laterality: N/A;  . Defibrillator system revision  04/12/12   MDT Protecta XT VR implanted at [facility] by Dr Deno Etienne with previously implanted system and leads extracted due to RV lead failure  . ESOPHAGOGASTRODUODENOSCOPY  09/25/2012   Procedure:  ESOPHAGOGASTRODUODENOSCOPY (EGD);  Surgeon: Beryle Beams, MD;  Location: [facility] ENDOSCOPY;  Service: Endoscopy;  Laterality: N/A;    Current Outpatient Medications  Medication Sig Dispense Refill  . carvedilol (COREG) 25 MG tablet TAKE 1 TABLET (25 MG TOTAL) BY MOUTH 2 (TWO) TIMES DAILY. 180 tablet 3  . Cholecalciferol (VITAMIN D3) 2000 UNITS capsule Take 2,000 Units by mouth daily.      Marland Kitchen diltiazem (CARDIZEM CD) 180 MG 24 hr capsule TAKE 1 CAPSULE BY MOUTH EVERY DAY 90 capsule 3  . finasteride (PROSCAR) 5 MG tablet Take 5 mg by mouth daily.     . furosemide (LASIX) 80 MG tablet TAKE 1 TABLET (80 MG TOTAL) BY MOUTH DAILY. 90 tablet 3  . lisinopril (PRINIVIL,ZESTRIL) 5 MG tablet Take 5 mg by mouth daily.    . polyethylene glycol powder (GLYCOLAX/MIRALAX) powder Take 17 g by mouth daily as needed. For constipation - mix with 8 oz liquid and drink    . potassium chloride (MICRO-K) 10 MEQ CR capsule Take 1 capsule (10 mEq total) by mouth daily. 90 capsule 3  . pravastatin (PRAVACHOL) 40 MG tablet Take 40 mg by mouth at bedtime.     . tamsulosin (FLOMAX) 0.4 MG CAPS capsule Take 0.4 mg by mouth daily.    Marland Kitchen warfarin (COUMADIN) 1 MG tablet Take 1 mg by mouth daily. MANAGED BY PMD    . warfarin (COUMADIN) 5 MG tablet Take 5 mg by mouth daily. Taking with 3mg  for a total  of 7mg      No current facility-administered medications for this visit.    Allergies:  Patient has no known allergies.   Social History: The patient  reports that he quit smoking about 22 years ago. His smoking use included cigarettes. He started smoking about 40 years ago. He has a 4.80 pack-year smoking history. He has never used smokeless tobacco. He reports current drug use. Drug: Marijuana. He reports that he does not drink alcohol.   ROS:  Please see the history of present illness. Otherwise, complete review of systems is positive for none.  All other systems are reviewed and negative.   Physical Exam: VS:  BP 126/78   Pulse  75   Ht 6\' 5"  (1.956 m)   Wt 236 lb (107 kg)   SpO2 98%   BMI 27.99 kg/m , BMI Body mass index is 27.99 kg/m.  Wt Readings from Last 3 Encounters:  11/24/18 236 lb (107 kg)  06/16/18 236 lb (107 kg)  06/13/18 236 lb (107 kg)    General: Tall male, appears comfortable at rest. HEENT: Conjunctiva and lids normal, oropharynx clear. Neck: Supple, no elevated JVP or carotid bruits, no thyromegaly. Lungs: Clear to auscultation, nonlabored breathing at rest. Cardiac: Irregularly irregular, no S3 or significant systolic murmur. Abdomen: Soft, nontender, bowel sounds present. Extremities: No pitting edema, distal pulses 2+. Skin: Warm and dry. Musculoskeletal: No kyphosis. Neuropsychiatric: Alert and oriented x3, affect grossly appropriate.  ECG: I personally reviewed the tracing from 06/13/2018 which showed rate controlled atrial fibrillation with low voltage.  Recent Labwork:  August 2018: Hemoglobin 12.6, platelets 236, BUN 14, creatinine 0.77, ALT 24, AST 29, potassium 4.5, triglycerides 56, cholesterol 78, HDL 21, LDL 46  Other Studies Reviewed Today:  Echocardiogram 07/07/2018: Study Conclusions  - Left ventricle: The cavity size was mildly dilated. Wall   thickness was normal. The estimated ejection fraction was 45%.   Diffuse hypokinesis. Indeterminate diastolic function. - Ventricular septum: The contour showed diastolic flattening and   systolic flattening. - Mitral valve: There was mild regurgitation. - Left atrium: The atrium was mildly dilated. - Right ventricle: The cavity size was mildly dilated. Pacer wire   or catheter noted in right ventricle. - Right atrium: The atrium was moderately dilated. - Atrial septum: No defect or patent foramen ovale was identified. - Tricuspid valve: There was severe regurgitation. - Pulmonary arteries: Systolic pressure was severely increased. PA   peak pressure: 58 mm Hg (S). - Pericardium, extracardiac: A small pericardial  effusion was   identified posterior and basal anterior to the heart.  Assessment and Plan:  1.  Chronic systolic heart failure, clinically stable with no significant change in weight.  He continues on stable diuretic regimen.  We did discuss continued efforts at walking plan and also minimizing salt in the diet.  2.  Nonischemic cardiomyopathy, LVEF 45% by follow-up assessment in August 2019.  Continue with current medical regimen.  We are stopping Imdur.  3.  Permanent atrial fibrillation.  He is on Coumadin for stroke prophylaxis with follow-up by PCP.  Heart rate control regimen includes both Coreg and Cardizem CD.  4.  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  He follows with Dr. Rayann Heman.  No device shocks or syncope.  Current medicines were reviewed with the patient today.  Disposition: Follow-up in 6 months.  Signed, Satira Sark, MD, [facility] 11/24/2018 8:45 AM    [facility], [place], [place] [address] Phone: [phone])  [phone]; Fax: [phone]

## 2018-11-24 ENCOUNTER — Ambulatory Visit: Payer: Medicare HMO | Admitting: Cardiology

## 2018-11-24 ENCOUNTER — Encounter: Payer: Self-pay | Admitting: Cardiology

## 2018-11-24 VITALS — BP 126/78 | HR 75 | Ht 77.0 in | Wt 236.0 lb

## 2018-11-24 DIAGNOSIS — I4821 Permanent atrial fibrillation: Secondary | ICD-10-CM | POA: Diagnosis not present

## 2018-11-24 DIAGNOSIS — Z95810 Presence of automatic (implantable) cardiac defibrillator: Secondary | ICD-10-CM | POA: Diagnosis not present

## 2018-11-24 DIAGNOSIS — I428 Other cardiomyopathies: Secondary | ICD-10-CM | POA: Diagnosis not present

## 2018-11-24 DIAGNOSIS — I5022 Chronic systolic (congestive) heart failure: Secondary | ICD-10-CM

## 2018-11-24 NOTE — Patient Instructions (Signed)
Medication Instructions:   Your physician has recommended you make the following change in your medication:   Stop isosorbide mononitrate (imdur)  Continue all other medications the same  Labwork:  NONE  Testing/Procedures:  NONE  Follow-Up:  Your physician recommends that you schedule a follow-up appointment in: 6 months. You will receive a reminder letter in the mail in about 4 months reminding you to call and schedule your appointment. If you don't receive this letter, please contact our office.  Any Other Special Instructions Will Be Listed Below (If Applicable).  If you need a refill on your cardiac medications before your next appointment, please call your pharmacy.

## 2018-11-30 ENCOUNTER — Ambulatory Visit (INDEPENDENT_AMBULATORY_CARE_PROVIDER_SITE_OTHER): Payer: Managed Care, Other (non HMO)

## 2018-11-30 DIAGNOSIS — Z95810 Presence of automatic (implantable) cardiac defibrillator: Secondary | ICD-10-CM | POA: Diagnosis not present

## 2018-11-30 DIAGNOSIS — I5022 Chronic systolic (congestive) heart failure: Secondary | ICD-10-CM | POA: Diagnosis not present

## 2018-11-30 NOTE — Progress Notes (Signed)
EPIC Encounter for ICM Monitoring  Patient Name: [name] [name] is a 63 y.o. male Date: 11/30/2018 Primary Care Physican: Glenda Chroman, MD Primary Cardiologist:McDowell Electrophysiologist: Allred Last Weight: 230 lbs                                                    Heart Failure questions reviewed, pt asymptomatic.   Thoracic impedance normal.   Prescribed: Furosemide 80 mg 1 tablet daily  Recommendations: No changes.   Encouraged to call for fluid symptoms.  Follow-up plan: ICM clinic phone appointment on 01/01/2019.    Copy of ICM check sent to Dr. Rayann Heman.   3 month ICM trend: 11/30/2018    1 Year ICM trend:       Rosalene Billings, RN 11/30/2018 3:33 PM

## 2019-01-01 ENCOUNTER — Ambulatory Visit (INDEPENDENT_AMBULATORY_CARE_PROVIDER_SITE_OTHER): Payer: Medicare HMO

## 2019-01-01 DIAGNOSIS — Z95810 Presence of automatic (implantable) cardiac defibrillator: Secondary | ICD-10-CM | POA: Diagnosis not present

## 2019-01-01 DIAGNOSIS — I5022 Chronic systolic (congestive) heart failure: Secondary | ICD-10-CM

## 2019-01-03 NOTE — Progress Notes (Signed)
EPIC Encounter for ICM Monitoring  Patient Name: [name] [name] is a 63 y.o. male Date: 01/03/2019 Primary Care Physican: Glenda Chroman, MD Primary Cardiologist:McDowell Electrophysiologist: Allred Last Weight:230lbs  Today's Weight: 228 lbs   Heart Failure questions reviewed, pt asymptomatic.  Denies any fluid symptoms.  Thoracic impedance normal.   Prescribed:Furosemide 80 mg 1 tablet daily  Recommendations:No changes. Encouraged to call for fluid symptoms.  Follow-up plan: ICM clinic phone appointment on3/23/2020.   Copy of ICM check sent to Dr.Allred.   3 month ICM trend: 01/01/2019    1 Year ICM trend:       Rosalene Billings, RN 01/03/2019 8:12 AM

## 2019-02-05 ENCOUNTER — Other Ambulatory Visit: Payer: Self-pay

## 2019-02-05 ENCOUNTER — Ambulatory Visit (INDEPENDENT_AMBULATORY_CARE_PROVIDER_SITE_OTHER): Payer: Medicare HMO

## 2019-02-05 DIAGNOSIS — Z95810 Presence of automatic (implantable) cardiac defibrillator: Secondary | ICD-10-CM

## 2019-02-05 DIAGNOSIS — I5022 Chronic systolic (congestive) heart failure: Secondary | ICD-10-CM

## 2019-02-06 NOTE — Progress Notes (Signed)
EPIC Encounter for ICM Monitoring  Patient Name: [name] [name] is a 63 y.o. male Date: 02/06/2019 Primary Care Physican: Glenda Chroman, MD Primary Cardiologist:McDowell Electrophysiologist: Allred 01/01/2019 Weight: 228 lbs 02/06/2019 Weight: unknown  Attempted call to patient and unable to reach.  Left message to return call. Transmission reviewed.   Thoracic impedance slightly below baseline normal.   Prescribed:Furosemide 80 mg 1 tablet daily  Labs: 06/27/2017 Creatinine 0.77, BUN 14, Potassium 4.5, Sodium 132, GFR >60  Recommendations: Unable to reach.  Follow-up plan: ICM clinic phone appointment on 03/12/2019.  Copy of ICM check sent to Dr.Allred.   3 month ICM trend: 02/05/2019    1 Year ICM trend:       Rosalene Billings, RN 02/06/2019 9:42 AM

## 2019-02-15 ENCOUNTER — Ambulatory Visit (INDEPENDENT_AMBULATORY_CARE_PROVIDER_SITE_OTHER): Payer: Medicare HMO | Admitting: *Deleted

## 2019-02-15 ENCOUNTER — Other Ambulatory Visit: Payer: Self-pay

## 2019-02-15 DIAGNOSIS — I428 Other cardiomyopathies: Secondary | ICD-10-CM | POA: Diagnosis not present

## 2019-02-15 LAB — CUP PACEART REMOTE DEVICE CHECK
Battery Voltage: 3 V
Brady Statistic RV Percent Paced: 2.66 %
Date Time Interrogation Session: 20200402062722
HighPow Impedance: 304 Ohm
HighPow Impedance: 49 Ohm
Implantable Lead Implant Date: 20130529
Implantable Lead Location: 753860
Implantable Lead Model: 6935
Implantable Pulse Generator Implant Date: 20130529
Lead Channel Impedance Value: 361 Ohm
Lead Channel Pacing Threshold Amplitude: 0.875 V
Lead Channel Pacing Threshold Pulse Width: 0.4 ms
Lead Channel Sensing Intrinsic Amplitude: 2.75 mV
Lead Channel Setting Pacing Amplitude: 2.5 V
Lead Channel Setting Pacing Pulse Width: 0.4 ms
Lead Channel Setting Sensing Sensitivity: 0.3 mV

## 2019-02-22 ENCOUNTER — Encounter: Payer: Self-pay | Admitting: Cardiology

## 2019-02-22 NOTE — Progress Notes (Signed)
Remote ICD transmission.   

## 2019-03-12 ENCOUNTER — Ambulatory Visit (INDEPENDENT_AMBULATORY_CARE_PROVIDER_SITE_OTHER): Payer: Medicare HMO

## 2019-03-12 ENCOUNTER — Other Ambulatory Visit: Payer: Self-pay

## 2019-03-12 DIAGNOSIS — I5022 Chronic systolic (congestive) heart failure: Secondary | ICD-10-CM

## 2019-03-12 DIAGNOSIS — Z95810 Presence of automatic (implantable) cardiac defibrillator: Secondary | ICD-10-CM | POA: Diagnosis not present

## 2019-03-13 ENCOUNTER — Telehealth: Payer: Self-pay

## 2019-03-13 NOTE — Progress Notes (Signed)
EPIC Encounter for ICM Monitoring  Patient Name: [name] [name] is a 63 y.o. male Date: 03/13/2019 Primary Care Physican: Glenda Chroman, MD Primary Cardiologist:McDowell Electrophysiologist: Allred 01/01/2019 Weight: 228 lbs 03/13/2019 Weight: unknown  Attempted call to patient and unable to reach.  Left message to return call. Transmission reviewed.   Thoracic impedance slightly below baseline normal.   Prescribed:Furosemide 80 mg 1 tablet daily  Labs: 06/27/2017 Creatinine 0.77, BUN 14, Potassium 4.5, Sodium 132, GFR >60  Recommendations: Unable to reach.  Follow-up plan: ICM clinic phone appointment on 04/16/2019.  Copy of ICM check sent to Dr.Allred.   3 month ICM trend: 03/12/2019    1 Year ICM trend:       Rosalene Billings, RN 03/13/2019 8:48 AM

## 2019-03-13 NOTE — Telephone Encounter (Signed)
Remote ICM transmission received.  Attempted call to patient regarding ICM remote transmission and left message to return call   

## 2019-03-16 NOTE — Progress Notes (Addendum)
Returned patient call and transmission reviewed.  He reported he is feeling fine.  He said he is receiving a bill of $26 a month for ICM follow up and asked if that could be changed because he would prefer not to have that monthly bill.  Advised the remote transmission can be reviewed every 3 months and he will only receive a call if abnormal.  He said that would be fine.  Advised next remote transmission will be 05/17/2019.  No further ICM follow at this time.  Device clinic will continue to follow every 91 days per protocol. Message sent to Dr Rayann Heman regarding ICM disenrollment.

## 2019-05-17 ENCOUNTER — Ambulatory Visit (INDEPENDENT_AMBULATORY_CARE_PROVIDER_SITE_OTHER): Payer: Medicare HMO | Admitting: *Deleted

## 2019-05-17 DIAGNOSIS — I428 Other cardiomyopathies: Secondary | ICD-10-CM

## 2019-05-17 LAB — CUP PACEART REMOTE DEVICE CHECK
Battery Voltage: 2.96 V
Brady Statistic RV Percent Paced: 2.4 %
Date Time Interrogation Session: 20200702052404
HighPow Impedance: 51 Ohm
Implantable Lead Implant Date: 20130529
Implantable Lead Location: 753860
Implantable Lead Model: 6935
Implantable Pulse Generator Implant Date: 20130529
Lead Channel Impedance Value: 361 Ohm
Lead Channel Pacing Threshold Amplitude: 1 V
Lead Channel Pacing Threshold Pulse Width: 0.4 ms
Lead Channel Sensing Intrinsic Amplitude: 2.75 mV
Lead Channel Setting Pacing Amplitude: 2.5 V
Lead Channel Setting Pacing Pulse Width: 0.4 ms
Lead Channel Setting Sensing Sensitivity: 0.3 mV

## 2019-05-24 ENCOUNTER — Encounter: Payer: Self-pay | Admitting: Cardiology

## 2019-05-24 NOTE — Progress Notes (Signed)
Remote ICD transmission.   

## 2019-06-12 ENCOUNTER — Telehealth: Payer: Self-pay | Admitting: Cardiology

## 2019-06-12 NOTE — Telephone Encounter (Signed)
Virtual Visit Pre-Appointment Phone Call  "(Name), I am calling you today to discuss your upcoming appointment. We are currently trying to limit exposure to the virus that causes COVID-19 by seeing patients at home rather than in the office."  1. "What is the BEST phone number to call the day of the visit?" - include this in appointment notes  2. Do you have or have access to (through a family member/friend) a smartphone with video capability that we can use for your visit?" a. If yes - list this number in appt notes as cell (if different from BEST phone #) and list the appointment type as a VIDEO visit in appointment notes b. If no - list the appointment type as a PHONE visit in appointment notes  3. Confirm consent - "In the setting of the current Covid19 crisis, you are scheduled for a (phone or video) visit with your provider on (date) at (time).  Just as we do with many in-office visits, in order for you to participate in this visit, we must obtain consent.  If you'd like, I can send this to your mychart (if signed up) or email for you to review.  Otherwise, I can obtain your verbal consent now.  All virtual visits are billed to your insurance company just like a normal visit would be.  By agreeing to a virtual visit, we'd like you to understand that the technology does not allow for your provider to perform an examination, and thus may limit your provider's ability to fully assess your condition. If your provider identifies any concerns that need to be evaluated in person, we will make arrangements to do so.  Finally, though the technology is pretty good, we cannot assure that it will always work on either your or our end, and in the setting of a video visit, we may have to convert it to a phone-only visit.  In either situation, we cannot ensure that we have a secure connection.  Are you willing to proceed?" STAFF: Did the patient verbally acknowledge consent to telehealth visit? Document  YES/NO here: *  4. Advise patient to be prepared - "Two hours prior to your appointment, go ahead and check your blood pressure, pulse, oxygen saturation, and your weight (if you have the equipment to check those) and write them all down. When your visit starts, your provider will ask you for this information. If you have an Apple Watch or Kardia device, please plan to have heart rate information ready on the day of your appointment. Please have a pen and paper handy nearby the day of the visit as well."  5. Give patient instructions for MyChart download to smartphone OR Doximity/Doxy.me as below if video visit (depending on what platform provider is using)  6. Inform patient they will receive a phone call 15 minutes prior to their appointment time (may be from unknown caller ID) so they should be prepared to answer    [name] [name] has been deemed a candidate for a follow-up tele-health visit to limit community exposure during the Covid-19 pandemic. I spoke with the patient via phone to ensure availability of phone/video source, confirm preferred email & phone number, and discuss instructions and expectations.  I reminded [name] [name] to be prepared with any vital sign and/or heart rhythm information that could potentially be obtained via home monitoring, at the time of his visit. I reminded [name] [name] to expect a phone call prior  to his visit.  [name] [name] 06/12/2019 2:06 PM   INSTRUCTIONS FOR DOWNLOADING THE MYCHART APP TO SMARTPHONE  - The patient must first make sure to have activated MyChart and know their login information - If Apple, go to [employer] and type in MyChart in the search bar and download the app. If Android, ask patient to go to [employer] and type in [place] in the search bar and download the app. The app is free but as with any other app downloads, their phone may require them to verify saved payment information or  Apple/Android password.  - The patient will need to then log into the app with their MyChart username and password, and select [facility] as their healthcare provider to link the account. When it is time for your visit, go to the MyChart app, find appointments, and click Begin Video Visit. Be sure to Select Allow for your device to access the Microphone and Camera for your visit. You will then be connected, and your provider will be with you shortly.  **If they have any issues connecting, or need assistance please contact MyChart service desk [phone])**  **If using a computer, in order to ensure the best quality for their visit they will need to use either of the following Internet Browsers: [employer], or Google Chrome**  IF USING DOXIMITY or DOXY.ME - The patient will receive a link just prior to their visit by text.     FULL LENGTH CONSENT FOR TELE-HEALTH VISIT   I hereby voluntarily request, consent and authorize [facility] and its employed or contracted physicians, physician assistants, nurse practitioners or other licensed health care professionals (the Practitioner), to provide me with telemedicine health care services (the Services") as deemed necessary by the treating Practitioner. I acknowledge and consent to receive the Services by the Practitioner via telemedicine. I understand that the telemedicine visit will involve communicating with the Practitioner through live audiovisual communication technology and the disclosure of certain medical information by electronic transmission. I acknowledge that I have been given the opportunity to request an in-person assessment or other available alternative prior to the telemedicine visit and am voluntarily participating in the telemedicine visit.  I understand that I have the right to withhold or withdraw my consent to the use of telemedicine in the course of my care at any time, without affecting my right to future care  or treatment, and that the Practitioner or I may terminate the telemedicine visit at any time. I understand that I have the right to inspect all information obtained and/or recorded in the course of the telemedicine visit and may receive copies of available information for a reasonable fee.  I understand that some of the potential risks of receiving the Services via telemedicine include:   Delay or interruption in medical evaluation due to technological equipment failure or disruption;  Information transmitted may not be sufficient (e.g. poor resolution of images) to allow for appropriate medical decision making by the Practitioner; and/or   In rare instances, security protocols could fail, causing a breach of personal health information.  Furthermore, I acknowledge that it is my responsibility to provide information about my medical history, conditions and care that is complete and accurate to the best of my ability. I acknowledge that Practitioner's advice, recommendations, and/or decision may be based on factors not within their control, such as incomplete or inaccurate data provided by me or distortions of diagnostic images or specimens that may result from electronic transmissions.  I understand that the practice of medicine is not an exact science and that Practitioner makes no warranties or guarantees regarding treatment outcomes. I acknowledge that I will receive a copy of this consent concurrently upon execution via email to the email address I last provided but may also request a printed copy by calling the office of [facility].    I understand that my insurance will be billed for this visit.   I have read or had this consent read to me.  I understand the contents of this consent, which adequately explains the benefits and risks of the Services being provided via telemedicine.   I have been provided ample opportunity to ask questions regarding this consent and the Services and have had  my questions answered to my satisfaction.  I give my informed consent for the services to be provided through the use of telemedicine in my medical care  By participating in this telemedicine visit I agree to the above.

## 2019-06-18 ENCOUNTER — Encounter: Payer: Self-pay | Admitting: Cardiology

## 2019-06-18 ENCOUNTER — Telehealth (INDEPENDENT_AMBULATORY_CARE_PROVIDER_SITE_OTHER): Payer: Medicare HMO | Admitting: Cardiology

## 2019-06-18 VITALS — BP 130/74 | HR 84 | Ht 77.0 in | Wt 237.0 lb

## 2019-06-18 DIAGNOSIS — I10 Essential (primary) hypertension: Secondary | ICD-10-CM

## 2019-06-18 DIAGNOSIS — I5022 Chronic systolic (congestive) heart failure: Secondary | ICD-10-CM | POA: Diagnosis not present

## 2019-06-18 DIAGNOSIS — Z95810 Presence of automatic (implantable) cardiac defibrillator: Secondary | ICD-10-CM | POA: Diagnosis not present

## 2019-06-18 DIAGNOSIS — I428 Other cardiomyopathies: Secondary | ICD-10-CM | POA: Diagnosis not present

## 2019-06-18 DIAGNOSIS — I4821 Permanent atrial fibrillation: Secondary | ICD-10-CM | POA: Diagnosis not present

## 2019-06-18 NOTE — Patient Instructions (Signed)

## 2019-06-18 NOTE — Progress Notes (Signed)
Virtual Visit via Telephone Note   This visit type was conducted due to national recommendations for restrictions regarding the COVID-19 Pandemic (e.g. social distancing) in an effort to limit this patient's exposure and mitigate transmission in our community.  Due to his co-morbid illnesses, this patient is at least at moderate risk for complications without adequate follow up.  This format is felt to be most appropriate for this patient at this time.  The patient did not have access to video technology/had technical difficulties with video requiring transitioning to audio format only (telephone).  All issues noted in this document were discussed and addressed.  No physical exam could be performed with this format.  Please refer to the patient's chart for his  consent to telehealth for [facility].   Date:  06/18/2019   ID:  [name], DOB 10/19/1956, MRN [id number]  Patient Location: Home Provider Location: Office  PCP:  Glenda Chroman, MD  Cardiologist:  Rozann Lesches, MD Electrophysiologist:  None   Evaluation Performed:  Follow-Up Visit  Chief Complaint:   Cardiac follow-up  History of Present Illness:    [name] [name] is a 63 y.o. male last seen in January.  He did not have video access and we spoke by phone today.  He tells me that he has had no progressive shortness of breath, no palpitations or syncope. He has not been as active with walking for exercise.  He continues on Coumadin with follow-up by Dr. Woody Seller.  He does not report any bleeding episodes.  He sees Dr. Rayann Heman in the device clinic, Medtronic ICD in place.  Interrogation showed normal function.  One episode of NSVT.  He reports stable weight, no obvious leg swelling, no orthopnea or PND.  We went over his medications today which are outlined below.  I did talk with him about updating an echocardiogram to reassess LVEF.  He is also likely due for a follow-up physical and lab work with PCP.  The patient  does not have symptoms concerning for COVID-19 infection (fever, chills, cough, or new shortness of breath).  He states that he wears a mask when he goes out.   Past Medical History:  Diagnosis Date  . Anemia   . Atrial fibrillation ([place])   . AVM (arteriovenous malformation)    Duodenum - nonbleeding and EGD 11/13  . Cardiac arrest ([place]) 1998   ICD implanted at [facility]  . Cardiomyopathy, nonischemic (HCC)    LVEF 35-40%  . Chronic systolic heart failure ([place])   . Essential hypertension   . History of colonic polyps    Colonoscopy 11/13  . Pneumonia    Severe with respiratory failure 10/13   Past Surgical History:  Procedure Laterality Date  . [place]  . COLONOSCOPY  09/26/2012   Procedure: COLONOSCOPY;  Surgeon: Beryle Beams, MD;  Location: [facility];  Service: Endoscopy;  Laterality: N/A;  . Defibrillator system revision  04/12/12   MDT Protecta XT VR implanted at [facility] by Dr Deno Etienne with previously implanted system and leads extracted due to RV lead failure  . ESOPHAGOGASTRODUODENOSCOPY  09/25/2012   Procedure: ESOPHAGOGASTRODUODENOSCOPY (EGD);  Surgeon: Beryle Beams, MD;  [facility] ENDOSCOPY;  Service: Endoscopy;  Laterality: N/A;     Current Meds  Medication Sig  . carvedilol (COREG) 25 MG tablet TAKE 1 TABLET (25 MG TOTAL) BY MOUTH 2 (TWO) TIMES DAILY.  Marland Kitchen Cholecalciferol (VITAMIN D3) 2000 UNITS capsule Take 2,000 Units by mouth daily.    Marland Kitchen  diltiazem (CARDIZEM CD) 180 MG 24 hr capsule TAKE 1 CAPSULE BY MOUTH EVERY DAY  . finasteride (PROSCAR) 5 MG tablet Take 5 mg by mouth daily.   . furosemide (LASIX) 80 MG tablet TAKE 1 TABLET (80 MG TOTAL) BY MOUTH DAILY.  Marland Kitchen lisinopril (PRINIVIL,ZESTRIL) 5 MG tablet Take 5 mg by mouth daily.  . polyethylene glycol powder (GLYCOLAX/MIRALAX) powder Take 17 g by mouth daily as needed. For constipation - mix with 8 oz liquid and drink  . potassium chloride (MICRO-K) 10 MEQ CR capsule Take 1 capsule (10  mEq total) by mouth daily.  . pravastatin (PRAVACHOL) 40 MG tablet Take 40 mg by mouth at bedtime.   . tamsulosin (FLOMAX) 0.4 MG CAPS capsule Take 0.4 mg by mouth daily.  Marland Kitchen warfarin (COUMADIN) 1 MG tablet Take 1 mg by mouth daily. MANAGED BY PMD  . warfarin (COUMADIN) 5 MG tablet Take 5 mg by mouth daily. Taking with 3mg  for a total of 7mg      Allergies:   Patient has no known allergies.   Social History   Tobacco Use  . Smoking status: Former Smoker    Packs/day: 0.80    Years: 6.00    Pack years: 4.80    Types: Cigarettes    Start date: 05/13/1978    Quit date: 11/15/1996    Years since quitting: 22.6  . Smokeless tobacco: Never Used  Substance Use Topics  . Alcohol use: No    Alcohol/week: 0.0 standard drinks  . Drug use: Yes    Types: Marijuana    Comment: Many years ago     Family Hx: The patient's family history includes Alzheimer's disease in his father; Diabetes in his brother and mother; Hypertension in his brother and father.  ROS:   Please see the history of present illness. All other systems reviewed and are negative.   Prior CV studies:   The following studies were reviewed today:  Echocardiogram 07/07/2018: Study Conclusions  - Left ventricle: The cavity size was mildly dilated. Wall thickness was normal. The estimated ejection fraction was 45%. Diffuse hypokinesis. Indeterminate diastolic function. - Ventricular septum: The contour showed diastolic flattening and systolic flattening. - Mitral valve: There was mild regurgitation. - Left atrium: The atrium was mildly dilated. - Right ventricle: The cavity size was mildly dilated. Pacer wire or catheter noted in right ventricle. - Right atrium: The atrium was moderately dilated. - Atrial septum: No defect or patent foramen ovale was identified. - Tricuspid valve: There was severe regurgitation. - Pulmonary arteries: Systolic pressure was severely increased. PA peak pressure: 58 mm Hg (S). -  Pericardium, extracardiac: A small pericardial effusion was identified posterior and basal anterior to the heart.  Labs/Other Tests and Data Reviewed:    EKG:  An ECG dated 06/13/2018 was personally reviewed today and demonstrated:  Rate controlled atrial fibrillation with low voltage.  Recent Labs:  August 2018: Hemoglobin 12.6, platelets 236, BUN 14, creatinine 0.77, ALT 24, AST 29, potassium 4.5, triglycerides 56, cholesterol 78, HDL 21, LDL 46  Wt Readings from Last 3 Encounters:  06/18/19 237 lb (107.5 kg)  11/24/18 236 lb (107 kg)  06/16/18 236 lb (107 kg)     Objective:    Vital Signs:  BP 130/74   Pulse 84   Ht 6\' 5"  (1.956 m)   Wt 237 lb (107.5 kg)   SpO2 98%   BMI 28.10 kg/m    Patient spoke in full sentences, not short of breath. No audible wheezing  or coughing. Normal speech pattern.  ASSESSMENT & PLAN:    1.  Nonischemic cardiomyopathy with LVEF approximately 45% as of August 2019.  Plan to follow-up echocardiogram for reevaluation.  Can consider further medication adjustments.  2.  Chronic systolic heart failure.  Weight has been stable.  Current medications include Coreg, lisinopril, and Lasix with potassium supplements.  3.  Permanent atrial fibrillation.  Patient reports no palpitations and heart rate control looks good.  He is on Cardizem CD in addition to his beta-blocker.  4.  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 Rayann Heman.  He denies any shocks or syncope.  COVID-19 Education: The signs and symptoms of COVID-19 were discussed with the patient and how to seek care for testing (follow up with PCP or arrange E-visit). The importance of social distancing was discussed today.  Time:   Today, I have spent 9 minutes with the patient with telehealth technology discussing the above problems.     Medication Adjustments/Labs and Tests Ordered: Current medicines are reviewed at length with the patient today.  Concerns regarding medicines are outlined  above.   Tests Ordered: Orders Placed This Encounter  Procedures  . ECHOCARDIOGRAM COMPLETE    Medication Changes: No orders of the defined types were placed in this encounter.   Follow Up:  In Person 6 months in the [place] office.  Signed, Rozann Lesches, MD  06/18/2019 8:50 AM    [place]

## 2019-06-22 ENCOUNTER — Encounter: Payer: Medicare HMO | Admitting: Internal Medicine

## 2019-06-25 ENCOUNTER — Telehealth: Payer: Self-pay | Admitting: Cardiology

## 2019-06-25 NOTE — Telephone Encounter (Signed)
Calling to correct med list

## 2019-06-25 NOTE — Telephone Encounter (Signed)
°  Precert needed for: Echo   Location: [facility]    Date: Sept 3, 2020

## 2019-06-25 NOTE — Telephone Encounter (Signed)
Updated medication list - is not taking lisinopril (pcp discontinued) will forward to provider as Juluis Rainier

## 2019-06-25 NOTE — Telephone Encounter (Signed)
    Thank you for the update. By review of records, he has a history of NICM and repeat echocardiogram is pending. Would await results for if EF remains reduced, may need to consider restarting this at a lower dose.   Signed, Erma Heritage, PA-C 06/25/2019, 5:25 PM Pager: [phone]

## 2019-07-19 ENCOUNTER — Other Ambulatory Visit: Payer: Self-pay

## 2019-07-19 ENCOUNTER — Ambulatory Visit (INDEPENDENT_AMBULATORY_CARE_PROVIDER_SITE_OTHER): Payer: Medicare HMO

## 2019-07-19 DIAGNOSIS — I428 Other cardiomyopathies: Secondary | ICD-10-CM

## 2019-07-25 ENCOUNTER — Telehealth: Payer: Self-pay | Admitting: *Deleted

## 2019-07-25 DIAGNOSIS — I10 Essential (primary) hypertension: Secondary | ICD-10-CM

## 2019-07-25 MED ORDER — CARVEDILOL 25 MG PO TABS: 37.5000 mg | ORAL_TABLET | Freq: Two times a day (BID) | ORAL | 1 refills | Status: DC

## 2019-07-25 NOTE — Telephone Encounter (Signed)
-----   Message from Satira Sark, MD sent at 07/20/2019  9:46 AM EDT ----- Results reviewed.  Follow-up study shows LVEF 30 to 35% range which is reduced in comparison to the previous evaluation, also increased pulmonary pressures.  We are going to need to make medication changes.  Please have him get a BMET with office follow-up over the next month depending on availability.  I see that he is no longer on lisinopril per review of the chart.  Please have him stop Cardizem CD and increase Coreg to 1-1/2 tablets twice daily.  We can discuss further changes when I see him back.

## 2019-07-25 NOTE — Telephone Encounter (Signed)
Pt voiced understanding - updated medication list - mailed lab orders (will have done at pcp or [facility]) - f/u scheduled

## 2019-08-14 ENCOUNTER — Telehealth: Payer: Self-pay | Admitting: *Deleted

## 2019-08-14 MED ORDER — ENTRESTO 24-26 MG PO TABS: 1.0000 | ORAL_TABLET | Freq: Two times a day (BID) | ORAL | 6 refills | Status: DC

## 2019-08-14 NOTE — Telephone Encounter (Signed)
Patient informed. Entresto sent to pharmacy. Copy sent to PCP

## 2019-08-14 NOTE — Telephone Encounter (Signed)
-----   Message from Satira Sark, MD sent at 08/10/2019  9:19 AM EDT ----- Results reviewed.  Renal function and potassium are normal.  Would suggest starting Entresto 24/26 mg twice daily (do not resume lisinopril).  He should already have an office visit scheduled for follow-up and we can see how he is doing.

## 2019-08-16 ENCOUNTER — Ambulatory Visit (INDEPENDENT_AMBULATORY_CARE_PROVIDER_SITE_OTHER): Payer: Medicare HMO | Admitting: *Deleted

## 2019-08-16 DIAGNOSIS — I428 Other cardiomyopathies: Secondary | ICD-10-CM

## 2019-08-16 LAB — CUP PACEART REMOTE DEVICE CHECK
Battery Voltage: 2.96 V
Brady Statistic RV Percent Paced: 1.91 %
Date Time Interrogation Session: 20201001052205
HighPow Impedance: 51 Ohm
Implantable Lead Implant Date: 20130529
Implantable Lead Location: 753860
Implantable Lead Model: 6935
Implantable Pulse Generator Implant Date: 20130529
Lead Channel Impedance Value: 418 Ohm
Lead Channel Pacing Threshold Amplitude: 1 V
Lead Channel Pacing Threshold Pulse Width: 0.4 ms
Lead Channel Sensing Intrinsic Amplitude: 2.625 mV
Lead Channel Setting Pacing Amplitude: 2.5 V
Lead Channel Setting Pacing Pulse Width: 0.4 ms
Lead Channel Setting Sensing Sensitivity: 0.3 mV

## 2019-08-17 ENCOUNTER — Telehealth (INDEPENDENT_AMBULATORY_CARE_PROVIDER_SITE_OTHER): Payer: Medicare HMO | Admitting: Internal Medicine

## 2019-08-17 DIAGNOSIS — I10 Essential (primary) hypertension: Secondary | ICD-10-CM

## 2019-08-17 DIAGNOSIS — I5022 Chronic systolic (congestive) heart failure: Secondary | ICD-10-CM | POA: Diagnosis not present

## 2019-08-17 DIAGNOSIS — I4821 Permanent atrial fibrillation: Secondary | ICD-10-CM | POA: Diagnosis not present

## 2019-08-17 NOTE — Progress Notes (Signed)
Electrophysiology TeleHealth Note   Due to national recommendations of social distancing due to [place] 19, an audio telehealth visit is felt to be most appropriate for this patient at this time.  Verbal consent was obtained by me for the telehealth visit today.  The patient does not have capability for a virtual visit.  A phone visit is therefore required today.   Date:  08/17/2019   ID:  [name] [name], DOB Oct 15, 1956, MRN [id number]  Location: patient's home  Provider location:  [facility]  Evaluation Performed: Follow-up visit  PCP:  Glenda Chroman, MD   Electrophysiologist:  Dr Rayann Heman  Chief Complaint:  Device follow up  History of Present Illness:    [name] [name] is a 63 y.o. male who presents via telehealth conferencing today.  Since last being seen in our clinic, the patient reports doing very well. He feels that he is improved after weight loss.  Today, he denies symptoms of palpitations, chest pain, shortness of breath (above baseline),  lower extremity edema, dizziness, presyncope, or syncope.  The patient is otherwise without complaint today.  The patient denies symptoms of fevers, chills, cough, or new SOB worrisome for COVID 19.  Past Medical History:  Diagnosis Date  . Anemia   . Atrial fibrillation ([place])   . AVM (arteriovenous malformation)    Duodenum - nonbleeding and EGD 11/13  . Cardiac arrest ([place]) 1998   ICD implanted at [facility]  . Cardiomyopathy, nonischemic (HCC)    LVEF 35-40%  . Chronic systolic heart failure ([place])   . Essential hypertension   . History of colonic polyps    Colonoscopy 11/13  . Pneumonia    Severe with respiratory failure 10/13    Past Surgical History:  Procedure Laterality Date  . [place]  . COLONOSCOPY  09/26/2012   Procedure: COLONOSCOPY;  Surgeon: Beryle Beams, MD;  Location: [facility];  Service: Endoscopy;  Laterality: N/A;  . Defibrillator system revision  04/12/12   MDT  Protecta XT VR implanted at [facility] by Dr Deno Etienne with previously implanted system and leads extracted due to RV lead failure  . ESOPHAGOGASTRODUODENOSCOPY  09/25/2012   Procedure: ESOPHAGOGASTRODUODENOSCOPY (EGD);  Surgeon: Beryle Beams, MD;  Location: [facility] ENDOSCOPY;  Service: Endoscopy;  Laterality: N/A;    Current Outpatient Medications  Medication Sig Dispense Refill  . carvedilol (COREG) 25 MG tablet Take 1.5 tablets (37.5 mg total) by mouth 2 (two) times daily with a meal. 270 tablet 1  . Cholecalciferol (VITAMIN D3) 2000 UNITS capsule Take 2,000 Units by mouth daily.      . finasteride (PROSCAR) 5 MG tablet Take 5 mg by mouth daily.     . furosemide (LASIX) 80 MG tablet TAKE 1 TABLET (80 MG TOTAL) BY MOUTH DAILY. 90 tablet 3  . polyethylene glycol powder (GLYCOLAX/MIRALAX) powder Take 17 g by mouth daily as needed. For constipation - mix with 8 oz liquid and drink    . potassium chloride (MICRO-K) 10 MEQ CR capsule Take 1 capsule (10 mEq total) by mouth daily. 90 capsule 3  . pravastatin (PRAVACHOL) 40 MG tablet Take 40 mg by mouth at bedtime.     . sacubitril-valsartan (ENTRESTO) 24-26 MG Take 1 tablet by mouth 2 (two) times daily. 60 tablet 6  . warfarin (COUMADIN) 1 MG tablet Take 1 mg by mouth daily. MANAGED BY PMD    . warfarin (COUMADIN) 5 MG tablet Take 5 mg by mouth daily. Taking  with 3mg  for a total of 7mg      No current facility-administered medications for this visit.     Allergies:   Patient has no known allergies.   Social History:  The patient  reports that he quit smoking about 22 years ago. His smoking use included cigarettes. He started smoking about 41 years ago. He has a 4.80 pack-year smoking history. He has never used smokeless tobacco. He reports current drug use. Drug: Marijuana. He reports that he does not drink alcohol.   Family History:  The patient's family history includes Alzheimer's disease in his father; Diabetes in his brother and mother; Hypertension  in his brother and father.   ROS:  Please see the history of present illness.   All other systems are personally reviewed and negative.    Exam:    Vital Signs:  There were no vitals taken for this visit.  Well sounding and appearing, alert and conversant, regular work of breathing   Labs/Other Tests and Data Reviewed:    Recent Labs: No results found for requested labs within last 8760 hours.   Wt Readings from Last 3 Encounters:  06/18/19 237 lb (107.5 kg)  11/24/18 236 lb (107 kg)  06/16/18 236 lb (107 kg)     Last device remote is reviewed from [place] PDF which reveals normal device function, no arrhythmias    ASSESSMENT & PLAN:    1.  Chronic systolic heart failure Euvolemic by symptoms Continue current medical therapy Normal device function See recent [facility] report Continue follow up in ICM clinic Discussed BeatHF with patient today, he would like to hold off for now on making changes. If he has worsening HF symptoms, this would be a reasonable option  2.  Permanent AF V rates controlled CHADS2VASC is 2  3.  Obesity Weight loss encouraged    Follow-up:  [facility], ICM clinic, 1 year with me    Patient Risk:  after full review of this patients clinical status, I feel that they are at moderate risk at this time.  Today, I have spent 15 minutes with the patient with telehealth technology discussing arrhythmia management .    Army Fossa, MD  08/17/2019 9:46 AM     [facility] [address] [place] [address] [phone] (office) [phone] (fax)

## 2019-08-22 ENCOUNTER — Encounter: Payer: Self-pay | Admitting: Cardiology

## 2019-08-22 NOTE — Progress Notes (Signed)
Remote ICD transmission.   

## 2019-08-23 ENCOUNTER — Telehealth: Payer: Self-pay | Admitting: Cardiology

## 2019-08-23 NOTE — Telephone Encounter (Signed)

## 2019-08-24 ENCOUNTER — Encounter: Payer: Self-pay | Admitting: Cardiology

## 2019-08-24 ENCOUNTER — Ambulatory Visit: Payer: Medicare HMO | Admitting: Cardiology

## 2019-08-24 ENCOUNTER — Other Ambulatory Visit: Payer: Self-pay

## 2019-08-24 ENCOUNTER — Ambulatory Visit (INDEPENDENT_AMBULATORY_CARE_PROVIDER_SITE_OTHER): Payer: Medicare HMO | Admitting: Cardiology

## 2019-08-24 VITALS — BP 97/67 | HR 84 | Ht 77.0 in | Wt 232.8 lb

## 2019-08-24 DIAGNOSIS — I428 Other cardiomyopathies: Secondary | ICD-10-CM

## 2019-08-24 DIAGNOSIS — I10 Essential (primary) hypertension: Secondary | ICD-10-CM

## 2019-08-24 DIAGNOSIS — I5022 Chronic systolic (congestive) heart failure: Secondary | ICD-10-CM | POA: Diagnosis not present

## 2019-08-24 DIAGNOSIS — Z95810 Presence of automatic (implantable) cardiac defibrillator: Secondary | ICD-10-CM

## 2019-08-24 DIAGNOSIS — I4821 Permanent atrial fibrillation: Secondary | ICD-10-CM | POA: Diagnosis not present

## 2019-08-24 MED ORDER — DIGOXIN 125 MCG PO TABS: 0.1250 mg | ORAL_TABLET | Freq: Every day | ORAL | 1 refills | Status: DC

## 2019-08-24 MED ORDER — FUROSEMIDE 40 MG PO TABS: 40.0000 mg | ORAL_TABLET | Freq: Every day | ORAL | 1 refills | Status: DC

## 2019-08-24 NOTE — Progress Notes (Signed)
Cardiology Office Note  Date: 08/24/2019   ID: [name] [name], DOB November 20, 1955, MRN [id number]  PCP:  Glenda Chroman, MD  Cardiologist:  Rozann Lesches, MD Electrophysiologist:  None   Chief Complaint  Patient presents with  . Cardiac follow-up    History of Present Illness: [name] [name] is a 63 y.o. male last assessed via telehealth encounter in August.  He presents for a follow-up visit today.  His blood pressure has come down with the medication adjustments made, Coreg was increased and we started him on Entresto instead of previous lisinopril.  He was also taken off Cardizem CD.  He reports mild lightheadedness, no syncope.  Remains on Coumadin with follow-up by Dr. Woody Seller.  No reported bleeding problems.  He follows with Dr. Rayann Heman in the device clinic, Medtronic ICD in place.  He had a recent telehealth encounter with him.  Device function normal, no recent shocks or syncope.  Recent follow-up echocardiogram in September showed LVEF 30 to 35% range with mildly reduced RV contraction, severe biatrial enlargement, and severe pulmonary hypertension.  His weight is down about 5 pounds since August, he has only mild ankle edema.  Past Medical History:  Diagnosis Date  . Anemia   . Atrial fibrillation ([place])   . AVM (arteriovenous malformation)    Duodenum - nonbleeding and EGD 11/13  . Cardiac arrest ([place]) 1998   ICD implanted at [facility]  . Cardiomyopathy, nonischemic (HCC)    LVEF 35-40%  . Chronic systolic heart failure ([place])   . Essential hypertension   . History of colonic polyps    Colonoscopy 11/13  . Pneumonia    Severe with respiratory failure 10/13    Past Surgical History:  Procedure Laterality Date  . [place]  . COLONOSCOPY  09/26/2012   Procedure: COLONOSCOPY;  Surgeon: Beryle Beams, MD;  Location: [facility];  Service: Endoscopy;  Laterality: N/A;  . Defibrillator system revision  04/12/12   MDT Protecta XT VR  implanted at [facility] by Dr Deno Etienne with previously implanted system and leads extracted due to RV lead failure  . ESOPHAGOGASTRODUODENOSCOPY  09/25/2012   Procedure: ESOPHAGOGASTRODUODENOSCOPY (EGD);  Surgeon: Beryle Beams, MD;  Location[facility] ENDOSCOPY;  Service: Endoscopy;  Laterality: N/A;    Current Outpatient Medications  Medication Sig Dispense Refill  . carvedilol (COREG) 25 MG tablet Take 1.5 tablets (37.5 mg total) by mouth 2 (two) times daily with a meal. 270 tablet 1  . Cholecalciferol (VITAMIN D3) 2000 UNITS capsule Take 2,000 Units by mouth daily.      . finasteride (PROSCAR) 5 MG tablet Take 5 mg by mouth daily.     . polyethylene glycol powder (GLYCOLAX/MIRALAX) powder Take 17 g by mouth daily as needed. For constipation - mix with 8 oz liquid and drink    . potassium chloride (MICRO-K) 10 MEQ CR capsule Take 1 capsule (10 mEq total) by mouth daily. 90 capsule 3  . pravastatin (PRAVACHOL) 40 MG tablet Take 40 mg by mouth at bedtime.     . sacubitril-valsartan (ENTRESTO) 24-26 MG Take 1 tablet by mouth 2 (two) times daily. 60 tablet 6  . warfarin (COUMADIN) 1 MG tablet Take 1 mg by mouth daily. MANAGED BY PMD    . warfarin (COUMADIN) 5 MG tablet Take 5 mg by mouth daily. Taking with 3mg  for a total of 7mg     . digoxin (LANOXIN) 0.125 MG tablet Take 1 tablet (0.125 mg total) by mouth daily.  90 tablet 1  . furosemide (LASIX) 40 MG tablet Take 1 tablet (40 mg total) by mouth daily. 90 tablet 1   No current facility-administered medications for this visit.    Allergies:  Patient has no known allergies.   Social History: The patient  reports that he quit smoking about 22 years ago. His smoking use included cigarettes. He started smoking about 41 years ago. He has a 4.80 pack-year smoking history. He has never used smokeless tobacco. He reports current drug use. Drug: Marijuana. He reports that he does not drink alcohol.   ROS:  Please see the history of present illness. Otherwise,  complete review of systems is positive for none.  All other systems are reviewed and negative.   Physical Exam: VS:  BP 97/67   Pulse 84   Ht 6\' 5"  (1.956 m)   Wt 232 lb 12.8 oz (105.6 kg)   SpO2 99%   BMI 27.61 kg/m , BMI Body mass index is 27.61 kg/m.  Wt Readings from Last 3 Encounters:  08/24/19 232 lb 12.8 oz (105.6 kg)  06/18/19 237 lb (107.5 kg)  11/24/18 236 lb (107 kg)    General: Tall male, appears comfortable at rest. HEENT: Conjunctiva and lids normal, wearing a mask. Neck: Supple, no elevated JVP or carotid bruits, no thyromegaly. Lungs: Clear to auscultation, nonlabored breathing at rest. Cardiac: Irregularly irregular, no S3, soft systolic murmur, no pericardial rub. Abdomen: Soft, nontender, bowel sounds present. Extremities: Mild ankle edema, distal pulses 2+. Skin: Warm and dry. Musculoskeletal: No kyphosis. Neuropsychiatric: Alert and oriented x3, affect grossly appropriate.  ECG:  An ECG dated 06/13/2018 was personally reviewed today and demonstrated:  Rate controlled atrial fibrillation with low voltage.  Recent Labwork:  September 2020: BUN 21, creatinine 1.12, potassium 4.2  Other Studies Reviewed Today:  Echocardiogram 07/19/2019:  1. The left ventricle has moderate-severely reduced systolic function, with an ejection fraction of 30-35%. The cavity size was normal. Left ventricular diastolic Doppler parameters are indeterminate.  2. Diffuse hypokinesis. The anteroseptal and anterior wall are akineitic.  3. The right ventricle has mildly reduced systolic function. The cavity was moderately enlarged. There is no increase in right ventricular wall thickness.  4. Left atrial size was severely dilated.  5. Right atrial size was severely dilated.  6. Small pericardial effusion.  7. The pericardial effusion is circumferential.  8. The mitral valve is abnormal. Mild thickening of the mitral valve leaflet. Mild calcification of the mitral valve leaflet. There  is mild mitral annular calcification present. No evidence of mitral valve stenosis.  9. The tricuspid valve is abnormal. Tricuspid valve regurgitation is severe. 10. The aortic valve is tricuspid. Mild thickening of the aortic valve. Mild calcification of the aortic valve. No stenosis of the aortic valve. 11. The aorta is normal unless otherwise noted. 12. The aortic root is normal in size and structure. 13. Pulmonary hypertension is severaly elevated, PASP is 76 mmHg. 14. The inferior vena cava was dilated in size with <50% respiratory variability.  Assessment and Plan:  1.  Chronic systolic heart failure with nonischemic cardiomyopathy and LVEF 30 to 35% by recent evaluation, also mildly reduced RV contraction..  Weight is down about 5 pounds and he has only mild ankle edema today.  We have adjusted his medications recently including initiation of Entresto in lieu of previous ACE inhibitor, increase in Coreg, and discontinuation of Cardizem CD.  His blood pressure has come down, systolic is in the 0000000 today.  Plan is  to cut back Lasix to 40 mg daily with continued potassium supplements.  Also add Lanoxin 0.125 mg daily.  Follow-up BMET now.  Holding off on addition of Aldactone.  Office follow-up arranged.  2.  Permanent atrial fibrillation.  Heart rate has come up since discontinuation of Cardizem CD.  We did uptitrate his Coreg, but blood pressure currently limits further up titration.  Adding Lanoxin as discussed above.  Continue Coumadin with follow-up by Dr. Manuella Ghazi.  3.  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  No device shocks or syncope.  Keep follow-up with Dr. Rayann Heman.  Medication Adjustments/Labs and Tests Ordered: Current medicines are reviewed at length with the patient today.  Concerns regarding medicines are outlined above.   Tests Ordered: Orders Placed This Encounter  Procedures  . Basic metabolic panel  . EKG 12-Lead    Medication Changes: Meds ordered this encounter  Medications  .  digoxin (LANOXIN) 0.125 MG tablet    Sig: Take 1 tablet (0.125 mg total) by mouth daily.    Dispense:  90 tablet    Refill:  1    08/24/2019 NEW  . furosemide (LASIX) 40 MG tablet    Sig: Take 1 tablet (40 mg total) by mouth daily.    Dispense:  90 tablet    Refill:  1    08/24/2019 dose decrease    Disposition:  Follow up 1 month in the [place] office.  Signed, Satira Sark, MD, [facility] 08/24/2019 8:40 AM    [facility] at [place], [place], [place] [address] Phone: [phone]; Fax: [phone]

## 2019-08-24 NOTE — Patient Instructions (Addendum)
Medication Instructions:   Your physician has recommended you make the following change in your medication:   Decrease furosemide to 40 mg by mouth daily. You may break your 80 mg tablet in half daily until they are finished.  Start digoxin 0.125 mg by mouth daily  Continue all other medications the same  Labwork:  Your physician recommends that you return for lab work in: as soon as possible to check your BMET. You may have this done today at [facility].  Testing/Procedures:  NONE  Follow-Up:  Your physician recommends that you schedule a follow-up appointment in: 1 month.  Any Other Special Instructions Will Be Listed Below (If Applicable).  If you need a refill on your cardiac medications before your next appointment, please call your pharmacy.

## 2019-08-28 ENCOUNTER — Telehealth: Payer: Self-pay | Admitting: *Deleted

## 2019-08-28 NOTE — Telephone Encounter (Signed)
-----   Message from Satira Sark, MD sent at 08/27/2019  5:40 PM EDT ----- Results reviewed. Potassium and renal function are normal. Continue with current follow-up plan.

## 2019-08-28 NOTE — Telephone Encounter (Signed)
Patient informed. Copy sent to PCP °

## 2019-09-23 NOTE — Progress Notes (Signed)
Cardiology Office Note  Date: 09/24/2019   ID: [name] [name], DOB 02-Mar-1956, MRN [id number]  PCP:  Glenda Chroman, MD  Cardiologist:  Rozann Lesches, MD Electrophysiologist:  None   Chief Complaint  Patient presents with  . Cardiac follow-up    History of Present Illness: [name] [name] is a 63 y.o. male last seen in October.  He presents for a follow-up visit.  He does not report any leg swelling, no orthopnea or PND.  Seems to be tolerating the medication adjustments that we have made recently.  He reports NYHA class II dyspnea with low-level activity, has been fairly sedentary.  I talked with him about a walking plan or using his stationary bicycle at home.  He continues on Coumadin with follow-up by Dr. Woody Seller.  He states that PT/INR has been fluctuating.  I asked him to talk with his PCP about the possibility of switching to [place] if financially reasonable for him.  He sees Dr. Rayann Heman in the device clinic, Medtronic ICD in place.  He does not report any device shocks or syncope.  I reviewed his medications which are outlined below.  Past Medical History:  Diagnosis Date  . Anemia   . Atrial fibrillation ([place])   . AVM (arteriovenous malformation)    Duodenum - nonbleeding and EGD 11/13  . Cardiac arrest ([place]) 1998   ICD implanted at [facility]  . Cardiomyopathy, nonischemic (HCC)    LVEF 35-40%  . Chronic systolic heart failure ([place])   . Essential hypertension   . History of colonic polyps    Colonoscopy 11/13  . Pneumonia    Severe with respiratory failure 10/13    Past Surgical History:  Procedure Laterality Date  . [place]  . COLONOSCOPY  09/26/2012   Procedure: COLONOSCOPY;  Surgeon: Beryle Beams, MD;  Location: [facility];  Service: Endoscopy;  Laterality: N/A;  . Defibrillator system revision  04/12/12   MDT Protecta XT VR implanted at [facility] by Dr Deno Etienne with previously implanted system and leads extracted due to RV lead  failure  . ESOPHAGOGASTRODUODENOSCOPY  09/25/2012   Procedure: ESOPHAGOGASTRODUODENOSCOPY (EGD);  Surgeon: Beryle Beams, MD;  Location: [facility] ENDOSCOPY;  Service: Endoscopy;  Laterality: N/A;    Current Outpatient Medications  Medication Sig Dispense Refill  . carvedilol (COREG) 25 MG tablet Take 1.5 tablets (37.5 mg total) by mouth 2 (two) times daily with a meal. 270 tablet 1  . Cholecalciferol (VITAMIN D3) 2000 UNITS capsule Take 2,000 Units by mouth daily.      . digoxin (LANOXIN) 0.125 MG tablet Take 1 tablet (0.125 mg total) by mouth daily. 90 tablet 1  . finasteride (PROSCAR) 5 MG tablet Take 5 mg by mouth daily.     . furosemide (LASIX) 40 MG tablet Take 1 tablet (40 mg total) by mouth daily. 90 tablet 1  . polyethylene glycol powder (GLYCOLAX/MIRALAX) powder Take 17 g by mouth daily as needed. For constipation - mix with 8 oz liquid and drink    . potassium chloride (MICRO-K) 10 MEQ CR capsule Take 1 capsule (10 mEq total) by mouth daily. 90 capsule 3  . pravastatin (PRAVACHOL) 40 MG tablet Take 40 mg by mouth at bedtime.     . sacubitril-valsartan (ENTRESTO) 24-26 MG Take 1 tablet by mouth 2 (two) times daily. 60 tablet 6  . warfarin (COUMADIN) 1 MG tablet Take 1 mg by mouth daily. MANAGED BY PMD    . warfarin (COUMADIN) 5  MG tablet Take 5 mg by mouth daily. Taking with 3mg  for a total of 7mg     . spironolactone (ALDACTONE) 25 MG tablet Take 0.5 tablets (12.5 mg total) by mouth daily. 45 tablet 1   No current facility-administered medications for this visit.    Allergies:  Patient has no known allergies.   Social History: The patient  reports that he quit smoking about 22 years ago. His smoking use included cigarettes. He started smoking about 41 years ago. He has a 4.80 pack-year smoking history. He has never used smokeless tobacco. He reports current drug use. Drug: Marijuana. He reports that he does not drink alcohol.   ROS:  Please see the history of present illness. Otherwise,  complete review of systems is positive for none.  All other systems are reviewed and negative.   Physical Exam: VS:  BP 114/70   Pulse 75   Temp (!) 97.3 F (36.3 C)   Ht 6\' 4"  (1.93 m)   Wt 237 lb 3.2 oz (107.6 kg)   SpO2 99%   BMI 28.87 kg/m , BMI Body mass index is 28.87 kg/m.  Wt Readings from Last 3 Encounters:  09/24/19 237 lb 3.2 oz (107.6 kg)  08/24/19 232 lb 12.8 oz (105.6 kg)  06/18/19 237 lb (107.5 kg)    General: Tall male, appears comfortable at rest. HEENT: Conjunctiva and lids normal, wearing a mask. Neck: Supple, no elevated JVP or carotid bruits, no thyromegaly. Lungs: Clear to auscultation, nonlabored breathing at rest. Cardiac: Irregularly irregular, no S3, soft systolic murmur, no pericardial rub. Abdomen: Soft, nontender, bowel sounds present. Extremities: No pitting edema, distal pulses 2+. Skin: Warm and dry. Musculoskeletal: No kyphosis. Neuropsychiatric: Alert and oriented x3, affect grossly appropriate.  ECG:  An ECG dated 08/24/2019 was personally reviewed today and demonstrated:  Atrial fibrillation with low voltage in the limb leads, poor R wave progression.  Recent Labwork:  October 2020: BUN 17, creatinine 0.98, potassium 4.4  Other Studies Reviewed Today:  Echocardiogram 07/19/2019: 1. The left ventricle has moderate-severely reduced systolic function, with an ejection fraction of 30-35%. The cavity size was normal. Left ventricular diastolic Doppler parameters are indeterminate. 2. Diffuse hypokinesis. The anteroseptal and anterior wall are akineitic. 3. The right ventricle has mildly reduced systolic function. The cavity was moderately enlarged. There is no increase in right ventricular wall thickness. 4. Left atrial size was severely dilated. 5. Right atrial size was severely dilated. 6. Small pericardial effusion. 7. The pericardial effusion is circumferential. 8. The mitral valve is abnormal. Mild thickening of the mitral valve  leaflet. Mild calcification of the mitral valve leaflet. There is mild mitral annular calcification present. No evidence of mitral valve stenosis. 9. The tricuspid valve is abnormal. Tricuspid valve regurgitation is severe. 10. The aortic valve is tricuspid. Mild thickening of the aortic valve. Mild calcification of the aortic valve. No stenosis of the aortic valve. 11. The aorta is normal unless otherwise noted. 12. The aortic root is normal in size and structure. 13. Pulmonary hypertension is severaly elevated, PASP is 76 mmHg. 14. The inferior vena cava was dilated in size with <50% respiratory variability.  Assessment and Plan:  1.  Nonischemic cardiomyopathy, LVEF 30 to 35% by echocardiogram in September.  Also has mild RV dysfunction.  We have made significant medication changes, he is currently on Coreg, Lanoxin, Entresto, and Lasix with potassium supplements.  Renal function and potassium stable with these changes.  Adding Aldactone 12.5 mg daily with follow-up BMET.  We will plan to see him back with a repeat echocardiogram in the next few months.  2.  Permanent atrial fibrillation.  Heart rate control now with Coreg and Lanoxin.  Cardizem CD was discontinued in light of cardiomyopathy.  He is on Coumadin with follow-up by PCP, consider switching to DOAC, I asked him to discuss with his PCP.  3.  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  No device shocks or syncope.  He continues to follow with Dr. Rayann Heman.  Medication Adjustments/Labs and Tests Ordered: Current medicines are reviewed at length with the patient today.  Concerns regarding medicines are outlined above.   Tests Ordered: Orders Placed This Encounter  Procedures  . Basic metabolic panel  . ECHOCARDIOGRAM COMPLETE    Medication Changes: Meds ordered this encounter  Medications  . spironolactone (ALDACTONE) 25 MG tablet    Sig: Take 0.5 tablets (12.5 mg total) by mouth daily.    Dispense:  45 tablet    Refill:  1    09/24/2019 NEW     Disposition:  Follow up 2 months in the [place] office.  Signed, Satira Sark, MD, [facility] 09/24/2019 8:43 AM    [facility] at [place], [place], [place] [address] Phone: [phone]; Fax: [phone]

## 2019-09-24 ENCOUNTER — Ambulatory Visit: Payer: Medicare HMO | Admitting: Cardiology

## 2019-09-24 ENCOUNTER — Encounter: Payer: Self-pay | Admitting: Cardiology

## 2019-09-24 ENCOUNTER — Other Ambulatory Visit: Payer: Self-pay

## 2019-09-24 VITALS — BP 114/70 | HR 75 | Temp 97.3°F | Ht 76.0 in | Wt 237.2 lb

## 2019-09-24 DIAGNOSIS — I5022 Chronic systolic (congestive) heart failure: Secondary | ICD-10-CM | POA: Diagnosis not present

## 2019-09-24 DIAGNOSIS — I4821 Permanent atrial fibrillation: Secondary | ICD-10-CM | POA: Diagnosis not present

## 2019-09-24 DIAGNOSIS — I428 Other cardiomyopathies: Secondary | ICD-10-CM | POA: Diagnosis not present

## 2019-09-24 DIAGNOSIS — Z95810 Presence of automatic (implantable) cardiac defibrillator: Secondary | ICD-10-CM | POA: Diagnosis not present

## 2019-09-24 MED ORDER — SPIRONOLACTONE 25 MG PO TABS: 12.5000 mg | ORAL_TABLET | Freq: Every day | ORAL | 1 refills | Status: DC

## 2019-09-24 NOTE — Patient Instructions (Addendum)
Medication Instructions:    Your physician has recommended you make the following change in your medication:   Start spironolactone 12.5 mg by mouth daily  Continue all other medications the same  Labwork:  Your physician recommends that you return for non-fasting lab work in: 10 days to check your BMET. You may have this done at [facility] any day Monday-Friday from 8:00 am-4:00 pm. No appointment is necessary.  Testing/Procedures: Your physician has requested that you have an echocardiogram. Echocardiography is a painless test that uses sound waves to create images of your heart. It provides your doctor with information about the size and shape of your heart and how well your heart's chambers and valves are working. This procedure takes approximately one hour. There are no restrictions for this procedure.  Follow-Up:  Your physician recommends that you schedule a follow-up appointment in: 2 months.  Any Other Special Instructions Will Be Listed Below (If Applicable).  If you need a refill on your cardiac medications before your next appointment, please call your pharmacy.

## 2019-10-04 ENCOUNTER — Telehealth: Payer: Self-pay | Admitting: *Deleted

## 2019-10-04 NOTE — Telephone Encounter (Signed)
Patient informed. Copy sent to PCP °

## 2019-10-04 NOTE — Telephone Encounter (Signed)
-----   Message from Satira Sark, MD sent at 10/03/2019 10:43 AM EST ----- Results reviewed.  Renal function and potassium stable, continue with current regimen.

## 2019-11-15 ENCOUNTER — Ambulatory Visit (INDEPENDENT_AMBULATORY_CARE_PROVIDER_SITE_OTHER): Payer: Medicare HMO | Admitting: *Deleted

## 2019-11-15 DIAGNOSIS — I428 Other cardiomyopathies: Secondary | ICD-10-CM | POA: Diagnosis not present

## 2019-11-15 LAB — CUP PACEART REMOTE DEVICE CHECK
Battery Voltage: 2.91 V
Brady Statistic RV Percent Paced: 2.95 %
Date Time Interrogation Session: 20201231043624
HighPow Impedance: 285 Ohm
HighPow Impedance: 53 Ohm
Implantable Lead Implant Date: 20130529
Implantable Lead Location: 753860
Implantable Lead Model: 6935
Implantable Pulse Generator Implant Date: 20130529
Lead Channel Impedance Value: 399 Ohm
Lead Channel Pacing Threshold Amplitude: 1 V
Lead Channel Pacing Threshold Pulse Width: 0.4 ms
Lead Channel Sensing Intrinsic Amplitude: 2 mV
Lead Channel Sensing Intrinsic Amplitude: 2 mV
Lead Channel Setting Pacing Amplitude: 2.5 V
Lead Channel Setting Pacing Pulse Width: 0.4 ms
Lead Channel Setting Sensing Sensitivity: 0.3 mV

## 2019-11-23 ENCOUNTER — Ambulatory Visit: Payer: Medicare HMO | Admitting: Urology

## 2019-11-29 ENCOUNTER — Ambulatory Visit (INDEPENDENT_AMBULATORY_CARE_PROVIDER_SITE_OTHER): Payer: Medicare HMO

## 2019-11-29 ENCOUNTER — Other Ambulatory Visit: Payer: Self-pay

## 2019-11-29 DIAGNOSIS — I428 Other cardiomyopathies: Secondary | ICD-10-CM

## 2019-11-30 ENCOUNTER — Telehealth: Payer: Self-pay | Admitting: *Deleted

## 2019-11-30 NOTE — Telephone Encounter (Signed)
-----   Message from Satira Sark, MD sent at 11/29/2019 11:21 AM EST ----- Results reviewed.  Relatively stable findings in terms of LVEF and pulmonary hypertension.  Keep scheduled follow-up visit for further discussion.

## 2019-11-30 NOTE — Telephone Encounter (Signed)
Patient informed. Copy sent to PCP °

## 2019-12-04 ENCOUNTER — Ambulatory Visit: Payer: Medicare HMO | Admitting: Cardiology

## 2019-12-04 ENCOUNTER — Other Ambulatory Visit: Payer: Self-pay | Admitting: Cardiology

## 2019-12-06 NOTE — Progress Notes (Signed)
Cardiology Office Note  Date: 12/07/2019   ID: [name] [name], DOB 06-24-1956, MRN [id number]  PCP:  Glenda Chroman, MD  Cardiologist:  Rozann Lesches, MD Electrophysiologist:  None   Chief Complaint  Patient presents with  . Cardiac follow-up    History of Present Illness: [name] [name] is a 64 y.o. male last seen in November 2020. He presents for a routine visit. States that he has been less active, staying at his house a lot trying to avoid COVID-19. He reports NYHA class II dyspnea with low-level activity, no palpitations or chest pain.  He is on Coumadin with follow-up by Dr. Woody Seller. I have asked him to discuss switching to a DOAC.  He follows with Dr. Rayann Heman in the device clinic, Medtronic ICD in place. He does not report any device shocks or syncope.  Recent follow-up echocardiogram showed LVEF 30 to 35% with global hypokinesis more prominent in the anteroseptal wall, mild RV dysfunction, mild mitral regurgitation, and severe tricuspid regurgitation with severe pulmonary hypertension as evidenced by RVSP 75 mmHg.  Device wire present.  Also circumferential pericardial effusion that is moderate posteriorly.  We went over his medications today and will plan to increase Coreg dosing. I talked with him about referral to the heart failure clinic for consideration of a right heart catheterization, also sleep study evaluation. He states that he does not have an easy way to get to [facility] and would like to hold off on this. We can make a referral locally for a sleep evaluation.  Past Medical History:  Diagnosis Date  . Anemia   . Atrial fibrillation ([place])   . AVM (arteriovenous malformation)    Duodenum - nonbleeding and EGD 11/13  . Cardiac arrest ([place]) 1998   ICD implanted at [facility]  . Cardiomyopathy, nonischemic (HCC)    LVEF 35-40%  . Chronic systolic heart failure ([place])   . Essential hypertension   . History of colonic polyps    Colonoscopy 11/13  . Pneumonia     Severe with respiratory failure 10/13    Past Surgical History:  Procedure Laterality Date  . [place]  . COLONOSCOPY  09/26/2012   Procedure: COLONOSCOPY;  Surgeon: Beryle Beams, MD;  Location: [place];  Service: Endoscopy;  Laterality: N/A;  . Defibrillator system revision  04/12/12   MDT Protecta XT VR implanted at [facility] by Dr Deno Etienne with previously implanted system and leads extracted due to RV lead failure  . ESOPHAGOGASTRODUODENOSCOPY  09/25/2012   Procedure: ESOPHAGOGASTRODUODENOSCOPY (EGD);  Surgeon: Beryle Beams, MD;  Location: [facility] ENDOSCOPY;  Service: Endoscopy;  Laterality: N/A;    Current Outpatient Medications  Medication Sig Dispense Refill  . carvedilol (COREG) 25 MG tablet Take 2 tablets (50 mg total) by mouth 2 (two) times daily with a meal. 360 tablet 2  . Cholecalciferol (VITAMIN D3) 2000 UNITS capsule Take 2,000 Units by mouth daily.      . digoxin (LANOXIN) 0.125 MG tablet TAKE 1 TABLET BY MOUTH DAILY 90 tablet 1  . finasteride (PROSCAR) 5 MG tablet Take 5 mg by mouth daily.     . furosemide (LASIX) 40 MG tablet Take 1 tablet (40 mg total) by mouth daily. 90 tablet 1  . polyethylene glycol powder (GLYCOLAX/MIRALAX) powder Take 17 g by mouth daily as needed. For constipation - mix with 8 oz liquid and drink    . potassium chloride (MICRO-K) 10 MEQ CR capsule Take 1 capsule (10 mEq total)  by mouth daily. 90 capsule 3  . pravastatin (PRAVACHOL) 40 MG tablet Take 40 mg by mouth at bedtime.     . sacubitril-valsartan (ENTRESTO) 24-26 MG Take 1 tablet by mouth 2 (two) times daily. 60 tablet 6  . spironolactone (ALDACTONE) 25 MG tablet Take 0.5 tablets (12.5 mg total) by mouth daily. 45 tablet 1  . warfarin (COUMADIN) 1 MG tablet Take 1 mg by mouth daily. MANAGED BY PMD    . warfarin (COUMADIN) 5 MG tablet Take 5 mg by mouth daily. Taking with 3mg  for a total of 7mg      No current facility-administered medications for this visit.    Allergies:  Patient has no known allergies.   Social History: The patient  reports that he quit smoking about 23 years ago. His smoking use included cigarettes. He started smoking about 41 years ago. He has a 4.80 pack-year smoking history. He has never used smokeless tobacco. He reports current drug use. Drug: Marijuana. He reports that he does not drink alcohol.   ROS:  Please see the history of present illness. Otherwise, complete review of systems is positive for none.  All other systems are reviewed and negative.   Physical Exam: VS:  BP 106/71   Pulse 78   Ht 6\' 5"  (1.956 m)   Wt 236 lb (107 kg)   SpO2 99%   BMI 27.99 kg/m , BMI Body mass index is 27.99 kg/m.  Wt Readings from Last 3 Encounters:  12/07/19 236 lb (107 kg)  09/24/19 237 lb 3.2 oz (107.6 kg)  08/24/19 232 lb 12.8 oz (105.6 kg)    General: Tall statured male, appears comfortable at rest. HEENT: Conjunctiva and lids normal, wearing a mask. Neck: Supple, no elevated JVP or carotid bruits, no thyromegaly. Lungs: Clear to auscultation, nonlabored breathing at rest. Cardiac: Irregularly irregular, no S3, soft systolic murmur, no pericardial rub. Abdomen: Soft, bowel sounds present, no guarding or rebound. Extremities: No pitting edema, distal pulses 2+. Skin: Warm and dry. Musculoskeletal: No kyphosis. Neuropsychiatric: Alert and oriented x3, affect grossly appropriate.  ECG:  An ECG dated 08/24/2019 was personally reviewed today and demonstrated:  Atrial fibrillation with low voltage in the limb leads, poor R wave progression.  Recent Labwork:  November 2020: BUN 23, creatinine 1.06, potassium 4.5  Other Studies Reviewed Today:  Echocardiogram 11/29/2019:  1. Left ventricular ejection fraction, by visual estimation, is 30 to 35%. The left ventricle has moderate to severely decreased function. There is mildly increased left ventricular hypertrophy.  2. Left ventricular diastolic parameters are  indeterminate.  3. Mildly dilated left ventricular internal cavity size.  4. Right ventricular volume and pressure overload.  5. The left ventricle demonstrates global hypokinesis with more prominent anteroseptal hypokinesis.  6. Global right ventricle has mildly reduced systolic function.The right ventricular size is moderately enlarged. No increase in right ventricular wall thickness.  7. Left atrial size was mildly dilated.  8. Right atrial size was severely dilated.  9. Small pericardial effusion. 10. The pericardial effusion is circumferential with moderate collection posteriorly. 11. The mitral valve is grossly normal. Mild mitral valve regurgitation. 12. The tricuspid valve is grossly normal. There is severe tricuspid regurgitation. 13. The aortic valve is tricuspid. Aortic valve regurgitation is not visualized. 14. The pulmonic valve was grossly normal. Pulmonic valve regurgitation is trivial. 15. Severely elevated pulmonary artery systolic pressure. 16. A pacer wire is visualized. 17. The inferior vena cava is dilated in size with <50% respiratory variability, suggesting right atrial  pressure of 15 mmHg. 18. The tricuspid regurgitant velocity is 3.88 m/s, and with an assumed right atrial pressure of 15 mmHg, the estimated right ventricular systolic pressure is severely elevated at 75.2 mmHg.  Assessment and Plan:  1. Nonischemic cardiomyopathy with chronic systolic heart failure. LVEF stable in the range of 30 to 35%. He also has mild RV dysfunction and severe tricuspid regurgitation with severe pulmonary hypertension. Plan to continue current diuretic dosing on Lasix with potassium supplements. Continue Aldactone, Lanoxin, Entresto, increase Coreg to 50 mg twice daily. I did talk with him about referral to the heart failure clinic (and consideration of a right heart catheterization), but he states that he has concerns about transportation to [place]. In the meanwhile we will get him  set up for a sleep study in [place].  2. Permanent atrial fibrillation. Increasing Coreg for better heart rate control, continue Lanoxin. He is on Coumadin with follow-up by Dr. Woody Seller, I have asked him to discuss switching to a DOAC.  3. 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 No device shocks or syncope. He follows with Dr. Rayann Heman.  Medication Adjustments/Labs and Tests Ordered: Current medicines are reviewed at length with the patient today.  Concerns regarding medicines are outlined above.   Tests Ordered: Orders Placed This Encounter  Procedures  . Basic metabolic panel  . Ambulatory referral to Neurology    Medication Changes: Meds ordered this encounter  Medications  . DISCONTD: carvedilol (COREG) 25 MG tablet    Sig: Take 2 tablets (50 mg total) by mouth 2 (two) times daily with a meal.    Dispense:  360 tablet    Refill:  2    DOSE INCREASE 12/07/2019  . carvedilol (COREG) 25 MG tablet    Sig: Take 2 tablets (50 mg total) by mouth 2 (two) times daily with a meal.    Dispense:  360 tablet    Refill:  2    DOSE INCREASE 12/07/2019    Disposition:  Follow up 3 months in the [place] office.  Signed, Satira Sark, MD, [facility] 12/07/2019 9:02 AM    [place] at [place], [place], [place] [address] Phone: [phone]; Fax: [phone]

## 2019-12-07 ENCOUNTER — Other Ambulatory Visit: Payer: Self-pay

## 2019-12-07 ENCOUNTER — Ambulatory Visit: Payer: Medicare HMO | Admitting: Cardiology

## 2019-12-07 ENCOUNTER — Encounter: Payer: Self-pay | Admitting: Cardiology

## 2019-12-07 VITALS — BP 106/71 | HR 78 | Ht 77.0 in | Wt 236.0 lb

## 2019-12-07 DIAGNOSIS — Z95810 Presence of automatic (implantable) cardiac defibrillator: Secondary | ICD-10-CM | POA: Diagnosis not present

## 2019-12-07 DIAGNOSIS — I428 Other cardiomyopathies: Secondary | ICD-10-CM

## 2019-12-07 DIAGNOSIS — I272 Pulmonary hypertension, unspecified: Secondary | ICD-10-CM

## 2019-12-07 DIAGNOSIS — R400 Somnolence: Secondary | ICD-10-CM

## 2019-12-07 DIAGNOSIS — I5022 Chronic systolic (congestive) heart failure: Secondary | ICD-10-CM

## 2019-12-07 DIAGNOSIS — I4821 Permanent atrial fibrillation: Secondary | ICD-10-CM

## 2019-12-07 DIAGNOSIS — Z9189 Other specified personal risk factors, not elsewhere classified: Secondary | ICD-10-CM

## 2019-12-07 MED ORDER — CARVEDILOL 25 MG PO TABS: 50.0000 mg | ORAL_TABLET | Freq: Two times a day (BID) | ORAL | 2 refills | Status: DC

## 2019-12-07 NOTE — Patient Instructions (Addendum)
Medication Instructions:    Your physician has recommended you make the following change in your medication:   Increase carvedilol to 50 mg by mouth twice. Please take (2) of your 25 mg tablets twice daily.  Continue other medications the same  Labwork:  Your physician recommends that you return for non-fasting lab work in: 3 months to check your BMET.   Testing/Procedures:  NONE  Follow-Up:  Your physician recommends that you schedule a follow-up appointment in: 3 months (office)  You have been referred to Dr. Merlene Laughter to be evaluated for sleep apnea  Any Other Special Instructions Will Be Listed Below (If Applicable).  If you need a refill on your cardiac medications before your next appointment, please call your pharmacy.

## 2020-01-10 NOTE — Progress Notes (Signed)
Subjective:  1. Elevated PSA   2. BPH with urinary obstruction   3. Urgency of urination   4. Nocturia      My PSA is elevated above the normal range.  [name] is a former patient of Dr. Exie Parody with a history of and elevated PSA and BPH with BOO. He is off of  tamsulosin but remains on finasteride. His PSA was 7.3 last year and is 7.2 prior to this visit.  It was  up to 8 in 2019  6.4.  He has had 3 negative biopsies in 2012, 2014 and 2016. He had cystoscopy 4-5 years ago with BPH and BOO with a large prostate. He is voiding well but is on furosemide which contributes to urgency and nocturia.   He has a good stream and empties well. He has no hematuria or dysuria. His IPSS is 10 today.   He is on warfarin but is having trouble maintaining a therapeutic level.   IPSS    Row Name 01/11/20 0900         International Prostate Symptom Score   How often have you had the sensation of not emptying your bladder?  Not at All     How often have you had to urinate less than every two hours?  Not at All     How often have you found you stopped and started again several times when you urinated?  Not at All     How often have you found it difficult to postpone urination?  Almost always     How often have you had a weak urinary stream?  Less than half the time     How often have you had to strain to start urination?  Not at All     How many times did you typically get up at night to urinate?  3 Times     Total IPSS Score  10         ROS:  ROS:  A complete review of systems was performed.  All systems are negative except for pertinent findings as noted.   ROS  No Known Allergies  Outpatient Encounter Medications as of 01/11/2020  Medication Sig  . carvedilol (COREG) 25 MG tablet Take 2 tablets (50 mg total) by mouth 2 (two) times daily with a meal.  . Cholecalciferol (VITAMIN D3) 2000 UNITS capsule Take 2,000 Units by mouth daily.    . digoxin (LANOXIN) 0.125 MG tablet TAKE 1 TABLET BY  MOUTH DAILY  . finasteride (PROSCAR) 5 MG tablet Take 1 tablet (5 mg total) by mouth daily.  . furosemide (LASIX) 40 MG tablet Take 1 tablet (40 mg total) by mouth daily.  . polyethylene glycol powder (GLYCOLAX/MIRALAX) powder Take 17 g by mouth daily as needed. For constipation - mix with 8 oz liquid and drink  . potassium chloride (MICRO-K) 10 MEQ CR capsule Take 1 capsule (10 mEq total) by mouth daily.  . pravastatin (PRAVACHOL) 40 MG tablet Take 40 mg by mouth at bedtime.   . sacubitril-valsartan (ENTRESTO) 24-26 MG Take 1 tablet by mouth 2 (two) times daily.  Marland Kitchen warfarin (COUMADIN) 1 MG tablet Take 1 mg by mouth daily. MANAGED BY PMD  . warfarin (COUMADIN) 5 MG tablet Take 5 mg by mouth daily. Taking with 3mg  for a total of 7mg   . [DISCONTINUED] finasteride (PROSCAR) 5 MG tablet Take 5 mg by mouth daily.   Marland Kitchen spironolactone (ALDACTONE) 25 MG tablet Take 0.5 tablets (12.5 mg total) by  mouth daily.   No facility-administered encounter medications on file as of 01/11/2020.    Past Medical History:  Diagnosis Date  . Anemia   . Atrial fibrillation ([place])   . AVM (arteriovenous malformation)    Duodenum - nonbleeding and EGD 11/13  . Cardiac arrest ([place]) 1998   ICD implanted at [facility]  . Cardiomyopathy, nonischemic (HCC)    LVEF 35-40%  . Chronic systolic heart failure ([place])   . Essential hypertension   . History of colonic polyps    Colonoscopy 11/13  . Pneumonia    Severe with respiratory failure 10/13    Past Surgical History:  Procedure Laterality Date  . [place]  . COLONOSCOPY  09/26/2012   Procedure: COLONOSCOPY;  Surgeon: Beryle Beams, MD;  Location: [place];  Service: Endoscopy;  Laterality: N/A;  . Defibrillator system revision  04/12/12   MDT Protecta XT VR implanted at [facility] by Dr Deno Etienne with previously implanted system and leads extracted due to RV lead failure  . ESOPHAGOGASTRODUODENOSCOPY  09/25/2012   Procedure:  ESOPHAGOGASTRODUODENOSCOPY (EGD);  Surgeon: Beryle Beams, MD;  Location: [facility] ENDOSCOPY;  Service: Endoscopy;  Laterality: N/A;    Social History   Socioeconomic History  . Marital status: Single    Spouse name: Not on file  . Number of children: Not on file  . Years of education: Not on file  . Highest education level: Not on file  Occupational History  . Occupation: DIABLED  Tobacco Use  . Smoking status: Former Smoker    Packs/day: 0.80    Years: 6.00    Pack years: 4.80    Types: Cigarettes    Start date: 05/13/1978    Quit date: 11/15/1996    Years since quitting: 23.1  . Smokeless tobacco: Never Used  Substance and Sexual Activity  . Alcohol use: No    Alcohol/week: 0.0 standard drinks  . Drug use: Yes    Types: Marijuana    Comment: Many years ago  . Sexual activity: Never  Other Topics Concern  . Not on file  Social History Narrative   Lives in [place] with his father and 2 brothers.   Social Determinants of Health   Financial Resource Strain:   . Difficulty of Paying Living Expenses: Not on file  Food Insecurity:   . Worried About Charity fundraiser in the Last Year: Not on file  . Ran Out of Food in the Last Year: Not on file  Transportation Needs:   . Lack of Transportation (Medical): Not on file  . Lack of Transportation (Non-Medical): Not on file  Physical Activity:   . Days of Exercise per Week: Not on file  . Minutes of Exercise per Session: Not on file  Stress:   . Feeling of Stress : Not on file  Social Connections:   . Frequency of Communication with Friends and Family: Not on file  . Frequency of Social Gatherings with Friends and Family: Not on file  . Attends Religious Services: Not on file  . Active Member of Clubs or Organizations: Not on file  . Attends Archivist Meetings: Not on file  . Marital Status: Not on file  Intimate Partner Violence:   . Fear of Current or Ex-Partner: Not on file  . Emotionally Abused: Not on  file  . Physically Abused: Not on file  . Sexually Abused: Not on file    Family History  Problem Relation Age of Onset  .  Diabetes Mother        Died @ 40.  Marland Kitchen Hypertension Father        Alive @ 30.  . Alzheimer's disease Father   . Diabetes Brother        Brother also has htn  . Hypertension Brother        Objective: Vitals:   01/11/20 0920  BP: 95/63  Pulse: 68  Temp: (!) 96.8 F (36 C)     Physical Exam  Lab Results:  No results found for this or any previous visit (from the past 24 hour(s)).  BMET No results for input(s): NA, K, CL, CO2, GLUCOSE, BUN, CREATININE, CALCIUM in the last 72 hours. PSA 7.2   Studies/Results: No results found.    Assessment & Plan: BPH with BOO.  He has some increased urgency and nocturia with furosemide but is otherwise doing well on finasteride alone.  Elevated PSA.  His PSA is stable over the last year.   Repeat in 1 year with an exam.    Meds ordered this encounter  Medications  . finasteride (PROSCAR) 5 MG tablet    Sig: Take 1 tablet (5 mg total) by mouth daily.    Dispense:  90 tablet    Refill:  3     Orders Placed This Encounter  Procedures  . PSA, total and free    Standing Status:   Future    Standing Expiration Date:   01/10/2021      Return in about 1 year (around 01/10/2021) for for BPH and an elevated PSA. Marland Kitchen   CC: Glenda Chroman, MD      Irine Seal 01/11/2020

## 2020-01-11 ENCOUNTER — Ambulatory Visit (INDEPENDENT_AMBULATORY_CARE_PROVIDER_SITE_OTHER): Payer: Medicare HMO | Admitting: Urology

## 2020-01-11 ENCOUNTER — Encounter: Payer: Self-pay | Admitting: Urology

## 2020-01-11 ENCOUNTER — Other Ambulatory Visit: Payer: Self-pay

## 2020-01-11 VITALS — BP 95/63 | HR 68 | Temp 96.8°F | Ht 76.0 in | Wt 235.0 lb

## 2020-01-11 DIAGNOSIS — R972 Elevated prostate specific antigen [PSA]: Secondary | ICD-10-CM

## 2020-01-11 DIAGNOSIS — R351 Nocturia: Secondary | ICD-10-CM

## 2020-01-11 DIAGNOSIS — R3915 Urgency of urination: Secondary | ICD-10-CM | POA: Diagnosis not present

## 2020-01-11 DIAGNOSIS — N401 Enlarged prostate with lower urinary tract symptoms: Secondary | ICD-10-CM | POA: Diagnosis not present

## 2020-01-11 DIAGNOSIS — N138 Other obstructive and reflux uropathy: Secondary | ICD-10-CM

## 2020-01-11 MED ORDER — FINASTERIDE 5 MG PO TABS: 5.0000 mg | ORAL_TABLET | Freq: Every day | ORAL | 3 refills | Status: DC

## 2020-01-14 ENCOUNTER — Telehealth: Payer: Self-pay | Admitting: *Deleted

## 2020-01-14 NOTE — Telephone Encounter (Signed)
-----   Message from Chanda Busing sent at 01/10/2020 11:35 AM EST ----- Regarding: RE: have you heard anything back on this referral to [facility] Yes, I have spoken with Raquel Sarna at [facility] office. She has called patient twice and left messages . He has not returned the calls. Spoke with patient. Told him to please try and answer the phone when they cal.  ----- Message ----- From: Merlene Laughter, RN Sent: 12/27/2019   7:33 AM EST To: Chanda Busing Subject: have you heard anything back on this referra#

## 2020-02-14 ENCOUNTER — Ambulatory Visit (INDEPENDENT_AMBULATORY_CARE_PROVIDER_SITE_OTHER): Payer: Medicare HMO | Admitting: *Deleted

## 2020-02-14 DIAGNOSIS — I428 Other cardiomyopathies: Secondary | ICD-10-CM

## 2020-02-14 LAB — CUP PACEART REMOTE DEVICE CHECK
Battery Voltage: 2.89 V
Brady Statistic RV Percent Paced: 4.98 %
Date Time Interrogation Session: 20210401043724
HighPow Impedance: 285 Ohm
HighPow Impedance: 52 Ohm
Implantable Lead Implant Date: 20130529
Implantable Lead Location: 753860
Implantable Lead Model: 6935
Implantable Pulse Generator Implant Date: 20130529
Lead Channel Impedance Value: 399 Ohm
Lead Channel Pacing Threshold Amplitude: 1 V
Lead Channel Pacing Threshold Pulse Width: 0.4 ms
Lead Channel Sensing Intrinsic Amplitude: 2.125 mV
Lead Channel Sensing Intrinsic Amplitude: 2.125 mV
Lead Channel Setting Pacing Amplitude: 2.5 V
Lead Channel Setting Pacing Pulse Width: 0.4 ms
Lead Channel Setting Sensing Sensitivity: 0.3 mV

## 2020-02-14 NOTE — Progress Notes (Signed)
ICD Remote  

## 2020-02-29 ENCOUNTER — Other Ambulatory Visit: Payer: Self-pay | Admitting: Cardiology

## 2020-03-04 ENCOUNTER — Telehealth: Payer: Self-pay | Admitting: *Deleted

## 2020-03-04 NOTE — Telephone Encounter (Signed)
-----   Message from Satira Sark, MD sent at 03/03/2020 10:46 AM EDT ----- Results reviewed.  Renal function and potassium are normal on current regimen.

## 2020-03-04 NOTE — Telephone Encounter (Signed)
Patient informed. Copy sent to PCP °

## 2020-03-05 ENCOUNTER — Other Ambulatory Visit: Payer: Self-pay | Admitting: Cardiology

## 2020-03-07 ENCOUNTER — Ambulatory Visit: Payer: Medicare HMO | Admitting: Cardiology

## 2020-03-07 ENCOUNTER — Encounter: Payer: Self-pay | Admitting: Cardiology

## 2020-03-07 ENCOUNTER — Other Ambulatory Visit: Payer: Self-pay

## 2020-03-07 VITALS — BP 102/64 | HR 101 | Ht 77.0 in | Wt 238.0 lb

## 2020-03-07 DIAGNOSIS — Z79899 Other long term (current) drug therapy: Secondary | ICD-10-CM

## 2020-03-07 DIAGNOSIS — I4821 Permanent atrial fibrillation: Secondary | ICD-10-CM

## 2020-03-07 DIAGNOSIS — Z95810 Presence of automatic (implantable) cardiac defibrillator: Secondary | ICD-10-CM | POA: Diagnosis not present

## 2020-03-07 DIAGNOSIS — I428 Other cardiomyopathies: Secondary | ICD-10-CM

## 2020-03-07 NOTE — Progress Notes (Signed)
Cardiology Office Note  Date: 03/07/2020   ID: [name] [name], DOB 22-Apr-1956, MRN [id number]  PCP:  Glenda Chroman, MD  Cardiologist:  Rozann Lesches, MD Electrophysiologist:  None   Chief Complaint  Patient presents with  . Cardiac follow-up    History of Present Illness: [name] [name] is a 64 y.o. male last seen in January.  He presents for a follow-up visit.  He does not report any increasing shortness of breath, no leg swelling, orthopnea or PND.  Reports NYHA class II-III dyspnea, no chest pain or syncope.  We did refer him for sleep testing, although he did not present for evaluation, continues to have transportation problems.  He sees Dr. Rayann Heman in the device clinic, Medtronic ICD in place.  He does not report any device shocks.  I reviewed his medications which are outlined below.  He is tolerating current combination of therapies, heart rate is still not optimally controlled but we are somewhat limited by his blood pressure and further up titration of therapy.  Adding Corlantor would not be an option with atrial fibrillation.  I reviewed his most recent lab work as outlined below.  I talked with him about getting the coronavirus vaccine.  Past Medical History:  Diagnosis Date  . Anemia   . Atrial fibrillation ([place])   . AVM (arteriovenous malformation)    Duodenum - nonbleeding and EGD 11/13  . Cardiac arrest ([place]) 1998   ICD implanted at [facility]  . Cardiomyopathy, nonischemic (HCC)    LVEF 35-40%  . Chronic systolic heart failure ([place])   . Essential hypertension   . History of colonic polyps    Colonoscopy 11/13  . Pneumonia    Severe with respiratory failure 10/13    Past Surgical History:  Procedure Laterality Date  . [place]  . COLONOSCOPY  09/26/2012   Procedure: COLONOSCOPY;  Surgeon: Beryle Beams, MD;  Location: [place];  Service: Endoscopy;  Laterality: N/A;  . Defibrillator system revision  04/12/12   MDT  Protecta XT VR implanted at [facility] by Dr Deno Etienne with previously implanted system and leads extracted due to RV lead failure  . ESOPHAGOGASTRODUODENOSCOPY  09/25/2012   Procedure: ESOPHAGOGASTRODUODENOSCOPY (EGD);  Surgeon: Beryle Beams, MD;  Location[facility] ENDOSCOPY;  Service: Endoscopy;  Laterality: N/A;    Current Outpatient Medications  Medication Sig Dispense Refill  . Cholecalciferol (VITAMIN D3) 2000 UNITS capsule Take 2,000 Units by mouth daily.      . digoxin (LANOXIN) 0.125 MG tablet TAKE 1 TABLET BY MOUTH DAILY 90 tablet 1  . ENTRESTO 24-26 MG TAKE 1 TABLET BY MOUTH TWICE A DAY 60 tablet 6  . finasteride (PROSCAR) 5 MG tablet Take 1 tablet (5 mg total) by mouth daily. 90 tablet 3  . furosemide (LASIX) 40 MG tablet Take 1 tablet (40 mg total) by mouth daily. 90 tablet 1  . polyethylene glycol powder (GLYCOLAX/MIRALAX) powder Take 17 g by mouth daily as needed. For constipation - mix with 8 oz liquid and drink    . potassium chloride (MICRO-K) 10 MEQ CR capsule Take 1 capsule (10 mEq total) by mouth daily. 90 capsule 3  . pravastatin (PRAVACHOL) 40 MG tablet Take 40 mg by mouth at bedtime.     Marland Kitchen spironolactone (ALDACTONE) 25 MG tablet TAKE 1/2 TABLET BY MOUTH EVERY DAY 45 tablet 1  . warfarin (COUMADIN) 1 MG tablet Take 1 mg by mouth daily. MANAGED BY PMD    .  warfarin (COUMADIN) 5 MG tablet Take 5 mg by mouth daily. Taking with 3mg  for a total of 7mg     . carvedilol (COREG) 25 MG tablet Take 2 tablets (50 mg total) by mouth 2 (two) times daily with a meal. 360 tablet 2   No current facility-administered medications for this visit.   Allergies:  Patient has no known allergies.   ROS:  Arthritic pains.  Physical Exam: VS:  BP 102/64   Pulse (!) 101   Ht 6\' 5"  (1.956 m)   Wt 238 lb (108 kg)   SpO2 98%   BMI 28.22 kg/m , BMI Body mass index is 28.22 kg/m.  Wt Readings from Last 3 Encounters:  03/07/20 238 lb (108 kg)  01/11/20 235 lb (106.6 kg)  12/07/19 236 lb (107 kg)     General: Tall male, appears comfortable at rest. HEENT: Conjunctiva and lids normal, wearing a mask. Neck: Supple, no elevated JVP or carotid bruits, no thyromegaly. Lungs: Clear to auscultation, nonlabored breathing at rest. Cardiac: Irregularly irregular, no S3, soft systolic murmur, no pericardial rub. Abdomen: Soft, nontender, bowel sounds present. Extremities: Trace ankle edema, distal pulses 2+.  ECG:  An ECG dated 08/24/2019 was personally reviewed today and demonstrated:  Atrial fibrillation with low voltage, poor R wave progression.  Recent Labwork:  April 2021: BUN 17, creatinine 1.05, potassium 4.6  Other Studies Reviewed Today:  Echocardiogram 11/29/2019: 1. Left ventricular ejection fraction, by visual estimation, is 30 to 35%. The left ventricle has moderate to severely decreased function. There is mildly increased left ventricular hypertrophy. 2. Left ventricular diastolic parameters are indeterminate. 3. Mildly dilated left ventricular internal cavity size. 4. Right ventricular volume and pressure overload. 5. The left ventricle demonstrates global hypokinesis with more prominent anteroseptal hypokinesis. 6. Global right ventricle has mildly reduced systolic function.The right ventricular size is moderately enlarged. No increase in right ventricular wall thickness. 7. Left atrial size was mildly dilated. 8. Right atrial size was severely dilated. 9. Small pericardial effusion. 10. The pericardial effusion is circumferential with moderate collection posteriorly. 11. The mitral valve is grossly normal. Mild mitral valve regurgitation. 12. The tricuspid valve is grossly normal. There is severe tricuspid regurgitation. 13. The aortic valve is tricuspid. Aortic valve regurgitation is not visualized. 14. The pulmonic valve was grossly normal. Pulmonic valve regurgitation is trivial. 15. Severely elevated pulmonary artery systolic pressure. 16. A pacer wire is  visualized. 17. The inferior vena cava is dilated in size with <50% respiratory variability, suggesting right atrial pressure of 15 mmHg. 18. The tricuspid regurgitant velocity is 3.88 m/s, and with an assumed right atrial pressure of 15 mmHg, the estimated right ventricular systolic pressure is severely elevated at 75.2 mmHg.  Assessment and Plan:  1.  Nonischemic cardiomyopathy with chronic systolic heart failure.  LVEF 30 to [id number], also complicated by RV dysfunction and pulmonary hypertension.  Weight has been stable, no worsening peripheral edema.  Recent lab work shows stable renal function and potassium.  He remains at risk for decompensation in terms of symptoms and fluid status, I have offered referral to the heart failure clinic but he does have chronic problems with transportation.  Continue Coreg, Lanoxin, Entresto, Lasix, potassium supplements, and Aldactone.  Check BMET and digoxin level for next visit.  2.  Permanent atrial fibrillation, continue Coreg and Lanoxin for heart rate control, he remains on Coumadin with follow-up by PCP.  3.  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  He denies any device shocks or syncope and continues  to follow with Dr. Rayann Heman.  Medication Adjustments/Labs and Tests Ordered: Current medicines are reviewed at length with the patient today.  Concerns regarding medicines are outlined above.   Tests Ordered: Orders Placed This Encounter  Procedures  . Basic metabolic panel  . Digoxin level    Medication Changes: No orders of the defined types were placed in this encounter.   Disposition:  Follow up 3 months in the [place] office.  Signed, Satira Sark, MD, [facility] 03/07/2020 8:58 AM    [facility] [place], [place], [place] [address] Phone: [phone]; Fax: [phone]

## 2020-03-07 NOTE — Patient Instructions (Addendum)
Medication Instructions:   Your physician recommends that you continue on your current medications as directed. Please refer to the Current Medication list given to you today.  Labwork:  Your physician recommends that you return for non-fasting lab work in: 3 months just before your next visit to check your BMET & Digoxin levels. You may have this done at [facility]). Please take your lab orders with you. You can go any day Monday-Friday from 7:30 am - 4:00 pm.  Testing/Procedures:  NONE  Follow-Up:  Your physician recommends that you schedule a follow-up appointment in: 3 months (office).  Any Other Special Instructions Will Be Listed Below (If Applicable).  If you need a refill on your cardiac medications before your next appointment, please call your pharmacy.

## 2020-05-15 ENCOUNTER — Ambulatory Visit (INDEPENDENT_AMBULATORY_CARE_PROVIDER_SITE_OTHER): Payer: Medicare HMO | Admitting: *Deleted

## 2020-05-15 DIAGNOSIS — I428 Other cardiomyopathies: Secondary | ICD-10-CM | POA: Diagnosis not present

## 2020-05-15 LAB — CUP PACEART REMOTE DEVICE CHECK
Battery Voltage: 2.81 V
Brady Statistic RV Percent Paced: 4.95 %
Date Time Interrogation Session: 20210701022605
HighPow Impedance: 304 Ohm
HighPow Impedance: 55 Ohm
Implantable Lead Implant Date: 20130529
Implantable Lead Location: 753860
Implantable Lead Model: 6935
Implantable Pulse Generator Implant Date: 20130529
Lead Channel Impedance Value: 418 Ohm
Lead Channel Pacing Threshold Amplitude: 0.875 V
Lead Channel Pacing Threshold Pulse Width: 0.4 ms
Lead Channel Sensing Intrinsic Amplitude: 2 mV
Lead Channel Sensing Intrinsic Amplitude: 2 mV
Lead Channel Setting Pacing Amplitude: 2.5 V
Lead Channel Setting Pacing Pulse Width: 0.4 ms
Lead Channel Setting Sensing Sensitivity: 0.3 mV

## 2020-05-16 NOTE — Progress Notes (Signed)
Remote ICD transmission.   

## 2020-05-28 ENCOUNTER — Other Ambulatory Visit: Payer: Self-pay | Admitting: Cardiology

## 2020-06-09 ENCOUNTER — Other Ambulatory Visit: Payer: Self-pay | Admitting: Cardiology

## 2020-06-11 ENCOUNTER — Telehealth: Payer: Self-pay | Admitting: *Deleted

## 2020-06-11 NOTE — Telephone Encounter (Signed)
Reviewed.  Potassium normal, creatinine has bumped up to 1.52, Lanoxin level is not high.  I reviewed his most recent medications.  Keep scheduled follow-up to review symptoms and weight in case any diuretics need to be down titrated.  Otherwise no changes for now.

## 2020-06-11 NOTE — Telephone Encounter (Signed)
Contains abnormal dataBasic Metabolic Panel Specimen:  Blood  Ref Range & Units Today  Sodium 135 - 145 mmol/L 140      Potassium 3.5 - 5.0 mmol/L 4.5      Chloride 98 - 107 mmol/L 107      CO2 21.0 - 32.0 mmol/L 27.5      Anion Gap 3 - 11 mmol/L 6      BUN 8 - 20 mg/dL 21High      Creatinine 0.80 - 1.30 mg/dL 1.52High      BUN/Creatinine Ratio  14      EGFR CKD-EPI Non-African American, Male mL/min/1.70m 48      EGFR CKD-EPI African American, Male mL/min/1.782m56      Glucose 70 - 179 mg/dL 92      Calcium 8.5 - 10.1 mg/dL 9.3      [facility]  Specimen Collected: 06/11/20 09:19 Last Resulted: 06/11/20 11:09  Received From: [facility] Received: 06/11/20 15:44   Digoxin level Specimen:  Blood  Ref Range & Units Today  Digoxin Level 0.8 - 2.0 ng/mL 0.8      Resulting Agency  [facility]  Specimen Collected: 06/11/20 09:19 Last Resulted: 06/11/20 14:22  Received From: [facility] Received: 06/11/20 15:44

## 2020-06-16 ENCOUNTER — Ambulatory Visit: Payer: Medicare HMO | Admitting: Cardiology

## 2020-06-16 ENCOUNTER — Encounter: Payer: Self-pay | Admitting: Cardiology

## 2020-06-16 ENCOUNTER — Other Ambulatory Visit: Payer: Self-pay

## 2020-06-16 VITALS — BP 96/60 | HR 89 | Ht 77.0 in | Wt 237.2 lb

## 2020-06-16 DIAGNOSIS — I4821 Permanent atrial fibrillation: Secondary | ICD-10-CM | POA: Diagnosis not present

## 2020-06-16 DIAGNOSIS — I5022 Chronic systolic (congestive) heart failure: Secondary | ICD-10-CM | POA: Diagnosis not present

## 2020-06-16 DIAGNOSIS — I428 Other cardiomyopathies: Secondary | ICD-10-CM

## 2020-06-16 DIAGNOSIS — Z95810 Presence of automatic (implantable) cardiac defibrillator: Secondary | ICD-10-CM | POA: Diagnosis not present

## 2020-06-16 DIAGNOSIS — Z79899 Other long term (current) drug therapy: Secondary | ICD-10-CM

## 2020-06-16 MED ORDER — APIXABAN 5 MG PO TABS: 5.0000 mg | ORAL_TABLET | Freq: Two times a day (BID) | ORAL | 6 refills | Status: DC

## 2020-06-16 NOTE — Progress Notes (Signed)
Cardiology Office Note  Date: 06/16/2020   ID: [name] [name], DOB 1956/10/26, MRN [id number]  PCP:  Glenda Chroman, MD  Cardiologist:  Rozann Lesches, MD Electrophysiologist:  None   Chief Complaint  Patient presents with  . Cardiac follow-up    History of Present Illness: [name] [name] is a 64 y.o. male last seen in April.  He presents for a routine visit.  He does not report any progressive shortness of breath or increasing weight on current regimen.  No sense of palpitations and no chest pain.  He sees Dr. Rayann Heman, Medtronic ICD in place.  Last device check in July revealed normal function.  He does not report any device shocks or syncope.  Recent lab work is noted below.  Creatinine has bumped up somewhat to 1.52, potassium normal.  Weight is up by only a pound.  I reviewed his medications which are outlined below and we discussed no major changes to his cardiomyopathy regimen.  We did discuss possibility of switching from Coumadin to Eliquis.  He has had both doses of the COVID-19 vaccine.  Past Medical History:  Diagnosis Date  . Anemia   . Atrial fibrillation ([name] [name])   . AVM (arteriovenous malformation)    Duodenum - nonbleeding and EGD 11/13  . Cardiac arrest ([name]) 1998   ICD implanted at [facility]  . Cardiomyopathy, nonischemic (HCC)    LVEF 35-40%  . Chronic systolic heart failure ([place])   . Essential hypertension   . History of colonic polyps    Colonoscopy 11/13  . Pneumonia    Severe with respiratory failure 10/13    Past Surgical History:  Procedure Laterality Date  . [place]  . COLONOSCOPY  09/26/2012   Procedure: COLONOSCOPY;  Surgeon: Beryle Beams, MD;  Location: [place];  Service: Endoscopy;  Laterality: N/A;  . Defibrillator system revision  04/12/12   MDT Protecta XT VR implanted at [facility] by Dr Deno Etienne with previously implanted system and leads extracted due to RV lead failure  . ESOPHAGOGASTRODUODENOSCOPY   09/25/2012   Procedure: ESOPHAGOGASTRODUODENOSCOPY (EGD);  Surgeon: Beryle Beams, MD;  Location: [facility] ENDOSCOPY;  Service: Endoscopy;  Laterality: N/A;    Current Outpatient Medications  Medication Sig Dispense Refill  . carvedilol (COREG) 25 MG tablet Take 2 tablets (50 mg total) by mouth 2 (two) times daily with a meal. 360 tablet 2  . Cholecalciferol (VITAMIN D3) 2000 UNITS capsule Take 2,000 Units by mouth daily.      . digoxin (LANOXIN) 0.125 MG tablet TAKE 1 TABLET BY MOUTH DAILY 90 tablet 1  . ENTRESTO 24-26 MG TAKE 1 TABLET BY MOUTH TWICE A DAY 60 tablet 6  . finasteride (PROSCAR) 5 MG tablet Take 1 tablet (5 mg total) by mouth daily. 90 tablet 3  . furosemide (LASIX) 40 MG tablet Take 1 tablet (40 mg total) by mouth daily. 90 tablet 1  . polyethylene glycol powder (GLYCOLAX/MIRALAX) powder Take 17 g by mouth daily as needed. For constipation - mix with 8 oz liquid and drink    . potassium chloride (MICRO-K) 10 MEQ CR capsule Take 1 capsule (10 mEq total) by mouth daily. 90 capsule 3  . pravastatin (PRAVACHOL) 40 MG tablet Take 40 mg by mouth at bedtime.     Marland Kitchen spironolactone (ALDACTONE) 25 MG tablet TAKE 1/2 TABLET BY MOUTH EVERY DAY 45 tablet 3  . apixaban (ELIQUIS) 5 MG TABS tablet Take 1 tablet (5 mg total) by mouth  2 (two) times daily. 60 tablet 6   No current facility-administered medications for this visit.   Allergies:  Patient has no known allergies.   ROS:   No orthopnea or PND, no leg swelling.  Physical Exam: VS:  BP (!) 96/60   Pulse 89   Ht 6\' 5"  (1.956 m)   Wt (!) 237 lb 3.2 oz (107.6 kg)   SpO2 98%   BMI 28.13 kg/m , BMI Body mass index is 28.13 kg/m.  Wt Readings from Last 3 Encounters:  06/16/20 (!) 237 lb 3.2 oz (107.6 kg)  03/07/20 238 lb (108 kg)  01/11/20 235 lb (106.6 kg)    General: Patient appears comfortable at rest. HEENT: Conjunctiva and lids normal, wearing a mask. Neck: Supple, no elevated JVP or carotid bruits, no thyromegaly. Lungs:  Clear to auscultation, nonlabored breathing at rest. Cardiac: Irregularly irregular, no S3, soft systolic murmur. Abdomen: Soft, nontender, bowel sounds present. Extremities: No pitting edema, distal pulses 2+.  ECG:  An ECG dated 08/24/2019 was personally reviewed today and demonstrated:  Atrial fibrillation with low voltage, poor R wave progression.  Recent Labwork:  July 2021: Potassium 4.5, BUN 21, creatinine 1.52, digoxin 0.8  Other Studies Reviewed Today:  Echocardiogram 11/29/2019: 1. Left ventricular ejection fraction, by visual estimation, is 30 to 35%. The left ventricle has moderate to severely decreased function. There is mildly increased left ventricular hypertrophy. 2. Left ventricular diastolic parameters are indeterminate. 3. Mildly dilated left ventricular internal cavity size. 4. Right ventricular volume and pressure overload. 5. The left ventricle demonstrates global hypokinesis with more prominent anteroseptal hypokinesis. 6. Global right ventricle has mildly reduced systolic function.The right ventricular size is moderately enlarged. No increase in right ventricular wall thickness. 7. Left atrial size was mildly dilated. 8. Right atrial size was severely dilated. 9. Small pericardial effusion. 10. The pericardial effusion is circumferential with moderate collection posteriorly. 11. The mitral valve is grossly normal. Mild mitral valve regurgitation. 12. The tricuspid valve is grossly normal. There is severe tricuspid regurgitation. 13. The aortic valve is tricuspid. Aortic valve regurgitation is not visualized. 14. The pulmonic valve was grossly normal. Pulmonic valve regurgitation is trivial. 15. Severely elevated pulmonary artery systolic pressure. 16. A pacer wire is visualized. 17. The inferior vena cava is dilated in size with <50% respiratory variability, suggesting right atrial pressure of 15 mmHg. 18. The tricuspid regurgitant velocity is 3.88 m/s,  and with an assumed right atrial pressure of 15 mmHg, the estimated right ventricular systolic pressure is severely elevated at 75.2 mmHg.  Assessment and Plan:  1.  Nonischemic cardiomyopathy with LVEF 30 to 35%, also mildly reduced RV contraction and by last assessment severe pulmonary hypertension.  2.  Chronic combined heart failure.  Patient reports stable NYHA class II dyspnea, weight is also stable.  He has no peripheral edema today.  Recent lab work reviewed with mild bump in creatinine up to 1.52, potassium normal, and Lanoxin level also normal.  Continue Coreg, Lanoxin, Entresto, Aldactone, Lasix, and potassium supplement.  Check BMET for next visit.  3.  Permanent atrial fibrillation, CHA2DS2-VASc score is 2.  We will look into switching from Coumadin to Eliquis as long is not cost prohibitive.  Check CBC and BMET for next visit if he makes the switch.  4.  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  Keep follow-up with Dr. Rayann Heman.  He does not report any device shocks or syncope.   Medication Adjustments/Labs and Tests Ordered: Current medicines are reviewed at length with  the patient today.  Concerns regarding medicines are outlined above.   Tests Ordered: Orders Placed This Encounter  Procedures  . Basic metabolic panel  . CBC    Medication Changes: Meds ordered this encounter  Medications  . apixaban (ELIQUIS) 5 MG TABS tablet    Sig: Take 1 tablet (5 mg total) by mouth 2 (two) times daily.    Dispense:  60 tablet    Refill:  6    NEW-STOP WARFARIN Has 30 day Free voucher RxBin: [id number]   JOI:[id number] RxPCN: [id number]     ID: [id number]    Disposition:  Follow up 4 months in the [place] office.  Signed, Satira Sark, MD, [facility] 06/16/2020 8:33 AM    [facility] [place], [place], [place] [address] Phone: [phone]; Fax: [phone]

## 2020-06-16 NOTE — Patient Instructions (Addendum)
Medication Instructions:   Your physician has recommended you make the following change in your medication:   Stop warfarin  Start eliquis 5 mg by mouth twice daily  Continue other medications the same  Labwork:  Your physician recommends that you return for non-fasting lab work in: 4 months to check your BMET & CBC. This may be done at [facility] from 8:00 am - 4:00 pm. No appointment is needed.  Testing/Procedures:  NONE  Follow-Up:  Your physician recommends that you schedule a follow-up appointment in: 4 months.  Any Other Special Instructions Will Be Listed Below (If Applicable).  If you need a refill on your cardiac medications before your next appointment, please call your pharmacy.

## 2020-06-16 NOTE — Telephone Encounter (Signed)
Discussed during office visit today.

## 2020-08-09 ENCOUNTER — Other Ambulatory Visit: Payer: Self-pay | Admitting: Cardiology

## 2020-08-14 LAB — CUP PACEART REMOTE DEVICE CHECK
Battery Voltage: 2.78 V
Brady Statistic RV Percent Paced: 5.01 %
Date Time Interrogation Session: 20210930001703
HighPow Impedance: 304 Ohm
HighPow Impedance: 54 Ohm
Implantable Lead Implant Date: 20130529
Implantable Lead Location: 753860
Implantable Lead Model: 6935
Implantable Pulse Generator Implant Date: 20130529
Lead Channel Impedance Value: 399 Ohm
Lead Channel Pacing Threshold Amplitude: 1 V
Lead Channel Pacing Threshold Pulse Width: 0.4 ms
Lead Channel Sensing Intrinsic Amplitude: 1.75 mV
Lead Channel Sensing Intrinsic Amplitude: 1.75 mV
Lead Channel Setting Pacing Amplitude: 2.5 V
Lead Channel Setting Pacing Pulse Width: 0.4 ms
Lead Channel Setting Sensing Sensitivity: 0.3 mV

## 2020-08-15 ENCOUNTER — Encounter: Payer: Medicare HMO | Admitting: Internal Medicine

## 2020-09-19 ENCOUNTER — Ambulatory Visit: Payer: Medicare HMO | Admitting: Internal Medicine

## 2020-09-19 ENCOUNTER — Encounter: Payer: Self-pay | Admitting: Internal Medicine

## 2020-09-19 VITALS — BP 100/58 | HR 84 | Ht 77.0 in | Wt 240.0 lb

## 2020-09-19 DIAGNOSIS — I4821 Permanent atrial fibrillation: Secondary | ICD-10-CM

## 2020-09-19 DIAGNOSIS — I5022 Chronic systolic (congestive) heart failure: Secondary | ICD-10-CM | POA: Diagnosis not present

## 2020-09-19 DIAGNOSIS — I428 Other cardiomyopathies: Secondary | ICD-10-CM

## 2020-09-19 DIAGNOSIS — D6869 Other thrombophilia: Secondary | ICD-10-CM

## 2020-09-19 LAB — CUP PACEART INCLINIC DEVICE CHECK
Battery Voltage: 2.76 V
Brady Statistic RV Percent Paced: 4.07 %
Date Time Interrogation Session: 20211105085938
HighPow Impedance: 304 Ohm
HighPow Impedance: 51 Ohm
Implantable Lead Implant Date: 20130529
Implantable Lead Location: 753860
Implantable Lead Model: 6935
Implantable Pulse Generator Implant Date: 20130529
Lead Channel Impedance Value: 399 Ohm
Lead Channel Pacing Threshold Amplitude: 1 V
Lead Channel Pacing Threshold Pulse Width: 0.4 ms
Lead Channel Sensing Intrinsic Amplitude: 2.6 mV
Lead Channel Setting Pacing Amplitude: 2.5 V
Lead Channel Setting Pacing Pulse Width: 0.4 ms
Lead Channel Setting Sensing Sensitivity: 0.3 mV

## 2020-09-19 NOTE — Progress Notes (Signed)
PCP: Glenda Chroman, MD Primary Cardiologist: Dr Domenic Polite Primary EP: Dr Rayann Heman  [name] [name] is a 64 y.o. male who presents today for routine electrophysiology followup.  Since last being seen in our clinic, the patient reports doing very well.  Today, he denies symptoms of palpitations, chest pain, shortness of breath,  lower extremity edema, dizziness, presyncope, syncope, or ICD shocks.  The patient is otherwise without complaint today.   Past Medical History:  Diagnosis Date   Anemia    Atrial fibrillation ([place])    AVM (arteriovenous malformation)    Duodenum - nonbleeding and EGD 11/13   Cardiac arrest ([place]) 1998   ICD implanted at [facility]   Cardiomyopathy, nonischemic ([place])    LVEF 97-98%   Chronic systolic heart failure ([place])    Essential hypertension    History of colonic polyps    Colonoscopy 11/13   Pneumonia    Severe with respiratory failure 10/13   Past Surgical History:  Procedure Laterality Date   [place]   COLONOSCOPY  09/26/2012   Procedure: COLONOSCOPY;  Surgeon: Beryle Beams, MD;  Location: [place];  Service: Endoscopy;  Laterality: N/A;   Defibrillator system revision  04/12/12   MDT Protecta XT VR implanted at [facility] by Dr Deno Etienne with previously implanted system and leads extracted due to RV lead failure   ESOPHAGOGASTRODUODENOSCOPY  09/25/2012   Procedure: ESOPHAGOGASTRODUODENOSCOPY (EGD);  Surgeon: Beryle Beams, MD;  [facility] ENDOSCOPY;  Service: Endoscopy;  Laterality: N/A;    ROS- all systems are reviewed and negative except as per HPI above  Current Outpatient Medications  Medication Sig Dispense Refill   apixaban (ELIQUIS) 5 MG TABS tablet Take 1 tablet (5 mg total) by mouth 2 (two) times daily. 60 tablet 6   carvedilol (COREG) 25 MG tablet TAKE 2 TABLETS (50 MG TOTAL) BY MOUTH 2 (TWO) TIMES DAILY WITH A MEAL. *DOSE INCREASE 360 tablet 1   Cholecalciferol (VITAMIN D3) 2000 UNITS capsule  Take 2,000 Units by mouth daily.       digoxin (LANOXIN) 0.125 MG tablet TAKE 1 TABLET BY MOUTH DAILY 90 tablet 1   ENTRESTO 24-26 MG TAKE 1 TABLET BY MOUTH TWICE A DAY 60 tablet 6   finasteride (PROSCAR) 5 MG tablet Take 1 tablet (5 mg total) by mouth daily. 90 tablet 3   furosemide (LASIX) 40 MG tablet Take 1 tablet (40 mg total) by mouth daily. 90 tablet 1   polyethylene glycol powder (GLYCOLAX/MIRALAX) powder Take 17 g by mouth daily as needed. For constipation - mix with 8 oz liquid and drink     potassium chloride (MICRO-K) 10 MEQ CR capsule Take 1 capsule (10 mEq total) by mouth daily. 90 capsule 3   pravastatin (PRAVACHOL) 40 MG tablet Take 40 mg by mouth at bedtime.      spironolactone (ALDACTONE) 25 MG tablet TAKE 1/2 TABLET BY MOUTH EVERY DAY 45 tablet 3   No current facility-administered medications for this visit.    Physical Exam: Vitals:   09/19/20 0841  BP: (!) 100/58  Pulse: 84  SpO2: 98%  Weight: 240 lb (108.9 kg)  Height: 6\' 5"  (1.956 m)    GEN- The patient is well appearing, alert and oriented x 3 today.   Head- normocephalic, atraumatic Eyes-  Sclera clear, conjunctiva pink Ears- hearing intact Oropharynx- clear Lungs- Clear to ausculation bilaterally, normal work of breathing Chest- ICD pocket is well healed Heart- Regular rate and rhythm, no murmurs,  rubs or gallops, PMI not laterally displaced GI- soft, NT, ND, + BS Extremities- no clubbing, cyanosis, or edema  ICD interrogation- reviewed in detail today,  See PACEART report  ekg tracing ordered today is personally reviewed and shows afib, narrow QRS  Wt Readings from Last 3 Encounters:  09/19/20 240 lb (108.9 kg)  06/16/20 (!) 237 lb 3.2 oz (107.6 kg)  03/07/20 238 lb (108 kg)    Assessment and Plan:  1.  Chronic systolic dysfunction/ nonischemic CM euvolemic today Stable on an appropriate medical regimen Normal ICD function See [facility] report No changes today he is not device  dependant today followed in ICM device clinic  2. Permanent afib Rate controlled chads2vasc score is 2  3. HTN Stable No change required today  Return to see me in a year  Risks, benefits and potential toxicities for medications prescribed and/or refilled reviewed with patient today.   Thompson Grayer MD, [facility] 09/19/2020 9:24 AM

## 2020-09-19 NOTE — Patient Instructions (Addendum)
Medication Instructions:   Your physician recommends that you continue on your current medications as directed. Please refer to the Current Medication list given to you today.  Labwork:  None  Testing/Procedures:  None  Follow-Up:  Your physician recommends that you schedule a follow-up appointment in: 1 year with Dr. Rayann Heman.   Any Other Special Instructions Will Be Listed Below (If Applicable).  If you need a refill on your cardiac medications before your next appointment, please call your pharmacy.

## 2020-09-23 ENCOUNTER — Telehealth: Payer: Self-pay

## 2020-09-23 NOTE — Telephone Encounter (Signed)
Patient returned call. He decided he wanted to keep remote monitoring to every 3 months because he does not want to get a monthly bill for ICM.  Advised he can call Dr Rayann Heman for any questions or concerns and every 3 month remote monitoring will continue.

## 2020-09-23 NOTE — Telephone Encounter (Signed)
Patient referred to [facility] clinic by Dr Rayann Heman after last office visit.  He asked patient be enrolled in [facility] clinic again after patient elected to stop monthly follow up in May 2020 due to $26 monthly charge.  Will follow up with patient if reached he he would like to enroll again.

## 2020-09-25 ENCOUNTER — Other Ambulatory Visit: Payer: Self-pay | Admitting: Cardiology

## 2020-10-21 NOTE — Progress Notes (Signed)
Cardiology Office Note  Date: 10/22/2020   ID: [name] [name], DOB 1956-05-23, MRN [id number]  PCP:  Glenda Chroman, MD  Cardiologist:  Rozann Lesches, MD Electrophysiologist:  Thompson Grayer, MD   Chief Complaint  Patient presents with  . Cardiac follow-up    History of Present Illness: [name] [name] is a 64 y.o. male last seen in August.  He presents for a routine visit.  Reports stable weights at home generally in the mid to high 230s unclothed.  He has NYHA class II dyspnea with typical activities, no palpitations or syncope.  He follows with Dr. Rayann Heman, Medtronic ICD in place.  Follow-up note reviewed from early November, no device shocks with normal device function.  Recent lab work is noted below.  I went over his medications which are outlined below, he reports compliance and no obvious intolerances.  No bleeding problems on Eliquis.  Past Medical History:  Diagnosis Date  . Anemia   . Atrial fibrillation ([place])   . AVM (arteriovenous malformation)    Duodenum - nonbleeding and EGD 11/13  . Cardiac arrest ([place]) 1998   ICD implanted at [facility]  . Cardiomyopathy, nonischemic (HCC)    LVEF 35-40%  . Chronic systolic heart failure ([place])   . Essential hypertension   . History of colonic polyps    Colonoscopy 11/13  . Pneumonia    Severe with respiratory failure 10/13    Past Surgical History:  Procedure Laterality Date  . [place]  . COLONOSCOPY  09/26/2012   Procedure: COLONOSCOPY;  Surgeon: Beryle Beams, MD;  Location: [facility];  Service: Endoscopy;  Laterality: N/A;  . Defibrillator system revision  04/12/12   MDT Protecta XT VR implanted at [facility] by Dr Deno Etienne with previously implanted system and leads extracted due to RV lead failure  . ESOPHAGOGASTRODUODENOSCOPY  09/25/2012   Procedure: ESOPHAGOGASTRODUODENOSCOPY (EGD);  Surgeon: Beryle Beams, MD;  Location: [facility] ENDOSCOPY;  Service: Endoscopy;  Laterality: N/A;     Current Outpatient Medications  Medication Sig Dispense Refill  . apixaban (ELIQUIS) 5 MG TABS tablet Take 1 tablet (5 mg total) by mouth 2 (two) times daily. 60 tablet 6  . carvedilol (COREG) 25 MG tablet TAKE 2 TABLETS (50 MG TOTAL) BY MOUTH 2 (TWO) TIMES DAILY WITH A MEAL. *DOSE INCREASE 360 tablet 1  . Cholecalciferol (VITAMIN D3) 2000 UNITS capsule Take 2,000 Units by mouth daily.      . digoxin (LANOXIN) 0.125 MG tablet TAKE 1 TABLET BY MOUTH DAILY 90 tablet 1  . ENTRESTO 24-26 MG TAKE 1 TABLET BY MOUTH TWICE A DAY 60 tablet 6  . finasteride (PROSCAR) 5 MG tablet Take 1 tablet (5 mg total) by mouth daily. 90 tablet 3  . furosemide (LASIX) 40 MG tablet Take 1 tablet (40 mg total) by mouth daily. 90 tablet 1  . polyethylene glycol powder (GLYCOLAX/MIRALAX) powder Take 17 g by mouth daily as needed. For constipation - mix with 8 oz liquid and drink    . potassium chloride (MICRO-K) 10 MEQ CR capsule Take 1 capsule (10 mEq total) by mouth daily. 90 capsule 3  . pravastatin (PRAVACHOL) 40 MG tablet Take 40 mg by mouth at bedtime.     Marland Kitchen spironolactone (ALDACTONE) 25 MG tablet TAKE 1/2 TABLET BY MOUTH EVERY DAY 45 tablet 3   No current facility-administered medications for this visit.   Allergies:  Patient has no known allergies.   ROS: No leg swelling.  Physical Exam: VS:  BP 118/82   Pulse 92   Ht 6\' 5"  (1.956 m)   Wt 246 lb 6.4 oz (111.8 kg)   SpO2 98%   BMI 29.22 kg/m , BMI Body mass index is 29.22 kg/m.  Wt Readings from Last 3 Encounters:  10/22/20 246 lb 6.4 oz (111.8 kg)  09/19/20 240 lb (108.9 kg)  06/16/20 (!) 237 lb 3.2 oz (107.6 kg)    General: Patient appears comfortable at rest. HEENT: Conjunctiva and lids normal, wearing a mask. Neck: Supple, no elevated JVP or carotid bruits, no thyromegaly. Lungs: Clear to auscultation, nonlabored breathing at rest. Cardiac: Irregularly irregular, no S3, soft systolic murmur. Extremities: No pitting edema.  ECG:  An ECG  dated 09/19/2020 was personally reviewed today and demonstrated:  Rate controlled atrial fibrillation with low voltage and nonspecific T wave changes.  Recent Labwork:  December 2021: Potassium 4.6, BUN 24, creatinine 1.34, hemoglobin 12.9, platelets 175  Other Studies Reviewed Today:  Echocardiogram 11/29/2019: 1. Left ventricular ejection fraction, by visual estimation, is 30 to 35%. The left ventricle has moderate to severely decreased function. There is mildly increased left ventricular hypertrophy. 2. Left ventricular diastolic parameters are indeterminate. 3. Mildly dilated left ventricular internal cavity size. 4. Right ventricular volume and pressure overload. 5. The left ventricle demonstrates global hypokinesis with more prominent anteroseptal hypokinesis. 6. Global right ventricle has mildly reduced systolic function.The right ventricular size is moderately enlarged. No increase in right ventricular wall thickness. 7. Left atrial size was mildly dilated. 8. Right atrial size was severely dilated. 9. Small pericardial effusion. 10. The pericardial effusion is circumferential with moderate collection posteriorly. 11. The mitral valve is grossly normal. Mild mitral valve regurgitation. 12. The tricuspid valve is grossly normal. There is severe tricuspid regurgitation. 13. The aortic valve is tricuspid. Aortic valve regurgitation is not visualized. 14. The pulmonic valve was grossly normal. Pulmonic valve regurgitation is trivial. 15. Severely elevated pulmonary artery systolic pressure. 16. A pacer wire is visualized. 17. The inferior vena cava is dilated in size with <50% respiratory variability, suggesting right atrial pressure of 15 mmHg. 18. The tricuspid regurgitant velocity is 3.88 m/s, and with an assumed right atrial pressure of 15 mmHg, the estimated right ventricular systolic pressure is severely elevated at 75.2 mmHg.  Assessment and Plan:  1.  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  He continues to do well, NYHA class II dyspnea and stable weights by his home scale.  Continue Coreg, Lanoxin, Entresto, Aldactone, and Lasix with potassium supplements.  Recent lab work is outlined above.  2.  Permanent atrial fibrillation with CHA2DS2-VASc score of 2.  He is on Eliquis for stroke prophylaxis, no reported bleeding problems.  3.  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  He continues to follow with Dr. Rayann Heman.  No device shocks or syncope.  Medication Adjustments/Labs and Tests Ordered: Current medicines are reviewed at length with the patient today.  Concerns regarding medicines are outlined above.   Tests Ordered: No orders of the defined types were placed in this encounter.   Medication Changes: No orders of the defined types were placed in this encounter.   Disposition:  Follow up 6 months in the [place] office.  Signed, Satira Sark, MD, [facility] 10/22/2020 9:49 AM    [facility] [place], [place], [place] [address] Phone: [phone]; Fax: [phone]

## 2020-10-22 ENCOUNTER — Encounter: Payer: Self-pay | Admitting: Cardiology

## 2020-10-22 ENCOUNTER — Ambulatory Visit: Payer: Medicare HMO | Admitting: Cardiology

## 2020-10-22 VITALS — BP 118/82 | HR 92 | Ht 77.0 in | Wt 246.4 lb

## 2020-10-22 DIAGNOSIS — I4821 Permanent atrial fibrillation: Secondary | ICD-10-CM

## 2020-10-22 DIAGNOSIS — I428 Other cardiomyopathies: Secondary | ICD-10-CM | POA: Diagnosis not present

## 2020-10-22 DIAGNOSIS — I5022 Chronic systolic (congestive) heart failure: Secondary | ICD-10-CM

## 2020-10-22 NOTE — Patient Instructions (Signed)
Your physician recommends that you schedule a follow-up appointment in: [place]  Your physician recommends that you continue on your current medications as directed. Please refer to the Current Medication list given to you today.  Your physician recommends that you return for lab work in [place] [facility]   Thank you for choosing [facility]!!

## 2020-11-13 ENCOUNTER — Ambulatory Visit (INDEPENDENT_AMBULATORY_CARE_PROVIDER_SITE_OTHER): Payer: Medicare HMO

## 2020-11-13 DIAGNOSIS — I5022 Chronic systolic (congestive) heart failure: Secondary | ICD-10-CM

## 2020-11-13 DIAGNOSIS — I428 Other cardiomyopathies: Secondary | ICD-10-CM | POA: Diagnosis not present

## 2020-11-13 LAB — CUP PACEART REMOTE DEVICE CHECK
Battery Voltage: 2.71 V
Brady Statistic RV Percent Paced: 4.34 %
Date Time Interrogation Session: 20211230012404
HighPow Impedance: 285 Ohm
HighPow Impedance: 45 Ohm
Implantable Lead Implant Date: 20130529
Implantable Lead Location: 753860
Implantable Lead Model: 6935
Implantable Pulse Generator Implant Date: 20130529
Lead Channel Impedance Value: 399 Ohm
Lead Channel Pacing Threshold Amplitude: 1 V
Lead Channel Pacing Threshold Pulse Width: 0.4 ms
Lead Channel Sensing Intrinsic Amplitude: 1.625 mV
Lead Channel Sensing Intrinsic Amplitude: 1.625 mV
Lead Channel Setting Pacing Amplitude: 2.5 V
Lead Channel Setting Pacing Pulse Width: 0.4 ms
Lead Channel Setting Sensing Sensitivity: 0.3 mV

## 2020-11-18 ENCOUNTER — Other Ambulatory Visit: Payer: Self-pay | Admitting: Cardiology

## 2020-11-21 ENCOUNTER — Encounter: Payer: Medicare HMO | Admitting: Internal Medicine

## 2020-11-27 NOTE — Progress Notes (Signed)
Remote ICD transmission.   

## 2020-12-24 ENCOUNTER — Other Ambulatory Visit: Payer: Self-pay | Admitting: Cardiology

## 2020-12-24 NOTE — Telephone Encounter (Signed)
Prescription refill request for Eliquis received. Indication: A fib Last office visit: 10/22/20 Scr: 10/17/20 was 1.34 Age: 65 Weight: 111.8kg  Based on above findings Eliquis 5mg  BID is appropriate.  Refills approved.

## 2021-01-10 ENCOUNTER — Other Ambulatory Visit: Payer: Self-pay | Admitting: Urology

## 2021-01-15 ENCOUNTER — Ambulatory Visit: Payer: Medicare HMO | Admitting: Urology

## 2021-01-16 ENCOUNTER — Ambulatory Visit: Payer: Medicare HMO | Admitting: Urology

## 2021-02-12 ENCOUNTER — Ambulatory Visit (INDEPENDENT_AMBULATORY_CARE_PROVIDER_SITE_OTHER): Payer: Medicare HMO

## 2021-02-12 DIAGNOSIS — I428 Other cardiomyopathies: Secondary | ICD-10-CM

## 2021-02-12 LAB — CUP PACEART REMOTE DEVICE CHECK
Battery Voltage: 2.68 V
Brady Statistic RV Percent Paced: 6.01 %
Date Time Interrogation Session: 20220331022824
HighPow Impedance: 304 Ohm
HighPow Impedance: 51 Ohm
Implantable Lead Implant Date: 20130529
Implantable Lead Location: 753860
Implantable Lead Model: 6935
Implantable Pulse Generator Implant Date: 20130529
Lead Channel Impedance Value: 361 Ohm
Lead Channel Pacing Threshold Amplitude: 1 V
Lead Channel Pacing Threshold Pulse Width: 0.4 ms
Lead Channel Sensing Intrinsic Amplitude: 2 mV
Lead Channel Sensing Intrinsic Amplitude: 2 mV
Lead Channel Setting Pacing Amplitude: 2.5 V
Lead Channel Setting Pacing Pulse Width: 0.4 ms
Lead Channel Setting Sensing Sensitivity: 0.3 mV

## 2021-02-21 ENCOUNTER — Other Ambulatory Visit: Payer: Self-pay | Admitting: Cardiology

## 2021-02-25 NOTE — Progress Notes (Signed)
Remote ICD transmission.   

## 2021-03-12 ENCOUNTER — Encounter: Payer: Self-pay | Admitting: Urology

## 2021-03-12 ENCOUNTER — Other Ambulatory Visit: Payer: Self-pay

## 2021-03-12 ENCOUNTER — Ambulatory Visit (INDEPENDENT_AMBULATORY_CARE_PROVIDER_SITE_OTHER): Payer: Medicare HMO | Admitting: Urology

## 2021-03-12 VITALS — BP 101/67 | HR 105 | Temp 98.9°F | Ht 77.0 in | Wt 238.0 lb

## 2021-03-12 DIAGNOSIS — N138 Other obstructive and reflux uropathy: Secondary | ICD-10-CM

## 2021-03-12 DIAGNOSIS — R3915 Urgency of urination: Secondary | ICD-10-CM | POA: Diagnosis not present

## 2021-03-12 DIAGNOSIS — N401 Enlarged prostate with lower urinary tract symptoms: Secondary | ICD-10-CM

## 2021-03-12 DIAGNOSIS — R351 Nocturia: Secondary | ICD-10-CM

## 2021-03-12 DIAGNOSIS — R972 Elevated prostate specific antigen [PSA]: Secondary | ICD-10-CM | POA: Diagnosis not present

## 2021-03-12 DIAGNOSIS — R3129 Other microscopic hematuria: Secondary | ICD-10-CM

## 2021-03-12 LAB — URINALYSIS, ROUTINE W REFLEX MICROSCOPIC
Bilirubin, UA: NEGATIVE
Glucose, UA: NEGATIVE
Ketones, UA: NEGATIVE
Leukocytes,UA: NEGATIVE
Nitrite, UA: NEGATIVE
Protein,UA: NEGATIVE
Specific Gravity, UA: 1.015 (ref 1.005–1.030)
Urobilinogen, Ur: 1 mg/dL (ref 0.2–1.0)
pH, UA: 6 (ref 5.0–7.5)

## 2021-03-12 LAB — MICROSCOPIC EXAMINATION
Bacteria, UA: NONE SEEN
Epithelial Cells (non renal): NONE SEEN /hpf (ref 0–10)
Renal Epithel, UA: NONE SEEN /hpf
WBC, UA: NONE SEEN /hpf (ref 0–5)

## 2021-03-12 NOTE — Progress Notes (Signed)
Urological Symptom Review  Patient is experiencing the following symptoms: None    Review of Systems  Gastrointestinal (upper)  : Negative for upper GI symptoms  Gastrointestinal (lower) : None  Constitutional : Night Sweats  Skin: Negative for skin symptoms  Eyes: Negative for eye symptoms  Ear/Nose/Throat : Negative for Ear/Nose/Throat symptoms  Hematologic/Lymphatic: Negative for Hematologic/Lymphatic symptoms  Cardiovascular : Negative for cardiovascular symptoms  Respiratory : Negative for respiratory symptoms  Endocrine: Negative for endocrine symptoms  Musculoskeletal: Negative for musculoskeletal symptoms  Neurological: Dizziness  Psychologic: Negative for psychiatric symptoms

## 2021-03-12 NOTE — Progress Notes (Signed)
Subjective:  1. BPH with urinary obstruction   2. Elevated PSA   3. Urgency of urination   4. Nocturia   5. Microhematuria      My PSA is elevated above the normal range.  4/28/2: He returns today in f/u.  He remains on finasteride. He didn't get the PSA done.  He is voiding well with an IPSS of 5 with some urgency.     GU Hx; [name] [name] is a former patient of Dr. Exie Parody with a history of and elevated PSA and BPH with BOO. He is off of  tamsulosin but remains on finasteride. His PSA was 7.3 last year and is 7.2 prior to this visit.  It was  up to 8 in 2019  6.4.  He has had 3 negative biopsies in 2012, 2014 and 2016. He had cystoscopy 4-5 years ago with BPH and BOO with a large prostate. He is voiding well but is on furosemide which contributes to urgency and nocturia.   He has a good stream and empties well. He has no hematuria or dysuria. His IPSS is 10 today.   He is on warfarin but is having trouble maintaining a therapeutic level.    IPSS    Row Name 03/12/21 0900         International Prostate Symptom Score   How often have you had the sensation of not emptying your bladder? Not at All     How often have you had to urinate less than every two hours? Not at All     How often have you found you stopped and started again several times when you urinated? Less than half the time     How often have you found it difficult to postpone urination? About half the time     How often have you had a weak urinary stream? Not at All     How often have you had to strain to start urination? Not at All     How many times did you typically get up at night to urinate? None     Total IPSS Score 5           Quality of Life due to urinary symptoms   If you were to spend the rest of your life with your urinary condition just the way it is now how would you feel about that? Mixed             ROS:  ROS:  A complete review of systems was performed.  All systems are negative except for  pertinent findings as noted.   ROS  No Known Allergies  Outpatient Encounter Medications as of 03/12/2021  Medication Sig  . carvedilol (COREG) 25 MG tablet TAKE 2 TABLETS (50 MG TOTAL) BY MOUTH 2 (TWO) TIMES DAILY WITH A MEAL. *DOSE INCREASE  . Cholecalciferol (VITAMIN D3) 2000 UNITS capsule Take 2,000 Units by mouth daily.  . digoxin (LANOXIN) 0.125 MG tablet TAKE 1 TABLET BY MOUTH DAILY  . ELIQUIS 5 MG TABS tablet TAKE 1 TABLET BY MOUTH TWICE A DAY  . ENTRESTO 24-26 MG TAKE 1 TABLET BY MOUTH TWICE A DAY  . finasteride (PROSCAR) 5 MG tablet TAKE 1 TABLET BY MOUTH EVERY DAY  . polyethylene glycol powder (GLYCOLAX/MIRALAX) powder Take 17 g by mouth daily as needed. For constipation - mix with 8 oz liquid and drink  . potassium chloride (MICRO-K) 10 MEQ CR capsule Take 1 capsule (10 mEq total) by mouth daily.  Marland Kitchen  pravastatin (PRAVACHOL) 40 MG tablet Take 40 mg by mouth at bedtime.  Marland Kitchen spironolactone (ALDACTONE) 25 MG tablet TAKE 1/2 TABLET BY MOUTH EVERY DAY  . furosemide (LASIX) 40 MG tablet Take 1 tablet (40 mg total) by mouth daily.   No facility-administered encounter medications on file as of 03/12/2021.    Past Medical History:  Diagnosis Date  . Anemia   . Atrial fibrillation ([place])   . AVM (arteriovenous malformation)    Duodenum - nonbleeding and EGD 11/13  . Cardiac arrest ([place]) 1998   ICD implanted at [facility]  . Cardiomyopathy, nonischemic (HCC)    LVEF 35-40%  . Chronic systolic heart failure ([place])   . Essential hypertension   . History of colonic polyps    Colonoscopy 11/13  . Pneumonia    Severe with respiratory failure 10/13    Past Surgical History:  Procedure Laterality Date  . [place]  . COLONOSCOPY  09/26/2012   Procedure: COLONOSCOPY;  Surgeon: Beryle Beams, MD;  Location: [facility];  Service: Endoscopy;  Laterality: N/A;  . Defibrillator system revision  04/12/12   MDT Protecta XT VR implanted at [facility] by Dr Deno Etienne with  previously implanted system and leads extracted due to RV lead failure  . ESOPHAGOGASTRODUODENOSCOPY  09/25/2012   Procedure: ESOPHAGOGASTRODUODENOSCOPY (EGD);  Surgeon: Beryle Beams, MD;  Location: [facility] ENDOSCOPY;  Service: Endoscopy;  Laterality: N/A;    Social History   Socioeconomic History  . Marital status: Single    Spouse name: Not on file  . Number of children: Not on file  . Years of education: Not on file  . Highest education level: Not on file  Occupational History  . Occupation: DIABLED  Tobacco Use  . Smoking status: Former Smoker    Packs/day: 0.80    Years: 6.00    Pack years: 4.80    Types: Cigarettes    Start date: 05/13/1978    Quit date: 11/15/1996    Years since quitting: 24.3  . Smokeless tobacco: Never Used  Substance and Sexual Activity  . Alcohol use: No    Alcohol/week: 0.0 standard drinks  . Drug use: Yes    Types: Marijuana    Comment: Many years ago  . Sexual activity: Never  Other Topics Concern  . Not on file  Social History Narrative   Lives in [place] with his father and 2 brothers.   Social Determinants of Health   Financial Resource Strain: Not on file  Food Insecurity: Not on file  Transportation Needs: Not on file  Physical Activity: Not on file  Stress: Not on file  Social Connections: Not on file  Intimate Partner Violence: Not on file    Family History  Problem Relation Age of Onset  . Diabetes Mother        Died @ 20.  Marland Kitchen Hypertension Father        Alive @ 34.  . Alzheimer's disease Father   . Diabetes Brother        Brother also has htn  . Hypertension Brother        Objective: Vitals:   03/12/21 0931  BP: 101/67  Pulse: (!) 105  Temp: 98.9 F (37.2 C)     Physical Exam Vitals reviewed.  Constitutional:      Appearance: Normal appearance.  Genitourinary:    Comments: AP without lesions. NST without mass. Prostate 3+ benign. SV non-palpable.  Neurological:     Mental Status: He is  alert.      Lab Results:  Results for orders placed or performed in visit on 03/12/21 (from the past 24 hour(s))  Urinalysis, Routine w reflex microscopic     Status: Abnormal   Collection Time: 03/12/21 10:11 AM  Result Value Ref Range   Specific Gravity, UA 1.015 1.005 - 1.030   pH, UA 6.0 5.0 - 7.5   Color, UA Amber (A) Yellow   Appearance Ur Clear Clear   Leukocytes,UA Negative Negative   Protein,UA Negative Negative/Trace   Glucose, UA Negative Negative   Ketones, UA Negative Negative   RBC, UA 2+ (A) Negative   Bilirubin, UA Negative Negative   Urobilinogen, Ur 1.0 0.2 - 1.0 mg/dL   Nitrite, UA Negative Negative   Microscopic Examination See below:    Narrative   Performed at:  [facility] [address] [place], [place]  [id number] Lab Director: Mina Marble MT, Phone:  [phone]  Microscopic Examination     Status: Abnormal   Collection Time: 03/12/21 10:11 AM   Urine  Result Value Ref Range   WBC, UA None seen 0 - 5 /hpf   RBC 3-10 (A) 0 - 2 /hpf   Epithelial Cells (non renal) None seen 0 - 10 /hpf   Renal Epithel, UA None seen None seen /hpf   Casts Present None seen /lpf   Cast Type Hyaline casts N/A   Mucus, UA Present Not Estab.   Bacteria, UA None seen None seen/Few   Narrative   Performed at:  [facility] [address] [place], [place]  [id number] Lab Director: [place], Phone:  [phone]    BMET No results for input(s): NA, K, CL, CO2, GLUCOSE, BUN, CREATININE, CALCIUM in the last 72 hours. PSA 7.2   Studies/Results: No results found.    Assessment & Plan: BPH with BOO.  He is doing well on finasteride alone.  Elevated PSA.  I will get a PSA today and in a year.  Microhematuria.  He had that several years ago but I don't see that he has been evaluated.  I will order a CT and have him return for cystoscopy.   No orders of the defined types were placed in this encounter.    Orders Placed This Encounter   Procedures  . Microscopic Examination  . Urinalysis, Routine w reflex microscopic  . PSA, total and free  . PSA, total and free    Standing Status:   Future    Standing Expiration Date:   03/12/2022      Return in about 1 year (around 03/12/2022) for also next available with CT results for possible cystoscopy. .   CC: Glenda Chroman, MD      Irine Seal 03/12/2021

## 2021-03-13 LAB — PSA, TOTAL AND FREE
PSA, Free Pct: 21 %
PSA, Free: 1.3 ng/mL
Prostate Specific Ag, Serum: 6.2 ng/mL — ABNORMAL HIGH (ref 0.0–4.0)

## 2021-03-16 ENCOUNTER — Telehealth: Payer: Self-pay

## 2021-03-16 NOTE — Telephone Encounter (Signed)
Pt made aware of PSA results and next appointment.

## 2021-03-16 NOTE — Telephone Encounter (Signed)
-----   Message from Irine Seal, MD sent at 03/14/2021  9:07 AM EDT ----- The PSA is back down some.   F/u as planned.

## 2021-04-09 ENCOUNTER — Ambulatory Visit (HOSPITAL_COMMUNITY)
Admission: RE | Admit: 2021-04-09 | Discharge: 2021-04-09 | Disposition: A | Discharge status: Home / Self Care | Payer: Medicare HMO | Source: Ambulatory Visit | Attending: Urology | Admitting: Urology

## 2021-04-09 ENCOUNTER — Encounter (HOSPITAL_COMMUNITY): Payer: Self-pay | Admitting: Radiology

## 2021-04-09 DIAGNOSIS — R3129 Other microscopic hematuria: Secondary | ICD-10-CM | POA: Insufficient documentation

## 2021-04-09 MED ORDER — IOHEXOL 300 MG/ML  SOLN
25.0000 mL | Freq: Once | INTRAMUSCULAR | Status: AC | PRN
  Administered 2021-04-09: 25 mL via INTRAVENOUS

## 2021-04-09 MED ORDER — IOHEXOL 300 MG/ML  SOLN
100.0000 mL | Freq: Once | INTRAMUSCULAR | Status: AC | PRN
  Administered 2021-04-09: 100 mL via INTRAVENOUS

## 2021-04-15 ENCOUNTER — Telehealth: Payer: Self-pay

## 2021-04-15 ENCOUNTER — Telehealth: Payer: Self-pay | Admitting: *Deleted

## 2021-04-15 DIAGNOSIS — K37 Unspecified appendicitis: Secondary | ICD-10-CM

## 2021-04-15 DIAGNOSIS — I5022 Chronic systolic (congestive) heart failure: Secondary | ICD-10-CM

## 2021-04-15 DIAGNOSIS — Z79899 Other long term (current) drug therapy: Secondary | ICD-10-CM

## 2021-04-15 DIAGNOSIS — R911 Solitary pulmonary nodule: Secondary | ICD-10-CM

## 2021-04-15 NOTE — Telephone Encounter (Signed)
-----   Message from Irine Seal, MD sent at 04/15/2021  6:44 AM EDT ----- No significant urinary findings were noted but he has pulmonary nodules and a dedicated Chest CT was recommended.  Please order that.  He also had findings that suggest appendicitis but I don't recall him having any abdominal pain, particularly in the RLQ.  If he is having pain in that area, he would probably need to go to the ER for evaluation, but if not, he needs a referral to general surgery for further evaluation.   He also has gallstones, diverticulosis and some hardening of the arteries but those are just incidental findings.  He needs f/u with me as planned.

## 2021-04-15 NOTE — Progress Notes (Signed)
No significant urinary findings were noted but he has pulmonary nodules and a dedicated Chest CT was recommended.  Please order that.  He also had findings that suggest appendicitis but I don't recall him having any abdominal pain, particularly in the RLQ.  If he is having pain in that area, he would probably need to go to the ER for evaluation, but if not, he needs a referral to general surgery for further evaluation.   He also has gallstones, diverticulosis and some hardening of the arteries but those are just incidental findings.  He needs f/u with me as planned.

## 2021-04-15 NOTE — Telephone Encounter (Signed)
I called patient and reviewed CT findings. Order placed for Chest CT- pt aware scheduling will call.   Patient denies any abdominal pain/fevers. Reviewed with patient General Surgery consult ordered but if he should develop fever or abd pain to go to nearest ER asap. Patient voiced understanding.

## 2021-04-15 NOTE — Telephone Encounter (Signed)
Informed that bmet lab order faxed to [facility] and is due prior to visit on 04/21/21. Verbalized understanding.

## 2021-04-16 LAB — POCT I-STAT CREATININE: Creatinine, Ser: 1.3 mg/dL — ABNORMAL HIGH (ref 0.61–1.24)

## 2021-04-20 NOTE — Progress Notes (Signed)
Cardiology Office Note  Date: 04/21/2021   ID: [name] [name], DOB 1956-05-04, MRN [id number]  PCP:  Glenda Chroman, MD  Cardiologist:  Rozann Lesches, MD Electrophysiologist:  Thompson Grayer, MD   Chief Complaint  Patient presents with  . Cardiac follow-up    History of Present Illness: [name] [name] is a 65 y.o. male last seen in December 2021.  He presents for a follow-up visit.  Reports NYHA class II dyspnea, no significant change in weight.  No orthopnea or progressive leg swelling.  He sees Dr. Rayann Heman, Medtronic ICD in place.  Device check in March revealed normal function.  He has had no device shocks or syncope.  I reviewed his recent follow-up lab work as noted below.  We went over his medications and discussed adjustments.  Past Medical History:  Diagnosis Date  . Anemia   . Atrial fibrillation ([place])   . AVM (arteriovenous malformation)    Duodenum - nonbleeding and EGD 11/13  . Cardiac arrest ([place]) 1998   ICD implanted at [facility]  . Cardiomyopathy, nonischemic (HCC)    LVEF 35-40%  . Chronic systolic heart failure ([place])   . Essential hypertension   . History of colonic polyps    Colonoscopy 11/13  . Pneumonia    Severe with respiratory failure 10/13    Past Surgical History:  Procedure Laterality Date  . [place]  . COLONOSCOPY  09/26/2012   Procedure: COLONOSCOPY;  Surgeon: Beryle Beams, MD;  Location: [facility];  Service: Endoscopy;  Laterality: N/A;  . Defibrillator system revision  04/12/12   MDT Protecta XT VR implanted at [facility] by Dr Deno Etienne with previously implanted system and leads extracted due to RV lead failure  . ESOPHAGOGASTRODUODENOSCOPY  09/25/2012   Procedure: ESOPHAGOGASTRODUODENOSCOPY (EGD);  Surgeon: Beryle Beams, MD;  Location: [facility] ENDOSCOPY;  Service: Endoscopy;  Laterality: N/A;    Current Outpatient Medications  Medication Sig Dispense Refill  . carvedilol (COREG) 25 MG tablet TAKE 2 TABLETS  (50 MG TOTAL) BY MOUTH 2 (TWO) TIMES DAILY WITH A MEAL. *DOSE INCREASE 90 tablet 3  . Cholecalciferol (VITAMIN D3) 2000 UNITS capsule Take 2,000 Units by mouth daily.    . dapagliflozin propanediol (FARXIGA) 10 MG TABS tablet Take 1 tablet (10 mg total) by mouth daily before breakfast. 30 tablet 6  . ELIQUIS 5 MG TABS tablet TAKE 1 TABLET BY MOUTH TWICE A DAY 60 tablet 6  . ENTRESTO 24-26 MG TAKE 1 TABLET BY MOUTH TWICE A DAY 60 tablet 6  . finasteride (PROSCAR) 5 MG tablet TAKE 1 TABLET BY MOUTH EVERY DAY 90 tablet 3  . furosemide (LASIX) 20 MG tablet Take 1 tablet (20 mg total) by mouth daily. 90 tablet 3  . polyethylene glycol powder (GLYCOLAX/MIRALAX) powder Take 17 g by mouth daily as needed. For constipation - mix with 8 oz liquid and drink    . potassium chloride (MICRO-K) 10 MEQ CR capsule Take 1 capsule (10 mEq total) by mouth daily. 90 capsule 3  . pravastatin (PRAVACHOL) 40 MG tablet Take 40 mg by mouth at bedtime.    Marland Kitchen spironolactone (ALDACTONE) 25 MG tablet TAKE 1/2 TABLET BY MOUTH EVERY DAY 45 tablet 3   No current facility-administered medications for this visit.   Allergies:  Patient has no known allergies.   ROS: No palpitations or syncope.  Physical Exam: VS:  BP 112/76   Pulse 86   Ht 6\' 4"  (1.93 m)  Wt 239 lb 9.6 oz (108.7 kg)   SpO2 97%   BMI 29.17 kg/m , BMI Body mass index is 29.17 kg/m.  Wt Readings from Last 3 Encounters:  04/21/21 239 lb 9.6 oz (108.7 kg)  03/12/21 238 lb (108 kg)  10/22/20 246 lb 6.4 oz (111.8 kg)    General: Patient appears comfortable at rest. HEENT: Conjunctiva and lids normal, wearing a mask. Neck: Supple, no elevated JVP or carotid bruits, no thyromegaly. Lungs: Clear to auscultation, nonlabored breathing at rest. Cardiac: Irregularly irregular, no S3, 1/6 systolic murmur, no pericardial rub. Extremities: No pitting edema.  ECG:  An ECG dated 09/19/2020 was personally reviewed today and demonstrated:  Rate controlled atrial  fibrillation with low voltage and nonspecific T wave changes.  Recent Labwork: 04/09/2021: Creatinine, Ser 1.14 May 2021: Potassium 3.9, BUN 20, creatinine 1.54  Other Studies Reviewed Today:  Echocardiogram 11/29/2019: 1. Left ventricular ejection fraction, by visual estimation, is 30 to 35%. The left ventricle has moderate to severely decreased function. There is mildly increased left ventricular hypertrophy. 2. Left ventricular diastolic parameters are indeterminate. 3. Mildly dilated left ventricular internal cavity size. 4. Right ventricular volume and pressure overload. 5. The left ventricle demonstrates global hypokinesis with more prominent anteroseptal hypokinesis. 6. Global right ventricle has mildly reduced systolic function.The right ventricular size is moderately enlarged. No increase in right ventricular wall thickness. 7. Left atrial size was mildly dilated. 8. Right atrial size was severely dilated. 9. Small pericardial effusion. 10. The pericardial effusion is circumferential with moderate collection posteriorly. 11. The mitral valve is grossly normal. Mild mitral valve regurgitation. 12. The tricuspid valve is grossly normal. There is severe tricuspid regurgitation. 13. The aortic valve is tricuspid. Aortic valve regurgitation is not visualized. 14. The pulmonic valve was grossly normal. Pulmonic valve regurgitation is trivial. 15. Severely elevated pulmonary artery systolic pressure. 16. A pacer wire is visualized. 17. The inferior vena cava is dilated in size with <50% respiratory variability, suggesting right atrial pressure of 15 mmHg. 18. The tricuspid regurgitant velocity is 3.88 m/s, and with an assumed right atrial pressure of 15 mmHg, the estimated right ventricular systolic pressure is severely elevated at 75.2 mmHg.  Assessment and Plan:  1.  Nonischemic cardiomyopathy with LVEF 30 to 35% and mildly reduced RV contraction with chronic combined heart  failure.  Weight and fluid status appears stable today.  Recent lab work reviewed.  Plan to stop digoxin, start Farxiga 10 mg daily, reduce Lasix to 20 mg daily with potassium supplement.  Check BMET in 2 weeks.  We will obtain a follow-up echocardiogram prior to his next visit in 3 months.  Otherwise continue Coreg, Entresto, and Aldactone.  2.  Permanent atrial fibrillation with CHA2DS2-VASc score of 3.  Continue Eliquis for stroke prophylaxis.  Heart rate control adequate on current dose of Coreg.  3.  Acquired thrombophilia, on Eliquis for stroke prophylaxis.  No reported spontaneous bleeding problems.  4.  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  No device shocks or syncope.  He continues to follow with Dr. Rayann Heman.  Medication Adjustments/Labs and Tests Ordered: Current medicines are reviewed at length with the patient today.  Concerns regarding medicines are outlined above.   Tests Ordered: Orders Placed This Encounter  Procedures  . Basic metabolic panel  . ECHOCARDIOGRAM COMPLETE    Medication Changes: Meds ordered this encounter  Medications  . furosemide (LASIX) 20 MG tablet    Sig: Take 1 tablet (20 mg total) by mouth daily.  Dispense:  90 tablet    Refill:  3    04/21/2021 dose decrease  . dapagliflozin propanediol (FARXIGA) 10 MG TABS tablet    Sig: Take 1 tablet (10 mg total) by mouth daily before breakfast.    Dispense:  30 tablet    Refill:  6    04/21/2021 NEW-stop digoxin    Disposition:  Follow up 3 months.  Signed, Satira Sark, MD, [facility] 04/21/2021 8:34 AM    [facility] [address], [place], [place] [address] Phone: [phone]; Fax: [phone]

## 2021-04-21 ENCOUNTER — Encounter: Payer: Self-pay | Admitting: Cardiology

## 2021-04-21 ENCOUNTER — Other Ambulatory Visit: Payer: Self-pay | Admitting: Cardiology

## 2021-04-21 ENCOUNTER — Ambulatory Visit: Payer: Medicare HMO | Admitting: Cardiology

## 2021-04-21 VITALS — BP 112/76 | HR 86 | Ht 76.0 in | Wt 239.6 lb

## 2021-04-21 DIAGNOSIS — I4821 Permanent atrial fibrillation: Secondary | ICD-10-CM | POA: Diagnosis not present

## 2021-04-21 DIAGNOSIS — Z95810 Presence of automatic (implantable) cardiac defibrillator: Secondary | ICD-10-CM

## 2021-04-21 DIAGNOSIS — I428 Other cardiomyopathies: Secondary | ICD-10-CM | POA: Diagnosis not present

## 2021-04-21 DIAGNOSIS — Z79899 Other long term (current) drug therapy: Secondary | ICD-10-CM

## 2021-04-21 DIAGNOSIS — D6869 Other thrombophilia: Secondary | ICD-10-CM

## 2021-04-21 DIAGNOSIS — I5042 Chronic combined systolic (congestive) and diastolic (congestive) heart failure: Secondary | ICD-10-CM | POA: Diagnosis not present

## 2021-04-21 MED ORDER — DAPAGLIFLOZIN PROPANEDIOL 10 MG PO TABS: 10.0000 mg | ORAL_TABLET | Freq: Every day | ORAL | 6 refills | Status: DC

## 2021-04-21 MED ORDER — FUROSEMIDE 20 MG PO TABS: 20.0000 mg | ORAL_TABLET | Freq: Every day | ORAL | 3 refills | Status: DC

## 2021-04-21 NOTE — Patient Instructions (Addendum)
Medication Instructions:   Your physician has recommended you make the following change in your medication:   Stop digoxin  Start farxiga 10 mg by mouth daily  Decrease furosemide to 20 mg daily (break your 40 mg tablet in half daily until finished)  Continue other medications the same  Labwork:  Your physician recommends that you return for non-fasting lab work in 2 weeks to check your BMET. This may be done at [facility].   Testing/Procedures: Your physician has requested that you have an echocardiogram in 3 months just before your next visit. Echocardiography is a painless test that uses sound waves to create images of your heart. It provides your doctor with information about the size and shape of your heart and how well your heart's chambers and valves are working. This procedure takes approximately one hour. There are no restrictions for this procedure.  Follow-Up:  Your physician recommends that you schedule a follow-up appointment in: 3 months.  Any Other Special Instructions Will Be Listed Below (If Applicable).  If you need a refill on your cardiac medications before your next appointment, please call your pharmacy.

## 2021-05-07 ENCOUNTER — Telehealth: Payer: Self-pay | Admitting: *Deleted

## 2021-05-07 NOTE — Telephone Encounter (Signed)
Patient informed. Copy sent to PCP °

## 2021-05-07 NOTE — Telephone Encounter (Signed)
-----   Message from Erma Heritage, [place] sent at 05/06/2021 10:16 AM EDT ----- Covering for Dr. Domenic Polite - Please let the patient know his electrolytes remain within a normal range. Kidney function is abnormal with creatinine at 1.51 but this is similar to prior values obtained earlier in the month (at 1.54 on 04/17/2021). Continue current medication regimen for now. Please forward a copy of results to Glenda Chroman, MD.

## 2021-05-08 ENCOUNTER — Ambulatory Visit (HOSPITAL_COMMUNITY): Payer: Medicare HMO

## 2021-05-14 ENCOUNTER — Other Ambulatory Visit: Payer: Medicare HMO | Admitting: Urology

## 2021-05-14 ENCOUNTER — Ambulatory Visit (INDEPENDENT_AMBULATORY_CARE_PROVIDER_SITE_OTHER): Payer: Medicare HMO

## 2021-05-14 DIAGNOSIS — I428 Other cardiomyopathies: Secondary | ICD-10-CM

## 2021-05-14 LAB — CUP PACEART REMOTE DEVICE CHECK
Battery Voltage: 2.65 V
Brady Statistic RV Percent Paced: 4.44 %
Date Time Interrogation Session: 20220630033427
HighPow Impedance: 304 Ohm
HighPow Impedance: 52 Ohm
Implantable Lead Implant Date: 20130529
Implantable Lead Location: 753860
Implantable Lead Model: 6935
Implantable Pulse Generator Implant Date: 20130529
Lead Channel Impedance Value: 399 Ohm
Lead Channel Pacing Threshold Amplitude: 0.875 V
Lead Channel Pacing Threshold Pulse Width: 0.4 ms
Lead Channel Sensing Intrinsic Amplitude: 1.875 mV
Lead Channel Sensing Intrinsic Amplitude: 1.875 mV
Lead Channel Setting Pacing Amplitude: 2.5 V
Lead Channel Setting Pacing Pulse Width: 0.4 ms
Lead Channel Setting Sensing Sensitivity: 0.3 mV

## 2021-05-25 ENCOUNTER — Ambulatory Visit (HOSPITAL_COMMUNITY)
Admission: RE | Admit: 2021-05-25 | Discharge: 2021-05-25 | Disposition: A | Discharge status: Home / Self Care | Payer: Medicare HMO | Source: Ambulatory Visit | Attending: Urology | Admitting: Urology

## 2021-05-25 ENCOUNTER — Other Ambulatory Visit: Payer: Self-pay

## 2021-05-25 DIAGNOSIS — I281 Aneurysm of pulmonary artery: Secondary | ICD-10-CM | POA: Diagnosis not present

## 2021-05-25 DIAGNOSIS — R911 Solitary pulmonary nodule: Secondary | ICD-10-CM

## 2021-05-25 DIAGNOSIS — I38 Endocarditis, valve unspecified: Secondary | ICD-10-CM | POA: Diagnosis not present

## 2021-05-25 MED ORDER — IOHEXOL 300 MG/ML  SOLN
75.0000 mL | Freq: Once | INTRAMUSCULAR | Status: AC | PRN
  Administered 2021-05-25: 75 mL via INTRAVENOUS

## 2021-05-26 ENCOUNTER — Ambulatory Visit: Payer: Medicare HMO | Admitting: General Surgery

## 2021-05-26 ENCOUNTER — Other Ambulatory Visit: Payer: Self-pay | Admitting: Cardiology

## 2021-05-28 ENCOUNTER — Other Ambulatory Visit: Payer: Self-pay

## 2021-05-28 ENCOUNTER — Telehealth: Payer: Self-pay

## 2021-05-28 ENCOUNTER — Inpatient Hospital Stay (HOSPITAL_COMMUNITY)
Admission: EM | Admit: 2021-05-28 | Discharge: 2021-06-04 | DRG: 286 | Disposition: A | Discharge status: Home / Self Care | Payer: Medicare HMO | Attending: Internal Medicine | Admitting: Internal Medicine

## 2021-05-28 ENCOUNTER — Encounter (HOSPITAL_COMMUNITY): Payer: Self-pay | Admitting: Emergency Medicine

## 2021-05-28 ENCOUNTER — Ambulatory Visit: Payer: Medicare HMO | Admitting: General Surgery

## 2021-05-28 ENCOUNTER — Emergency Department (HOSPITAL_COMMUNITY): Payer: Medicare HMO

## 2021-05-28 ENCOUNTER — Inpatient Hospital Stay (HOSPITAL_COMMUNITY): Payer: Medicare HMO

## 2021-05-28 ENCOUNTER — Encounter: Payer: Self-pay | Admitting: General Surgery

## 2021-05-28 VITALS — BP 82/57 | HR 68 | Temp 98.2°F | Resp 16 | Ht 76.0 in | Wt 236.0 lb

## 2021-05-28 DIAGNOSIS — I76 Septic arterial embolism: Secondary | ICD-10-CM | POA: Diagnosis present

## 2021-05-28 DIAGNOSIS — N179 Acute kidney failure, unspecified: Secondary | ICD-10-CM | POA: Diagnosis present

## 2021-05-28 DIAGNOSIS — Z8674 Personal history of sudden cardiac arrest: Secondary | ICD-10-CM

## 2021-05-28 DIAGNOSIS — I340 Nonrheumatic mitral (valve) insufficiency: Secondary | ICD-10-CM | POA: Diagnosis not present

## 2021-05-28 DIAGNOSIS — J302 Other seasonal allergic rhinitis: Secondary | ICD-10-CM | POA: Diagnosis present

## 2021-05-28 DIAGNOSIS — I2694 Multiple subsegmental pulmonary emboli without acute cor pulmonale: Secondary | ICD-10-CM | POA: Diagnosis present

## 2021-05-28 DIAGNOSIS — I330 Acute and subacute infective endocarditis: Secondary | ICD-10-CM | POA: Diagnosis not present

## 2021-05-28 DIAGNOSIS — Z820 Family history of epilepsy and other diseases of the nervous system: Secondary | ICD-10-CM

## 2021-05-28 DIAGNOSIS — I5022 Chronic systolic (congestive) heart failure: Secondary | ICD-10-CM | POA: Diagnosis present

## 2021-05-28 DIAGNOSIS — I729 Aneurysm of unspecified site: Secondary | ICD-10-CM

## 2021-05-28 DIAGNOSIS — R946 Abnormal results of thyroid function studies: Secondary | ICD-10-CM | POA: Diagnosis present

## 2021-05-28 DIAGNOSIS — Z7901 Long term (current) use of anticoagulants: Secondary | ICD-10-CM

## 2021-05-28 DIAGNOSIS — I2721 Secondary pulmonary arterial hypertension: Secondary | ICD-10-CM | POA: Diagnosis present

## 2021-05-28 DIAGNOSIS — E1122 Type 2 diabetes mellitus with diabetic chronic kidney disease: Secondary | ICD-10-CM | POA: Diagnosis present

## 2021-05-28 DIAGNOSIS — Z86711 Personal history of pulmonary embolism: Secondary | ICD-10-CM | POA: Diagnosis not present

## 2021-05-28 DIAGNOSIS — Z79899 Other long term (current) drug therapy: Secondary | ICD-10-CM

## 2021-05-28 DIAGNOSIS — Z01818 Encounter for other preprocedural examination: Secondary | ICD-10-CM

## 2021-05-28 DIAGNOSIS — I4821 Permanent atrial fibrillation: Secondary | ICD-10-CM | POA: Diagnosis present

## 2021-05-28 DIAGNOSIS — Z87891 Personal history of nicotine dependence: Secondary | ICD-10-CM | POA: Diagnosis not present

## 2021-05-28 DIAGNOSIS — I5043 Acute on chronic combined systolic (congestive) and diastolic (congestive) heart failure: Secondary | ICD-10-CM | POA: Diagnosis present

## 2021-05-28 DIAGNOSIS — Z95810 Presence of automatic (implantable) cardiac defibrillator: Secondary | ICD-10-CM | POA: Diagnosis not present

## 2021-05-28 DIAGNOSIS — R911 Solitary pulmonary nodule: Secondary | ICD-10-CM

## 2021-05-28 DIAGNOSIS — I509 Heart failure, unspecified: Secondary | ICD-10-CM | POA: Diagnosis not present

## 2021-05-28 DIAGNOSIS — I281 Aneurysm of pulmonary artery: Principal | ICD-10-CM | POA: Diagnosis present

## 2021-05-28 DIAGNOSIS — I10 Essential (primary) hypertension: Secondary | ICD-10-CM | POA: Diagnosis present

## 2021-05-28 DIAGNOSIS — Z8249 Family history of ischemic heart disease and other diseases of the circulatory system: Secondary | ICD-10-CM | POA: Diagnosis not present

## 2021-05-28 DIAGNOSIS — E785 Hyperlipidemia, unspecified: Secondary | ICD-10-CM | POA: Diagnosis present

## 2021-05-28 DIAGNOSIS — I2699 Other pulmonary embolism without acute cor pulmonale: Secondary | ICD-10-CM | POA: Diagnosis present

## 2021-05-28 DIAGNOSIS — I313 Pericardial effusion (noninflammatory): Secondary | ICD-10-CM | POA: Diagnosis not present

## 2021-05-28 DIAGNOSIS — K389 Disease of appendix, unspecified: Secondary | ICD-10-CM | POA: Diagnosis not present

## 2021-05-28 DIAGNOSIS — Z833 Family history of diabetes mellitus: Secondary | ICD-10-CM | POA: Diagnosis not present

## 2021-05-28 DIAGNOSIS — I130 Hypertensive heart and chronic kidney disease with heart failure and stage 1 through stage 4 chronic kidney disease, or unspecified chronic kidney disease: Secondary | ICD-10-CM | POA: Diagnosis present

## 2021-05-28 DIAGNOSIS — Z20822 Contact with and (suspected) exposure to covid-19: Secondary | ICD-10-CM | POA: Diagnosis present

## 2021-05-28 DIAGNOSIS — I4811 Longstanding persistent atrial fibrillation: Secondary | ICD-10-CM | POA: Diagnosis not present

## 2021-05-28 DIAGNOSIS — I428 Other cardiomyopathies: Secondary | ICD-10-CM | POA: Diagnosis present

## 2021-05-28 DIAGNOSIS — N1831 Chronic kidney disease, stage 3a: Secondary | ICD-10-CM | POA: Diagnosis present

## 2021-05-28 DIAGNOSIS — I361 Nonrheumatic tricuspid (valve) insufficiency: Secondary | ICD-10-CM | POA: Diagnosis not present

## 2021-05-28 DIAGNOSIS — I5023 Acute on chronic systolic (congestive) heart failure: Secondary | ICD-10-CM | POA: Diagnosis not present

## 2021-05-28 DIAGNOSIS — I38 Endocarditis, valve unspecified: Secondary | ICD-10-CM | POA: Diagnosis present

## 2021-05-28 DIAGNOSIS — I50813 Acute on chronic right heart failure: Secondary | ICD-10-CM | POA: Diagnosis present

## 2021-05-28 DIAGNOSIS — Z7984 Long term (current) use of oral hypoglycemic drugs: Secondary | ICD-10-CM

## 2021-05-28 DIAGNOSIS — I272 Pulmonary hypertension, unspecified: Secondary | ICD-10-CM | POA: Diagnosis not present

## 2021-05-28 DIAGNOSIS — I4891 Unspecified atrial fibrillation: Secondary | ICD-10-CM | POA: Diagnosis present

## 2021-05-28 DIAGNOSIS — I339 Acute and subacute endocarditis, unspecified: Secondary | ICD-10-CM | POA: Diagnosis not present

## 2021-05-28 LAB — CBC WITH DIFFERENTIAL/PLATELET
Abs Immature Granulocytes: 0.02 10*3/uL (ref 0.00–0.07)
Basophils Absolute: 0 10*3/uL (ref 0.0–0.1)
Basophils Relative: 1 %
Eosinophils Absolute: 0.1 10*3/uL (ref 0.0–0.5)
Eosinophils Relative: 2 %
HCT: 39.1 % (ref 39.0–52.0)
Hemoglobin: 12.8 g/dL — ABNORMAL LOW (ref 13.0–17.0)
Immature Granulocytes: 0 %
Lymphocytes Relative: 21 %
Lymphs Abs: 1 10*3/uL (ref 0.7–4.0)
MCH: 30 pg (ref 26.0–34.0)
MCHC: 32.7 g/dL (ref 30.0–36.0)
MCV: 91.8 fL (ref 80.0–100.0)
Monocytes Absolute: 0.5 10*3/uL (ref 0.1–1.0)
Monocytes Relative: 10 %
Neutro Abs: 3.2 10*3/uL (ref 1.7–7.7)
Neutrophils Relative %: 66 %
Platelets: 175 10*3/uL (ref 150–400)
RBC: 4.26 MIL/uL (ref 4.22–5.81)
RDW: 14.8 % (ref 11.5–15.5)
WBC: 4.8 10*3/uL (ref 4.0–10.5)
nRBC: 0 % (ref 0.0–0.2)

## 2021-05-28 LAB — COMPREHENSIVE METABOLIC PANEL
ALT: 10 U/L (ref 0–44)
AST: 10 U/L — ABNORMAL LOW (ref 15–41)
Albumin: 3.6 g/dL (ref 3.5–5.0)
Alkaline Phosphatase: 50 U/L (ref 38–126)
Anion gap: 6 (ref 5–15)
BUN: 21 mg/dL (ref 8–23)
CO2: 25 mmol/L (ref 22–32)
Calcium: 9.3 mg/dL (ref 8.9–10.3)
Chloride: 103 mmol/L (ref 98–111)
Creatinine, Ser: 1.36 mg/dL — ABNORMAL HIGH (ref 0.61–1.24)
GFR, Estimated: 58 mL/min — ABNORMAL LOW (ref >60)
Glucose, Bld: 98 mg/dL (ref 70–99)
Potassium: 4.6 mmol/L (ref 3.5–5.1)
Sodium: 134 mmol/L — ABNORMAL LOW (ref 135–145)
Total Bilirubin: 0.7 mg/dL (ref 0.3–1.2)
Total Protein: 8 g/dL (ref 6.5–8.1)

## 2021-05-28 LAB — RESP PANEL BY RT-PCR (FLU A&B, COVID) ARPGX2
Influenza A by PCR: NEGATIVE
Influenza B by PCR: NEGATIVE
SARS Coronavirus 2 by RT PCR: NEGATIVE

## 2021-05-28 LAB — URINALYSIS, ROUTINE W REFLEX MICROSCOPIC
Bacteria, UA: NONE SEEN
Bilirubin Urine: NEGATIVE
Glucose, UA: >=500 mg/dL — AB
Ketones, ur: NEGATIVE mg/dL
Leukocytes,Ua: NEGATIVE
Nitrite: NEGATIVE
Protein, ur: NEGATIVE mg/dL
Specific Gravity, Urine: 1.008 (ref 1.005–1.030)
pH: 6 (ref 5.0–8.0)

## 2021-05-28 LAB — HIV ANTIBODY (ROUTINE TESTING W REFLEX): HIV Screen 4th Generation wRfx: NONREACTIVE

## 2021-05-28 LAB — SEDIMENTATION RATE: Sed Rate: 58 mm/hr — ABNORMAL HIGH (ref 0–16)

## 2021-05-28 LAB — LACTIC ACID, PLASMA: Lactic Acid, Venous: 1.4 mmol/L (ref 0.5–1.9)

## 2021-05-28 LAB — PROTIME-INR
INR: 1.7 — ABNORMAL HIGH (ref 0.8–1.2)
Prothrombin Time: 20.3 seconds — ABNORMAL HIGH (ref 11.4–15.2)

## 2021-05-28 LAB — HEPARIN LEVEL (UNFRACTIONATED): Heparin Unfractionated: >1.1 IU/mL — ABNORMAL HIGH (ref 0.30–0.70)

## 2021-05-28 LAB — APTT: aPTT: 40 seconds — ABNORMAL HIGH (ref 24–36)

## 2021-05-28 LAB — C-REACTIVE PROTEIN: CRP: 9.6 mg/dL — ABNORMAL HIGH (ref <1.0)

## 2021-05-28 MED ORDER — VANCOMYCIN HCL IN DEXTROSE 1-5 GM/200ML-% IV SOLN
1000.0000 mg | Freq: Two times a day (BID) | INTRAVENOUS | Status: DC
  Administered 2021-05-29 – 2021-06-01 (×8): 1000 mg via INTRAVENOUS
  Filled 2021-05-28 (×10): qty 200

## 2021-05-28 MED ORDER — SPIRONOLACTONE 25 MG PO TABS
12.5000 mg | ORAL_TABLET | Freq: Every day | ORAL | Status: DC
  Administered 2021-05-29 – 2021-06-04 (×6): 12.5 mg via ORAL
  Filled 2021-05-28 (×9): qty 0.5

## 2021-05-28 MED ORDER — HEPARIN (PORCINE) 25000 UT/250ML-% IV SOLN
2100.0000 [IU]/h | INTRAVENOUS | Status: DC
  Administered 2021-05-28 – 2021-05-29 (×3): 1800 [IU]/h via INTRAVENOUS
  Administered 2021-05-30 – 2021-06-01 (×4): 1850 [IU]/h via INTRAVENOUS
  Administered 2021-06-01: 1900 [IU]/h via INTRAVENOUS
  Administered 2021-06-02: 2100 [IU]/h via INTRAVENOUS
  Filled 2021-05-28 (×9): qty 250

## 2021-05-28 MED ORDER — SODIUM CHLORIDE 0.9% FLUSH
3.0000 mL | Freq: Two times a day (BID) | INTRAVENOUS | Status: DC
  Administered 2021-05-29 – 2021-06-03 (×3): 3 mL via INTRAVENOUS

## 2021-05-28 MED ORDER — ACETAMINOPHEN 650 MG RE SUPP: 650.0000 mg | Freq: Four times a day (QID) | RECTAL | Status: DC | PRN

## 2021-05-28 MED ORDER — SODIUM CHLORIDE 0.9 % IV SOLN
2.0000 g | Freq: Three times a day (TID) | INTRAVENOUS | Status: DC
  Administered 2021-05-28 – 2021-06-02 (×15): 2 g via INTRAVENOUS
  Filled 2021-05-28 (×16): qty 2

## 2021-05-28 MED ORDER — VANCOMYCIN HCL IN DEXTROSE 1-5 GM/200ML-% IV SOLN: 1000.0000 mg | Freq: Once | INTRAVENOUS | Status: DC

## 2021-05-28 MED ORDER — CARVEDILOL 3.125 MG PO TABS
3.1250 mg | ORAL_TABLET | Freq: Two times a day (BID) | ORAL | Status: DC
  Administered 2021-05-28 – 2021-06-04 (×14): 3.125 mg via ORAL
  Filled 2021-05-28 (×14): qty 1

## 2021-05-28 MED ORDER — POTASSIUM CHLORIDE CRYS ER 10 MEQ PO TBCR
10.0000 meq | EXTENDED_RELEASE_TABLET | Freq: Every day | ORAL | Status: DC
  Administered 2021-05-29 – 2021-06-04 (×7): 10 meq via ORAL
  Filled 2021-05-28 (×7): qty 1

## 2021-05-28 MED ORDER — ONDANSETRON HCL 4 MG PO TABS: 4.0000 mg | ORAL_TABLET | Freq: Four times a day (QID) | ORAL | Status: DC | PRN

## 2021-05-28 MED ORDER — SACUBITRIL-VALSARTAN 24-26 MG PO TABS
1.0000 | ORAL_TABLET | Freq: Two times a day (BID) | ORAL | Status: DC
  Administered 2021-05-28 – 2021-06-04 (×13): 1 via ORAL
  Filled 2021-05-28 (×14): qty 1

## 2021-05-28 MED ORDER — FUROSEMIDE 20 MG PO TABS
20.0000 mg | ORAL_TABLET | Freq: Every day | ORAL | Status: DC
  Administered 2021-05-29 – 2021-06-01 (×4): 20 mg via ORAL
  Filled 2021-05-28 (×4): qty 1

## 2021-05-28 MED ORDER — VITAMIN D 25 MCG (1000 UNIT) PO TABS
2000.0000 [IU] | ORAL_TABLET | Freq: Every day | ORAL | Status: DC
  Administered 2021-05-29 – 2021-06-04 (×7): 2000 [IU] via ORAL
  Filled 2021-05-28 (×7): qty 2

## 2021-05-28 MED ORDER — FINASTERIDE 5 MG PO TABS
5.0000 mg | ORAL_TABLET | Freq: Every day | ORAL | Status: DC
  Administered 2021-05-29 – 2021-06-04 (×7): 5 mg via ORAL
  Filled 2021-05-28 (×7): qty 1

## 2021-05-28 MED ORDER — SODIUM CHLORIDE 0.9 % IV SOLN: 2.0000 g | Freq: Once | INTRAVENOUS | Status: DC

## 2021-05-28 MED ORDER — BISACODYL 10 MG RE SUPP: 10.0000 mg | Freq: Every day | RECTAL | Status: DC | PRN

## 2021-05-28 MED ORDER — APIXABAN 5 MG PO TABS: 5.0000 mg | ORAL_TABLET | Freq: Two times a day (BID) | ORAL | Status: DC

## 2021-05-28 MED ORDER — METRONIDAZOLE 500 MG/100ML IV SOLN
500.0000 mg | Freq: Once | INTRAVENOUS | Status: AC
  Administered 2021-05-28: 500 mg via INTRAVENOUS
  Filled 2021-05-28: qty 100

## 2021-05-28 MED ORDER — SODIUM CHLORIDE 0.9 % IV SOLN: 250.0000 mL | INTRAVENOUS | Status: DC | PRN

## 2021-05-28 MED ORDER — PRAVASTATIN SODIUM 40 MG PO TABS
40.0000 mg | ORAL_TABLET | Freq: Every day | ORAL | Status: DC
  Administered 2021-05-28 – 2021-06-03 (×7): 40 mg via ORAL
  Filled 2021-05-28 (×7): qty 1

## 2021-05-28 MED ORDER — SODIUM CHLORIDE 0.9% FLUSH: 3.0000 mL | INTRAVENOUS | Status: DC | PRN

## 2021-05-28 MED ORDER — ACETAMINOPHEN 325 MG PO TABS
650.0000 mg | ORAL_TABLET | Freq: Four times a day (QID) | ORAL | Status: DC | PRN
  Administered 2021-05-29 – 2021-06-02 (×2): 650 mg via ORAL
  Filled 2021-05-28 (×2): qty 2

## 2021-05-28 MED ORDER — SODIUM CHLORIDE 0.9% FLUSH
3.0000 mL | Freq: Two times a day (BID) | INTRAVENOUS | Status: DC
  Administered 2021-05-28 – 2021-06-03 (×6): 3 mL via INTRAVENOUS

## 2021-05-28 MED ORDER — ADULT MULTIVITAMIN W/MINERALS CH
1.0000 | ORAL_TABLET | Freq: Every day | ORAL | Status: DC
  Administered 2021-05-29 – 2021-06-04 (×7): 1 via ORAL
  Filled 2021-05-28 (×7): qty 1

## 2021-05-28 MED ORDER — ONDANSETRON HCL 4 MG/2ML IJ SOLN: 4.0000 mg | Freq: Four times a day (QID) | INTRAMUSCULAR | Status: DC | PRN

## 2021-05-28 MED ORDER — VANCOMYCIN HCL 2000 MG/400ML IV SOLN
2000.0000 mg | Freq: Once | INTRAVENOUS | Status: AC
  Administered 2021-05-28: 2000 mg via INTRAVENOUS
  Filled 2021-05-28: qty 400

## 2021-05-28 MED ORDER — SODIUM CHLORIDE 0.9 % IV BOLUS
1000.0000 mL | Freq: Once | INTRAVENOUS | Status: AC
  Administered 2021-05-28: 1000 mL via INTRAVENOUS

## 2021-05-28 MED ORDER — POLYETHYLENE GLYCOL 3350 17 G PO PACK
17.0000 g | PACK | Freq: Every day | ORAL | Status: DC | PRN
  Administered 2021-05-30 – 2021-06-01 (×3): 17 g via ORAL
  Filled 2021-05-28 (×3): qty 1

## 2021-05-28 NOTE — Telephone Encounter (Signed)
-----   Message from Satira Sark, MD sent at 05/28/2021 10:09 AM EDT ----- Regarding: RE: CT results I reviewed the chest CT report.  My recommendation would be for Dr. Jeffie Pollock to speak directly with the pulmonologist on call today at [facility] to see if this is something that needs inpatient evaluation or whether he can be evaluated safely as an outpatient. ----- Message ----- From: Dorisann Frames, RN Sent: 05/28/2021   9:28 AM EDT To: Satira Sark, MD Subject: CT results                                     Hi Dr. Domenic Polite,  Please see below Dr. Jeffie Pollock message concerning a mutal patient. I have placed the pulmonary consult.     I got a call from radiology regarding the Chest CT.  He has a lot of things going on including pulmonary emboli, he is already on antiplatelet therapy.  He may have atypical pneumonia and he has some vascular abnormalities that they can't explain.   We need to have his cardiologist made aware of this and he needs an urgent referral to pulmonary.     Thanks- Producer, television/film/video

## 2021-05-28 NOTE — Telephone Encounter (Signed)
Patient is presently at [facility] with Dr. Arnoldo Morale beside our office.  Dr. Arnoldo Morale walked over to discuss with Dr. Jeffie Pollock who both agree for patient to go to [facility] ER now.  Dr. Arnoldo Morale will have transport take patient ER. Heather, Triage nurse at [facility] called and made aware of patient.

## 2021-05-28 NOTE — ED Triage Notes (Signed)
Pt reports he had a CT on Monday and was reported to return due to abnormal results; pt denies any symptoms and SOB

## 2021-05-28 NOTE — Telephone Encounter (Signed)
I called patient and gave patient results-  Also placed pulmonary consult and message sent to Dr. Domenic Polite.

## 2021-05-28 NOTE — ED Provider Notes (Signed)
[facility] EMERGENCY DEPARTMENT Provider Note   CSN: [id number] Arrival date & time: 05/28/21  1046     History Chief Complaint  Patient presents with   abnormal ct    [name] [name] is a 65 y.o. male.  HPI   Pt is a 65 y/o male with a h/o anemia, afib, avm, cardiac arrest s/p ICD,  CHF, htn, colonic polyps, pneumonia who presents to the ED for eval of an abnormal CT scan that he had done as an outpatient.   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 The nodule in the RIGHT upper lobe just above the minor fissure with indistinct margins is likely new since the prior study which supports an infectious etiology as a unifying diagnosis."  Pt states he has been feeling alright recently. Denies chest pain, sob, cough, hemoptysis, leg swelling, calf pain, fevers, chills, sweats, urinary or GI sxs.   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 in Progress 05/28/2021   Chronic anticoagulation 05/28/2021   Pulmonary embolism ([place]) 05/28/2021   Hyponatremia 05/13/2017   Gas pain 05/13/2017   CKD (chronic kidney disease) stage 2, GFR 60-89 ml/min 09/14/2012   Cardiomyopathy, nonischemic (HCC)    Essential hypertension, benign    Implantable cardioverter-defibrillator (ICD) in situ 11/10/2009   Atrial fibrillation ([place])  14/48/1856   Chronic systolic heart failure ([place]) 07/29/2009    Past Surgical History:  Procedure Laterality Date   [place]   COLONOSCOPY  09/26/2012   Procedure: COLONOSCOPY;  Surgeon: Beryle Beams, MD;  Location: [facility] ENDOSCOPY;  Service: Endoscopy;  Laterality: N/A;   Defibrillator system revision  04/12/12   MDT Protecta XT VR implanted at [facility] by Dr Deno Etienne with previously implanted system and leads extracted due to RV lead failure   ESOPHAGOGASTRODUODENOSCOPY  09/25/2012   Procedure: ESOPHAGOGASTRODUODENOSCOPY (EGD);  Surgeon: Beryle Beams, MD;  Location: [facility] ENDOSCOPY;  Service: Endoscopy;  Laterality: N/A;       Family History  Problem Relation Age of Onset   Diabetes Mother        Died @ 28.   Hypertension Father        Alive @ 15.   Alzheimer's disease Father    Diabetes Brother        Brother also has htn   Hypertension Brother     Social History   Tobacco Use   Smoking status: Former    Packs/day: 0.80    Years: 6.00    Pack years: 4.80    Types: Cigarettes    Start date: 05/13/1978    Quit date: 11/15/1996    Years since quitting: 24.5   Smokeless tobacco: Never  Vaping Use   Vaping Use: Never used  Substance Use Topics  Alcohol use: No    Alcohol/week: 0.0 standard drinks   Drug use: Not Currently    Types: Marijuana    Comment: Many years ago    Home Medications Prior to Admission medications   Medication Sig Start Date End Date Taking? Authorizing Provider  carvedilol (COREG) 25 MG tablet TAKE 2 TABLETS (50 MG TOTAL) BY MOUTH 2 (TWO) TIMES DAILY WITH A MEAL. *DOSE INCREASE 05/26/21   Satira Sark, MD  Cholecalciferol (VITAMIN D3) 2000 UNITS capsule Take 2,000 Units by mouth daily.    [provider]  dapagliflozin propanediol (FARXIGA) 10 MG TABS tablet Take 1 tablet (10 mg total) by mouth daily before breakfast. 04/21/21   Satira Sark, MD  ELIQUIS 5 MG TABS tablet TAKE 1 TABLET BY MOUTH TWICE A  DAY 12/24/20   Satira Sark, MD  ENTRESTO 24-26 MG TAKE 1 TABLET BY MOUTH TWICE A DAY 04/21/21   Satira Sark, MD  finasteride (PROSCAR) 5 MG tablet TAKE 1 TABLET BY MOUTH EVERY DAY 01/11/21   Irine Seal, MD  furosemide (LASIX) 20 MG tablet Take 1 tablet (20 mg total) by mouth daily. 04/21/21   Satira Sark, MD  polyethylene glycol powder (GLYCOLAX/MIRALAX) powder Take 17 g by mouth daily as needed. For constipation - mix with 8 oz liquid and drink 04/19/12   [provider]  potassium chloride (MICRO-K) 10 MEQ CR capsule Take 1 capsule (10 mEq total) by mouth daily. 02/24/12   de Stanford Scotland, MD  pravastatin (PRAVACHOL) 40 MG tablet Take 40 mg by mouth at bedtime.    [provider]  spironolactone (ALDACTONE) 25 MG tablet TAKE 1/2 TABLET BY MOUTH EVERY DAY 06/09/20   Satira Sark, MD    Allergies    Patient has no known allergies.  Review of Systems   Review of Systems  Constitutional:  Negative for chills and fever.  HENT:  Negative for ear pain and sore throat.   Eyes:  Negative for visual disturbance.  Respiratory:  Negative for cough and shortness of breath.   Cardiovascular:  Negative for chest pain.  Gastrointestinal:  Negative for abdominal pain, constipation, diarrhea, nausea and vomiting.  Genitourinary:  Negative for dysuria and hematuria.  Musculoskeletal:  Negative for back pain.  Skin:  Negative for rash.  Neurological:  Negative for headaches.  All other systems reviewed and are negative.  Physical Exam Updated Vital Signs BP 108/75   Pulse 63   Temp 98 F (36.7 C) (Oral)   Resp (!) 22   Ht 6\' 4"  (1.93 m)   Wt 107 kg   SpO2 98%   BMI 28.73 kg/m   Physical Exam Vitals and nursing note reviewed.  Constitutional:      Appearance: He is well-developed.  HENT:     Head: Normocephalic and atraumatic.  Eyes:     Conjunctiva/sclera: Conjunctivae normal.  Cardiovascular:     Heart sounds: No murmur heard.    Comments:  Irregularly irregular Pulmonary:     Effort: Pulmonary effort is normal. No tachypnea or respiratory distress.     Breath sounds: Examination of the right-lower field reveals rales. Rales present.  Abdominal:     Palpations: Abdomen is soft.     Tenderness: There is no abdominal tenderness.  Musculoskeletal:     Cervical back: Neck supple.  Skin:    General: Skin is warm and dry.  Neurological:     Mental Status: He is alert.    ED  Results / Procedures / Treatments   Labs (all labs ordered are listed, but only abnormal results are displayed) Labs Reviewed  COMPREHENSIVE METABOLIC PANEL - Abnormal; Notable for the following components:      Result Value   Sodium 134 (*)    Creatinine, Ser 1.36 (*)    AST 10 (*)    GFR, Estimated 58 (*)    All other components within normal limits  CBC WITH DIFFERENTIAL/PLATELET - Abnormal; Notable for the following components:   Hemoglobin 12.8 (*)    All other components within normal limits  PROTIME-INR - Abnormal; Notable for the following components:   Prothrombin Time 20.3 (*)    INR 1.7 (*)    All other components within normal limits  APTT - Abnormal; Notable for the following components:   aPTT 40 (*)    All other components within normal limits  URINALYSIS, ROUTINE W REFLEX MICROSCOPIC - Abnormal; Notable for the following components:   Glucose, UA >=500 (*)    Hgb urine dipstick MODERATE (*)    All other components within normal limits  RESP PANEL BY RT-PCR (FLU A&B, COVID) ARPGX2  CULTURE, BLOOD (ROUTINE X 2)  CULTURE, BLOOD (ROUTINE X 2)  URINE CULTURE  LACTIC ACID, PLASMA  HIV ANTIBODY (ROUTINE TESTING W REFLEX)  HEPARIN LEVEL (UNFRACTIONATED)  C-REACTIVE PROTEIN  SEDIMENTATION RATE    EKG EKG Interpretation  Date/Time:  Thursday May 28 2021 11:18:08 EDT Ventricular Rate:  85 PR Interval:    QRS Duration: 110 QT Interval:  375 QTC Calculation: 446 R Axis:   97 Text Interpretation: Atrial fibrillation Right  axis deviation Low voltage, extremity leads Consider anterior infarct Confirmed by Fredia Sorrow [phone]) on 05/28/2021 2:07:24 PM  Radiology DG Chest Port 1 View  Result Date: 05/28/2021 CLINICAL DATA:  Question sepsis EXAM: PORTABLE CHEST 1 VIEW COMPARISON:  Chest x-ray 05/13/2017.  Chest CT 05/25/2021 FINDINGS: Cardiac enlargement. AICD in satisfactory position. Negative for heart failure. No pleural effusion Approximately 15 mm ill-defined nodules in the right lung as noted on CT. IMPRESSION: Stable  lung nodules on the right, nonspecific in appearance. Negative for heart failure or pneumonia. Electronically Signed   By: Franchot Gallo M.D.   On: 05/28/2021 13:54    Procedures Procedures   3:11 PM Cardiac monitoring reveals afib, hr 70s (Rate & rhythm), as reviewed and interpreted by me. Cardiac monitoring was ordered due to afib and to monitor patient for dysrhythmia.   Medications Ordered in ED Medications  ceFEPIme (MAXIPIME) 2 g in sodium chloride 0.9 % 100 mL IVPB (0 g Intravenous Stopped 05/28/21 1501)  vancomycin (VANCOREADY) IVPB 2000 mg/400 mL (has no administration in time range)  vancomycin (VANCOCIN) IVPB 1000 mg/200 mL premix (has no administration in time range)  carvedilol (COREG) tablet 3.125 mg (has no administration in time range)  sacubitril-valsartan (ENTRESTO) 24-26 mg per tablet (has no administration in time range)  furosemide (LASIX) tablet 20 mg (has no administration in time range)  pravastatin (PRAVACHOL) tablet 40 mg (has no administration in time range)  spironolactone (ALDACTONE) tablet 12.5 mg (has no administration in time range)  finasteride (PROSCAR) tablet 5 mg (has no administration in time range)  apixaban (ELIQUIS) tablet 5 mg (has no administration in time range)  cholecalciferol (VITAMIN D3) tablet 2,000 Units (has no administration in time range)  potassium chloride SA (KLOR-CON) CR tablet 10 mEq (has no administration in time range)  sodium  chloride flush (NS) 0.9 % injection 3 mL (has no administration  in time range)  sodium chloride flush (NS) 0.9 % injection 3 mL (has no administration in time range)  sodium chloride flush (NS) 0.9 % injection 3 mL (has no administration in time range)  0.9 %  sodium chloride infusion (has no administration in time range)  acetaminophen (TYLENOL) tablet 650 mg (has no administration in time range)    Or  acetaminophen (TYLENOL) suppository 650 mg (has no administration in time range)  polyethylene glycol (MIRALAX / GLYCOLAX) packet 17 g (has no administration in time range)  bisacodyl (DULCOLAX) suppository 10 mg (has no administration in time range)  ondansetron (ZOFRAN) tablet 4 mg (has no administration in time range)    Or  ondansetron (ZOFRAN) injection 4 mg (has no administration in time range)  multivitamin with minerals tablet 1 tablet (has no administration in time range)  metroNIDAZOLE (FLAGYL) IVPB 500 mg (500 mg Intravenous New Bag/Given 05/28/21 1501)  sodium chloride 0.9 % bolus 1,000 mL (1,000 mLs Intravenous New Bag/Given 05/28/21 1458)    ED Course  I have reviewed the triage vital signs and the nursing notes.  Pertinent labs & imaging results that were available during my care of the patient were reviewed by me and considered in my medical decision making (see chart for details).    MDM Rules/Calculators/A&P                          65 y/o M presents for eval of abnormal CT. 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 Negative for heart  failure or pneumonia.  Pt with abnormal ct concerning for endocarditis. Iv abx and ivf started in the ed. Will admit.  2:22 PM CONSULT with Dr. Juleen China with infectious disease. He recommends admission for w/u of endocarditis. Recommends to start empirically on broad spectrum abx. Recommends transfer to [facility].  2:35 PM CONSULT with Dr. Denton Brick who accepts patient for admission. Recommends formal pulm consult.  3:17 PM CONSULT with Dr. Melvyn Novas with pulmonology who agrees to consult on the patient  Final Clinical Impression(s) / ED Diagnoses Final diagnoses:  Endocarditis, unspecified chronicity, unspecified endocarditis type    Rx / DC Orders ED Discharge Orders     None        Rodney Booze, PA-C 05/28/21 1612    Fredia Sorrow, MD 06/08/21 [phone]

## 2021-05-28 NOTE — Consult Note (Signed)
NAME:  [name] [name], MRN:  [id number], DOB:  08-20-1956, LOS: 0 ADMISSION DATE:  05/28/2021, CONSULTATION DATE:  05/28/21 REFERRING MD:  Mariea Clonts, [facility], CHIEF COMPLAINT:  ? Septic emboli in pt on DOAC    History of Present Illness:  64 yobm remote smoker with h/o Afib, sp arrest with ICD, chf/ hbp/ and anemia sent for f/u CT for lung nodule and found to have pattern c/w septic emboli while on DOAC and PCCM and ID asked to evaluate pending transfer to [facility]   Pertinent  Medical History  Anemia  Atrial fibrillation  AVM    S/p Cardiac arrest  Cardiomyopathy, nonischemic     Essential hypertension   Significant Hospital Events: Including procedures, antibiotic start and stop dates in addition to other pertinent events   Viral resp profile 7/14 neg Bc x 2 7/14 >>> Echo 7/14 >>> Venous dopplers 7/14 >>>  Interim History / Subjective:   No additional hx  - he has no sob/cough or cp or arthralgias/ rash   Objective   Blood pressure 108/75, pulse 63, temperature 98 F (36.7 C), temperature source Oral, resp. rate (!) 22, height 6\' 4"  (1.93 m), weight 107 kg, SpO2 98 %.       No intake or output data in the 24 hours ending 05/28/21 1532 Filed Weights   05/28/21 1118 05/28/21 1157  Weight: 107 kg 107 kg    Examination: Tmax 98.2  General appearance:    pleasant bm who is surprised to be in ER   At Rest 02 sats  98% on RA  No jvd Oropharynx clear,  mucosa nl Neck supple Lungs clear bilaterally RRR no s3 or  1/6 sem   Abd soft with nl  excursion  Extr warm with no edema or clubbing noted Neuro  Sensorium intact,  no apparent motor deficits     I personally reviewed images and agree with radiology impression as follows:  CXR:   05/15/21 Stable  lung nodules on the right, nonspecific in appearance. Negative for heart failure or pneumonia.  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 or  thrombophebitis/ vasculitis (in that order of likelihood)    - sats and hemodynamics nl  - on DOAC pre admit > changed to IV Hep on admit per [facility] - ID rx approp   >>> rec venous dopplers and echo to complete the intial w/u pending  >>> also baseline ESR/ANA/ANCA profile and D dimer (for f/u as outpt)   >>> no need for active pcmm f/u at this point > his lungs are doing exactly what they were intended to do so ok to admit to [facility] and consult PCCM again prn   Labs   CBC: Recent Labs  Lab 05/28/21 1350  WBC 4.8  NEUTROABS 3.2  HGB 12.8*  HCT 39.1  MCV 91.8  PLT 175    Basic Metabolic Panel: Recent Labs  Lab 05/28/21 1350  NA 134*  K 4.6  CL 103  CO2 25  GLUCOSE 98  BUN 21  CREATININE 1.36*  CALCIUM 9.3   GFR: Estimated Creatinine Clearance: 74.6 mL/min (A) (by C-G formula based on SCr of 1.36 mg/dL (H)). Recent Labs  Lab 05/28/21 1339 05/28/21 1350  WBC  --  4.8  LATICACIDVEN 1.4  --     Liver Function Tests: Recent Labs  Lab 05/28/21 1350  AST 10*  ALT 10  ALKPHOS 50  BILITOT 0.7  PROT 8.0  ALBUMIN 3.6   No results for input(s): LIPASE, AMYLASE in the last 168 hours. No results for input(s): AMMONIA in the last 168 hours.  ABG    Component Value Date/Time   PHART 7.468 (H) 09/15/2012 0132   PCO2ART 30.9 (L) 09/15/2012 0132   PO2ART 96.0 09/15/2012 0132   HCO3 22.4 09/15/2012 0132   TCO2 23 09/15/2012 0132   ACIDBASEDEF 1.0 09/15/2012 0132   O2SAT 98.0 09/15/2012 0132     Coagulation Profile: Recent Labs  Lab 05/28/21 1350  INR 1.7*    Cardiac Enzymes: No results for input(s): CKTOTAL, CKMB, CKMBINDEX, TROPONINI in the last 168 hours.  HbA1C: No results found for: HGBA1C  CBG: No results for input(s): GLUCAP in the last 168 hours.    Past Medical History:  He,  has a past medical history of Anemia, Atrial fibrillation ([place]), AVM (arteriovenous malformation), Cardiac arrest ([place]) (1998), Cardiomyopathy, nonischemic ([place]),  Chronic systolic heart failure ([place]), Essential hypertension, History of colonic polyps, and Pneumonia.   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 His smoking use included cigarettes. He started smoking about 43 years ago. He has a 4.80 pack-year smoking history. He has never used smokeless tobacco. He reports previous drug use. Drug: Marijuana. He reports that he does not drink alcohol.   Family History:  His family history includes Alzheimer's disease in his father; Diabetes in his brother and mother; Hypertension in his brother and father.   Allergies No Known Allergies   Home Medications  Prior to Admission medications   Medication Sig Start Date End Date Taking? Authorizing Provider  carvedilol (COREG) 25 MG tablet TAKE 2 TABLETS (50 MG TOTAL) BY MOUTH 2 (TWO) TIMES DAILY WITH A MEAL. *DOSE INCREASE 05/26/21   Satira Sark, MD  Cholecalciferol (VITAMIN D3) 2000 UNITS capsule Take 2,000 Units by mouth daily.    [provider]  dapagliflozin propanediol (FARXIGA) 10 MG TABS tablet Take 1 tablet (10 mg total) by mouth daily before breakfast. 04/21/21   Satira Sark, MD  ELIQUIS 5 MG TABS tablet TAKE 1 TABLET BY MOUTH TWICE A DAY 12/24/20   Satira Sark, MD  ENTRESTO 24-26 MG TAKE 1 TABLET BY MOUTH TWICE A DAY 04/21/21   Satira Sark, MD  finasteride (PROSCAR) 5 MG tablet TAKE 1 TABLET BY MOUTH EVERY DAY 01/11/21   Irine Seal, MD  furosemide (LASIX) 20 MG tablet Take 1 tablet (20 mg total) by mouth daily. 04/21/21   Satira Sark, MD  polyethylene glycol powder (GLYCOLAX/MIRALAX) powder Take 17 g by mouth daily as needed. For constipation - mix with 8 oz liquid and drink 04/19/12   [provider]  potassium chloride (MICRO-K) 10 MEQ CR capsule Take 1 capsule (10 mEq total) by mouth daily. 02/24/12   de  Stanford Scotland, MD  pravastatin (PRAVACHOL) 40 MG tablet Take 40 mg by mouth at bedtime.    [provider]  spironolactone (ALDACTONE) 25 MG tablet TAKE 1/2 TABLET BY MOUTH EVERY DAY 06/09/20   Satira Sark, MD      Christinia Gully, MD Pulmonary and [place] [phone]   After 7:00 pm call Elink  [phone]

## 2021-05-28 NOTE — Progress Notes (Signed)
[name] [name]; [id number]; 1956-02-25   HPI Patient is a 65 year old black male who was referred to my care by Dr. Jeffie Pollock of urology for evaluation and treatment of possible appendicitis.  This was based on a CT scan done on Apr 09, 2021.  Patient states he was called by his urologist to follow-up with a general surgeon.  He denies any nausea, vomiting, right lower quadrant abdominal pain, fever or chills.  He also recently had a CT scan of the chest.  Patient has multiple comorbidities including cardiopulmonary disease, on chronic anticoagulation for history of atrial fibrillation. Past Medical History:  Diagnosis Date   Anemia    Atrial fibrillation ([place])    AVM (arteriovenous malformation)    Duodenum - nonbleeding and EGD 11/13   Cardiac arrest ([place]) 1998   ICD implanted at [facility]   Cardiomyopathy, nonischemic ([place])    LVEF 16-60%   Chronic systolic heart failure ([place])    Essential hypertension    History of colonic polyps    Colonoscopy 11/13   Pneumonia    Severe with respiratory failure 10/13    Past Surgical History:  Procedure Laterality Date   [place]   COLONOSCOPY  09/26/2012   Procedure: COLONOSCOPY;  Surgeon: Beryle Beams, MD;  Location: [place];  Service: Endoscopy;  Laterality: N/A;   Defibrillator system revision  04/12/12   MDT Protecta XT VR implanted at 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   Hypertension Father        Alive @ 16.   Alzheimer's disease Father    Diabetes Brother        Brother also has htn   Hypertension Brother     No current facility-administered medications on file prior to  visit.   Current Outpatient Medications on File Prior to Visit  Medication Sig Dispense Refill   carvedilol (COREG) 25 MG tablet TAKE 2 TABLETS (50 MG TOTAL) BY MOUTH 2 (TWO) TIMES DAILY WITH A MEAL. *DOSE INCREASE (Patient taking differently: Take 50 mg by mouth 2 (two) times daily with a meal.) 90 tablet 3   Cholecalciferol (VITAMIN D3) 2000 UNITS capsule Take 2,000 Units by mouth daily.     dapagliflozin propanediol (FARXIGA) 10 MG TABS tablet Take 1 tablet (10 mg total) by mouth daily before breakfast. 30 tablet 6   ELIQUIS 5 MG TABS tablet TAKE 1 TABLET BY MOUTH TWICE A DAY 60 tablet 6   ENTRESTO 24-26 MG TAKE 1 TABLET BY MOUTH TWICE A DAY 60 tablet 6   finasteride (PROSCAR) 5 MG tablet TAKE 1 TABLET BY MOUTH EVERY DAY 90 tablet 3   furosemide (LASIX) 20 MG tablet Take 1 tablet (20 mg total) by mouth daily. 90 tablet 3   polyethylene glycol powder (GLYCOLAX/MIRALAX) powder Take 17 g by mouth daily as needed. For constipation - mix with 8 oz liquid and drink     potassium chloride (MICRO-K) 10 MEQ CR capsule Take 1 capsule (10 mEq total) by mouth daily. 90 capsule 3   pravastatin (PRAVACHOL) 40 MG tablet Take 40 mg by mouth daily.     spironolactone (ALDACTONE) 25 MG  tablet TAKE 1/2 TABLET BY MOUTH EVERY DAY 45 tablet 3    No Known Allergies  Social History   Substance and Sexual Activity  Alcohol Use No   Alcohol/week: 0.0 standard drinks    Social History   Tobacco Use  Smoking Status Former   Packs/day: 0.80   Years: 6.00   Pack years: 4.80   Types: Cigarettes   Start date: 05/13/1978   Quit date: 11/15/1996   Years since quitting: 24.5  Smokeless Tobacco Never    Review of Systems  Constitutional: Negative.   HENT: Negative.    Eyes: Negative.   Respiratory: Negative.    Cardiovascular: Negative.   Gastrointestinal: Negative.   Genitourinary: Negative.   Musculoskeletal: Negative.   Skin: Negative.   Neurological: Negative.   Endo/Heme/Allergies: Negative.    Psychiatric/Behavioral: Negative.     Objective   Vitals:   05/28/21 0952  BP: (!) 82/57  Pulse: 68  Resp: 16  Temp: 98.2 F (36.8 C)  SpO2: 97%    Physical Exam Vitals reviewed.  Constitutional:      Appearance: Normal appearance. He is not ill-appearing.  HENT:     Head: Normocephalic and atraumatic.  Cardiovascular:     Rate and Rhythm: Normal rate. Rhythm irregular.     Heart sounds: Normal heart sounds. No murmur heard.   No gallop.  Pulmonary:     Effort: Pulmonary effort is normal. No respiratory distress.     Breath sounds: Normal breath sounds. No stridor. No wheezing, rhonchi or rales.  Abdominal:     General: Bowel sounds are normal. There is no distension.     Palpations: Abdomen is soft. There is no mass.     Tenderness: There is no abdominal tenderness. There is no guarding or rebound.     Hernia: No hernia is present.     Comments: Patient has no tenderness in the right lower quadrant to deep palpation.  No rigidity is noted.  Neurological:     Mental Status: He is alert and oriented to person, place, and time.   CT scan results were obtained from urology office. Assessment  Patient clinically does not have appendicitis.  He does not have any stigmata of appendicitis.  Patient has multiple comorbidities, with newly found possible pneumonia or pulmonary emboli. Plan  In discussion with Dr. Ralene Muskrat office with newly diagnosed possible pulmonary emboli, the patient has been told to go to the emergency room for further evaluation and treatment by cardiology and pulmonology.  Follow-up here as needed.  I would recommend a colonoscopy once patient's condition has stabilized to fully assess the cecum.

## 2021-05-28 NOTE — Telephone Encounter (Signed)
-----   Message from Irine Seal, MD sent at 05/27/2021  4:56 PM EDT ----- I got a call from radiology regarding the Chest CT.  He has a lot of things going on including pulmonary emboli, he is already on antiplatelet therapy.  He may have atypical pneumonia and he has some vascular abnormalities that they can't explain.   We need to have his cardiologist made aware of this and he needs an urgent referral to pulmonary.

## 2021-05-28 NOTE — H&P (Signed)
Patient Demographics:    [name] [name], is a 65 y.o. 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  -The nodule in the RIGHT upper lobe just above the minor fissure with indistinct margins is likely new since the prior study which supports an infectious etiology as a unifying diagnosis.  -Discussed with ID physician Dr. Juleen China who -Recommends blood cultures, --TTE pending, -Low threshold for TEE better with blood culture results -Cefepime and Vancomycin for now pending further culture data -Pulmonology consult from Dr. Melvyn Novas pending  2)Nonischemic cardiomyopathy with LVEF 30 to 35% and chronic combined CHF/s/p prior cardiac arrest and status post [employer] AICD- -Follows with electrophysiologist Dr. Rayann Heman  Medtronic AICD was interrogated in March 2022 without significant abnormalities noted no recent shocks or syncope   -Continue Coreg and Lasix, as well as Entresto and Aldactone -Hold Farxiga   3)Permanent A.  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--continue Aldactone -Okay to use lactulose as needed  -no encephalopathy at this time  5)Scattered subsegmental Pulmonary Emboli--- despite Eliquis use PTA -- Hold Eliquis and start IV heparin -Please see 1 above  6)CKD stage - 3A   -renal function appears to be close to baseline at this time ----Renally adjust medications, avoid nephrotoxic agents / dehydration  / hypotension -Hold Farxiga  7)DM2- hold Farxiga,  Use Novolog/Humalog Sliding scale insulin with Accu-Cheks/Fingersticks as ordered   Disposition/Need for in-Hospital Stay- patient unable to be discharged at this time due to bilateral PE and possible septic emboli requiring IV antibiotics and IV heparin pending further work-up--  Status is: Inpatient  Remains inpatient appropriate because: See disposition above  Dispo: The patient is from: Home              Anticipated d/c is to: Home              Anticipated d/c date is: > 3 days              Patient currently is not medically stable to d/c. Barriers: Not Clinically Stable-  With History of - Reviewed by me  Past Medical History:  Diagnosis Date   Anemia    Atrial fibrillation ([place])    AVM (arteriovenous malformation)    Duodenum - nonbleeding and EGD 11/13   Cardiac arrest ([place]) 1998   ICD implanted at [facility]   Cardiomyopathy, nonischemic ([place])    LVEF 76-72%   Chronic systolic heart failure ([place])    Essential hypertension    History of colonic polyps    Colonoscopy 11/13   Pneumonia    Severe with respiratory failure 10/13      Past Surgical History:  Procedure Laterality Date   [place]   COLONOSCOPY  09/26/2012   Procedure: COLONOSCOPY;  Surgeon: Beryle Beams, MD;  Location: [place];  Service: Endoscopy;  Laterality: N/A;   Defibrillator system revision  04/12/12   MDT Protecta XT VR implanted at [facility] by Dr Deno Etienne with previously implanted system and leads extracted due to RV lead failure   ESOPHAGOGASTRODUODENOSCOPY  09/25/2012   Procedure: ESOPHAGOGASTRODUODENOSCOPY (EGD);  Surgeon: Beryle Beams, MD;  Location[facility] ENDOSCOPY;  Service: Endoscopy;  Laterality: N/A;    Chief Complaint  Patient presents with   abnormal ct      HPI:    [name] [name]  is a 65 y.o. 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  -Discussed with ID physician Dr. Juleen China who -Recommends blood cultures, TTE -Low threshold for TEE  pending blood culture results -Cefepime and Vancomycin for now pending further culture data -Pulmonology consult requested No fever  Or chills   No Nausea, Vomiting or Diarrhea --No chest pains no palpitations no dizziness no shortness of breath -Sodium is 134, creatinine is 1.36 which is close to recent baseline -Hgb 12.8, platelets 175, INR 1.7 -WBCs 4.8   Review of systems:    In addition to the HPI above,   A full Review of  Systems was done, all other systems reviewed are negative except as noted above in HPI , .    Social History:  Reviewed by me    Social History   Tobacco Use   Smoking status: Former    Packs/day: 0.80    Years: 6.00     Pack years: 4.80    Types: Cigarettes    Start date: 05/13/1978    Quit date: 11/15/1996    Years since quitting: 24.5   Smokeless tobacco: Never  Substance Use Topics   Alcohol use: No    Alcohol/week: 0.0 standard drinks       Family History :  Reviewed by me    Family History  Problem Relation Age of Onset   Diabetes Mother        Died @ 29.   Hypertension Father        Alive @ 65.   Alzheimer's disease Father    Diabetes Brother        Brother also has htn  Hypertension Brother     Home Medications:   Prior to Admission medications   Medication Sig Start Date End Date Taking? Authorizing Provider  carvedilol (COREG) 25 MG tablet TAKE 2 TABLETS (50 MG TOTAL) BY MOUTH 2 (TWO) TIMES DAILY WITH A MEAL. *DOSE INCREASE 05/26/21   Satira Sark, MD  Cholecalciferol (VITAMIN D3) 2000 UNITS capsule Take 2,000 Units by mouth daily.    [provider]  dapagliflozin propanediol (FARXIGA) 10 MG TABS tablet Take 1 tablet (10 mg total) by mouth daily before breakfast. 04/21/21   Satira Sark, MD  ELIQUIS 5 MG TABS tablet TAKE 1 TABLET BY MOUTH TWICE A DAY 12/24/20   Satira Sark, MD  ENTRESTO 24-26 MG TAKE 1 TABLET BY MOUTH TWICE A DAY 04/21/21   Satira Sark, MD  finasteride (PROSCAR) 5 MG tablet TAKE 1 TABLET BY MOUTH EVERY DAY 01/11/21   Irine Seal, MD  furosemide (LASIX) 20 MG tablet Take 1 tablet (20 mg total) by mouth daily. 04/21/21   Satira Sark, MD  polyethylene glycol powder (GLYCOLAX/MIRALAX) powder Take 17 g by mouth daily as needed. For constipation - mix with 8 oz liquid and drink 04/19/12   [provider]  potassium chloride (MICRO-K) 10 MEQ CR capsule Take 1 capsule (10 mEq total) by mouth daily. 02/24/12   de Stanford Scotland, MD  pravastatin (PRAVACHOL) 40 MG tablet Take 40 mg by mouth at bedtime.    [provider]  spironolactone (ALDACTONE) 25 MG tablet TAKE 1/2 TABLET BY MOUTH EVERY DAY 06/09/20   Satira Sark, MD      Allergies:    No Known Allergies   Physical Exam:   Vitals  Blood pressure 108/75, pulse 63, temperature 98 F (36.7 C), temperature source Oral, resp. rate (!) 22, height 6\' 4"  (1.93 m), weight 107 kg, SpO2 98 %.  Physical Examination: General appearance - alert, and in no distress Mental status - alert, oriented to person, place, and time,  Eyes - sclera anicteric Neck - supple, no JVD elevation , Chest -slightly diminished , no wheezing  heart - S1 and S2 normal, irregular, left subclavian area with AICD in situ Abdomen - soft, nontender, nondistended, no masses or organomegaly Neurological - screening mental status exam normal, neck supple without rigidity, cranial nerves II through XII intact, DTR's normal and symmetric Extremities - no pedal edema noted, intact peripheral pulses  Skin - warm, dry    Data Review:    CBC Recent Labs  Lab 05/28/21 1350  WBC 4.8  HGB 12.8*  HCT 39.1  PLT 175  MCV 91.8  MCH 30.0  MCHC 32.7  RDW 14.8  LYMPHSABS 1.0  MONOABS 0.5  EOSABS 0.1  BASOSABS 0.0    Chemistries  Recent Labs  Lab 05/28/21 1350  NA 134*  K 4.6  CL 103  CO2 25  GLUCOSE 98  BUN 21  CREATININE 1.36*  CALCIUM 9.3  AST 10*  ALT 10  ALKPHOS 50  BILITOT 0.7   ------------------------------------------------------------------------------------------------------------------ estimated creatinine clearance is 74.6 mL/min (A) (by C-G formula based on SCr of 1.36 mg/dL (H)). ------------------------------------------------------------------------------------------------------------------ No results for input(s): TSH, T4TOTAL, T3FREE, THYROIDAB in the last 72 hours.  Invalid input(s): FREET3   Coagulation profile Recent Labs  Lab 05/28/21 1350  INR 1.7*   ------------------------------------------------------------------------------------------------------------------- No results for input(s): DDIMER in the last 72  hours. -------------------------------------------------------------------------------------------------------------------  Cardiac Enzymes No results for input(s): CKMB, TROPONINI, MYOGLOBIN in the last 168 hours.  Invalid input(s): CK ------------------------------------------------------------------------------------------------------------------ No results found for: BNP  Urinalysis    Component Value Date/Time   COLORURINE YELLOW 05/28/2021 [place] 05/28/2021 1317   APPEARANCEUR Clear 03/12/2021 1011   LABSPEC 1.008 05/28/2021 1317   PHURINE 6.0 05/28/2021 1317   GLUCOSEU >=500 (A) 05/28/2021 1317   HGBUR MODERATE (A) 05/28/2021 1317   BILIRUBINUR NEGATIVE 05/28/2021 1317   BILIRUBINUR Negative 03/12/2021 [place] 05/28/2021 1317   PROTEINUR NEGATIVE 05/28/2021 1317   UROBILINOGEN 0.2 09/21/2014 1400   NITRITE NEGATIVE 05/28/2021 1317   LEUKOCYTESUR NEGATIVE 05/28/2021 1317    ----------------------------------------------------------------------------------------------------------------   Imaging Results:    DG Chest Port 1 View  Result Date: 05/28/2021 CLINICAL DATA:  Question sepsis EXAM: PORTABLE CHEST 1 VIEW COMPARISON:  Chest x-ray 05/13/2017.  Chest CT 05/25/2021 FINDINGS: Cardiac enlargement. AICD in satisfactory position. Negative for heart failure. No pleural effusion Approximately 15 mm ill-defined nodules in the right lung as noted on CT. IMPRESSION: Stable  lung nodules on the right, nonspecific in appearance. Negative for heart failure or pneumonia. Electronically Signed   By: Franchot Gallo M.D.   On: 05/28/2021 13:54    Radiological Exams on Admission: DG Chest Port 1 View  Result Date: 05/28/2021 CLINICAL DATA:  Question sepsis EXAM: PORTABLE CHEST 1 VIEW COMPARISON:  Chest x-ray 05/13/2017.  Chest CT 05/25/2021 FINDINGS: Cardiac enlargement. AICD in satisfactory position. Negative for heart failure. No pleural effusion  Approximately 15 mm ill-defined nodules in the right lung as noted on CT. IMPRESSION: Stable  lung nodules on the right, nonspecific in appearance. Negative for heart failure or pneumonia. Electronically Signed   By: Franchot Gallo M.D.   On: 05/28/2021 13:54    DVT Prophylaxis -SCD /iv heparin AM Labs Ordered, also please review Full Orders  Family Communication: Admission, patients condition and plan of care including tests being ordered have been discussed with the patient  who indicate understanding and agree with the plan   Code Status - Full Code  Likely DC to  home   Condition  -Stable  Roxan Hockey M.D on 05/28/2021 at 3:33 PM Go to [email or web address] -  for contact info  [facility] - Office  [phone]

## 2021-05-28 NOTE — Progress Notes (Signed)
Pharmacy Antibiotic Note  [name] [name] is a 64 y.o. male admitted on 05/28/2021 with  unknown source of infection .  Pharmacy has been consulted for Vancomycin and Cefepime dosing.  Plan: Vancomycin 2000 mg IV x 1 dose. Vancomycin 1000 mg IV every 12 hours. Cefepime 2000 mg IV every 8 hours. Monitor labs, c/s, and vanco level as indicated.  Height: 6\' 4"  (193 cm) Weight: 107 kg (236 lb) IBW/kg (Calculated) : 86.8  Temp (24hrs), Avg:98.1 F (36.7 C), Min:98 F (36.7 C), Max:98.2 F (36.8 C)  Recent Labs  Lab 05/28/21 1350  WBC 4.8  CREATININE 1.36*    Estimated Creatinine Clearance: 74.6 mL/min (A) (by C-G formula based on SCr of 1.36 mg/dL (H)).    No Known Allergies  Antimicrobials this admission: Vanco 7/14 >>  Cefepime 7/14 >>    Microbiology results: 7/14 BCx: pending 7/14 UCx: pending    Thank you for allowing pharmacy to be a part of this patient's care.  Ramond Craver 05/28/2021 2:34 PM

## 2021-05-28 NOTE — Progress Notes (Addendum)
ANTICOAGULATION CONSULT NOTE - Initial Consult  Pharmacy Consult for heparin Indication: pulmonary embolus  No Known Allergies  Patient Measurements: Height: 6\' 4"  (193 cm) Weight: 107 kg (236 lb) IBW/kg (Calculated) : 86.8 Heparin Dosing Weight: 107 kg  Vital Signs: Temp: 98 F (36.7 C) (07/14 1118) Temp Source: Oral (07/14 1118) BP: 108/75 (07/14 1445) Pulse Rate: 63 (07/14 1445)  Labs: Recent Labs    05/28/21 1350  HGB 12.8*  HCT 39.1  PLT 175  APTT 40*  LABPROT 20.3*  INR 1.7*  CREATININE 1.36*    Estimated Creatinine Clearance: 74.6 mL/min (A) (by C-G formula based on SCr of 1.36 mg/dL (H)).   Medical History: Past Medical History:  Diagnosis Date   Anemia    Atrial fibrillation ([place])    AVM (arteriovenous malformation)    Duodenum - nonbleeding and EGD 11/13   Cardiac arrest ([place]) 1998   ICD implanted at [facility]   Cardiomyopathy, nonischemic (HCC)    LVEF 52-17%   Chronic systolic heart failure ([place])    Essential hypertension    History of colonic polyps    Colonoscopy 11/13   Pneumonia    Severe with respiratory failure 10/13    Medications:  (Not in a hospital admission)   Assessment: Pharmacy consulted to dose heparin in patient with pulmonary embolism.  CT chest from 7/11 shows small areas of PE. Patient is on apixaban prior to admission with last dose 7/14 ~0630.  Baseline HL >1.10 CBC WNL  Goal of Therapy:  Heparin level 0.3-0.7 units/ml aPTT 66-102 seconds Monitor platelets by anticoagulation protocol: Yes   Plan:  Start heparin infusion at 1800 units/hr @ 1830 Check APTT in 6 hours and APTT/anti-Xa level daily. Continue to monitor H&H and platelets.   Margot Ables, PharmD Clinical Pharmacist 05/28/2021 4:18 PM

## 2021-05-29 ENCOUNTER — Encounter (HOSPITAL_COMMUNITY): Payer: Self-pay | Admitting: Family Medicine

## 2021-05-29 ENCOUNTER — Inpatient Hospital Stay (HOSPITAL_COMMUNITY): Payer: Medicare HMO

## 2021-05-29 DIAGNOSIS — I76 Septic arterial embolism: Secondary | ICD-10-CM | POA: Diagnosis not present

## 2021-05-29 DIAGNOSIS — I38 Endocarditis, valve unspecified: Secondary | ICD-10-CM

## 2021-05-29 DIAGNOSIS — I330 Acute and subacute infective endocarditis: Secondary | ICD-10-CM | POA: Diagnosis not present

## 2021-05-29 LAB — ECHOCARDIOGRAM COMPLETE
AR max vel: 5.27 cm2
AV Area VTI: 5.58 cm2
AV Area mean vel: 5.66 cm2
AV Mean grad: 2 mmHg
AV Peak grad: 4.1 mmHg
Ao pk vel: 1.01 m/s
Area-P 1/2: 3.23 cm2
Calc EF: 34.9 %
Height: 76 in
Single Plane A2C EF: 40.9 %
Single Plane A4C EF: 28.3 %
Weight: 3814.4 oz

## 2021-05-29 LAB — D-DIMER, QUANTITATIVE: D-Dimer, Quant: 3.92 ug/mL-FEU — ABNORMAL HIGH (ref 0.00–0.50)

## 2021-05-29 LAB — BASIC METABOLIC PANEL
Anion gap: 7 (ref 5–15)
BUN: 20 mg/dL (ref 8–23)
CO2: 21 mmol/L — ABNORMAL LOW (ref 22–32)
Calcium: 9.3 mg/dL (ref 8.9–10.3)
Chloride: 106 mmol/L (ref 98–111)
Creatinine, Ser: 1.22 mg/dL (ref 0.61–1.24)
GFR, Estimated: >60 mL/min (ref >60)
Glucose, Bld: 116 mg/dL — ABNORMAL HIGH (ref 70–99)
Potassium: 4.1 mmol/L (ref 3.5–5.1)
Sodium: 134 mmol/L — ABNORMAL LOW (ref 135–145)

## 2021-05-29 LAB — CBC
HCT: 37.2 % — ABNORMAL LOW (ref 39.0–52.0)
Hemoglobin: 12.4 g/dL — ABNORMAL LOW (ref 13.0–17.0)
MCH: 29.7 pg (ref 26.0–34.0)
MCHC: 33.3 g/dL (ref 30.0–36.0)
MCV: 89.2 fL (ref 80.0–100.0)
Platelets: 161 10*3/uL (ref 150–400)
RBC: 4.17 MIL/uL — ABNORMAL LOW (ref 4.22–5.81)
RDW: 14.6 % (ref 11.5–15.5)
WBC: 5.3 10*3/uL (ref 4.0–10.5)
nRBC: 0 % (ref 0.0–0.2)

## 2021-05-29 LAB — APTT
aPTT: 90 seconds — ABNORMAL HIGH (ref 24–36)
aPTT: 88 seconds — ABNORMAL HIGH (ref 24–36)

## 2021-05-29 LAB — TSH: TSH: 4.689 u[IU]/mL — ABNORMAL HIGH (ref 0.350–4.500)

## 2021-05-29 LAB — SEDIMENTATION RATE: Sed Rate: 63 mm/hr — ABNORMAL HIGH (ref 0–16)

## 2021-05-29 LAB — T4, FREE: Free T4: 1.18 ng/dL — ABNORMAL HIGH (ref 0.61–1.12)

## 2021-05-29 NOTE — Progress Notes (Signed)
PROGRESS NOTE    [name] [name]  MPN:[id number] DOB: 09/26/1956 DOA: 05/28/2021 PCP: Glenda Chroman, MD     Brief Narrative:  [name] [name] is a 65yo male with past medical history significant for chronic anemia, permanent A. fib, ICD placement status post cardiac arrest, combined systolic and diastolic heart failure, nonischemic cardiomyopathy, hypertension who presented to the emergency department with concerns of abnormal chest CT findings suspicious for multifocal mycotic aneurysms.  New events last 24 hours / Subjective: Patient states that he has been having some cough, shortness of breath.  No complaints of chest pain.  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 fib -On Eliquis PTA -Continue IV heparin, Coreg  CKD stage IIIa -Stable  HLD -Continue pravachol   Elevated TSH -Check free T4     DVT prophylaxis: IV heparin TED hose Start: 05/28/21 1457 SCDs Start: 05/28/21 1456 Place TED hose Start: 05/28/21 1456  Code Status:     Code Status Orders  (From admission, onward)           Start     Ordered   05/28/21 1456  Full code  Continuous         05/28/21 1457           Code Status History     Date Active Date Inactive Code Status Order ID Comments User Context   05/13/2017 0642 05/13/2017 1852 Full Code [id number]  Oswald Hillock, MD Inpatient   09/07/2012 0159 09/29/2012 1438 Full Code [id number]  Fara Olden, RN Inpatient      Family Communication: None at bedside Disposition Plan:  Status is: Inpatient  Remains inpatient appropriate because:IV treatments appropriate due to intensity of illness or inability to take PO  Dispo: The patient is from: Home              Anticipated d/c is to: Home              Patient currently is not medically stable to d/c.   Difficult to place patient No      Consultants:  Pulmonology ID   Procedures:  None   Antimicrobials:  Anti-infectives (From admission, onward)    Start     Dose/Rate Route Frequency Ordered Stop   05/29/21 0400  vancomycin (VANCOCIN) IVPB 1000 mg/200 mL premix        1,000 mg 200 mL/hr over 60 Minutes Intravenous Every 12 hours 05/28/21 1433     05/28/21 1500  vancomycin (VANCOREADY) IVPB 2000 mg/400 mL        2,000 mg 200 mL/hr over 120 Minutes Intravenous  Once 05/28/21 1429 05/28/21 1853   05/28/21 1430  ceFEPIme (MAXIPIME) 2 g in sodium chloride 0.9 % 100 mL IVPB  Status:  Discontinued        2 g 200 mL/hr  over 30 Minutes Intravenous  Once 05/28/21 1424 05/28/21 1428   05/28/21 1430  metroNIDAZOLE (FLAGYL) IVPB 500 mg        500 mg 100 mL/hr over 60 Minutes Intravenous  Once 05/28/21 1424 05/28/21 1627   05/28/21 1430  vancomycin (VANCOCIN) IVPB 1000 mg/200 mL premix  Status:  Discontinued        1,000 mg 200 mL/hr over 60 Minutes Intravenous  Once 05/28/21 1424 05/28/21 1429   05/28/21 1430  ceFEPIme (MAXIPIME) 2 g in sodium chloride 0.9 % 100 mL IVPB        2 g 200 mL/hr over 30 Minutes Intravenous Every 8 hours 05/28/21 1428          Objective: Vitals:   05/28/21 2118 05/29/21 0526 05/29/21 0833 05/29/21 1208  BP: 104/66 102/71  96/64 100/68  Pulse: 76 66 75 94  Resp: 20 18 18 16   Temp:  98.3 F (36.8 C) 98.5 F (36.9 C) 98.2 F (36.8 C)  TempSrc:  Oral Oral Oral  SpO2: 96%  100% 99%  Weight:  108.1 kg    Height:        Intake/Output Summary (Last 24 hours) at 05/29/2021 1229 Last data filed at 05/29/2021 1210 Gross per 24 hour  Intake 1199.1 ml  Output 700 ml  Net 499.1 ml   Filed Weights   05/28/21 1118 05/28/21 1157 05/29/21 0526  Weight: 107 kg 107 kg 108.1 kg    Examination:  General exam: Appears calm and comfortable  Respiratory system: Clear to auscultation. Respiratory effort normal. No respiratory distress. No conversational dyspnea. On room air  Cardiovascular system: S1 & S2 heard. No murmurs. No pedal edema. Gastrointestinal system: Abdomen is nondistended, soft and nontender. Normal bowel sounds heard. Central nervous system: Alert and oriented. No focal neurological deficits. Speech clear.  Extremities: Symmetric in appearance  Skin: No rashes, lesions or ulcers on exposed skin  Psychiatry: Judgement and insight appear normal. Mood & affect appropriate.   Data Reviewed: I have personally reviewed following labs and imaging studies  CBC: Recent Labs  Lab 05/28/21 1350 05/29/21 0142  WBC 4.8 5.3  NEUTROABS 3.2  --   HGB 12.8* 12.4*  HCT 39.1 37.2*  MCV 91.8 89.2  PLT 175 397   Basic Metabolic Panel: Recent Labs  Lab 05/28/21 1350 05/29/21 0201  NA 134* 134*  K 4.6 4.1  CL 103 106  CO2 25 21*  GLUCOSE 98 116*  BUN 21 20  CREATININE 1.36* 1.22  CALCIUM 9.3 9.3   GFR: Estimated Creatinine Clearance: 83.5 mL/min (by C-G formula based on SCr of 1.22 mg/dL). Liver Function Tests: Recent Labs  Lab 05/28/21 1350  AST 10*  ALT 10  ALKPHOS 50  BILITOT 0.7  PROT 8.0  ALBUMIN 3.6   No results for input(s): LIPASE, AMYLASE in the last 168 hours. No results for input(s): AMMONIA in the last 168 hours. Coagulation Profile: Recent Labs  Lab 05/28/21 1350  INR 1.7*    Cardiac Enzymes: No results for input(s): CKTOTAL, CKMB, CKMBINDEX, TROPONINI in the last 168 hours. BNP (last 3 results) No results for input(s): PROBNP in the last 8760 hours. HbA1C: No results for input(s): HGBA1C in the last 72 hours. CBG: No results for input(s): GLUCAP in the last 168 hours. Lipid Profile: No results for input(s): CHOL, HDL, LDLCALC, TRIG, CHOLHDL, LDLDIRECT in the last 72 hours. Thyroid Function Tests: Recent Labs    05/29/21 0201  TSH 4.689*   Anemia Panel: No  results for input(s): VITAMINB12, FOLATE, FERRITIN, TIBC, IRON, RETICCTPCT in the last 72 hours. Sepsis Labs: Recent Labs  Lab 05/28/21 1339  LATICACIDVEN 1.4    Recent Results (from the past 240 hour(s))  Blood Culture (routine x 2)     Status: None (Preliminary result)   Collection Time: 05/28/21  1:39 PM   Specimen: Right Antecubital; Blood  Result Value Ref Range Status   Specimen Description   Final    RIGHT ANTECUBITAL BOTTLES DRAWN AEROBIC AND ANAEROBIC   Special Requests   Final    Blood Culture adequate volume Performed at [facility], [address] [place], [place] [address]    Culture PENDING  Incomplete   Report Status PENDING  Incomplete  Blood Culture (routine x 2)     Status: None (Preliminary result)   Collection Time: 05/28/21  1:39 PM   Specimen: Left Antecubital; Blood  Result Value Ref Range Status   Specimen Description   Final    LEFT ANTECUBITAL BOTTLES DRAWN AEROBIC AND ANAEROBIC   Special Requests   Final    Blood Culture adequate volume Performed at [facility], [address], [place], [place] [address]    Culture PENDING  Incomplete   Report Status PENDING  Incomplete  Resp Panel by RT-PCR (Flu A&B, Covid) Nasopharyngeal Swab     Status: None   Collection Time: 05/28/21  1:49 PM   Specimen: Nasopharyngeal Swab; Nasopharyngeal(NP) swabs in vial transport medium  Result Value Ref Range Status   SARS Coronavirus 2 by RT PCR NEGATIVE NEGATIVE Final     Comment: (NOTE) SARS-CoV-2 target nucleic acids are NOT DETECTED.  The SARS-CoV-2 RNA is generally detectable in upper respiratory specimens during the acute phase of infection. The lowest concentration of SARS-CoV-2 viral copies this assay can detect is 138 copies/mL. A negative result does not preclude SARS-Cov-2 infection and should not be used as the sole basis for treatment or other patient management decisions. A negative result may occur with  improper specimen collection/handling, submission of specimen other than nasopharyngeal swab, presence of viral mutation(s) within the areas targeted by this assay, and inadequate number of viral copies(<138 copies/mL). A negative result must be combined with clinical observations, patient history, and epidemiological information. The expected result is Negative.  Fact Sheet for Patients:  EntrepreneurPulse.com.au  Fact Sheet for Healthcare Providers:  IncredibleEmployment.be  This test is no t yet approved or cleared by the [place] FDA and  has been authorized for detection and/or diagnosis of SARS-CoV-2 by FDA under an Emergency Use Authorization (EUA). This EUA will remain  in effect (meaning this test can be used) for the duration of the COVID-19 declaration under Section 564(b)(1) of the Act, 21 U.S.C.section 360bbb-3(b)(1), unless the authorization is terminated  or revoked sooner.       Influenza A by PCR NEGATIVE NEGATIVE Final   Influenza B by PCR NEGATIVE NEGATIVE Final    Comment: (NOTE) The Xpert Xpress SARS-CoV-2/FLU/RSV plus assay is intended as an aid in the diagnosis of influenza from Nasopharyngeal swab specimens and should not be used as a sole basis for treatment. Nasal washings and aspirates are unacceptable for Xpert Xpress SARS-CoV-2/FLU/RSV testing.  Fact Sheet for Patients: EntrepreneurPulse.com.au  Fact Sheet for Healthcare  Providers: IncredibleEmployment.be  This test is not yet approved or cleared by the [place] FDA and has been authorized for detection and/or diagnosis of SARS-CoV-2 by FDA under an Emergency Use Authorization (EUA). This EUA will remain in effect (meaning this test  can be used) for the duration of the COVID-19 declaration under Section 564(b)(1) of the Act, 21 U.S.C. section 360bbb-3(b)(1), unless the authorization is terminated or revoked.  Performed at [facility], [address], [place], [place] [address]       Radiology Studies: US Venous Img Lower Bilateral (DVT)  Result Date: 05/28/2021 CLINICAL DATA:  History of septic embolism. Abnormal CT. History of pulmonary embolism. Evaluate for DVT. EXAM: BILATERAL LOWER EXTREMITY VENOUS DOPPLER ULTRASOUND TECHNIQUE: Gray-scale sonography with graded compression, as well as color Doppler and duplex ultrasound were performed to evaluate the lower extremity deep venous systems from the level of the common femoral vein and including the common femoral, femoral, profunda femoral, popliteal and calf veins including the posterior tibial, peroneal and gastrocnemius veins when visible. The superficial great saphenous vein was also interrogated. Spectral Doppler was utilized to evaluate flow at rest and with distal augmentation maneuvers in the common femoral, femoral and popliteal veins. COMPARISON:  None. FINDINGS: RIGHT LOWER EXTREMITY Common Femoral Vein: No evidence of thrombus. Normal compressibility, respiratory phasicity and response to augmentation. Saphenofemoral Junction: No evidence of thrombus. Normal compressibility and flow on color Doppler imaging. Profunda Femoral Vein: No evidence of thrombus. Normal compressibility and flow on color Doppler imaging. Femoral Vein: No evidence of thrombus. Normal compressibility, respiratory phasicity and response to augmentation. Popliteal Vein: No evidence of thrombus. Normal  compressibility, respiratory phasicity and response to augmentation. Calf Veins: No evidence of thrombus. Normal compressibility and flow on color Doppler imaging. Superficial Great Saphenous Vein: No evidence of thrombus. Normal compressibility. Venous Reflux:  None. Other Findings:  None. LEFT LOWER EXTREMITY Common Femoral Vein: No evidence of thrombus. Normal compressibility, respiratory phasicity and response to augmentation. Saphenofemoral Junction: No evidence of thrombus. Normal compressibility and flow on color Doppler imaging. Profunda Femoral Vein: No evidence of thrombus. Normal compressibility and flow on color Doppler imaging. Femoral Vein: No evidence of thrombus. Normal compressibility, respiratory phasicity and response to augmentation. Popliteal Vein: No evidence of thrombus. Normal compressibility, respiratory phasicity and response to augmentation. Calf Veins: No evidence of thrombus. Normal compressibility and flow on color Doppler imaging. Superficial Great Saphenous Vein: No evidence of thrombus. Normal compressibility. Venous Reflux:  None. Other Findings:  None. IMPRESSION: No evidence of DVT within either lower extremity. Electronically Signed   By: Sandi Mariscal M.D.   On: 05/28/2021 17:36   DG Chest Port 1 View  Result Date: 05/28/2021 CLINICAL DATA:  Question sepsis EXAM: PORTABLE CHEST 1 VIEW COMPARISON:  Chest x-ray 05/13/2017.  Chest CT 05/25/2021 FINDINGS: Cardiac enlargement. AICD in satisfactory position. Negative for heart failure. No pleural effusion Approximately 15 mm ill-defined nodules in the right lung as noted on CT. IMPRESSION: Stable  lung nodules on the right, nonspecific in appearance. Negative for heart failure or pneumonia. Electronically Signed   By: Franchot Gallo M.D.   On: 05/28/2021 13:54      Scheduled Meds:  carvedilol  3.125 mg Oral BID WC   cholecalciferol  2,000 Units Oral Daily   finasteride  5 mg Oral Daily   furosemide  20 mg Oral Daily    multivitamin with minerals  1 tablet Oral Daily   potassium chloride  10 mEq Oral Daily   pravastatin  40 mg Oral QHS   sacubitril-valsartan  1 tablet Oral BID   sodium chloride flush  3 mL Intravenous Q12H   sodium chloride flush  3 mL Intravenous Q12H   spironolactone  12.5 mg Oral Daily   Continuous  Infusions:  sodium chloride     ceFEPime (MAXIPIME) IV 2 g (05/29/21 0534)   heparin 1,800 Units/hr (05/29/21 1121)   vancomycin Stopped (05/29/21 0518)     LOS: 1 day      Time spent: 25 minutes   Dessa Phi, DO [facility] 05/29/2021, 12:29 PM   Available via Epic secure chat 7am-7pm After these hours, please refer to coverage provider listed on [email or web address]

## 2021-05-29 NOTE — Progress Notes (Signed)
*  PRELIMINARY RESULTS* Echocardiogram 2D Echocardiogram has been performed.  Luisa Hart RDCS 05/29/2021, 10:34 AM

## 2021-05-29 NOTE — Progress Notes (Signed)
ANTICOAGULATION CONSULT NOTE  Pharmacy Consult for heparin Indication: pulmonary embolus  No Known Allergies  Patient Measurements: Height: 6\' 4"  (193 cm) Weight: 108.1 kg (238 lb 6.4 oz) IBW/kg (Calculated) : 86.8 Heparin Dosing Weight: 107 kg  Vital Signs: Temp: 98.5 F (36.9 C) (07/15 0833) Temp Source: Oral (07/15 0833) BP: 96/64 (07/15 0833) Pulse Rate: 75 (07/15 0833)  Labs: Recent Labs    05/28/21 1350 05/28/21 1634 05/29/21 0100 05/29/21 0142 05/29/21 0201 05/29/21 0943  HGB 12.8*  --   --  12.4*  --   --   HCT 39.1  --   --  37.2*  --   --   PLT 175  --   --  161  --   --   APTT 40*  --  90*  --   --  88*  LABPROT 20.3*  --   --   --   --   --   INR 1.7*  --   --   --   --   --   HEPARINUNFRC  --  >1.10*  --   --   --   --   CREATININE 1.36*  --   --   --  1.22  --      Estimated Creatinine Clearance: 83.5 mL/min (by C-G formula based on SCr of 1.22 mg/dL).   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 *DOSE INCREASE (Patient taking differently: Take 50 mg by mouth 2 (two) times daily with a meal.) 90 tablet 3 05/28/2021 at 0630   Cholecalciferol (VITAMIN D3) 2000 UNITS capsule Take 2,000 Units by mouth daily.   05/28/2021   dapagliflozin propanediol (FARXIGA) 10 MG TABS tablet Take 1 tablet (10 mg total) by mouth daily before breakfast. 30 tablet 6 05/28/2021    ELIQUIS 5 MG TABS tablet TAKE 1 TABLET BY MOUTH TWICE A DAY 60 tablet 6 05/28/2021 at 0630   ENTRESTO 24-26 MG TAKE 1 TABLET BY MOUTH TWICE A DAY 60 tablet 6 05/28/2021   finasteride (PROSCAR) 5 MG tablet TAKE 1 TABLET BY MOUTH EVERY DAY 90 tablet 3 05/28/2021   furosemide (LASIX) 20 MG tablet Take 1 tablet (20 mg total) by mouth daily. 90 tablet 3 05/28/2021   polyethylene glycol powder (GLYCOLAX/MIRALAX) powder Take 17 g by mouth daily as needed. For constipation - mix with 8 oz liquid and drink      potassium chloride (MICRO-K) 10 MEQ CR capsule Take 1 capsule (10 mEq total) by mouth daily. 90 capsule 3 05/28/2021   pravastatin (PRAVACHOL) 40 MG tablet Take 40 mg by mouth daily.   05/28/2021   spironolactone (ALDACTONE) 25 MG tablet TAKE 1/2 TABLET BY MOUTH EVERY DAY 45 tablet 3 05/28/2021    Assessment: Pharmacy consulted to dose heparin in patient with pulmonary embolism.  CT chest from 7/11 shows small areas of PE. Patient is on apixaban prior to admission with last dose 7/14 ~0630. -aptt confirmed at goal   Goal of Therapy:  Heparin level 0.3-0.7 units/ml aPTT 66-102 seconds Monitor platelets by anticoagulation protocol: Yes   Plan:  Continue heparin infusion at 1800 units/hr  Daily heparin level, aPTT and CBC  Hildred Laser, PharmD Clinical Pharmacist **Pharmacist phone directory can now be found on [email or web address] ([email or web address]).  Listed under [place].

## 2021-05-29 NOTE — Progress Notes (Signed)
ANTICOAGULATION CONSULT NOTE - Follow Up Consult  Pharmacy Consult for heparin Indication: atrial fibrillation and pulmonary embolus   Labs: Recent Labs    05/28/21 1350 05/28/21 1634 05/29/21 0142  HGB 12.8*  --  12.4*  HCT 39.1  --  37.2*  PLT 175  --  161  APTT 40*  --   --   LABPROT 20.3*  --   --   INR 1.7*  --   --   HEPARINUNFRC  --  >1.10*  --   CREATININE 1.36*  --   --     Assessment/Plan:  65yo male therapeutic on heparin with initial dosing for PE despite Eliquis use for Afib (PTT 90, not crossed over to Epic). Will continue gtt at current rate of 1800 units/hr and confirm stable with additional PTT.   Wynona Neat, PharmD, BCPS  05/29/2021,3:04 AM

## 2021-05-29 NOTE — Consult Note (Signed)
[facility]                                                                                        Patient Identification: Patient Name: [name] [name] MRN: [id number] Admit Date: 05/28/2021 11:03 AM Today's Date: 05/29/2021 Reason for consult: concern for endocarditis with pulmonary septic emboli Requesting provider: Noralee Stain  Principal Problem:   Possible Endocarditis- w/u in Progress Active Problems:   Atrial fibrillation (HCC)   Chronic systolic heart failure (HCC)   Implantable cardioverter-defibrillator (ICD) in situ   Cardiomyopathy, nonischemic (HCC)   Essential hypertension, benign   Chronic anticoagulation   Pulmonary embolism (HCC)   Antibiotics: Vancomycin/cefepime/metronidazole 7/14 -current   Lines/Tubes: PIV  Assessment 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 Rt UL nodule ? Concern for septic emboli Small areas of PE H/o AVM in the past  H/o NICM with CHF s/p AICD New onset hemoptysis - in the setting of AC Ex smoker   Recommendations  Continue Vancomycin and cefepime as is Follow up repeat blood cultures  Fu ANA, ANCA, ESR and D dimer as recommeded by Pulmonary  TTE is negative for vegetations, will need a TEE Anticoagulation for PE per primary  Monitor CBC, BMP and Vancomycin trough   Dr Daiva Eves will follow over the weekend.   Rest of the management as per the primary team. Please call with questions or concerns.  Thank you for the consult  Odette Fraction, MD Infectious Disease Physician [facility] [address] [place], [place] [address] Phone: [phone]  Fax: [phone]  __________________________________________________________________________________________________________ HPI and Hospital Course: 65 Y O male with PMH of atrial fibrillation, history of cardiac  arrest status post ICD, nonischemic cardiomyopathy with systolic CHF, hypertension, anemia, AVM who was transferred any [facility] ED for ID and pulmonary evaluation in the setting of outpatient CT chest done which showed findings concerning for multiple mycotic aneurysms within the pulmonary arterial bed as well as small areas of pulmonary embolism with concerns of endocarditis. Patient was following with Urology OP for work up of elevated PSA and microhematuria for which CT was done 04/12/21 with  " acute uncomplicated appendicitis/Numerous bilateral solid pulmonary nodules in the bilateral lung bases, measuring up to 2.4 cm, possibility for granulomatous diseases like carcinoid for which CT chest was recommended. Mottled heterogeneous appearance of the hepatic parenchyma, with homogeneous appearance of the parenchyma and no discrete hepatic lesions seen on delayed imaging, nonspecific but in the setting of patient's four-chamber cardiomegaly and pericardial effusion findings are most consistent with congestive hepatopathy/ Splenomegaly with probable sequela prior splenic trauma"   This was followed by a CT chest with findings as below.   At ED, he was afebrile, no leukocytosis, blood cultures are pending.  TTE is pending.  Labs are remarkable for AKI with creatinine of 1.36 WBC 4.8  Patient tells me he is having cough with occasional phelgm production for last 1 month or so. Denies any fevers, chills. He is recently sweating at night but thinks it is probably related to the high temperature. Denies any chest  pain or SOB. He feels he was in his normal state of health before CT was done. Appetite is good, thinks he might have lost 5-10 lb, unsure time line. Denies any GU symptoms. Denies joint pain, back pain and rashes.  Ex smoker, quit almost 30 years ago, denies alcohol and illicit drug use. Has seasonal allergies. Lives with his 2 brothers. Denies pets at home. He has started hemoptysis after being  started on [facility] at the hospital.   ROS: 12 point ROS done with pertinent positives and negatives listed above  Past Medical History:  Diagnosis Date   Anemia    Atrial fibrillation ([place])    AVM (arteriovenous malformation)    Duodenum - nonbleeding and EGD 11/13   Cardiac arrest ([place]) 1998   ICD implanted at [facility]   Cardiomyopathy, nonischemic ([place])    LVEF 22-44%   Chronic systolic heart failure ([place])    Essential hypertension    History of colonic polyps    Colonoscopy 11/13   Pneumonia    Severe with respiratory failure 10/13   Past Surgical History:  Procedure Laterality Date   [place]   COLONOSCOPY  09/26/2012   Procedure: COLONOSCOPY;  Surgeon: Beryle Beams, MD;  Location: [place];  Service: Endoscopy;  Laterality: N/A;   Defibrillator system revision  04/12/12   MDT Protecta XT VR implanted at [facility] by Dr Deno Etienne with previously implanted system and leads extracted due to RV lead failure   ESOPHAGOGASTRODUODENOSCOPY  09/25/2012   Procedure: ESOPHAGOGASTRODUODENOSCOPY (EGD);  Surgeon: Beryle Beams, MD;  Location: [facility] ENDOSCOPY;  Service: Endoscopy;  Laterality: N/A;     Scheduled Meds:  carvedilol  3.125 mg Oral BID WC   cholecalciferol  2,000 Units Oral Daily   finasteride  5 mg Oral Daily   furosemide  20 mg Oral Daily   multivitamin with minerals  1 tablet Oral Daily   potassium chloride  10 mEq Oral Daily   pravastatin  40 mg Oral QHS   sacubitril-valsartan  1 tablet Oral BID   sodium chloride flush  3 mL Intravenous Q12H   sodium chloride flush  3 mL Intravenous Q12H   spironolactone  12.5 mg Oral Daily   Continuous Infusions:  sodium chloride     ceFEPime (MAXIPIME) IV 2 g (05/29/21 0534)   heparin 1,800 Units/hr (05/29/21 0536)   vancomycin 1,000 mg (05/29/21 0408)   PRN Meds:.sodium chloride, acetaminophen **OR** acetaminophen, bisacodyl, ondansetron **OR** ondansetron (ZOFRAN) IV, polyethylene glycol, sodium  chloride flush  No Known Allergies  Social History   Socioeconomic History   Marital status: Single    Spouse name: Not on file   Number of children: Not on file   Years of education: Not on file   Highest education level: Not on file  Occupational History   Occupation: DIABLED  Tobacco Use   Smoking status: Former    Packs/day: 0.80    Years: 6.00    Pack years: 4.80    Types: Cigarettes    Start date: 05/13/1978    Quit date: 11/15/1996    Years since quitting: 24.5   Smokeless tobacco: Never  Vaping Use   Vaping Use: Never used  Substance and Sexual Activity   Alcohol use: No    Alcohol/week: 0.0 standard drinks   Drug use: Not Currently    Types: Marijuana    Comment: Many years ago   Sexual activity: Never  Other Topics Concern   Not on file  Social History Narrative   Lives in [place] with his father and 2 brothers.   Social Determinants of Health   Financial Resource Strain: Not on file  Food Insecurity: Not on file  Transportation Needs: Not on file  Physical Activity: Not on file  Stress: Not on file  Social Connections: Not on file  Intimate Partner Violence: Not on file   Vitals BP 102/71 (BP Location: Left Arm)   Pulse 66   Temp 98.3 F (36.8 C) (Oral)   Resp 18   Ht $R'6\' 4"'DG$  (1.93 m)   Wt 108.1 kg   SpO2 96%   BMI 29.02 kg/m    Physical Exam Constitutional:  he is coughing up blood in the setting of AC use    Comments:   Cardiovascular:     Rate and Rhythm: Normal rate and irregular rhythm.     Heart sounds:   Pulmonary:     Effort: Pulmonary effort is normal.     Comments:   Abdominal:     Palpations: Abdomen is soft.     Tenderness: Non tender and non distended   Musculoskeletal:        General: No swelling or tenderness.   Skin:    Comments: No lesions or rashes, no peripheral stigmata of endocarditis  Neurological:     General: No focal deficit present.   Psychiatric:        Mood and Affect: Mood normal.    Pertinent Microbiology Results for orders placed or performed during the hospital encounter of 05/28/21  Blood Culture (routine x 2)     Status: None (Preliminary result)   Collection Time: 05/28/21  1:39 PM   Specimen: Right Antecubital; Blood  Result Value Ref Range Status   Specimen Description   Final    RIGHT ANTECUBITAL BOTTLES DRAWN AEROBIC AND ANAEROBIC   Special Requests   Final    Blood Culture adequate volume Performed at [facility], [address] [place], [place] [address]    Culture PENDING  Incomplete   Report Status PENDING  Incomplete  Blood Culture (routine x 2)     Status: None (Preliminary result)   Collection Time: 05/28/21  1:39 PM   Specimen: Left Antecubital; Blood  Result Value Ref Range Status   Specimen Description   Final    LEFT ANTECUBITAL BOTTLES DRAWN AEROBIC AND ANAEROBIC   Special Requests   Final    Blood Culture adequate volume Performed at [facility], [address], [place], [place] [address]    Culture PENDING  Incomplete   Report Status PENDING  Incomplete  Resp Panel by RT-PCR (Flu A&B, Covid) Nasopharyngeal Swab     Status: None   Collection Time: 05/28/21  1:49 PM   Specimen: Nasopharyngeal Swab; Nasopharyngeal(NP) swabs in vial transport medium  Result Value Ref Range Status   SARS Coronavirus 2 by RT PCR NEGATIVE NEGATIVE Final    Comment: (NOTE) SARS-CoV-2 target nucleic acids are NOT DETECTED.  The SARS-CoV-2 RNA is generally detectable in upper respiratory specimens during the acute phase of infection. The lowest concentration of SARS-CoV-2 viral copies this assay can detect is 138 copies/mL. A negative result does not preclude SARS-Cov-2 infection and should not be used as the sole basis for treatment or other patient management decisions. A negative result may occur with  improper specimen collection/handling, submission of specimen other than nasopharyngeal swab, presence of viral mutation(s) within the areas targeted  by this assay, and inadequate number of viral copies(<138  copies/mL). A negative result must be combined with clinical observations, patient history, and epidemiological information. The expected result is Negative.  Fact Sheet for Patients:  EntrepreneurPulse.com.au  Fact Sheet for Healthcare Providers:  IncredibleEmployment.be  This test is no t yet approved or cleared by the [place] FDA and  has been authorized for detection and/or diagnosis of SARS-CoV-2 by FDA under an Emergency Use Authorization (EUA). This EUA will remain  in effect (meaning this test can be used) for the duration of the COVID-19 declaration under Section 564(b)(1) of the Act, 21 U.S.C.section 360bbb-3(b)(1), unless the authorization is terminated  or revoked sooner.       Influenza A by PCR NEGATIVE NEGATIVE Final   Influenza B by PCR NEGATIVE NEGATIVE Final    Comment: (NOTE) The Xpert Xpress SARS-CoV-2/FLU/RSV plus assay is intended as an aid in the diagnosis of influenza from Nasopharyngeal swab specimens and should not be used as a sole basis for treatment. Nasal washings and aspirates are unacceptable for Xpert Xpress SARS-CoV-2/FLU/RSV testing.  Fact Sheet for Patients: EntrepreneurPulse.com.au  Fact Sheet for Healthcare Providers: IncredibleEmployment.be  This test is not yet approved or cleared by the [place] FDA and has been authorized for detection and/or diagnosis of SARS-CoV-2 by FDA under an Emergency Use Authorization (EUA). This EUA will remain in effect (meaning this test can be used) for the duration of the COVID-19 declaration under Section 564(b)(1) of the Act, 21 U.S.C. section 360bbb-3(b)(1), unless the authorization is terminated or revoked.  Performed at [facility], [address], [place], [place] [address]       Pertinent Lab seen by me: CBC Latest Ref Rng & Units 05/29/2021 05/28/2021  05/13/2017  WBC 4.0 - 10.5 K/uL 5.3 4.8 7.8  Hemoglobin 13.0 - 17.0 g/dL 12.4(L) 12.8(L) 12.8(L)  Hematocrit 39.0 - 52.0 % 37.2(L) 39.1 35.9(L)  Platelets 150 - 400 K/uL 161 175 186    CMP Latest Ref Rng & Units 05/29/2021 05/28/2021 04/09/2021  Glucose 70 - 99 mg/dL 116(H) 98 -  BUN 8 - 23 mg/dL 20 21 -  Creatinine 0.61 - 1.24 mg/dL 1.22 1.36(H) 1.30(H)  Sodium 135 - 145 mmol/L 134(L) 134(L) -  Potassium 3.5 - 5.1 mmol/L 4.1 4.6 -  Chloride 98 - 111 mmol/L 106 103 -  CO2 22 - 32 mmol/L 21(L) 25 -  Calcium 8.9 - 10.3 mg/dL 9.3 9.3 -  Total Protein 6.5 - 8.1 g/dL - 8.0 -  Total Bilirubin 0.3 - 1.2 mg/dL - 0.7 -  Alkaline Phos 38 - 126 U/L - 50 -  AST 15 - 41 U/L - 10(L) -  ALT 0 - 44 U/L - 10 -     Pertinent Imagings/Other Imagings Plain films and CT images have been personally visualized and interpreted; radiology reports have been reviewed. Decision making incorporated into the Impression / Recommendations.  CT chest 05/25/21 IMPRESSION: 1. Multiple sites of ectasia/aneurysmal dilation of the segmental and subsegmental branches of the pulmonary artery mainly at the subsegmental level. Findings may be due to chronic and recurrent thromboembolic phenomenon or could be related to atypical infection/septic emboli. Blood culture correlation may be helpful. 2. Superimposed parenchymal nodules are nonspecific, would favor sequela of atypical infection and even fungal pneumonia. Neoplasm is however not entirely excluded. Would also correlate with any recent history of COVID-19 infection. Pulmonary consultation for above findings is suggested. 3. Mild nodal enlargement nonspecific in a patient with cardiac disease, potentially reactive. Consider short interval follow-up for further evaluation. 4. Scattered  smaller nodules elsewhere in the chest in the upper lobes predominantly in the upper lobes. 5. Chronic interstitial changes at the RIGHT lung base. 6. Small pericardial effusion. 7.  Question cardiogenic cirrhosis based on appearance of the liver in the setting of cardiac dysfunction. 8. Aortic atherosclerosis.   Aortic Atherosclerosis (ICD10-I70.0).   ADDENDUM: For clarification: Constellation of findings is most suspicious for multifocal mycotic aneurysms within the pulmonary arterial bed.   There are small areas of PE as well as described in the initial report.   The nodule in the RIGHT upper lobe just above the minor fissure with indistinct margins is likely new since the prior study which supports an infectious etiology as a unifying diagnosis. Would suggest attention on follow-up however to ensure resolution.  TTE 05/29/21 Left ventricular ejection fraction, by estimation, is 35 to 40%. The left ventricle has moderately decreased function. The left ventricle demonstrates global hypokinesis. Left ventricular diastolic function could not be evaluated. There is the interventricular septum is flattened in systole and diastole, consistent with right ventricular pressure and volume overload. 2. Right ventricular systolic function is mildly reduced. The right ventricular size is moderately enlarged. There is severely elevated pulmonary artery systolic pressure. 3. Left atrial size was mild to moderately dilated. 4. Right atrial size was severely dilated. 5. Moderate pericardial effusion. The pericardial effusion is circumferential. There is no evidence of cardiac tamponade. 6. The mitral valve is normal in structure. Trivial mitral valve regurgitation. 7. The tricuspid valve is abnormal. Tricuspid valve regurgitation is severe. 8. The aortic valve is tricuspid. Aortic valve regurgitation is not visualized. 9. The inferior vena cava is dilated in size with <50% respiratory variability, suggesting right atrial pressure of 15 mmHg  I spent more than 70  minutes for this patient encounter including review of prior medical records/discussing diagnostics and  treatment plan with the patient/family/coordinate care with primary/other specialits with greater than 50% of time in face to face encounter.   Electronically signed by:   Rosiland Oz, MD Infectious Disease Physician [facility] Pager: [phone]

## 2021-05-30 DIAGNOSIS — I330 Acute and subacute infective endocarditis: Secondary | ICD-10-CM | POA: Diagnosis not present

## 2021-05-30 DIAGNOSIS — I76 Septic arterial embolism: Secondary | ICD-10-CM

## 2021-05-30 DIAGNOSIS — I5022 Chronic systolic (congestive) heart failure: Secondary | ICD-10-CM

## 2021-05-30 DIAGNOSIS — I428 Other cardiomyopathies: Secondary | ICD-10-CM | POA: Diagnosis not present

## 2021-05-30 DIAGNOSIS — I4811 Longstanding persistent atrial fibrillation: Secondary | ICD-10-CM | POA: Diagnosis not present

## 2021-05-30 DIAGNOSIS — I38 Endocarditis, valve unspecified: Secondary | ICD-10-CM

## 2021-05-30 DIAGNOSIS — I729 Aneurysm of unspecified site: Secondary | ICD-10-CM

## 2021-05-30 DIAGNOSIS — Z95810 Presence of automatic (implantable) cardiac defibrillator: Secondary | ICD-10-CM

## 2021-05-30 DIAGNOSIS — Z7901 Long term (current) use of anticoagulants: Secondary | ICD-10-CM

## 2021-05-30 LAB — CBC
HCT: 39.3 % (ref 39.0–52.0)
Hemoglobin: 13.3 g/dL (ref 13.0–17.0)
MCH: 29.8 pg (ref 26.0–34.0)
MCHC: 33.8 g/dL (ref 30.0–36.0)
MCV: 87.9 fL (ref 80.0–100.0)
Platelets: 189 10*3/uL (ref 150–400)
RBC: 4.47 MIL/uL (ref 4.22–5.81)
RDW: 14.5 % (ref 11.5–15.5)
WBC: 4 10*3/uL (ref 4.0–10.5)
nRBC: 0 % (ref 0.0–0.2)

## 2021-05-30 LAB — URINE CULTURE: Culture: <10000 — AB

## 2021-05-30 LAB — HEPARIN LEVEL (UNFRACTIONATED): Heparin Unfractionated: 1.03 IU/mL — ABNORMAL HIGH (ref 0.30–0.70)

## 2021-05-30 LAB — APTT: aPTT: 66 seconds — ABNORMAL HIGH (ref 24–36)

## 2021-05-30 NOTE — Progress Notes (Signed)
PROGRESS NOTE    [name] [name]  CBJ:[id number] DOB: Jun 22, 1956 DOA: 05/28/2021 PCP: Glenda Chroman, MD     Brief Narrative:  [name] [name] is a 65yo male with past medical history significant for chronic anemia, permanent A. fib, ICD placement status post cardiac arrest, combined systolic and diastolic heart failure, nonischemic cardiomyopathy, hypertension who presented to the emergency department with concerns of abnormal chest CT findings suspicious for multifocal mycotic aneurysms.  New events last 24 hours / Subjective: Patient without any acute complaints this morning.  Has some dry cough but denies any chest pain or shortness of breath.  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  No evidence of valvular vegetation seen -Sent message for cardiology to arrange for TEE early next week -Blood cultures pending -Continue vancomycin, cefepime  Scattered subsegmental PE -Continue IV heparin -Venous doppler neg for DVT  -Remains on room air   Nonischemic cardiomyopathy, with chronic combined systolic and diastolic heart failure, status post ICD -Continue Coreg, Lasix, Entresto, Aldactone  Permanent A. fib -On Eliquis PTA -Continue IV heparin, Coreg  CKD stage IIIa -Stable  HLD -Continue pravachol   Elevated TSH -Free T4 elevated 1.18 -Repeat TSH in 4 to 6 weeks    DVT prophylaxis: IV heparin TED hose Start:  05/28/21 1457 SCDs Start: 05/28/21 1456 Place TED hose Start: 05/28/21 1456  Code Status:     Code Status Orders  (From admission, onward)           Start     Ordered   05/28/21 1456  Full code  Continuous        05/28/21 1457           Code Status History     Date Active Date Inactive Code Status Order ID Comments User Context   05/13/2017 0642 05/13/2017 1852 Full Code [id number]  Oswald Hillock, MD Inpatient   09/07/2012 0159 09/29/2012 1438 Full Code [id number]  Fara Olden, RN Inpatient      Family Communication: None at bedside Disposition Plan:  Status is: Inpatient  Remains inpatient appropriate because:IV treatments appropriate due to intensity of illness or inability to take PO  Dispo: The patient is from: Home              Anticipated d/c is to: Home              Patient currently is not medically stable to d/c.  Remains on IV antibiotics.  Blood cultures pending.  He will need TEE next week.   Difficult to place patient No      Consultants:  Pulmonology ID   Procedures:  None   Antimicrobials:  Anti-infectives (From admission, onward)    Start     Dose/Rate Route Frequency Ordered Stop   05/29/21 0400  vancomycin (VANCOCIN) IVPB 1000 mg/200 mL premix        1,000 mg 200 mL/hr over 60 Minutes Intravenous Every 12 hours 05/28/21 1433     05/28/21 1500  vancomycin (VANCOREADY)  IVPB 2000 mg/400 mL        2,000 mg 200 mL/hr over 120 Minutes Intravenous  Once 05/28/21 1429 05/28/21 1853   05/28/21 1430  ceFEPIme (MAXIPIME) 2 g in sodium chloride 0.9 % 100 mL IVPB  Status:  Discontinued        2 g 200 mL/hr over 30 Minutes Intravenous  Once 05/28/21 1424 05/28/21 1428   05/28/21 1430  metroNIDAZOLE (FLAGYL) IVPB 500 mg        500 mg 100 mL/hr over 60 Minutes Intravenous  Once 05/28/21 1424 05/28/21 1627   05/28/21 1430  vancomycin (VANCOCIN) IVPB 1000 mg/200 mL premix  Status:  Discontinued        1,000 mg 200 mL/hr over 60 Minutes Intravenous   Once 05/28/21 1424 05/28/21 1429   05/28/21 1430  ceFEPIme (MAXIPIME) 2 g in sodium chloride 0.9 % 100 mL IVPB        2 g 200 mL/hr over 30 Minutes Intravenous Every 8 hours 05/28/21 1428          Objective: Vitals:   05/29/21 2059 05/29/21 2100 05/29/21 2326 05/30/21 0502  BP:  110/79 108/72 109/73  Pulse:  85 88 89  Resp:   16 17  Temp: 98.1 F (36.7 C)  98.5 F (36.9 C) 97.8 F (36.6 C)  TempSrc: Oral  Oral Oral  SpO2:  99% 99% 100%  Weight:    108.1 kg  Height:        Intake/Output Summary (Last 24 hours) at 05/30/2021 1100 Last data filed at 05/30/2021 0908 Gross per 24 hour  Intake 1249.88 ml  Output 1550 ml  Net -300.12 ml    Filed Weights   05/28/21 1157 05/29/21 0526 05/30/21 0502  Weight: 107 kg 108.1 kg 108.1 kg   Examination: General exam: Appears calm and comfortable  Respiratory system: Clear to auscultation. Respiratory effort normal.  On room air without conversational dyspnea Cardiovascular system: S1 & S2 heard. No pedal edema. Gastrointestinal system: Abdomen is nondistended, soft and nontender. Normal bowel sounds heard. Central nervous system: Alert and oriented. Non focal exam. Speech clear  Extremities: Symmetric in appearance bilaterally  Skin: No rashes, lesions or ulcers on exposed skin  Psychiatry: Judgement and insight appear stable. Mood & affect appropriate.    Data Reviewed: I have personally reviewed following labs and imaging studies  CBC: Recent Labs  Lab 05/28/21 1350 05/29/21 0142 05/30/21 0247  WBC 4.8 5.3 4.0  NEUTROABS 3.2  --   --   HGB 12.8* 12.4* 13.3  HCT 39.1 37.2* 39.3  MCV 91.8 89.2 87.9  PLT 175 161 697    Basic Metabolic Panel: Recent Labs  Lab 05/28/21 1350 05/29/21 0201  NA 134* 134*  K 4.6 4.1  CL 103 106  CO2 25 21*  GLUCOSE 98 116*  BUN 21 20  CREATININE 1.36* 1.22  CALCIUM 9.3 9.3    GFR: Estimated Creatinine Clearance: 83.5 mL/min (by C-G formula based on SCr of 1.22 mg/dL). Liver  Function Tests: Recent Labs  Lab 05/28/21 1350  AST 10*  ALT 10  ALKPHOS 50  BILITOT 0.7  PROT 8.0  ALBUMIN 3.6    No results for input(s): LIPASE, AMYLASE in the last 168 hours. No results for input(s): AMMONIA in the last 168 hours. Coagulation Profile: Recent Labs  Lab 05/28/21 1350  INR 1.7*    Cardiac Enzymes: No results for input(s): CKTOTAL, CKMB, CKMBINDEX, TROPONINI in the last 168 hours. BNP (last 3 results) No  results for input(s): PROBNP in the last 8760 hours. HbA1C: No results for input(s): HGBA1C in the last 72 hours. CBG: No results for input(s): GLUCAP in the last 168 hours. Lipid Profile: No results for input(s): CHOL, HDL, LDLCALC, TRIG, CHOLHDL, LDLDIRECT in the last 72 hours. Thyroid Function Tests: Recent Labs    05/29/21 0201 05/29/21 0943  TSH 4.689*  --   FREET4  --  1.18*    Anemia Panel: No results for input(s): VITAMINB12, FOLATE, FERRITIN, TIBC, IRON, RETICCTPCT in the last 72 hours. Sepsis Labs: Recent Labs  Lab 05/28/21 1339  LATICACIDVEN 1.4     Recent Results (from the past 240 hour(s))  Urine Culture     Status: Abnormal   Collection Time: 05/28/21  1:17 PM   Specimen: Urine, Clean Catch  Result Value Ref Range Status   Specimen Description   Final    URINE, CLEAN CATCH Performed at [facility], [address], [place], [place] [address]    Special Requests   Final    NONE Performed at [facility], [address], [place], [place] [address]    Culture (A)  Final    <10,000 COLONIES/mL INSIGNIFICANT GROWTH Performed at [facility], [place] [address] [place], [place] [address]    Report Status 05/30/2021 FINAL  Final  Blood Culture (routine x 2)     Status: None (Preliminary result)   Collection Time: 05/28/21  1:39 PM   Specimen: Right Antecubital; Blood  Result Value Ref Range Status   Specimen Description   Final    RIGHT ANTECUBITAL BOTTLES DRAWN AEROBIC AND ANAEROBIC   Special Requests   Final     Blood Culture adequate volume Performed at [facility], [address], [place], [place] [address]    Culture PENDING  Incomplete   Report Status PENDING  Incomplete  Blood Culture (routine x 2)     Status: None (Preliminary result)   Collection Time: 05/28/21  1:39 PM   Specimen: Left Antecubital; Blood  Result Value Ref Range Status   Specimen Description   Final    LEFT ANTECUBITAL BOTTLES DRAWN AEROBIC AND ANAEROBIC   Special Requests   Final    Blood Culture adequate volume Performed at [facility], [address], [place], [place] [address]    Culture PENDING  Incomplete   Report Status PENDING  Incomplete  Resp Panel by RT-PCR (Flu A&B, Covid) Nasopharyngeal Swab     Status: None   Collection Time: 05/28/21  1:49 PM   Specimen: Nasopharyngeal Swab; Nasopharyngeal(NP) swabs in vial transport medium  Result Value Ref Range Status   SARS Coronavirus 2 by RT PCR NEGATIVE NEGATIVE Final    Comment: (NOTE) SARS-CoV-2 target nucleic acids are NOT DETECTED.  The SARS-CoV-2 RNA is generally detectable in upper respiratory specimens during the acute phase of infection. The lowest concentration of SARS-CoV-2 viral copies this assay can detect is 138 copies/mL. A negative result does not preclude SARS-Cov-2 infection and should not be used as the sole basis for treatment or other patient management decisions. A negative result may occur with  improper specimen collection/handling, submission of specimen other than nasopharyngeal swab, presence of viral mutation(s) within the areas targeted by this assay, and inadequate number of viral copies(<138 copies/mL). A negative result must be combined with clinical observations, patient history, and epidemiological information. The expected result is Negative.  Fact Sheet for Patients:  EntrepreneurPulse.com.au  Fact Sheet for Healthcare Providers:  IncredibleEmployment.be  This test is no  t yet  approved or cleared by the [place] and  has been authorized for detection and/or diagnosis of SARS-CoV-2 by FDA under an Emergency Use Authorization (EUA). This EUA will remain  in effect (meaning this test can be used) for the duration of the COVID-19 declaration under Section 564(b)(1) of the Act, 21 U.S.C.section 360bbb-3(b)(1), unless the authorization is terminated  or revoked sooner.       Influenza A by PCR NEGATIVE NEGATIVE Final   Influenza B by PCR NEGATIVE NEGATIVE Final    Comment: (NOTE) The Xpert Xpress SARS-CoV-2/FLU/RSV plus assay is intended as an aid in the diagnosis of influenza from Nasopharyngeal swab specimens and should not be used as a sole basis for treatment. Nasal washings and aspirates are unacceptable for Xpert Xpress SARS-CoV-2/FLU/RSV testing.  Fact Sheet for Patients: EntrepreneurPulse.com.au  Fact Sheet for Healthcare Providers: IncredibleEmployment.be  This test is not yet approved or cleared by the [place] FDA and has been authorized for detection and/or diagnosis of SARS-CoV-2 by FDA under an Emergency Use Authorization (EUA). This EUA will remain in effect (meaning this test can be used) for the duration of the COVID-19 declaration under Section 564(b)(1) of the Act, 21 U.S.C. section 360bbb-3(b)(1), unless the authorization is terminated or revoked.  Performed at [facility], [address] [place], [place] [address]        Radiology Studies: US Venous Img Lower Bilateral (DVT)  Result Date: 05/28/2021 CLINICAL DATA:  History of septic embolism. Abnormal CT. History of pulmonary embolism. Evaluate for DVT. EXAM: BILATERAL LOWER EXTREMITY VENOUS DOPPLER ULTRASOUND TECHNIQUE: Gray-scale sonography with graded compression, as well as color Doppler and duplex ultrasound were performed to evaluate the lower extremity deep venous systems from the level of the common femoral vein and including  the common femoral, femoral, profunda femoral, popliteal and calf veins including the posterior tibial, peroneal and gastrocnemius veins when visible. The superficial great saphenous vein was also interrogated. Spectral Doppler was utilized to evaluate flow at rest and with distal augmentation maneuvers in the common femoral, femoral and popliteal veins. COMPARISON:  None. FINDINGS: RIGHT LOWER EXTREMITY Common Femoral Vein: No evidence of thrombus. Normal compressibility, respiratory phasicity and response to augmentation. Saphenofemoral Junction: No evidence of thrombus. Normal compressibility and flow on color Doppler imaging. Profunda Femoral Vein: No evidence of thrombus. Normal compressibility and flow on color Doppler imaging. Femoral Vein: No evidence of thrombus. Normal compressibility, respiratory phasicity and response to augmentation. Popliteal Vein: No evidence of thrombus. Normal compressibility, respiratory phasicity and response to augmentation. Calf Veins: No evidence of thrombus. Normal compressibility and flow on color Doppler imaging. Superficial Great Saphenous Vein: No evidence of thrombus. Normal compressibility. Venous Reflux:  None. Other Findings:  None. LEFT LOWER EXTREMITY Common Femoral Vein: No evidence of thrombus. Normal compressibility, respiratory phasicity and response to augmentation. Saphenofemoral Junction: No evidence of thrombus. Normal compressibility and flow on color Doppler imaging. Profunda Femoral Vein: No evidence of thrombus. Normal compressibility and flow on color Doppler imaging. Femoral Vein: No evidence of thrombus. Normal compressibility, respiratory phasicity and response to augmentation. Popliteal Vein: No evidence of thrombus. Normal compressibility, respiratory phasicity and response to augmentation. Calf Veins: No evidence of thrombus. Normal compressibility and flow on color Doppler imaging. Superficial Great Saphenous Vein: No evidence of thrombus. Normal  compressibility. Venous Reflux:  None. Other Findings:  None. IMPRESSION: No evidence of DVT within either lower extremity. Electronically Signed   By: Sandi Mariscal M.D.   On: 05/28/2021 17:36  DG Chest Port 1 View  Result Date: 05/28/2021 CLINICAL DATA:  Question sepsis EXAM: PORTABLE CHEST 1 VIEW COMPARISON:  Chest x-ray 05/13/2017.  Chest CT 05/25/2021 FINDINGS: Cardiac enlargement. AICD in satisfactory position. Negative for heart failure. No pleural effusion Approximately 15 mm ill-defined nodules in the right lung as noted on CT. IMPRESSION: Stable  lung nodules on the right, nonspecific in appearance. Negative for heart failure or pneumonia. Electronically Signed   By: Franchot Gallo M.D.   On: 05/28/2021 13:54   ECHOCARDIOGRAM COMPLETE  Result Date: 05/29/2021    ECHOCARDIOGRAM REPORT   Patient Name:   [name] [name] Date of Exam: 05/29/2021 Medical Rec #:  [id number]         Height:       76.0 in Accession #:    [id number]        Weight:       238.4 lb Date of Birth:  Nov 06, 1956         BSA:          2.387 m Patient Age:    41 years          BP:           102/71 mmHg Patient Gender: M                 HR:           105 bpm. Exam Location:  Inpatient Procedure: 2D Echo, Cardiac Doppler and Color Doppler Indications:    Endocarditis  History:        Patient has prior history of Echocardiogram examinations, most                 recent 11/29/2019. Cardiomyopathy and CHF, Arrythmias:Atrial                 Fibrillation; Risk Factors:Hypertension. 04/12/2012 pacer lead                 extraction.  Sonographer:    Luisa Hart RDCS Referring Phys: [id number] COURAGE [place]  1. Left ventricular ejection fraction, by estimation, is 35 to 40%. The left ventricle has moderately decreased function. The left ventricle demonstrates global hypokinesis. Left ventricular diastolic function could not be evaluated. There is the interventricular septum is flattened in systole and diastole, consistent with right  ventricular pressure and volume overload.  2. Right ventricular systolic function is mildly reduced. The right ventricular size is moderately enlarged. There is severely elevated pulmonary artery systolic pressure.  3. Left atrial size was mild to moderately dilated.  4. Right atrial size was severely dilated.  5. Moderate pericardial effusion. The pericardial effusion is circumferential. There is no evidence of cardiac tamponade.  6. The mitral valve is normal in structure. Trivial mitral valve regurgitation.  7. The tricuspid valve is abnormal. Tricuspid valve regurgitation is severe.  8. The aortic valve is tricuspid. Aortic valve regurgitation is not visualized.  9. The inferior vena cava is dilated in size with <50% respiratory variability, suggesting right atrial pressure of 15 mmHg. Comparison(s): Prior images reviewed side by side. The pericardial effusion is larger compared to January 2021. No other significant changes are seen. Conclusion(s)/Recommendation(s): No evidence of valvular vegetations on this transthoracic echocardiogram. Would recommend a transesophageal echocardiogram to exclude infective endocarditis if clinically indicated. FINDINGS  Left Ventricle: Left ventricular ejection fraction, by estimation, is 35 to 40%. The left ventricle has moderately decreased function. The left ventricle demonstrates global hypokinesis. The left ventricular internal cavity size was  normal in size. There is no left ventricular hypertrophy. The interventricular septum is flattened in systole and diastole, consistent with right ventricular pressure and volume overload. Left ventricular diastolic function could not be evaluated due to atrial fibrillation. Left ventricular diastolic function could not be evaluated. Right Ventricle: The right ventricular size is moderately enlarged. No increase in right ventricular wall thickness. Right ventricular systolic function is mildly reduced. There is severely elevated  pulmonary artery systolic pressure. The tricuspid regurgitant velocity is 3.85 m/s, and with an assumed right atrial pressure of 15 mmHg, the estimated right ventricular systolic pressure is 62.8 mmHg. Left Atrium: Left atrial size was mild to moderately dilated. Right Atrium: Right atrial size was severely dilated. Pericardium: A moderately sized pericardial effusion is present. The pericardial effusion is circumferential. There is no evidence of cardiac tamponade. Mitral Valve: The mitral valve is normal in structure. Trivial mitral valve regurgitation. Tricuspid Valve: The tricuspid valve is abnormal. Tricuspid valve regurgitation is severe. Aortic Valve: The aortic valve is tricuspid. Aortic valve regurgitation is not visualized. Aortic valve mean gradient measures 2.0 mmHg. Aortic valve peak gradient measures 4.1 mmHg. Aortic valve area, by VTI measures 5.58 cm. Pulmonic Valve: The pulmonic valve was normal in structure. Pulmonic valve regurgitation is not visualized. Aorta: The aortic root and ascending aorta are structurally normal, with no evidence of dilitation. Venous: The inferior vena cava is dilated in size with less than 50% respiratory variability, suggesting right atrial pressure of 15 mmHg. IAS/Shunts: No atrial level shunt detected by color flow Doppler. Additional Comments: A device lead is visualized in the right ventricle.  LEFT VENTRICLE PLAX 2D LVOT diam:     2.70 cm LV SV:         89 LV SV Index:   37 LVOT Area:     5.73 cm  LV Volumes (MOD) LV vol d, MOD A2C: 97.9 ml LV vol d, MOD A4C: 76.4 ml LV vol s, MOD A2C: 57.9 ml LV vol s, MOD A4C: 54.8 ml LV SV MOD A2C:     40.0 ml LV SV MOD A4C:     76.4 ml LV SV MOD BP:      31.7 ml RIGHT VENTRICLE RV Basal diam:  3.70 cm RV Mid diam:    3.30 cm RV S prime:     10.60 cm/s TAPSE (M-mode): 1.6 cm LEFT ATRIUM             Index       RIGHT ATRIUM           Index LA diam:        5.30 cm 2.22 cm/m  RA Area:     31.10 cm LA Vol (A2C):   97.1 ml 40.68  ml/m RA Volume:   117.00 ml 49.02 ml/m LA Vol (A4C):   61.9 ml 25.93 ml/m LA Biplane Vol: 85.2 ml 35.69 ml/m  AORTIC VALVE                   PULMONIC VALVE AV Area (Vmax):    5.27 cm    PV Vmax:       1.06 m/s AV Area (Vmean):   5.66 cm    PV Vmean:      58.000 cm/s AV Area (VTI):     5.58 cm    PV VTI:        0.146 m AV Vmax:           101.00 cm/s PV Peak grad:  4.5  mmHg AV Vmean:          65.000 cm/s PV Mean grad:  2.0 mmHg AV VTI:            0.159 m AV Peak Grad:      4.1 mmHg AV Mean Grad:      2.0 mmHg LVOT Vmax:         93.00 cm/s LVOT Vmean:        64.200 cm/s LVOT VTI:          0.155 m LVOT/AV VTI ratio: 0.97  AORTA Ao Root diam: 3.40 cm Ao Asc diam:  3.30 cm MITRAL VALVE                TRICUSPID VALVE MV Area (PHT): 3.23 cm     TR Peak grad:   59.3 mmHg MV Decel Time: 235 msec     TR Vmax:        385.00 cm/s MV E velocity: 106.00 cm/s                             SHUNTS                             Systemic VTI:  0.16 m                             Systemic Diam: 2.70 cm Mihai Croitoru MD Electronically signed by Sanda Klein MD Signature Date/Time: 05/29/2021/12:49:29 PM    Final       Scheduled Meds:  carvedilol  3.125 mg Oral BID WC   cholecalciferol  2,000 Units Oral Daily   finasteride  5 mg Oral Daily   furosemide  20 mg Oral Daily   multivitamin with minerals  1 tablet Oral Daily   potassium chloride  10 mEq Oral Daily   pravastatin  40 mg Oral QHS   sacubitril-valsartan  1 tablet Oral BID   sodium chloride flush  3 mL Intravenous Q12H   sodium chloride flush  3 mL Intravenous Q12H   spironolactone  12.5 mg Oral Daily   Continuous Infusions:  sodium chloride     ceFEPime (MAXIPIME) IV 2 g (05/30/21 0548)   heparin 1,850 Units/hr (05/30/21 0759)   vancomycin 1,000 mg (05/30/21 0409)     LOS: 2 days      Time spent: 20 minutes   Dessa Phi, DO [facility] 05/30/2021, 11:00 AM   Available via Epic secure chat 7am-7pm After these hours, please refer to  coverage provider listed on [email or web address]

## 2021-05-30 NOTE — Progress Notes (Signed)
[place] for heparin Indication: pulmonary embolus  No Known Allergies  Patient Measurements: Height: 6\' 4"  (193 cm) Weight: 108.1 kg (238 lb 5.1 oz) IBW/kg (Calculated) : 86.8 Heparin Dosing Weight: 107 kg  Vital Signs: Temp: 97.8 F (36.6 C) (07/16 0502) Temp Source: Oral (07/16 0502) BP: 109/73 (07/16 0502) Pulse Rate: 89 (07/16 0502)  Labs: Recent Labs    05/28/21 1350 05/28/21 1634 05/29/21 0100 05/29/21 0142 05/29/21 0201 05/29/21 0943 05/30/21 0247  HGB 12.8*  --   --  12.4*  --   --  13.3  HCT 39.1  --   --  37.2*  --   --  39.3  PLT 175  --   --  161  --   --  189  APTT 40*  --  90*  --   --  88* 66*  LABPROT 20.3*  --   --   --   --   --   --   INR 1.7*  --   --   --   --   --   --   HEPARINUNFRC  --  >1.10*  --   --   --   --  1.03*  CREATININE 1.36*  --   --   --  1.22  --   --      Estimated Creatinine Clearance: 83.5 mL/min (by C-G formula based on SCr of 1.22 mg/dL).   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 Patient was on apixaban PTA for afib, last dose 7/14 ~0630. Pharmacy consulted for heparin.    APTT 66 at low end of goal and down trending. HL still high and not correlating on 1800 units/hr.  H/H, plt stable.   Goal of Therapy:  Heparin level 0.3-0.7 units/ml aPTT 66-102 seconds Monitor platelets by anticoagulation protocol: Yes    Plan:  Increase heparin to 1850 units/hr F/u aPTT until correlates with heparin level  Monitor daily HL, CBC/plt Monitor for signs/symptoms of bleeding  F/u long term plan    Benetta Spar, PharmD, BCPS, BCCP Clinical Pharmacist  Please check AMION for all [place] phone numbers After 10:00 PM, call [place]

## 2021-05-30 NOTE — Progress Notes (Signed)
Subjective: No new complaints   Antibiotics:  Anti-infectives (From admission, onward)    Start     Dose/Rate Route Frequency Ordered Stop   05/29/21 0400  vancomycin (VANCOCIN) IVPB 1000 mg/200 mL premix        1,000 mg 200 mL/hr over 60 Minutes Intravenous Every 12 hours 05/28/21 1433     05/28/21 1500  vancomycin (VANCOREADY) IVPB 2000 mg/400 mL        2,000 mg 200 mL/hr over 120 Minutes Intravenous  Once 05/28/21 1429 05/28/21 1853   05/28/21 1430  ceFEPIme (MAXIPIME) 2 g in sodium chloride 0.9 % 100 mL IVPB  Status:  Discontinued        2 g 200 mL/hr over 30 Minutes Intravenous  Once 05/28/21 1424 05/28/21 1428   05/28/21 1430  metroNIDAZOLE (FLAGYL) IVPB 500 mg        500 mg 100 mL/hr over 60 Minutes Intravenous  Once 05/28/21 1424 05/28/21 1627   05/28/21 1430  vancomycin (VANCOCIN) IVPB 1000 mg/200 mL premix  Status:  Discontinued        1,000 mg 200 mL/hr over 60 Minutes Intravenous  Once 05/28/21 1424 05/28/21 1429   05/28/21 1430  ceFEPIme (MAXIPIME) 2 g in sodium chloride 0.9 % 100 mL IVPB        2 g 200 mL/hr over 30 Minutes Intravenous Every 8 hours 05/28/21 1428         Medications: Scheduled Meds:  carvedilol  3.125 mg Oral BID WC   cholecalciferol  2,000 Units Oral Daily   finasteride  5 mg Oral Daily   furosemide  20 mg Oral Daily   multivitamin with minerals  1 tablet Oral Daily   potassium chloride  10 mEq Oral Daily   pravastatin  40 mg Oral QHS   sacubitril-valsartan  1 tablet Oral BID   sodium chloride flush  3 mL Intravenous Q12H   sodium chloride flush  3 mL Intravenous Q12H   spironolactone  12.5 mg Oral Daily   Continuous Infusions:  sodium chloride     ceFEPime (MAXIPIME) IV 2 g (05/30/21 1437)   heparin 1,850 Units/hr (05/30/21 0759)   vancomycin 1,000 mg (05/30/21 1605)   PRN Meds:.sodium chloride, acetaminophen **OR** acetaminophen, bisacodyl, ondansetron **OR** ondansetron (ZOFRAN) IV, polyethylene glycol, sodium chloride  flush    Objective: Weight change: 1.051 kg  Intake/Output Summary (Last 24 hours) at 05/30/2021 1653 Last data filed at 05/30/2021 1527 Gross per 24 hour  Intake 726.35 ml  Output 1580 ml  Net -853.65 ml   Blood pressure 112/72, pulse 81, temperature 98.3 F (36.8 C), temperature source Oral, resp. rate 16, height 6\' 4"  (1.93 m), weight 108.1 kg, SpO2 100 %. Temp:  [97.8 F (36.6 C)-98.5 F (36.9 C)] 98.3 F (36.8 C) (07/16 1355) Pulse Rate:  [81-89] 81 (07/16 1355) Resp:  [16-17] 16 (07/16 1355) BP: (108-112)/(72-79) 112/72 (07/16 1355) SpO2:  [99 %-100 %] 100 % (07/16 1355) Weight:  [108.1 kg] 108.1 kg (07/16 0502)  Physical Exam: Physical Exam Constitutional:      Appearance: He is well-developed.  HENT:     Head: Normocephalic and atraumatic.  Eyes:     Conjunctiva/sclera: Conjunctivae normal.  Cardiovascular:     Rate and Rhythm: Normal rate and regular rhythm.     Heart sounds: No murmur heard.   No friction rub. No gallop.     Comments: ICD site is clean Pulmonary:     Effort: Pulmonary  effort is normal. No respiratory distress.     Breath sounds: Normal breath sounds. No stridor. No wheezing or rhonchi.  Abdominal:     General: Abdomen is flat. There is no distension.     Palpations: Abdomen is soft. There is no mass.     Tenderness: There is no abdominal tenderness.     Hernia: No hernia is present.  Musculoskeletal:        General: Normal range of motion.     Cervical back: Normal range of motion and neck supple.  Skin:    General: Skin is warm and dry.     Findings: No erythema or rash.  Neurological:     General: No focal deficit present.     Mental Status: He is alert and oriented to person, place, and time.  Psychiatric:        Mood and Affect: Mood normal.        Behavior: Behavior normal.        Thought Content: Thought content normal.        Judgment: Judgment normal.     CBC:    BMET Recent Labs    05/28/21 1350 05/29/21 0201   NA 134* 134*  K 4.6 4.1  CL 103 106  CO2 25 21*  GLUCOSE 98 116*  BUN 21 20  CREATININE 1.36* 1.22  CALCIUM 9.3 9.3     Liver Panel  Recent Labs    05/28/21 1350  PROT 8.0  ALBUMIN 3.6  AST 10*  ALT 10  ALKPHOS 50  BILITOT 0.7       Sedimentation Rate Recent Labs    05/29/21 0201  ESRSEDRATE 63*   C-Reactive Protein Recent Labs    05/28/21 1634  CRP 9.6*    Micro Results: Recent Results (from the past 720 hour(s))  Urine Culture     Status: Abnormal   Collection Time: 05/28/21  1:17 PM   Specimen: Urine, Clean Catch  Result Value Ref Range Status   Specimen Description   Final    URINE, CLEAN CATCH Performed at [facility], [address] [place], [place] [address]    Special Requests   Final    NONE Performed at [facility], [address], [place], [place] [address]    Culture (A)  Final    <10,000 COLONIES/mL INSIGNIFICANT GROWTH Performed at [facility], [place] [address], [place], [place] [address]    Report Status 05/30/2021 FINAL  Final  Blood Culture (routine x 2)     Status: None (Preliminary result)   Collection Time: 05/28/21  1:39 PM   Specimen: Right Antecubital; Blood  Result Value Ref Range Status   Specimen Description   Final    RIGHT ANTECUBITAL BOTTLES DRAWN AEROBIC AND ANAEROBIC   Special Requests   Final    Blood Culture adequate volume Performed at [facility], [address] [place], [place] [address]    Culture PENDING  Incomplete   Report Status PENDING  Incomplete  Blood Culture (routine x 2)     Status: None (Preliminary result)   Collection Time: 05/28/21  1:39 PM   Specimen: Left Antecubital; Blood  Result Value Ref Range Status   Specimen Description   Final    LEFT ANTECUBITAL BOTTLES DRAWN AEROBIC AND ANAEROBIC   Special Requests   Final    Blood Culture adequate volume Performed at [facility], [address], [place],  [address]    Culture PENDING  Incomplete  Report Status PENDING   Incomplete  Resp Panel by RT-PCR (Flu A&B, Covid) Nasopharyngeal Swab     Status: None   Collection Time: 05/28/21  1:49 PM   Specimen: Nasopharyngeal Swab; Nasopharyngeal(NP) swabs in vial transport medium  Result Value Ref Range Status   SARS Coronavirus 2 by RT PCR NEGATIVE NEGATIVE Final    Comment: (NOTE) SARS-CoV-2 target nucleic acids are NOT DETECTED.  The SARS-CoV-2 RNA is generally detectable in upper respiratory specimens during the acute phase of infection. The lowest concentration of SARS-CoV-2 viral copies this assay can detect is 138 copies/mL. A negative result does not preclude SARS-Cov-2 infection and should not be used as the sole basis for treatment or other patient management decisions. A negative result may occur with  improper specimen collection/handling, submission of specimen other than nasopharyngeal swab, presence of viral mutation(s) within the areas targeted by this assay, and inadequate number of viral copies(<138 copies/mL). A negative result must be combined with clinical observations, patient history, and epidemiological information. The expected result is Negative.  Fact Sheet for Patients:  EntrepreneurPulse.com.au  Fact Sheet for Healthcare Providers:  IncredibleEmployment.be  This test is no t yet approved or cleared by the [place] FDA and  has been authorized for detection and/or diagnosis of SARS-CoV-2 by FDA under an Emergency Use Authorization (EUA). This EUA will remain  in effect (meaning this test can be used) for the duration of the COVID-19 declaration under Section 564(b)(1) of the Act, 21 U.S.C.section 360bbb-3(b)(1), unless the authorization is terminated  or revoked sooner.       Influenza A by PCR NEGATIVE NEGATIVE Final   Influenza B by PCR NEGATIVE NEGATIVE Final    Comment: (NOTE) The Xpert Xpress SARS-CoV-2/FLU/RSV plus assay is intended as an aid in the diagnosis of influenza  from Nasopharyngeal swab specimens and should not be used as a sole basis for treatment. Nasal washings and aspirates are unacceptable for Xpert Xpress SARS-CoV-2/FLU/RSV testing.  Fact Sheet for Patients: EntrepreneurPulse.com.au  Fact Sheet for Healthcare Providers: IncredibleEmployment.be  This test is not yet approved or cleared by the [place] FDA and has been authorized for detection and/or diagnosis of SARS-CoV-2 by FDA under an Emergency Use Authorization (EUA). This EUA will remain in effect (meaning this test can be used) for the duration of the COVID-19 declaration under Section 564(b)(1) of the Act, 21 U.S.C. section 360bbb-3(b)(1), unless the authorization is terminated or revoked.  Performed at [facility], [address] [place], [place] [address]     Studies/Results: US Venous Img Lower Bilateral (DVT)  Result Date: 05/28/2021 CLINICAL DATA:  History of septic embolism. Abnormal CT. History of pulmonary embolism. Evaluate for DVT. EXAM: BILATERAL LOWER EXTREMITY VENOUS DOPPLER ULTRASOUND TECHNIQUE: Gray-scale sonography with graded compression, as well as color Doppler and duplex ultrasound were performed to evaluate the lower extremity deep venous systems from the level of the common femoral vein and including the common femoral, femoral, profunda femoral, popliteal and calf veins including the posterior tibial, peroneal and gastrocnemius veins when visible. The superficial great saphenous vein was also interrogated. Spectral Doppler was utilized to evaluate flow at rest and with distal augmentation maneuvers in the common femoral, femoral and popliteal veins. COMPARISON:  None. FINDINGS: RIGHT LOWER EXTREMITY Common Femoral Vein: No evidence of thrombus. Normal compressibility, respiratory phasicity and response to augmentation. Saphenofemoral Junction: No evidence of thrombus. Normal compressibility and flow on color Doppler  imaging. Profunda Femoral Vein: No evidence of thrombus. Normal compressibility and flow on  color Doppler imaging. Femoral Vein: No evidence of thrombus. Normal compressibility, respiratory phasicity and response to augmentation. Popliteal Vein: No evidence of thrombus. Normal compressibility, respiratory phasicity and response to augmentation. Calf Veins: No evidence of thrombus. Normal compressibility and flow on color Doppler imaging. Superficial Great Saphenous Vein: No evidence of thrombus. Normal compressibility. Venous Reflux:  None. Other Findings:  None. LEFT LOWER EXTREMITY Common Femoral Vein: No evidence of thrombus. Normal compressibility, respiratory phasicity and response to augmentation. Saphenofemoral Junction: No evidence of thrombus. Normal compressibility and flow on color Doppler imaging. Profunda Femoral Vein: No evidence of thrombus. Normal compressibility and flow on color Doppler imaging. Femoral Vein: No evidence of thrombus. Normal compressibility, respiratory phasicity and response to augmentation. Popliteal Vein: No evidence of thrombus. Normal compressibility, respiratory phasicity and response to augmentation. Calf Veins: No evidence of thrombus. Normal compressibility and flow on color Doppler imaging. Superficial Great Saphenous Vein: No evidence of thrombus. Normal compressibility. Venous Reflux:  None. Other Findings:  None. IMPRESSION: No evidence of DVT within either lower extremity. Electronically Signed   By: Sandi Mariscal M.D.   On: 05/28/2021 17:36   ECHOCARDIOGRAM COMPLETE  Result Date: 05/29/2021    ECHOCARDIOGRAM REPORT   Patient Name:   [name] [name] Date of Exam: 05/29/2021 Medical Rec #:  [id number]         Height:       76.0 in Accession #:    [id number]        Weight:       238.4 lb Date of Birth:  05-Sep-1956         BSA:          2.387 m Patient Age:    75 years          BP:           102/71 mmHg Patient Gender: M                 HR:           105 bpm. Exam  Location:  Inpatient Procedure: 2D Echo, Cardiac Doppler and Color Doppler Indications:    Endocarditis  History:        Patient has prior history of Echocardiogram examinations, most                 recent 11/29/2019. Cardiomyopathy and CHF, Arrythmias:Atrial                 Fibrillation; Risk Factors:Hypertension. 04/12/2012 pacer lead                 extraction.  Sonographer:    Luisa Hart RDCS Referring Phys: [id number] COURAGE [place]  1. Left ventricular ejection fraction, by estimation, is 35 to 40%. The left ventricle has moderately decreased function. The left ventricle demonstrates global hypokinesis. Left ventricular diastolic function could not be evaluated. There is the interventricular septum is flattened in systole and diastole, consistent with right ventricular pressure and volume overload.  2. Right ventricular systolic function is mildly reduced. The right ventricular size is moderately enlarged. There is severely elevated pulmonary artery systolic pressure.  3. Left atrial size was mild to moderately dilated.  4. Right atrial size was severely dilated.  5. Moderate pericardial effusion. The pericardial effusion is circumferential. There is no evidence of cardiac tamponade.  6. The mitral valve is normal in structure. Trivial mitral valve regurgitation.  7. The tricuspid valve is abnormal. Tricuspid valve regurgitation is severe.  8. The aortic valve  is tricuspid. Aortic valve regurgitation is not visualized.  9. The inferior vena cava is dilated in size with <50% respiratory variability, suggesting right atrial pressure of 15 mmHg. Comparison(s): Prior images reviewed side by side. The pericardial effusion is larger compared to January 2021. No other significant changes are seen. Conclusion(s)/Recommendation(s): No evidence of valvular vegetations on this transthoracic echocardiogram. Would recommend a transesophageal echocardiogram to exclude infective endocarditis if clinically  indicated. FINDINGS  Left Ventricle: Left ventricular ejection fraction, by estimation, is 35 to 40%. The left ventricle has moderately decreased function. The left ventricle demonstrates global hypokinesis. The left ventricular internal cavity size was normal in size. There is no left ventricular hypertrophy. The interventricular septum is flattened in systole and diastole, consistent with right ventricular pressure and volume overload. Left ventricular diastolic function could not be evaluated due to atrial fibrillation. Left ventricular diastolic function could not be evaluated. Right Ventricle: The right ventricular size is moderately enlarged. No increase in right ventricular wall thickness. Right ventricular systolic function is mildly reduced. There is severely elevated pulmonary artery systolic pressure. The tricuspid regurgitant velocity is 3.85 m/s, and with an assumed right atrial pressure of 15 mmHg, the estimated right ventricular systolic pressure is 40.1 mmHg. Left Atrium: Left atrial size was mild to moderately dilated. Right Atrium: Right atrial size was severely dilated. Pericardium: A moderately sized pericardial effusion is present. The pericardial effusion is circumferential. There is no evidence of cardiac tamponade. Mitral Valve: The mitral valve is normal in structure. Trivial mitral valve regurgitation. Tricuspid Valve: The tricuspid valve is abnormal. Tricuspid valve regurgitation is severe. Aortic Valve: The aortic valve is tricuspid. Aortic valve regurgitation is not visualized. Aortic valve mean gradient measures 2.0 mmHg. Aortic valve peak gradient measures 4.1 mmHg. Aortic valve area, by VTI measures 5.58 cm. Pulmonic Valve: The pulmonic valve was normal in structure. Pulmonic valve regurgitation is not visualized. Aorta: The aortic root and ascending aorta are structurally normal, with no evidence of dilitation. Venous: The inferior vena cava is dilated in size with less than 50%  respiratory variability, suggesting right atrial pressure of 15 mmHg. IAS/Shunts: No atrial level shunt detected by color flow Doppler. Additional Comments: A device lead is visualized in the right ventricle.  LEFT VENTRICLE PLAX 2D LVOT diam:     2.70 cm LV SV:         89 LV SV Index:   37 LVOT Area:     5.73 cm  LV Volumes (MOD) LV vol d, MOD A2C: 97.9 ml LV vol d, MOD A4C: 76.4 ml LV vol s, MOD A2C: 57.9 ml LV vol s, MOD A4C: 54.8 ml LV SV MOD A2C:     40.0 ml LV SV MOD A4C:     76.4 ml LV SV MOD BP:      31.7 ml RIGHT VENTRICLE RV Basal diam:  3.70 cm RV Mid diam:    3.30 cm RV S prime:     10.60 cm/s TAPSE (M-mode): 1.6 cm LEFT ATRIUM             Index       RIGHT ATRIUM           Index LA diam:        5.30 cm 2.22 cm/m  RA Area:     31.10 cm LA Vol (A2C):   97.1 ml 40.68 ml/m RA Volume:   117.00 ml 49.02 ml/m LA Vol (A4C):   61.9 ml 25.93 ml/m LA Biplane Vol: 85.2 ml  35.69 ml/m  AORTIC VALVE                   PULMONIC VALVE AV Area (Vmax):    5.27 cm    PV Vmax:       1.06 m/s AV Area (Vmean):   5.66 cm    PV Vmean:      58.000 cm/s AV Area (VTI):     5.58 cm    PV VTI:        0.146 m AV Vmax:           101.00 cm/s PV Peak grad:  4.5 mmHg AV Vmean:          65.000 cm/s PV Mean grad:  2.0 mmHg AV VTI:            0.159 m AV Peak Grad:      4.1 mmHg AV Mean Grad:      2.0 mmHg LVOT Vmax:         93.00 cm/s LVOT Vmean:        64.200 cm/s LVOT VTI:          0.155 m LVOT/AV VTI ratio: 0.97  AORTA Ao Root diam: 3.40 cm Ao Asc diam:  3.30 cm MITRAL VALVE                TRICUSPID VALVE MV Area (PHT): 3.23 cm     TR Peak grad:   59.3 mmHg MV Decel Time: 235 msec     TR Vmax:        385.00 cm/s MV E velocity: 106.00 cm/s                             SHUNTS                             Systemic VTI:  0.16 m                             Systemic Diam: 2.70 cm Mihai Croitoru MD Electronically signed by Sanda Klein MD Signature Date/Time: 05/29/2021/12:49:29 PM    Final       Assessment/Plan:  INTERVAL  HISTORY:    Principal Problem:   Possible Endocarditis- w/u in Progress Active Problems:   Atrial fibrillation (HCC)   Chronic systolic heart failure (HCC)   Implantable cardioverter-defibrillator (ICD) in situ   Cardiomyopathy, nonischemic (HCC)   Essential hypertension, benign   Chronic anticoagulation   Pulmonary embolism ([place])   Septic embolism ([place])    [name] [name] is a 65 y.o. 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  Transthoracic echocardiogram has not shown any vegetation.  He is currently on vancomycin and cefepime.  Blood cultures have so far been unrevealing   Would continue vancomycin and cefepime for now and would definitely pursue transesophageal echocardiogram to evaluate his heart valves and his device.     LOS: 2 days   Alcide Evener 05/30/2021, 4:53 PM

## 2021-05-31 LAB — VANCOMYCIN, PEAK: Vancomycin Pk: 41 ug/mL — ABNORMAL HIGH (ref 30–40)

## 2021-05-31 LAB — COMPREHENSIVE METABOLIC PANEL
ALT: 11 U/L (ref 0–44)
AST: 15 U/L (ref 15–41)
Albumin: 3.1 g/dL — ABNORMAL LOW (ref 3.5–5.0)
Alkaline Phosphatase: 43 U/L (ref 38–126)
Anion gap: 7 (ref 5–15)
BUN: 14 mg/dL (ref 8–23)
CO2: 22 mmol/L (ref 22–32)
Calcium: 10 mg/dL (ref 8.9–10.3)
Chloride: 106 mmol/L (ref 98–111)
Creatinine, Ser: 1.12 mg/dL (ref 0.61–1.24)
GFR, Estimated: >60 mL/min (ref >60)
Glucose, Bld: 97 mg/dL (ref 70–99)
Potassium: 4.7 mmol/L (ref 3.5–5.1)
Sodium: 135 mmol/L (ref 135–145)
Total Bilirubin: 0.6 mg/dL (ref 0.3–1.2)
Total Protein: 7.3 g/dL (ref 6.5–8.1)

## 2021-05-31 LAB — CBC
HCT: 37.8 % — ABNORMAL LOW (ref 39.0–52.0)
Hemoglobin: 12.5 g/dL — ABNORMAL LOW (ref 13.0–17.0)
MCH: 29.6 pg (ref 26.0–34.0)
MCHC: 33.1 g/dL (ref 30.0–36.0)
MCV: 89.4 fL (ref 80.0–100.0)
Platelets: 179 10*3/uL (ref 150–400)
RBC: 4.23 MIL/uL (ref 4.22–5.81)
RDW: 14.5 % (ref 11.5–15.5)
WBC: 3.8 10*3/uL — ABNORMAL LOW (ref 4.0–10.5)
nRBC: 0 % (ref 0.0–0.2)

## 2021-05-31 LAB — HEPARIN LEVEL (UNFRACTIONATED): Heparin Unfractionated: 0.46 IU/mL (ref 0.30–0.70)

## 2021-05-31 LAB — APTT: aPTT: 99 seconds — ABNORMAL HIGH (ref 24–36)

## 2021-05-31 MED ORDER — DAPAGLIFLOZIN PROPANEDIOL 10 MG PO TABS
10.0000 mg | ORAL_TABLET | Freq: Every day | ORAL | Status: DC
  Administered 2021-06-02 – 2021-06-04 (×3): 10 mg via ORAL
  Filled 2021-05-31 (×3): qty 1

## 2021-05-31 MED ORDER — DAPAGLIFLOZIN PROPANEDIOL 10 MG PO TABS: 10.0000 mg | ORAL_TABLET | Freq: Every day | ORAL | Status: DC

## 2021-05-31 NOTE — Progress Notes (Signed)
Pharmacy Antibiotic Note  [name] [name] is a 65 y.o. 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 Pharmacy has been consulted for Vancomycin and Cefepime dosing.   Renal fx stable, cultures ngtd.   Plan:  Vancomycin 1000 mg IV every 12 hours. Cefepime 2000 mg IV every 8 hours. F/u vanc levels 7/18, and TEE. F/u cultures, renal fx, clinical status, narrow abx as able.   Height: 6\' 4"  (193 cm) Weight: 104.5 kg (230 lb 6.4 oz) IBW/kg (Calculated) : 86.8  Temp (24hrs), Avg:98.2 F (36.8 C), Min:98 F (36.7 C), Max:98.3 F (36.8 C)  Recent Labs  Lab 05/28/21 1339 05/28/21 1350 05/29/21 0142 05/29/21 0201 05/30/21 0247 05/31/21 0118  WBC  --  4.8 5.3  --  4.0 3.8*  CREATININE  --  1.36*  --  1.22  --   --   LATICACIDVEN 1.4  --   --   --   --   --      Estimated Creatinine Clearance: 82.3 mL/min (by C-G formula based on SCr of 1.22 mg/dL).    No Known Allergies  Antimicrobials this admission: Vanco 7/14 >>  Cefepime 7/14 >>    Microbiology results: 7/14 BCx: ngtd 7/14 UCx: < 10k col/ml   Thank you for allowing pharmacy to be a part of this patient's care.  Vance Peper, PharmD PGY1 Resident 05/31/2021 8:37 AM   Please check AMION for all [place] phone numbers After 10:00 PM, call [place] [phone]

## 2021-05-31 NOTE — Progress Notes (Signed)
PROGRESS NOTE    [name]  [name]:[id number] DOB: 12/05/1955 DOA: 05/28/2021 PCP: Glenda Chroman, MD     Brief Narrative:  [name] [name] is a 65yo male with past medical history significant for chronic anemia, permanent A. fib, ICD placement status post cardiac arrest, combined systolic and diastolic heart failure, nonischemic cardiomyopathy, hypertension who presented to the emergency department with concerns of abnormal chest CT findings suspicious for multifocal mycotic aneurysms.  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  No evidence of valvular vegetation seen -Sent message for cardiology to arrange for TEE early this week -Blood cultures negative so far -Continue vancomycin, cefepime  Scattered subsegmental PE -Continue IV heparin -Venous doppler neg for DVT  -Remains on room air   Nonischemic cardiomyopathy, with chronic combined systolic and diastolic heart failure, status post ICD -Continue Coreg, Lasix, Entresto, Aldactone  Permanent A. fib -On Eliquis PTA -Continue IV heparin, Coreg  CKD stage IIIa -Stable  HLD -Continue pravachol   Elevated TSH -Free T4 elevated 1.18 -Repeat TSH in 4 to 6 weeks    DVT prophylaxis: IV heparin TED hose Start: 05/28/21 1457 SCDs  Start: 05/28/21 1456 Place TED hose Start: 05/28/21 1456  Code Status:     Code Status Orders  (From admission, onward)           Start     Ordered   05/28/21 1456  Full code  Continuous        05/28/21 1457           Code Status History     Date Active Date Inactive Code Status Order ID Comments User Context   05/13/2017 0642 05/13/2017 1852 Full Code [id number]  Oswald Hillock, MD Inpatient   09/07/2012 0159 09/29/2012 1438 Full Code [id number]  Fara Olden, RN Inpatient      Family Communication: None at bedside Disposition Plan:  Status is: Inpatient  Remains inpatient appropriate because:IV treatments appropriate due to intensity of illness or inability to take PO  Dispo: The patient is from: Home              Anticipated d/c is to: Home              Patient currently is not medically stable to d/c.  Remains on IV antibiotics.  Blood cultures pending.  He will need TEE    Difficult to place patient No      Consultants:  Pulmonology ID   Procedures:  None   Antimicrobials:  Anti-infectives (From admission, onward)    Start     Dose/Rate Route Frequency Ordered Stop   05/29/21 0400  vancomycin (VANCOCIN) IVPB 1000 mg/200 mL premix        1,000 mg 200 mL/hr over 60 Minutes Intravenous Every 12 hours 05/28/21 1433     05/28/21 1500  vancomycin (VANCOREADY) IVPB 2000 mg/400 mL  2,000 mg 200 mL/hr over 120 Minutes Intravenous  Once 05/28/21 1429 05/28/21 1853   05/28/21 1430  ceFEPIme (MAXIPIME) 2 g in sodium chloride 0.9 % 100 mL IVPB  Status:  Discontinued        2 g 200 mL/hr over 30 Minutes Intravenous  Once 05/28/21 1424 05/28/21 1428   05/28/21 1430  metroNIDAZOLE (FLAGYL) IVPB 500 mg        500 mg 100 mL/hr over 60 Minutes Intravenous  Once 05/28/21 1424 05/28/21 1627   05/28/21 1430  vancomycin (VANCOCIN) IVPB 1000 mg/200 mL premix  Status:  Discontinued        1,000 mg 200 mL/hr over 60 Minutes Intravenous  Once 05/28/21 1424 05/28/21  1429   05/28/21 1430  ceFEPIme (MAXIPIME) 2 g in sodium chloride 0.9 % 100 mL IVPB        2 g 200 mL/hr over 30 Minutes Intravenous Every 8 hours 05/28/21 1428          Objective: Vitals:   05/30/21 0502 05/30/21 1355 05/30/21 2027 05/31/21 0501  BP: 109/73 112/72 108/74 117/76  Pulse: 89 81 78   Resp: 17 16 18 16   Temp: 97.8 F (36.6 C) 98.3 F (36.8 C) 98 F (36.7 C) 98.2 F (36.8 C)  TempSrc: Oral Oral Oral Oral  SpO2: 100% 100% 100% 100%  Weight: 108.1 kg   104.5 kg  Height:        Intake/Output Summary (Last 24 hours) at 05/31/2021 1147 Last data filed at 05/31/2021 1107 Gross per 24 hour  Intake 1766.5 ml  Output 1975 ml  Net -208.5 ml    Filed Weights   05/29/21 0526 05/30/21 0502 05/31/21 0501  Weight: 108.1 kg 108.1 kg 104.5 kg  Examination: General exam: Appears calm and comfortable  Respiratory system: Clear to auscultation. Respiratory effort normal.  On room air without conversational dyspnea. Cardiovascular system: S1 & S2 heard, irregular rhythm, rate 80s. No pedal edema. Gastrointestinal system: Abdomen is nondistended, soft and nontender. Normal bowel sounds heard. Central nervous system: Alert and oriented. Non focal exam. Speech clear  Extremities: Symmetric in appearance bilaterally  Skin: No rashes, lesions or ulcers on exposed skin  Psychiatry: Judgement and insight appear stable. Mood & affect appropriate.     Data Reviewed: I have personally reviewed following labs and imaging studies  CBC: Recent Labs  Lab 05/28/21 1350 05/29/21 0142 05/30/21 0247 05/31/21 0118  WBC 4.8 5.3 4.0 3.8*  NEUTROABS 3.2  --   --   --   HGB 12.8* 12.4* 13.3 12.5*  HCT 39.1 37.2* 39.3 37.8*  MCV 91.8 89.2 87.9 89.4  PLT 175 161 189 086    Basic Metabolic Panel: Recent Labs  Lab 05/28/21 1350 05/29/21 0201 05/31/21 1010  NA 134* 134* 135  K 4.6 4.1 4.7  CL 103 106 106  CO2 25 21* 22  GLUCOSE 98 116* 97  BUN 21 20 14   CREATININE 1.36* 1.22  1.12  CALCIUM 9.3 9.3 10.0    GFR: Estimated Creatinine Clearance: 89.7 mL/min (by C-G formula based on SCr of 1.12 mg/dL). Liver Function Tests: Recent Labs  Lab 05/28/21 1350 05/31/21 1010  AST 10* 15  ALT 10 11  ALKPHOS 50 43  BILITOT 0.7 0.6  PROT 8.0 7.3  ALBUMIN 3.6 3.1*    No results for input(s): LIPASE, AMYLASE in the last 168 hours. No results for input(s): AMMONIA in the last 168 hours. Coagulation Profile: Recent Labs  Lab 05/28/21 1350  INR 1.7*    Cardiac Enzymes: No results for input(s): CKTOTAL, CKMB, CKMBINDEX, TROPONINI in the last 168 hours. BNP (last 3 results) No results for input(s): PROBNP in the last 8760 hours. HbA1C: No results for input(s): HGBA1C in the last 72 hours. CBG: No results for input(s): GLUCAP in the last 168 hours. Lipid Profile: No results for input(s): CHOL, HDL, LDLCALC, TRIG, CHOLHDL, LDLDIRECT in the last 72 hours. Thyroid Function Tests: Recent Labs    05/29/21 0201 05/29/21 0943  TSH 4.689*  --   FREET4  --  1.18*    Anemia Panel: No results for input(s): VITAMINB12, FOLATE, FERRITIN, TIBC, IRON, RETICCTPCT in the last 72 hours. Sepsis Labs: Recent Labs  Lab 05/28/21 1339  LATICACIDVEN 1.4     Recent Results (from the past 240 hour(s))  Urine Culture     Status: Abnormal   Collection Time: 05/28/21  1:17 PM   Specimen: Urine, Clean Catch  Result Value Ref Range Status   Specimen Description   Final    URINE, CLEAN CATCH Performed at [facility], [address] [place], [place] [address]    Special Requests   Final    NONE Performed at [facility], [address], [place], [place] [address]    Culture (A)  Final    <10,000 COLONIES/mL INSIGNIFICANT GROWTH Performed at [facility], [place] [address], [place], [place] [address]    Report Status 05/30/2021 FINAL  Final  Blood Culture (routine x 2)     Status: None (Preliminary result)   Collection Time: 05/28/21  1:39 PM   Specimen: Right  Antecubital; Blood  Result Value Ref Range Status   Specimen Description   Final    RIGHT ANTECUBITAL BOTTLES DRAWN AEROBIC AND ANAEROBIC   Special Requests Blood Culture adequate volume  Final   Culture   Final    NO GROWTH 3 DAYS Performed at [facility], [address] [place], [place] [address]    Report Status PENDING  Incomplete  Blood Culture (routine x 2)     Status: None (Preliminary result)   Collection Time: 05/28/21  1:39 PM   Specimen: Left Antecubital; Blood  Result Value Ref Range Status   Specimen Description   Final    LEFT ANTECUBITAL BOTTLES DRAWN AEROBIC AND ANAEROBIC   Special Requests Blood Culture adequate volume  Final   Culture   Final    NO GROWTH 3 DAYS Performed at [facility], [address] [place],  [address]    Report Status PENDING  Incomplete  Resp Panel by RT-PCR (Flu A&B, Covid) Nasopharyngeal Swab     Status: None   Collection Time: 05/28/21  1:49 PM   Specimen: Nasopharyngeal Swab; Nasopharyngeal(NP) swabs in vial transport medium  Result Value Ref Range Status   SARS Coronavirus 2 by RT PCR NEGATIVE NEGATIVE Final    Comment: (NOTE) SARS-CoV-2 target nucleic acids are NOT DETECTED.  The SARS-CoV-2 RNA is generally detectable in upper respiratory specimens during the acute phase of infection. The lowest concentration of SARS-CoV-2 viral copies this assay can detect is 138 copies/mL. A negative result does not preclude SARS-Cov-2 infection and should not be used as the sole basis for treatment or other patient management decisions. A negative result may occur with  improper specimen collection/handling, submission of specimen other than nasopharyngeal swab, presence of viral mutation(s) within the areas targeted by this assay, and inadequate number of viral copies(<138 copies/mL). A negative result must be combined with clinical  observations, patient history, and epidemiological information. The expected result is  Negative.  Fact Sheet for Patients:  EntrepreneurPulse.com.au  Fact Sheet for Healthcare Providers:  IncredibleEmployment.be  This test is no t yet approved or cleared by the [place] FDA and  has been authorized for detection and/or diagnosis of SARS-CoV-2 by FDA under an Emergency Use Authorization (EUA). This EUA will remain  in effect (meaning this test can be used) for the duration of the COVID-19 declaration under Section 564(b)(1) of the Act, 21 U.S.C.section 360bbb-3(b)(1), unless the authorization is terminated  or revoked sooner.       Influenza A by PCR NEGATIVE NEGATIVE Final   Influenza B by PCR NEGATIVE NEGATIVE Final    Comment: (NOTE) The Xpert Xpress SARS-CoV-2/FLU/RSV plus assay is intended as an aid in the diagnosis of influenza from Nasopharyngeal swab specimens and should not be used as a sole basis for treatment. Nasal washings and aspirates are unacceptable for Xpert Xpress SARS-CoV-2/FLU/RSV testing.  Fact Sheet for Patients: EntrepreneurPulse.com.au  Fact Sheet for Healthcare Providers: IncredibleEmployment.be  This test is not yet approved or cleared by the [place] FDA and has been authorized for detection and/or diagnosis of SARS-CoV-2 by FDA under an Emergency Use Authorization (EUA). This EUA will remain in effect (meaning this test can be used) for the duration of the COVID-19 declaration under Section 564(b)(1) of the Act, 21 U.S.C. section 360bbb-3(b)(1), unless the authorization is terminated or revoked.  Performed at [facility], [address], [place], [place] [address]        Radiology Studies: No results found.    Scheduled Meds:  carvedilol  3.125 mg Oral BID WC   cholecalciferol  2,000 Units Oral Daily   finasteride  5 mg Oral Daily   furosemide  20 mg Oral Daily   multivitamin with minerals  1 tablet Oral Daily   potassium chloride  10 mEq  Oral Daily   pravastatin  40 mg Oral QHS   sacubitril-valsartan  1 tablet Oral BID   sodium chloride flush  3 mL Intravenous Q12H   sodium chloride flush  3 mL Intravenous Q12H   spironolactone  12.5 mg Oral Daily   Continuous Infusions:  sodium chloride     ceFEPime (MAXIPIME) IV 2 g (05/31/21 2130)   heparin 1,850 Units/hr (05/31/21 1145)   vancomycin 1,000 mg (05/31/21 0455)     LOS: 3 days      Time spent: 20 minutes   Dessa Phi, DO [facility] 05/31/2021, 11:47 AM   Available via Epic secure chat 7am-7pm After these hours, please refer to coverage provider listed on [email or web address]

## 2021-05-31 NOTE — Progress Notes (Signed)
ANTICOAGULATION CONSULT NOTE  Pharmacy Consult for heparin Indication: pulmonary embolus 05/25/21  No Known Allergies  Patient Measurements: Height: 6\' 4"  (193 cm) Weight: 104.5 kg (230 lb 6.4 oz) IBW/kg (Calculated) : 86.8 Heparin Dosing Weight: 107 kg  Vital Signs: Temp: 98.2 F (36.8 C) (07/17 0501) Temp Source: Oral (07/17 0501) BP: 117/76 (07/17 0501) Pulse Rate: 78 (07/16 2027)  Labs: Recent Labs    05/28/21 1350 05/28/21 1634 05/29/21 0100 05/29/21 0142 05/29/21 0201 05/29/21 0943 05/30/21 0247 05/31/21 0118  HGB 12.8*  --   --  12.4*  --   --  13.3 12.5*  HCT 39.1  --   --  37.2*  --   --  39.3 37.8*  PLT 175  --   --  161  --   --  189 179  APTT 40*  --    < >  --   --  88* 66* 99*  LABPROT 20.3*  --   --   --   --   --   --   --   INR 1.7*  --   --   --   --   --   --   --   HEPARINUNFRC  --  >1.10*  --   --   --   --  1.03* 0.46  CREATININE 1.36*  --   --   --  1.22  --   --   --    < > = values in this interval not displayed.     Estimated Creatinine Clearance: 82.3 mL/min (by C-G formula based on SCr of 1.22 mg/dL).   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 Patient was on apixaban PTA for afib, last dose 7/14 ~0630. Pharmacy consulted for heparin.    APTT 99 at high end of goal and up trending. HL therapeutic at 0.46, H/H, plt stable. HL correlates to anticipated apix washout  period, will follow HL.    Goal of Therapy:  Heparin level 0.3-0.7 units/ml Monitor platelets by anticoagulation protocol: Yes  Plan:  Continue heparin at 1850 units/hr Monitor daily HL, CBC/plt Monitor for signs/symptoms of bleeding  F/u long term plan   Vance Peper, PharmD PGY1 Resident 05/31/2021 7:43 AM    Please check AMION for all [place] phone numbers After 10:00 PM, call [place] [phone]

## 2021-06-01 DIAGNOSIS — I428 Other cardiomyopathies: Secondary | ICD-10-CM | POA: Diagnosis not present

## 2021-06-01 DIAGNOSIS — I38 Endocarditis, valve unspecified: Secondary | ICD-10-CM | POA: Diagnosis not present

## 2021-06-01 DIAGNOSIS — I330 Acute and subacute infective endocarditis: Secondary | ICD-10-CM | POA: Diagnosis not present

## 2021-06-01 DIAGNOSIS — I4811 Longstanding persistent atrial fibrillation: Secondary | ICD-10-CM | POA: Diagnosis not present

## 2021-06-01 LAB — CBC
HCT: 37.8 % — ABNORMAL LOW (ref 39.0–52.0)
Hemoglobin: 12.8 g/dL — ABNORMAL LOW (ref 13.0–17.0)
MCH: 30 pg (ref 26.0–34.0)
MCHC: 33.9 g/dL (ref 30.0–36.0)
MCV: 88.7 fL (ref 80.0–100.0)
Platelets: 176 10*3/uL (ref 150–400)
RBC: 4.26 MIL/uL (ref 4.22–5.81)
RDW: 14.5 % (ref 11.5–15.5)
WBC: 4.4 10*3/uL (ref 4.0–10.5)
nRBC: 0 % (ref 0.0–0.2)

## 2021-06-01 LAB — CREATININE, SERUM
Creatinine, Ser: 1.13 mg/dL (ref 0.61–1.24)
GFR, Estimated: >60 mL/min (ref >60)

## 2021-06-01 LAB — ANCA TITERS
Atypical P-ANCA titer: <1:20 {titer}
C-ANCA: <1:20 {titer}
P-ANCA: <1:20 {titer}

## 2021-06-01 LAB — VANCOMYCIN, TROUGH: Vancomycin Tr: 22 ug/mL (ref 15–20)

## 2021-06-01 LAB — VANCOMYCIN, PEAK: Vancomycin Pk: 33 ug/mL (ref 30–40)

## 2021-06-01 LAB — ANA W/REFLEX IF POSITIVE: Anti Nuclear Antibody (ANA): NEGATIVE

## 2021-06-01 LAB — HEPARIN LEVEL (UNFRACTIONATED): Heparin Unfractionated: 0.31 IU/mL (ref 0.30–0.70)

## 2021-06-01 MED ORDER — SODIUM CHLORIDE 0.9 % IV SOLN: INTRAVENOUS | Status: DC

## 2021-06-01 MED ORDER — VANCOMYCIN HCL 1500 MG/300ML IV SOLN
1500.0000 mg | INTRAVENOUS | Status: DC
  Filled 2021-06-01: qty 300

## 2021-06-01 NOTE — Progress Notes (Signed)
PROGRESS NOTE    [name] [name]  WHQ:[id number] DOB: 1956/04/27 DOA: 05/28/2021 PCP: Glenda Chroman, MD     Brief Narrative:  [name] [name] is a 65yo male with past medical history significant for chronic anemia, permanent A. fib, ICD placement status post cardiac arrest, combined systolic and diastolic heart failure, nonischemic cardiomyopathy, hypertension who presented to the emergency department with concerns of abnormal chest CT findings suspicious for multifocal mycotic aneurysms.  New events last 24 hours / Subjective: No new issues overnight, no CP or SOB. Awaiting TEE. Wants to go home.   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  No evidence of valvular vegetation seen -Blood cultures negative to date  -Continue vancomycin, cefepime -TEE scheduled 7/19   Scattered subsegmental PE -Continue IV heparin -Venous doppler neg for DVT  -Remains on room air   Nonischemic cardiomyopathy, with chronic combined systolic and diastolic heart failure, status post ICD -Continue Coreg, Lasix, Entresto, Aldactone  Permanent A. fib -On Eliquis PTA -Continue IV heparin, Coreg  CKD stage IIIa -Stable  HLD -Continue pravachol   Elevated TSH -Free T4 elevated 1.18 -Repeat TSH in 4 to 6 weeks    DVT prophylaxis: IV heparin TED hose Start: 05/28/21 1457 SCDs Start: 05/28/21 1456 Place TED hose  Start: 05/28/21 1456  Code Status:     Code Status Orders  (From admission, onward)           Start     Ordered   05/28/21 1456  Full code  Continuous        05/28/21 1457           Code Status History     Date Active Date Inactive Code Status Order ID Comments User Context   05/13/2017 0642 05/13/2017 1852 Full Code [id number]  Oswald Hillock, MD Inpatient   09/07/2012 0159 09/29/2012 1438 Full Code [id number]  Fara Olden, RN Inpatient      Family Communication: None at bedside Disposition Plan:  Status is: Inpatient  Remains inpatient appropriate because:IV treatments appropriate due to intensity of illness or inability to take PO  Dispo: The patient is from: Home              Anticipated d/c is to: Home              Patient currently is not medically stable to d/c.  Remains on IV antibiotics.  TEE scheduled 7/19    Difficult to place patient No      Consultants:  Pulmonology ID   Procedures:  None   Antimicrobials:  Anti-infectives (From admission, onward)    Start     Dose/Rate Route Frequency Ordered Stop   05/29/21 0400  vancomycin (VANCOCIN) IVPB 1000 mg/200 mL premix        1,000 mg 200 mL/hr over 60 Minutes Intravenous Every 12 hours 05/28/21 1433     05/28/21 1500  vancomycin (VANCOREADY) IVPB 2000 mg/400 mL  2,000 mg 200 mL/hr over 120 Minutes Intravenous  Once 05/28/21 1429 05/28/21 1853   05/28/21 1430  ceFEPIme (MAXIPIME) 2 g in sodium chloride 0.9 % 100 mL IVPB  Status:  Discontinued        2 g 200 mL/hr over 30 Minutes Intravenous  Once 05/28/21 1424 05/28/21 1428   05/28/21 1430  metroNIDAZOLE (FLAGYL) IVPB 500 mg        500 mg 100 mL/hr over 60 Minutes Intravenous  Once 05/28/21 1424 05/28/21 1627   05/28/21 1430  vancomycin (VANCOCIN) IVPB 1000 mg/200 mL premix  Status:  Discontinued        1,000 mg 200 mL/hr over 60 Minutes Intravenous  Once 05/28/21 1424 05/28/21 1429   05/28/21 1430  ceFEPIme (MAXIPIME) 2 g in sodium  chloride 0.9 % 100 mL IVPB        2 g 200 mL/hr over 30 Minutes Intravenous Every 8 hours 05/28/21 1428          Objective: Vitals:   05/31/21 1320 05/31/21 2005 06/01/21 0455 06/01/21 0938  BP: 114/81 109/71 108/66 111/67  Pulse: 79 86  79  Resp:  18 16 16   Temp: 98.4 F (36.9 C) 98.4 F (36.9 C) 98.2 F (36.8 C) 98.2 F (36.8 C)  TempSrc: Oral Oral Oral Oral  SpO2: 100% 100% 98% 96%  Weight:   104.5 kg   Height:        Intake/Output Summary (Last 24 hours) at 06/01/2021 1046 Last data filed at 06/01/2021 0854 Gross per 24 hour  Intake 1637.17 ml  Output 1450 ml  Net 187.17 ml    Filed Weights   05/30/21 0502 05/31/21 0501 06/01/21 0455  Weight: 108.1 kg 104.5 kg 104.5 kg    Examination: General exam: Appears calm and comfortable  Respiratory system: Clear to auscultation. Respiratory effort normal. On room air  Cardiovascular system: S1 & S2 heard, irreg rhythm. No pedal edema. Gastrointestinal system: Abdomen is nondistended, soft and nontender. Normal bowel sounds heard. Central nervous system: Alert and oriented. Non focal exam. Speech clear  Extremities: Symmetric in appearance bilaterally  Skin: No rashes, lesions or ulcers on exposed skin  Psychiatry: Judgement and insight appear stable. Mood & affect appropriate.     Data Reviewed: I have personally reviewed following labs and imaging studies  CBC: Recent Labs  Lab 05/28/21 1350 05/29/21 0142 05/30/21 0247 05/31/21 0118 06/01/21 0616  WBC 4.8 5.3 4.0 3.8* 4.4  NEUTROABS 3.2  --   --   --   --   HGB 12.8* 12.4* 13.3 12.5* 12.8*  HCT 39.1 37.2* 39.3 37.8* 37.8*  MCV 91.8 89.2 87.9 89.4 88.7  PLT 175 161 189 179 341    Basic Metabolic Panel: Recent Labs  Lab 05/28/21 1350 05/29/21 0201 05/31/21 1010 06/01/21 0616  NA 134* 134* 135  --   K 4.6 4.1 4.7  --   CL 103 106 106  --   CO2 25 21* 22  --   GLUCOSE 98 116* 97  --   BUN 21 20 14   --   CREATININE 1.36* 1.22 1.12 1.13  CALCIUM  9.3 9.3 10.0  --     GFR: Estimated Creatinine Clearance: 88.9 mL/min (by C-G formula based on SCr of 1.13 mg/dL). Liver Function Tests: Recent Labs  Lab 05/28/21 1350 05/31/21 1010  AST 10* 15  ALT 10 11  ALKPHOS 50 43  BILITOT 0.7 0.6  PROT 8.0 7.3  ALBUMIN 3.6 3.1*  No results for input(s): LIPASE, AMYLASE in the last 168 hours. No results for input(s): AMMONIA in the last 168 hours. Coagulation Profile: Recent Labs  Lab 05/28/21 1350  INR 1.7*    Cardiac Enzymes: No results for input(s): CKTOTAL, CKMB, CKMBINDEX, TROPONINI in the last 168 hours. BNP (last 3 results) No results for input(s): PROBNP in the last 8760 hours. HbA1C: No results for input(s): HGBA1C in the last 72 hours. CBG: No results for input(s): GLUCAP in the last 168 hours. Lipid Profile: No results for input(s): CHOL, HDL, LDLCALC, TRIG, CHOLHDL, LDLDIRECT in the last 72 hours. Thyroid Function Tests: No results for input(s): TSH, T4TOTAL, FREET4, T3FREE, THYROIDAB in the last 72 hours.  Anemia Panel: No results for input(s): VITAMINB12, FOLATE, FERRITIN, TIBC, IRON, RETICCTPCT in the last 72 hours. Sepsis Labs: Recent Labs  Lab 05/28/21 1339  LATICACIDVEN 1.4     Recent Results (from the past 240 hour(s))  Urine Culture     Status: Abnormal   Collection Time: 05/28/21  1:17 PM   Specimen: Urine, Clean Catch  Result Value Ref Range Status   Specimen Description   Final    URINE, CLEAN CATCH Performed at [facility], [address], [place], [place] [address]    Special Requests   Final    NONE Performed at [facility], [address] [place], [place] [address]    Culture (A)  Final    <10,000 COLONIES/mL INSIGNIFICANT GROWTH Performed at [facility], [place] [address] [place], [place] [address]    Report Status 05/30/2021 FINAL  Final  Blood Culture (routine x 2)     Status: None (Preliminary result)   Collection Time: 05/28/21  1:39 PM   Specimen: Right Antecubital;  Blood  Result Value Ref Range Status   Specimen Description   Final    RIGHT ANTECUBITAL BOTTLES DRAWN AEROBIC AND ANAEROBIC   Special Requests Blood Culture adequate volume  Final   Culture   Final    NO GROWTH 3 DAYS Performed at [facility], [address], [place], [place] [address]    Report Status PENDING  Incomplete  Blood Culture (routine x 2)     Status: None (Preliminary result)   Collection Time: 05/28/21  1:39 PM   Specimen: Left Antecubital; Blood  Result Value Ref Range Status   Specimen Description   Final    LEFT ANTECUBITAL BOTTLES DRAWN AEROBIC AND ANAEROBIC   Special Requests Blood Culture adequate volume  Final   Culture   Final    NO GROWTH 3 DAYS Performed at [facility], [address] [place], [place] [address]    Report Status PENDING  Incomplete  Resp Panel by RT-PCR (Flu A&B, Covid) Nasopharyngeal Swab     Status: None   Collection Time: 05/28/21  1:49 PM   Specimen: Nasopharyngeal Swab; Nasopharyngeal(NP) swabs in vial transport medium  Result Value Ref Range Status   SARS Coronavirus 2 by RT PCR NEGATIVE NEGATIVE Final    Comment: (NOTE) SARS-CoV-2 target nucleic acids are NOT DETECTED.  The SARS-CoV-2 RNA is generally detectable in upper respiratory specimens during the acute phase of infection. The lowest concentration of SARS-CoV-2 viral copies this assay can detect is 138 copies/mL. A negative result does not preclude SARS-Cov-2 infection and should not be used as the sole basis for treatment or other patient management decisions. A negative result may occur with  improper specimen collection/handling, submission of specimen other than nasopharyngeal swab, presence of viral mutation(s) within  the areas targeted by this assay, and inadequate number of viral copies(<138 copies/mL). A negative result must be combined with clinical observations, patient history, and epidemiological information. The expected result is Negative.  Fact Sheet for  Patients:  EntrepreneurPulse.com.au  Fact Sheet for Healthcare Providers:  IncredibleEmployment.be  This test is no t yet approved or cleared by the [place] FDA and  has been authorized for detection and/or diagnosis of SARS-CoV-2 by FDA under an Emergency Use Authorization (EUA). This EUA will remain  in effect (meaning this test can be used) for the duration of the COVID-19 declaration under Section 564(b)(1) of the Act, 21 U.S.C.section 360bbb-3(b)(1), unless the authorization is terminated  or revoked sooner.       Influenza A by PCR NEGATIVE NEGATIVE Final   Influenza B by PCR NEGATIVE NEGATIVE Final    Comment: (NOTE) The Xpert Xpress SARS-CoV-2/FLU/RSV plus assay is intended as an aid in the diagnosis of influenza from Nasopharyngeal swab specimens and should not be used as a sole basis for treatment. Nasal washings and aspirates are unacceptable for Xpert Xpress SARS-CoV-2/FLU/RSV testing.  Fact Sheet for Patients: EntrepreneurPulse.com.au  Fact Sheet for Healthcare Providers: IncredibleEmployment.be  This test is not yet approved or cleared by the [place] FDA and has been authorized for detection and/or diagnosis of SARS-CoV-2 by FDA under an Emergency Use Authorization (EUA). This EUA will remain in effect (meaning this test can be used) for the duration of the COVID-19 declaration under Section 564(b)(1) of the Act, 21 U.S.C. section 360bbb-3(b)(1), unless the authorization is terminated or revoked.  Performed at [facility], [address], [place], [place] [address]        Radiology Studies: No results found.    Scheduled Meds:  carvedilol  3.125 mg Oral BID WC   cholecalciferol  2,000 Units Oral Daily   [START ON 06/02/2021] dapagliflozin propanediol  10 mg Oral Daily   finasteride  5 mg Oral Daily   furosemide  20 mg Oral Daily   multivitamin with minerals  1 tablet  Oral Daily   potassium chloride  10 mEq Oral Daily   pravastatin  40 mg Oral QHS   sacubitril-valsartan  1 tablet Oral BID   sodium chloride flush  3 mL Intravenous Q12H   sodium chloride flush  3 mL Intravenous Q12H   spironolactone  12.5 mg Oral Daily   Continuous Infusions:  sodium chloride     ceFEPime (MAXIPIME) IV Stopped (06/01/21 7124)   heparin 1,900 Units/hr (06/01/21 0945)   vancomycin 1,000 mg (06/01/21 0451)     LOS: 4 days      Time spent: 20 minutes   Dessa Phi, DO [facility] 06/01/2021, 10:46 AM   Available via Epic secure chat 7am-7pm After these hours, please refer to coverage provider listed on [email or web address]

## 2021-06-01 NOTE — Care Management Important Message (Signed)
Important Message  Patient Details  Name: [name] [name] MRN: [id number] Date of Birth: Mar 17, 1956   Medicare Important Message Given:  Yes     Shelda Altes 06/01/2021, 10:39 AM

## 2021-06-01 NOTE — Progress Notes (Signed)
[facility] for heparin Indication: pulmonary embolus 05/25/21  No Known Allergies  Patient Measurements: Height: 6\' 4"  (193 cm) Weight: 104.5 kg (230 lb 6.4 oz) IBW/kg (Calculated) : 86.8 Heparin Dosing Weight: 107 kg  Vital Signs: Temp: 98.2 F (36.8 C) (07/18 0455) Temp Source: Oral (07/18 0455) BP: 108/66 (07/18 0455)  Labs: Recent Labs    05/29/21 0943 05/30/21 0247 05/30/21 0247 05/31/21 0118 05/31/21 1010 06/01/21 0616  HGB  --  13.3   < > 12.5*  --  12.8*  HCT  --  39.3  --  37.8*  --  37.8*  PLT  --  189  --  179  --  176  APTT 88* 66*  --  99*  --   --   HEPARINUNFRC  --  1.03*  --  0.46  --  0.31  CREATININE  --   --   --   --  1.12 1.13   < > = values in this interval not displayed.     Estimated Creatinine Clearance: 88.9 mL/min (by C-G formula based on SCr of 1.13 mg/dL).   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 Patient was on apixaban PTA for afib, last dose 7/14 ~0630. Pharmacy consulted for heparin.    Heparin level therapeutic at 0.31 (at lower end of goal) on 1850 units/hr, CBC stable. Will increase heparin slightly to keep within goal.   Goal of Therapy:  Heparin level 0.3-0.7 units/ml Monitor platelets by anticoagulation protocol: Yes  Plan:  Increase IV heparin to 1900 units/hr Monitor daily HL, CBC, and for signs/symptoms of bleeding   F/u long term plan   Vance Peper, PharmD PGY1 Resident 06/01/2021 8:08 AM    Please check AMION for all [place] phone numbers After 10:00 PM, call [place] [phone]

## 2021-06-01 NOTE — Progress Notes (Signed)
    [facility] has been requested to perform a transesophageal echocardiogram on 06/02/21 for bacteremia.  After careful review of history and examination, the risks and benefits of transesophageal echocardiogram have been explained including risks of esophageal damage, perforation (1:10,000 risk), bleeding, pharyngeal hematoma as well as other potential complications associated with conscious sedation including aspiration, arrhythmia, respiratory failure and death. Alternatives to treatment were discussed, questions were answered. Patient is willing to proceed.   TEE scheduled for 06/02/21 at 9:30am with Dr. Stanford Breed.  Roby Lofts, PA-C 06/01/2021 11:19 AM

## 2021-06-01 NOTE — H&P (View-Only) (Signed)
PROGRESS NOTE    [name] [name]  OXB:[id number] DOB: 08/10/1956 DOA: 05/28/2021 PCP: Glenda Chroman, MD     Brief Narrative:  [name] [name] is a 65yo male with past medical history significant for chronic anemia, permanent A. fib, ICD placement status post cardiac arrest, combined systolic and diastolic heart failure, nonischemic cardiomyopathy, hypertension who presented to the emergency department with concerns of abnormal chest CT findings suspicious for multifocal mycotic aneurysms.  New events last 24 hours / Subjective: No new issues overnight, no CP or SOB. Awaiting TEE. Wants to go home.   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  No evidence of valvular vegetation seen -Blood cultures negative to date  -Continue vancomycin, cefepime -TEE scheduled 7/19   Scattered subsegmental PE -Continue IV heparin -Venous doppler neg for DVT  -Remains on room air   Nonischemic cardiomyopathy, with chronic combined systolic and diastolic heart failure, status post ICD -Continue Coreg, Lasix, Entresto, Aldactone  Permanent A. fib -On Eliquis PTA -Continue IV heparin, Coreg  CKD stage IIIa -Stable  HLD -Continue pravachol   Elevated TSH -Free T4 elevated 1.18 -Repeat TSH in 4 to 6 weeks    DVT prophylaxis: IV heparin TED hose Start: 05/28/21 1457 SCDs Start: 05/28/21 1456 Place TED hose  Start: 05/28/21 1456  Code Status:     Code Status Orders  (From admission, onward)           Start     Ordered   05/28/21 1456  Full code  Continuous        05/28/21 1457           Code Status History     Date Active Date Inactive Code Status Order ID Comments User Context   05/13/2017 0642 05/13/2017 1852 Full Code [id number]  Oswald Hillock, MD Inpatient   09/07/2012 0159 09/29/2012 1438 Full Code [id number]  Fara Olden, RN Inpatient      Family Communication: None at bedside Disposition Plan:  Status is: Inpatient  Remains inpatient appropriate because:IV treatments appropriate due to intensity of illness or inability to take PO  Dispo: The patient is from: Home              Anticipated d/c is to: Home              Patient currently is not medically stable to d/c.  Remains on IV antibiotics.  TEE scheduled 7/19    Difficult to place patient No      Consultants:  Pulmonology ID   Procedures:  None   Antimicrobials:  Anti-infectives (From admission, onward)    Start     Dose/Rate Route Frequency Ordered Stop   05/29/21 0400  vancomycin (VANCOCIN) IVPB 1000 mg/200 mL premix        1,000 mg 200 mL/hr over 60 Minutes Intravenous Every 12 hours 05/28/21 1433     05/28/21 1500  vancomycin (VANCOREADY) IVPB 2000 mg/400 mL  2,000 mg 200 mL/hr over 120 Minutes Intravenous  Once 05/28/21 1429 05/28/21 1853   05/28/21 1430  ceFEPIme (MAXIPIME) 2 g in sodium chloride 0.9 % 100 mL IVPB  Status:  Discontinued        2 g 200 mL/hr over 30 Minutes Intravenous  Once 05/28/21 1424 05/28/21 1428   05/28/21 1430  metroNIDAZOLE (FLAGYL) IVPB 500 mg        500 mg 100 mL/hr over 60 Minutes Intravenous  Once 05/28/21 1424 05/28/21 1627   05/28/21 1430  vancomycin (VANCOCIN) IVPB 1000 mg/200 mL premix  Status:  Discontinued        1,000 mg 200 mL/hr over 60 Minutes Intravenous  Once 05/28/21 1424 05/28/21 1429   05/28/21 1430  ceFEPIme (MAXIPIME) 2 g in sodium  chloride 0.9 % 100 mL IVPB        2 g 200 mL/hr over 30 Minutes Intravenous Every 8 hours 05/28/21 1428          Objective: Vitals:   05/31/21 1320 05/31/21 2005 06/01/21 0455 06/01/21 0938  BP: 114/81 109/71 108/66 111/67  Pulse: 79 86  79  Resp:  18 16 16   Temp: 98.4 F (36.9 C) 98.4 F (36.9 C) 98.2 F (36.8 C) 98.2 F (36.8 C)  TempSrc: Oral Oral Oral Oral  SpO2: 100% 100% 98% 96%  Weight:   104.5 kg   Height:        Intake/Output Summary (Last 24 hours) at 06/01/2021 1046 Last data filed at 06/01/2021 0854 Gross per 24 hour  Intake 1637.17 ml  Output 1450 ml  Net 187.17 ml    Filed Weights   05/30/21 0502 05/31/21 0501 06/01/21 0455  Weight: 108.1 kg 104.5 kg 104.5 kg    Examination: General exam: Appears calm and comfortable  Respiratory system: Clear to auscultation. Respiratory effort normal. On room air  Cardiovascular system: S1 & S2 heard, irreg rhythm. No pedal edema. Gastrointestinal system: Abdomen is nondistended, soft and nontender. Normal bowel sounds heard. Central nervous system: Alert and oriented. Non focal exam. Speech clear  Extremities: Symmetric in appearance bilaterally  Skin: No rashes, lesions or ulcers on exposed skin  Psychiatry: Judgement and insight appear stable. Mood & affect appropriate.     Data Reviewed: I have personally reviewed following labs and imaging studies  CBC: Recent Labs  Lab 05/28/21 1350 05/29/21 0142 05/30/21 0247 05/31/21 0118 06/01/21 0616  WBC 4.8 5.3 4.0 3.8* 4.4  NEUTROABS 3.2  --   --   --   --   HGB 12.8* 12.4* 13.3 12.5* 12.8*  HCT 39.1 37.2* 39.3 37.8* 37.8*  MCV 91.8 89.2 87.9 89.4 88.7  PLT 175 161 189 179 024    Basic Metabolic Panel: Recent Labs  Lab 05/28/21 1350 05/29/21 0201 05/31/21 1010 06/01/21 0616  NA 134* 134* 135  --   K 4.6 4.1 4.7  --   CL 103 106 106  --   CO2 25 21* 22  --   GLUCOSE 98 116* 97  --   BUN 21 20 14   --   CREATININE 1.36* 1.22 1.12 1.13  CALCIUM  9.3 9.3 10.0  --     GFR: Estimated Creatinine Clearance: 88.9 mL/min (by C-G formula based on SCr of 1.13 mg/dL). Liver Function Tests: Recent Labs  Lab 05/28/21 1350 05/31/21 1010  AST 10* 15  ALT 10 11  ALKPHOS 50 43  BILITOT 0.7 0.6  PROT 8.0 7.3  ALBUMIN 3.6 3.1*  No results for input(s): LIPASE, AMYLASE in the last 168 hours. No results for input(s): AMMONIA in the last 168 hours. Coagulation Profile: Recent Labs  Lab 05/28/21 1350  INR 1.7*    Cardiac Enzymes: No results for input(s): CKTOTAL, CKMB, CKMBINDEX, TROPONINI in the last 168 hours. BNP (last 3 results) No results for input(s): PROBNP in the last 8760 hours. HbA1C: No results for input(s): HGBA1C in the last 72 hours. CBG: No results for input(s): GLUCAP in the last 168 hours. Lipid Profile: No results for input(s): CHOL, HDL, LDLCALC, TRIG, CHOLHDL, LDLDIRECT in the last 72 hours. Thyroid Function Tests: No results for input(s): TSH, T4TOTAL, FREET4, T3FREE, THYROIDAB in the last 72 hours.  Anemia Panel: No results for input(s): VITAMINB12, FOLATE, FERRITIN, TIBC, IRON, RETICCTPCT in the last 72 hours. Sepsis Labs: Recent Labs  Lab 05/28/21 1339  LATICACIDVEN 1.4     Recent Results (from the past 240 hour(s))  Urine Culture     Status: Abnormal   Collection Time: 05/28/21  1:17 PM   Specimen: Urine, Clean Catch  Result Value Ref Range Status   Specimen Description   Final    URINE, CLEAN CATCH Performed at [facility], [address], [place], [place] [address]    Special Requests   Final    NONE Performed at [facility], [address] [place], [place] [address]    Culture (A)  Final    <10,000 COLONIES/mL INSIGNIFICANT GROWTH Performed at [facility], [place] [address], [place], [place] [address]    Report Status 05/30/2021 FINAL  Final  Blood Culture (routine x 2)     Status: None (Preliminary result)   Collection Time: 05/28/21  1:39 PM   Specimen: Right Antecubital;  Blood  Result Value Ref Range Status   Specimen Description   Final    RIGHT ANTECUBITAL BOTTLES DRAWN AEROBIC AND ANAEROBIC   Special Requests Blood Culture adequate volume  Final   Culture   Final    NO GROWTH 3 DAYS Performed at [facility], [address] [place], [place] [address]    Report Status PENDING  Incomplete  Blood Culture (routine x 2)     Status: None (Preliminary result)   Collection Time: 05/28/21  1:39 PM   Specimen: Left Antecubital; Blood  Result Value Ref Range Status   Specimen Description   Final    LEFT ANTECUBITAL BOTTLES DRAWN AEROBIC AND ANAEROBIC   Special Requests Blood Culture adequate volume  Final   Culture   Final    NO GROWTH 3 DAYS Performed at [facility], [address], [place],  [address]    Report Status PENDING  Incomplete  Resp Panel by RT-PCR (Flu A&B, Covid) Nasopharyngeal Swab     Status: None   Collection Time: 05/28/21  1:49 PM   Specimen: Nasopharyngeal Swab; Nasopharyngeal(NP) swabs in vial transport medium  Result Value Ref Range Status   SARS Coronavirus 2 by RT PCR NEGATIVE NEGATIVE Final    Comment: (NOTE) SARS-CoV-2 target nucleic acids are NOT DETECTED.  The SARS-CoV-2 RNA is generally detectable in upper respiratory specimens during the acute phase of infection. The lowest concentration of SARS-CoV-2 viral copies this assay can detect is 138 copies/mL. A negative result does not preclude SARS-Cov-2 infection and should not be used as the sole basis for treatment or other patient management decisions. A negative result may occur with  improper specimen collection/handling, submission of specimen other than nasopharyngeal swab, presence of viral mutation(s) within  the areas targeted by this assay, and inadequate number of viral copies(<138 copies/mL). A negative result must be combined with clinical observations, patient history, and epidemiological information. The expected result is Negative.  Fact Sheet for  Patients:  EntrepreneurPulse.com.au  Fact Sheet for Healthcare Providers:  IncredibleEmployment.be  This test is no t yet approved or cleared by the [place] FDA and  has been authorized for detection and/or diagnosis of SARS-CoV-2 by FDA under an Emergency Use Authorization (EUA). This EUA will remain  in effect (meaning this test can be used) for the duration of the COVID-19 declaration under Section 564(b)(1) of the Act, 21 U.S.C.section 360bbb-3(b)(1), unless the authorization is terminated  or revoked sooner.       Influenza A by PCR NEGATIVE NEGATIVE Final   Influenza B by PCR NEGATIVE NEGATIVE Final    Comment: (NOTE) The Xpert Xpress SARS-CoV-2/FLU/RSV plus assay is intended as an aid in the diagnosis of influenza from Nasopharyngeal swab specimens and should not be used as a sole basis for treatment. Nasal washings and aspirates are unacceptable for Xpert Xpress SARS-CoV-2/FLU/RSV testing.  Fact Sheet for Patients: EntrepreneurPulse.com.au  Fact Sheet for Healthcare Providers: IncredibleEmployment.be  This test is not yet approved or cleared by the [place] FDA and has been authorized for detection and/or diagnosis of SARS-CoV-2 by FDA under an Emergency Use Authorization (EUA). This EUA will remain in effect (meaning this test can be used) for the duration of the COVID-19 declaration under Section 564(b)(1) of the Act, 21 U.S.C. section 360bbb-3(b)(1), unless the authorization is terminated or revoked.  Performed at [facility], [address] [place], [place] [address]        Radiology Studies: No results found.    Scheduled Meds:  carvedilol  3.125 mg Oral BID WC   cholecalciferol  2,000 Units Oral Daily   [START ON 06/02/2021] dapagliflozin propanediol  10 mg Oral Daily   finasteride  5 mg Oral Daily   furosemide  20 mg Oral Daily   multivitamin with minerals  1 tablet  Oral Daily   potassium chloride  10 mEq Oral Daily   pravastatin  40 mg Oral QHS   sacubitril-valsartan  1 tablet Oral BID   sodium chloride flush  3 mL Intravenous Q12H   sodium chloride flush  3 mL Intravenous Q12H   spironolactone  12.5 mg Oral Daily   Continuous Infusions:  sodium chloride     ceFEPime (MAXIPIME) IV Stopped (06/01/21 7209)   heparin 1,900 Units/hr (06/01/21 0945)   vancomycin 1,000 mg (06/01/21 0451)     LOS: 4 days      Time spent: 20 minutes   Dessa Phi, DO [facility] 06/01/2021, 10:46 AM   Available via Epic secure chat 7am-7pm After these hours, please refer to coverage provider listed on [email or web address]

## 2021-06-01 NOTE — Progress Notes (Signed)
[facility] for Infectious Disease  Date of Admission:  05/28/2021      Total days of antibiotics 5  Vancomycin + Cefepime 7/14 >> current          ASSESSMENT: [name] [name] is a 65 y.o. male sent to [facility] for admission to assist with further work up of of abnormal CT of the chest that was obtained for routine follow up of numerous pulmonary nodules seen May 2022. 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 It seems that this would be a very rare occurrence but could be a potential complication of right sided endocarditis. Blood cultures are negative from 7/14 - no fevers or systemic symptoms prior to admission and all of these findings found with routine imaging to follow up on other processes.   Will follow up TEE results and pending blood cultures.  FU vasculitis labs.    PLAN: Follow up pending studies.  Can continue IV vancomycin + cefepime but may be reasonable to stop them given negative BCx now approaching day 4.    Principal Problem:   Possible Endocarditis- w/u in Progress Active Problems:   Atrial fibrillation (HCC)   Chronic systolic heart failure (HCC)   Implantable cardioverter-defibrillator (ICD) in situ   Cardiomyopathy, nonischemic (HCC)   Essential hypertension, benign   Chronic anticoagulation   Pulmonary embolism (HCC)   Septic embolism (HCC)   Mycotic aneurysm (HCC)    carvedilol  3.125 mg Oral BID WC   cholecalciferol  2,000 Units Oral Daily   [START ON 06/02/2021] dapagliflozin propanediol  10 mg Oral Daily   finasteride  5 mg Oral Daily   furosemide  20 mg Oral Daily   multivitamin with minerals  1 tablet Oral Daily   potassium chloride  10 mEq Oral Daily   pravastatin  40 mg Oral QHS   sacubitril-valsartan  1 tablet Oral BID   sodium chloride flush  3 mL Intravenous Q12H   sodium chloride flush  3 mL Intravenous Q12H   spironolactone   12.5 mg Oral Daily    SUBJECTIVE: [name] [name] is feeling well today and has no complaints.    Review of Systems: Review of Systems  Constitutional:  Negative for chills and fever.  HENT:  Negative for tinnitus.   Eyes:  Negative for blurred vision and photophobia.  Respiratory:  Negative for cough and sputum production.   Cardiovascular:  Negative for chest pain and leg swelling.  Gastrointestinal:  Negative for diarrhea, nausea and vomiting.  Genitourinary:  Negative for dysuria.  Musculoskeletal:  Negative for back pain and neck pain.  Skin:  Negative for rash.  Neurological:  Negative for headaches.  All other systems reviewed and are negative.   No Known Allergies  OBJECTIVE: Vitals:   05/31/21 1320 05/31/21 2005 06/01/21 0455 06/01/21 0938  BP: 114/81 109/71 108/66 111/67  Pulse: 79 86  79  Resp:  18 16 16   Temp: 98.4 F (36.9 C) 98.4 F (36.9 C) 98.2 F (36.8 C) 98.2 F (36.8 C)  TempSrc: Oral Oral Oral Oral  SpO2: 100% 100% 98% 96%  Weight:   104.5 kg   Height:       Body mass index is 28.05 kg/m.  Physical Exam Vitals reviewed.  Constitutional:      Comments: Resting in bed quietly watching TV.  HENT:     Mouth/Throat:  Mouth: No oral lesions.     Dentition: Normal dentition. No dental caries.  Eyes:     General: No scleral icterus. Cardiovascular:     Rate and Rhythm: Normal rate and regular rhythm.     Heart sounds: Normal heart sounds.  Pulmonary:     Effort: Pulmonary effort is normal.     Breath sounds: Normal breath sounds.  Abdominal:     General: There is no distension.     Palpations: Abdomen is soft.     Tenderness: There is no abdominal tenderness.  Lymphadenopathy:     Cervical: No cervical adenopathy.  Skin:    General: Skin is warm and dry.     Findings: No rash.  Neurological:     Mental Status: He is alert and oriented to person, place, and time.     Lab Results Lab Results  Component Value Date   WBC 4.4  06/01/2021   HGB 12.8 (L) 06/01/2021   HCT 37.8 (L) 06/01/2021   MCV 88.7 06/01/2021   PLT 176 06/01/2021    Lab Results  Component Value Date   CREATININE 1.13 06/01/2021   BUN 14 05/31/2021   NA 135 05/31/2021   K 4.7 05/31/2021   CL 106 05/31/2021   CO2 22 05/31/2021    Lab Results  Component Value Date   ALT 11 05/31/2021   AST 15 05/31/2021   ALKPHOS 43 05/31/2021   BILITOT 0.6 05/31/2021     Microbiology: Recent Results (from the past 240 hour(s))  Urine Culture     Status: Abnormal   Collection Time: 05/28/21  1:17 PM   Specimen: Urine, Clean Catch  Result Value Ref Range Status   Specimen Description   Final    URINE, CLEAN CATCH Performed at [facility], [address], [place], [place] [address]    Special Requests   Final    NONE Performed at [facility], [address] [place], [place] [address]    Culture (A)  Final    <10,000 COLONIES/mL INSIGNIFICANT GROWTH Performed at [facility], [place] [address] [place], [place] [address]    Report Status 05/30/2021 FINAL  Final  Blood Culture (routine x 2)     Status: None (Preliminary result)   Collection Time: 05/28/21  1:39 PM   Specimen: Right Antecubital; Blood  Result Value Ref Range Status   Specimen Description   Final    RIGHT ANTECUBITAL BOTTLES DRAWN AEROBIC AND ANAEROBIC   Special Requests Blood Culture adequate volume  Final   Culture   Final    NO GROWTH 4 DAYS Performed at [facility], [address] [place], [place] [address]    Report Status PENDING  Incomplete  Blood Culture (routine x 2)     Status: None (Preliminary result)   Collection Time: 05/28/21  1:39 PM   Specimen: Left Antecubital; Blood  Result Value Ref Range Status   Specimen Description   Final    LEFT ANTECUBITAL BOTTLES DRAWN AEROBIC AND ANAEROBIC   Special Requests Blood Culture adequate volume  Final   Culture   Final    NO GROWTH 4 DAYS Performed at [facility], [address] [place], [place] [address]     Report Status PENDING  Incomplete  Resp Panel by RT-PCR (Flu A&B, Covid) Nasopharyngeal Swab     Status: None   Collection Time: 05/28/21  1:49 PM   Specimen: Nasopharyngeal Swab; Nasopharyngeal(NP) swabs in vial transport medium  Result Value Ref Range  Status   SARS Coronavirus 2 by RT PCR NEGATIVE NEGATIVE Final    Comment: (NOTE) SARS-CoV-2 target nucleic acids are NOT DETECTED.  The SARS-CoV-2 RNA is generally detectable in upper respiratory specimens during the acute phase of infection. The lowest concentration of SARS-CoV-2 viral copies this assay can detect is 138 copies/mL. A negative result does not preclude SARS-Cov-2 infection and should not be used as the sole basis for treatment or other patient management decisions. A negative result may occur with  improper specimen collection/handling, submission of specimen other than nasopharyngeal swab, presence of viral mutation(s) within the areas targeted by this assay, and inadequate number of viral copies(<138 copies/mL). A negative result must be combined with clinical observations, patient history, and epidemiological information. The expected result is Negative.  Fact Sheet for Patients:  EntrepreneurPulse.com.au  Fact Sheet for Healthcare Providers:  IncredibleEmployment.be  This test is no t yet approved or cleared by the [place] FDA and  has been authorized for detection and/or diagnosis of SARS-CoV-2 by FDA under an Emergency Use Authorization (EUA). This EUA will remain  in effect (meaning this test can be used) for the duration of the COVID-19 declaration under Section 564(b)(1) of the Act, 21 U.S.C.section 360bbb-3(b)(1), unless the authorization is terminated  or revoked sooner.       Influenza A by PCR NEGATIVE NEGATIVE Final   Influenza B by PCR NEGATIVE NEGATIVE Final    Comment: (NOTE) The Xpert Xpress SARS-CoV-2/FLU/RSV plus assay is intended as an aid in the  diagnosis of influenza from Nasopharyngeal swab specimens and should not be used as a sole basis for treatment. Nasal washings and aspirates are unacceptable for Xpert Xpress SARS-CoV-2/FLU/RSV testing.  Fact Sheet for Patients: EntrepreneurPulse.com.au  Fact Sheet for Healthcare Providers: IncredibleEmployment.be  This test is not yet approved or cleared by the [place] FDA and has been authorized for detection and/or diagnosis of SARS-CoV-2 by FDA under an Emergency Use Authorization (EUA). This EUA will remain in effect (meaning this test can be used) for the duration of the COVID-19 declaration under Section 564(b)(1) of the Act, 21 U.S.C. section 360bbb-3(b)(1), unless the authorization is terminated or revoked.  Performed at [facility], [address] [place], [place] [address]      Janene Madeira, MSN, NP-C [facility] for Infectious Disease [email or web address] Pager: [phone] Office: [place]: [phone]

## 2021-06-01 NOTE — Progress Notes (Signed)
Pharmacy Antibiotic Note  [name] [name] is a 65 y.o. 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 Pharmacy was consulted for vancomycin and cefepime dosing.   WBC 4.4, afebrile; Scr 1.113, CrCl ~89 ml/min (renal function stable)   Steady-state vancomycin levels drawn prior to 8th dose of vancomycin 1 gm IV Q 12  hrs regimen (last dose at 0451 AM today) were as follows: Vancomycin peak, drawn at 0808 today: 33 mg/L Vancomycin trough, drawn at 1506 today: 22 mg/L Vancomycin AUC based on these levels is 670, which is above the goal range of 400-550)  Plan:  Change vancomycin to 1500 mg IV Q 24 hrs (pt already rec'd vancomycin 1 gm dose this evening, so will start new regimen with next dose) Continue cefepime 2 gm IV Q 8 hrs Monitor WBC, temp, clinical improvement, cultures, renal function, vancomycin levels  Height: 6\' 4"  (193 cm) Weight: 104.5 kg (230 lb 6.4 oz) IBW/kg (Calculated) : 86.8  Temp (24hrs), Avg:98.3 F (36.8 C), Min:98.2 F (36.8 C), Max:98.4 F (36.9 C)  Recent Labs  Lab 05/28/21 1339 05/28/21 1350 05/29/21 0142 05/29/21 0201 05/30/21 0247 05/31/21 0118 05/31/21 1010 05/31/21 1752 06/01/21 0616 06/01/21 0808 06/01/21 1506  WBC  --  4.8 5.3  --  4.0 3.8*  --   --  4.4  --   --   CREATININE  --  1.36*  --  1.22  --   --  1.12  --  1.13  --   --   LATICACIDVEN 1.4  --   --   --   --   --   --   --   --   --   --   VANCOTROUGH  --   --   --   --   --   --   --   --   --   --  22*  VANCOPEAK  --   --   --   --   --   --   --  41*  --  33  --      Estimated Creatinine Clearance: 88.9 mL/min (by C-G formula based on SCr of 1.13 mg/dL).    No Known Allergies  Antimicrobials this admission: Vanco 7/14 >>  Cefepime 7/14 >>  Metronidazole 7/14 X 1  Microbiology results: 7/14 Bcx X 2: NG at 4 days 7/14 UCx: < 10,000 colonies/ml insignificant  growth 7/14 COVID, flu A, flu B, HIV screen: negative  Thank you for allowing pharmacy to be a part of this patient's care.  Gillermina Hu, PharmD, BCPS, [facility] Clinical Pharmacist 06/01/2021 6:19 PM

## 2021-06-02 ENCOUNTER — Inpatient Hospital Stay (HOSPITAL_COMMUNITY): Payer: Medicare HMO

## 2021-06-02 ENCOUNTER — Encounter (HOSPITAL_COMMUNITY)
Admission: EM | Disposition: A | Discharge status: Home / Self Care | Payer: Self-pay | Source: Home / Self Care | Attending: Internal Medicine

## 2021-06-02 ENCOUNTER — Encounter (HOSPITAL_COMMUNITY): Payer: Self-pay | Admitting: Family Medicine

## 2021-06-02 DIAGNOSIS — I313 Pericardial effusion (noninflammatory): Secondary | ICD-10-CM

## 2021-06-02 DIAGNOSIS — I281 Aneurysm of pulmonary artery: Secondary | ICD-10-CM

## 2021-06-02 DIAGNOSIS — I272 Pulmonary hypertension, unspecified: Secondary | ICD-10-CM

## 2021-06-02 DIAGNOSIS — I340 Nonrheumatic mitral (valve) insufficiency: Secondary | ICD-10-CM

## 2021-06-02 DIAGNOSIS — I4811 Longstanding persistent atrial fibrillation: Secondary | ICD-10-CM | POA: Diagnosis not present

## 2021-06-02 DIAGNOSIS — I428 Other cardiomyopathies: Secondary | ICD-10-CM | POA: Diagnosis not present

## 2021-06-02 DIAGNOSIS — I339 Acute and subacute endocarditis, unspecified: Secondary | ICD-10-CM

## 2021-06-02 DIAGNOSIS — I361 Nonrheumatic tricuspid (valve) insufficiency: Secondary | ICD-10-CM

## 2021-06-02 DIAGNOSIS — I5023 Acute on chronic systolic (congestive) heart failure: Secondary | ICD-10-CM

## 2021-06-02 DIAGNOSIS — Z7901 Long term (current) use of anticoagulants: Secondary | ICD-10-CM | POA: Diagnosis not present

## 2021-06-02 HISTORY — PX: TEE WITHOUT CARDIOVERSION: SHX5443

## 2021-06-02 LAB — CBC
HCT: 38.9 % — ABNORMAL LOW (ref 39.0–52.0)
Hemoglobin: 12.8 g/dL — ABNORMAL LOW (ref 13.0–17.0)
MCH: 29.5 pg (ref 26.0–34.0)
MCHC: 32.9 g/dL (ref 30.0–36.0)
MCV: 89.6 fL (ref 80.0–100.0)
Platelets: 158 10*3/uL (ref 150–400)
RBC: 4.34 MIL/uL (ref 4.22–5.81)
RDW: 14.6 % (ref 11.5–15.5)
WBC: 5 10*3/uL (ref 4.0–10.5)
nRBC: 0 % (ref 0.0–0.2)

## 2021-06-02 LAB — HEPARIN LEVEL (UNFRACTIONATED): Heparin Unfractionated: 0.21 IU/mL — ABNORMAL LOW (ref 0.30–0.70)

## 2021-06-02 LAB — CULTURE, BLOOD (ROUTINE X 2)
Culture: NO GROWTH
Culture: NO GROWTH
Special Requests: ADEQUATE
Special Requests: ADEQUATE

## 2021-06-02 SURGERY — ECHOCARDIOGRAM, TRANSESOPHAGEAL
Anesthesia: Monitor Anesthesia Care

## 2021-06-02 MED ORDER — PHENYLEPHRINE 40 MCG/ML (10ML) SYRINGE FOR IV PUSH (FOR BLOOD PRESSURE SUPPORT)
PREFILLED_SYRINGE | INTRAVENOUS | Status: DC | PRN
  Administered 2021-06-02: 120 ug via INTRAVENOUS
  Administered 2021-06-02: 80 ug via INTRAVENOUS
  Administered 2021-06-02: 120 ug via INTRAVENOUS
  Administered 2021-06-02: 80 ug via INTRAVENOUS
  Administered 2021-06-02: 120 ug via INTRAVENOUS
  Administered 2021-06-02: 80 ug via INTRAVENOUS

## 2021-06-02 MED ORDER — SODIUM CHLORIDE 0.9% FLUSH
3.0000 mL | Freq: Two times a day (BID) | INTRAVENOUS | Status: DC
  Administered 2021-06-03: 3 mL via INTRAVENOUS

## 2021-06-02 MED ORDER — WARFARIN - PHARMACIST DOSING INPATIENT: Freq: Every day | Status: DC

## 2021-06-02 MED ORDER — SODIUM CHLORIDE 0.9 % IV SOLN
INTRAVENOUS | Status: AC | PRN
  Administered 2021-06-02: 500 mL via INTRAMUSCULAR

## 2021-06-02 MED ORDER — ALBUMIN HUMAN 5 % IV SOLN
INTRAVENOUS | Status: AC
  Filled 2021-06-02: qty 250

## 2021-06-02 MED ORDER — FUROSEMIDE 10 MG/ML IJ SOLN
40.0000 mg | Freq: Two times a day (BID) | INTRAMUSCULAR | Status: DC
  Administered 2021-06-02 – 2021-06-03 (×2): 40 mg via INTRAVENOUS
  Filled 2021-06-02 (×2): qty 4

## 2021-06-02 MED ORDER — FUROSEMIDE 10 MG/ML IJ SOLN: 40.0000 mg | Freq: Once | INTRAMUSCULAR | Status: DC

## 2021-06-02 MED ORDER — ENOXAPARIN SODIUM 100 MG/ML IJ SOSY
100.0000 mg | PREFILLED_SYRINGE | Freq: Two times a day (BID) | INTRAMUSCULAR | Status: DC
  Administered 2021-06-02: 100 mg via SUBCUTANEOUS
  Filled 2021-06-02: qty 1

## 2021-06-02 MED ORDER — ALBUMIN HUMAN 5 % IV SOLN
12.5000 g | Freq: Once | INTRAVENOUS | Status: AC
  Administered 2021-06-02: 12.5 g via INTRAVENOUS

## 2021-06-02 MED ORDER — WARFARIN SODIUM 7.5 MG PO TABS: 7.5000 mg | ORAL_TABLET | Freq: Once | ORAL | Status: DC

## 2021-06-02 MED ORDER — HEPARIN (PORCINE) 25000 UT/250ML-% IV SOLN
2100.0000 [IU]/h | INTRAVENOUS | Status: DC
  Administered 2021-06-03: 2100 [IU]/h via INTRAVENOUS
  Filled 2021-06-02: qty 250

## 2021-06-02 MED ORDER — ENOXAPARIN (LOVENOX) PATIENT EDUCATION KIT
PACK | Freq: Once | Status: DC
  Filled 2021-06-02: qty 1

## 2021-06-02 MED ORDER — LIDOCAINE 2% (20 MG/ML) 5 ML SYRINGE
INTRAMUSCULAR | Status: DC | PRN
  Administered 2021-06-02: 40 mg via INTRAVENOUS

## 2021-06-02 MED ORDER — PROPOFOL 500 MG/50ML IV EMUL
INTRAVENOUS | Status: DC | PRN
  Administered 2021-06-02: 75 ug/kg/min via INTRAVENOUS

## 2021-06-02 MED ORDER — HEPARIN BOLUS VIA INFUSION
2000.0000 [IU] | Freq: Once | INTRAVENOUS | Status: AC
  Administered 2021-06-02: 2000 [IU] via INTRAVENOUS
  Filled 2021-06-02: qty 2000

## 2021-06-02 MED ORDER — PROPOFOL 10 MG/ML IV BOLUS
INTRAVENOUS | Status: DC | PRN
  Administered 2021-06-02: 10 mg via INTRAVENOUS
  Administered 2021-06-02 (×2): 20 mg via INTRAVENOUS
  Administered 2021-06-02: 10 mg via INTRAVENOUS

## 2021-06-02 MED ORDER — ALBUMIN HUMAN 25 % IV SOLN: 12.5000 g | Freq: Once | INTRAVENOUS | Status: DC

## 2021-06-02 NOTE — Consult Note (Addendum)
Advanced Heart Failure Team Consult Note   Primary Physician: Glenda Chroman, MD PCP-Cardiologist:  Rozann Lesches, MD  Reason for Consultation: Heart Failure   HPI:    [name] [name] is seen today for evaluation of heart failure at the request of Dr Stanford Breed.   [name] [name] is a 65 year old with a history of anemia, permanent a fib, cardiac arrest 1999, Medtronic  ICD, NICM, HTN, and combined diastolic/systolic heart failure . He is a poor historian. I have obtained history from the chart.   Presented to [facility]  due to abdnormal CT scan.. Had CT chest/abd ordered by Urology and due tto abnormal results was asked to go to ED. CT chest showed multiple aneurysms in his pulmonary arterial bed. PCCM consulted and felt like this was probably septic emboli  with pulmonary septic emboli.  Started on heparin drip and vanc/cefepime/metronidazone. Lower extremity dopplers negative for DVT. Transferred to [name] for further management.   ID consulted 05/29/21. Recommendations to continue antibiotics, repeat bld cx, and check ANA, ANCA, ESR, D Dimer, TEE, and anticoagulations.    ANA negative , Sed rate 58>63, D-Dimer 3.92 , ANCA Titers < 1:20 , CRP 9.6 , HIV negative.   TEE completed today and was negative for vegetation, RV with moderate RV dysfunction, and moderate pericardial effusion.   05/28/21 Blood cultures - NGTD negative.  05/28/21 Urine cultures- no significant growth  7/14 SARS negative.    SH:He does not drive. Lives with 2 brothers.  Disabled. He does not drink alcohol or smoke.   Home Medications Prior to Admission medications   Medication Sig Start Date End Date Taking? Authorizing Provider  carvedilol (COREG) 25 MG tablet TAKE 2 TABLETS (50 MG TOTAL) BY MOUTH 2 (TWO) TIMES DAILY WITH A MEAL. *DOSE INCREASE Patient taking differently: Take 50 mg by mouth 2 (two) times daily with a meal. 05/26/21  Yes Satira Sark, MD  Cholecalciferol (VITAMIN D3) 2000 UNITS capsule Take  2,000 Units by mouth daily.   Yes [provider]  dapagliflozin propanediol (FARXIGA) 10 MG TABS tablet Take 1 tablet (10 mg total) by mouth daily before breakfast. 04/21/21  Yes Satira Sark, MD  ELIQUIS 5 MG TABS tablet TAKE 1 TABLET BY MOUTH TWICE A DAY 12/24/20  Yes Satira Sark, MD  ENTRESTO 24-26 MG TAKE 1 TABLET BY MOUTH TWICE A DAY 04/21/21  Yes Satira Sark, MD  finasteride (PROSCAR) 5 MG tablet TAKE 1 TABLET BY MOUTH EVERY DAY 01/11/21  Yes Irine Seal, MD  furosemide (LASIX) 20 MG tablet Take 1 tablet (20 mg total) by mouth daily. 04/21/21  Yes Satira Sark, MD  polyethylene glycol powder (GLYCOLAX/MIRALAX) powder Take 17 g by mouth daily as needed. For constipation - mix with 8 oz liquid and drink 04/19/12  Yes [provider]  potassium chloride (MICRO-K) 10 MEQ CR capsule Take 1 capsule (10 mEq total) by mouth daily. 02/24/12  Yes de Stanford Scotland, MD  pravastatin (PRAVACHOL) 40 MG tablet Take 40 mg by mouth daily.   Yes [provider]  spironolactone (ALDACTONE) 25 MG tablet TAKE 1/2 TABLET BY MOUTH EVERY DAY 06/09/20  Yes Satira Sark, MD    Past Medical History: Past Medical History:  Diagnosis Date   Anemia    Atrial fibrillation ([place])    AVM (arteriovenous malformation)    Duodenum - nonbleeding and EGD 11/13   Cardiac arrest ([place]) 1998   ICD implanted at [facility]  Cardiomyopathy, nonischemic (HCC)    LVEF 38-93%   Chronic systolic heart failure (HCC)    Essential hypertension    History of colonic polyps    Colonoscopy 11/13   Pneumonia    Severe with respiratory failure 10/13    Past Surgical History: Past Surgical History:  Procedure Laterality Date   [place]   COLONOSCOPY  09/26/2012   Procedure: COLONOSCOPY;  Surgeon: Beryle Beams, MD;  Location: [place];  Service: Endoscopy;  Laterality: N/A;   Defibrillator system revision  04/12/12   MDT Protecta XT VR implanted at [facility] by  Dr Deno Etienne with previously implanted system and leads extracted due to RV lead failure   ESOPHAGOGASTRODUODENOSCOPY  09/25/2012   Procedure: ESOPHAGOGASTRODUODENOSCOPY (EGD);  Surgeon: Beryle Beams, MD;  Location: [facility] ENDOSCOPY;  Service: Endoscopy;  Laterality: N/A;    Family History: Family History  Problem Relation Age of Onset   Diabetes Mother        Died @ 14.   Hypertension Father        Alive @ 43.   Alzheimer's disease Father    Diabetes Brother        Brother also has htn   Hypertension Brother     Social History: Social History   Socioeconomic History   Marital status: Single    Spouse name: Not on file   Number of children: Not on file   Years of education: Not on file   Highest education level: Not on file  Occupational History   Occupation: DIABLED  Tobacco Use   Smoking status: Former    Packs/day: 0.80    Years: 6.00    Pack years: 4.80    Types: Cigarettes    Start date: 05/13/1978    Quit date: 11/15/1996    Years since quitting: 24.5   Smokeless tobacco: Never  Vaping Use   Vaping Use: Never used  Substance and Sexual Activity   Alcohol use: No    Alcohol/week: 0.0 standard drinks   Drug use: Not Currently    Types: Marijuana    Comment: Many years ago   Sexual activity: Never  Other Topics Concern   Not on file  Social History Narrative   Lives in [place] with his father and 2 brothers.   Social Determinants of Health   Financial Resource Strain: Not on file  Food Insecurity: Not on file  Transportation Needs: Not on file  Physical Activity: Not on file  Stress: Not on file  Social Connections: Not on file    Allergies:  No Known Allergies  Objective:    Vital Signs:   Temp:  [98.2 F (36.8 C)-99 F (37.2 C)] 98.5 F (36.9 C) (07/19 1057) Pulse Rate:  [77-116] 103 (07/19 1057) Resp:  [15-32] 20 (07/19 1057) BP: (79-117)/(44-72) 102/63 (07/19 1057) SpO2:  [93 %-100 %] 93 % (07/19 1057) Weight:  [106.7 kg] 106.7 kg (07/19  0617) Last BM Date: 06/01/21  Weight change: Filed Weights   05/31/21 0501 06/01/21 0455 06/02/21 0617  Weight: 104.5 kg 104.5 kg 106.7 kg    Intake/Output:   Intake/Output Summary (Last 24 hours) at 06/02/2021 1255 Last data filed at 06/02/2021 0941 Gross per 24 hour  Intake 1566.2 ml  Output 825 ml  Net 741.2 ml      Physical Exam    General:   No resp difficulty HEENT: normal Neck: supple. JVP 10-11  Carotids 2+ bilat; no bruits. No lymphadenopathy or thyromegaly appreciated.  Cor: PMI nondisplaced. Irregular rate & rhythm. No rubs, gallops or murmurs. Lungs: clear Abdomen: soft, nontender, nondistended. No hepatosplenomegaly. No bruits or masses. Good bowel sounds. Extremities: no cyanosis, clubbing, rash, edema Neuro: alert & orientedx3, cranial nerves grossly intact. moves all 4 extremities w/o difficulty. Affect pleasant   Telemetry   A Fib   EKG    A Fib 85 bpm 05/28/21   Labs   Basic Metabolic Panel: Recent Labs  Lab 05/28/21 1350 05/29/21 0201 05/31/21 1010 06/01/21 0616  NA 134* 134* 135  --   K 4.6 4.1 4.7  --   CL 103 106 106  --   CO2 25 21* 22  --   GLUCOSE 98 116* 97  --   BUN $Re'21 20 14  'BzD$ --   CREATININE 1.36* 1.22 1.12 1.13  CALCIUM 9.3 9.3 10.0  --     Liver Function Tests: Recent Labs  Lab 05/28/21 1350 05/31/21 1010  AST 10* 15  ALT 10 11  ALKPHOS 50 43  BILITOT 0.7 0.6  PROT 8.0 7.3  ALBUMIN 3.6 3.1*   No results for input(s): LIPASE, AMYLASE in the last 168 hours. No results for input(s): AMMONIA in the last 168 hours.  CBC: Recent Labs  Lab 05/28/21 1350 05/29/21 0142 05/30/21 0247 05/31/21 0118 06/01/21 0616 06/02/21 0131  WBC 4.8 5.3 4.0 3.8* 4.4 5.0  NEUTROABS 3.2  --   --   --   --   --   HGB 12.8* 12.4* 13.3 12.5* 12.8* 12.8*  HCT 39.1 37.2* 39.3 37.8* 37.8* 38.9*  MCV 91.8 89.2 87.9 89.4 88.7 89.6  PLT 175 161 189 179 176 158    Cardiac Enzymes: No results for input(s): CKTOTAL, CKMB, CKMBINDEX,  TROPONINI in the last 168 hours.  BNP: BNP (last 3 results) No results for input(s): BNP in the last 8760 hours.  ProBNP (last 3 results) No results for input(s): PROBNP in the last 8760 hours.   CBG: No results for input(s): GLUCAP in the last 168 hours.  Coagulation Studies: No results for input(s): LABPROT, INR in the last 72 hours.   Imaging   No results found.   Medications:     Current Medications:  carvedilol  3.125 mg Oral BID WC   cholecalciferol  2,000 Units Oral Daily   dapagliflozin propanediol  10 mg Oral Daily   enoxaparin (LOVENOX) injection  100 mg Subcutaneous Q12H   enoxaparin   Does not apply Once   finasteride  5 mg Oral Daily   furosemide  20 mg Oral Daily   multivitamin with minerals  1 tablet Oral Daily   potassium chloride  10 mEq Oral Daily   pravastatin  40 mg Oral QHS   sacubitril-valsartan  1 tablet Oral BID   sodium chloride flush  3 mL Intravenous Q12H   sodium chloride flush  3 mL Intravenous Q12H   spironolactone  12.5 mg Oral Daily   warfarin  7.5 mg Oral ONCE-1600   Warfarin - Pharmacist Dosing Inpatient   Does not apply q1600    Infusions:  sodium chloride        Assessment/Plan   Possible Endocarditis -Had septic  embolism.  -TEE negative for vegetation - Bld CX- NGTD - On antibiotics--> now stopped.   2. Permanent A fib  -Rate controlled  -On coumadin -->Switch to heparin drip for cath.   3. Chronic systolic Heart Failure, ICD Medtronic  - EF has been down since 1999. ECHO this admit  35-40% RV mildly reduced and RA 15 -TEE EF 40% - no vegetation  -Volume status elevated. Give 40 mg IV lasix twice a day.  -On spiro 12.5 mg daily and entresto.  - Renal function stable.   4. Possible Pulmonary Embolisim - on anticoagulation  - Needs VQ scan   5. Pulmonary Hypertension  Elevated PA pressure >70s  - ? Chronic PEs. - VQ scan   6. Aneurysms   Length of Stay: [place], NP  06/02/2021, 12:55  PM  Advanced Heart Failure Team Pager [phone] (M-F; 7a - 5p)  Please contact [facility] for night-coverage after hours (4p -7a ) and weekends on [email or web address]  Patient seen with NP, agree with the above note.   Patient has a long history of cardiomyopathy.  He had a Medtronic ICD placed in 1998 after cardiac arrest.  Echoes for many years have shown EF around 35% with RV dysfunction.  He has not had a cath or stress test in our system but is described as having a nonischemic CMP.    He had a CT to followup on pulmonary nodules showing multifocal segmental pulmonary artery aneurysms and small segmental pulmonary emboli.  Initially, this was thought to be mycotic aneurysms and there was concern for endocarditis.  However, TEE showed no endocarditis and blood cultures were negative.  Abx were stopped.   Echo was reviewed, EF 35-40%, D-shaped IV septum with mildly decreased RV systolic function and moderate RVE, moderate pericardial effusion, severe TR, dilated IVC, PASP 74 mmHg.   General: NAD Neck: JVP 10-12 cm, no thyromegaly or thyroid nodule.  Lungs: Clear to auscultation bilaterally with normal respiratory effort. CV: Nondisplaced PMI.  Heart irregular S1/S2, no S3/S4, no murmur.  No peripheral edema.   Abdomen: Soft, nontender, no hepatosplenomegaly, no distention.  Skin: Intact without lesions or rashes.  Neurologic: Alert and oriented x 3.  Psych: Normal affect. Extremities: No clubbing or cyanosis.  HEENT: Normal.   1. Acute on chronic systolic CHF with prominent RV failure: Cardiomyopathy x years. 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 Said to have NICM but Echo showed EF 35-40%, D-shaped IV septum with mildly decreased RV systolic function and moderate RVE, moderate pericardial effusion, severe TR, dilated IVC, PASP 74 mmHg. Seems to have predominant RV failure.  Volume overloaded on exam.  - Lasix 40 mg IV bid.  - Continue Coreg,  Entresto, spironolactone, dapagliflozin.  - Will plan RHC/LHC tomorrow to assess coronaries and also PA pressure.  Discussed risks/benefits with patient, he agrees to procedure.  - I suspect that is ICD is not MRI compatible (old model) so probably will not be able to get cardiac MRI.  2. Pulmonary hypertension: PA systolic pressure elevated on RHC, RV failure with severe TR.  Cause of PH + RV failure uncertain.  Patient has segmental PA aneurysms (see below).  ?Chronic PEs => CTA looked like possible small segmental PEs.  - RHC as above to assess filling pressures/PA pressure.  - V/Q scan rule out chronic PEs.  3. Multiple segmental pulmonary artery aneurysms: Noted on CTA prior to admission.  Cause is uncertain.  Initially thought to be mycotic aneurysm, but TEE showed no endocarditis and blood cultures negative.  I asked Dr. Carlis Abbott with pulmonary to review the CTA, he may have small PEs but this does not appear to explain the pulmonary aneurysms.  ?Inflammatory disease such as Behcet's syndrome with elevated ESR and CRP.  He does not have a history of mucocutaneous ulcers or other findings suggestive of Behcets.   - I have asked pulmonary to formally consult.  - V/Q scan to assess for evidence for chronic PEs.  - RHC to assess PA pressure as above.  4. Atrial fibrillation: Chronic/permanent.  - Plan for heparin/Lovenox => warfarin bridge.  Will hold heparin tomorrow for cath.  5. PE: Possible small segmental PEs on CTA.  - Anticoagulation as above.   Loralie Champagne 06/02/2021 5:53 PM

## 2021-06-02 NOTE — Progress Notes (Signed)
  Echocardiogram Echocardiogram Transesophageal has been performed.  [name] [name] 06/02/2021, 10:06 AM

## 2021-06-02 NOTE — Transfer of Care (Signed)
Immediate Anesthesia Transfer of Care Note  Patient: [name] [name]  Procedure(s) Performed: TRANSESOPHAGEAL ECHOCARDIOGRAM (TEE)  Patient Location: Endoscopy Unit  Anesthesia Type:MAC  Level of Consciousness: awake and alert   Airway & Oxygen Therapy: Patient Spontanous Breathing and Patient connected to face mask oxygen  Post-op Assessment: Report given to RN and Post -op Vital signs reviewed and stable  Post vital signs: Reviewed and stable  Last Vitals:  Vitals Value Taken Time  BP 85/53 06/02/21 1007  Temp    Pulse 108 06/02/21 1008  Resp 29 06/02/21 1008  SpO2 98 % 06/02/21 1008  Vitals shown include unvalidated device data.  Last Pain:  Vitals:   06/02/21 0846  TempSrc: Oral  PainSc: 0-No pain      Patients Stated Pain Goal: 0 (40/81/44 8185)  Complications: No notable events documented.

## 2021-06-02 NOTE — Interval H&P Note (Signed)
History and Physical Interval Note:  06/02/2021 9:23 AM  [name] [name]  has presented today for surgery, with the diagnosis of endocarditis.  The various methods of treatment have been discussed with the patient and family. After consideration of risks, benefits and other options for treatment, the patient has consented to  Procedure(s): TRANSESOPHAGEAL ECHOCARDIOGRAM (TEE) (N/A) as a surgical intervention.  The patient's history has been reviewed, patient examined, no change in status, stable for surgery.  I have reviewed the patient's chart and labs.  Questions were answered to the patient's satisfaction.     Kirk Ruths

## 2021-06-02 NOTE — Discharge Instructions (Addendum)
Information on my medicine - Coumadin   (Warfarin)  This medication education was reviewed with me or my healthcare representative as part of my discharge preparation.  The pharmacist that spoke with me during my hospital stay was:  Donnamae Jude, [facility]  Why was Coumadin prescribed for you? Coumadin was prescribed for you because you have a blood clot or a medical condition that can cause an increased risk of forming blood clots. Blood clots can cause serious health problems by blocking the flow of blood to the heart, lung, or brain. Coumadin can prevent harmful blood clots from forming. As a reminder your indication for Coumadin is:   Select from menu  What test will check on my response to Coumadin? While on Coumadin (warfarin) you will need to have an INR test regularly to ensure that your dose is keeping you in the desired range. The INR (international normalized ratio) number is calculated from the result of the laboratory test called prothrombin time (PT).  If an INR APPOINTMENT HAS NOT ALREADY BEEN MADE FOR YOU please schedule an appointment to have this lab work done by your health care provider within 7 days. Your INR goal is usually a number between:  2 to 3 or your provider may give you a more narrow range like 2-2.5.  Ask your health care provider during an office visit what your goal INR is.  What  do you need to  know  About  COUMADIN? Take Coumadin (warfarin) exactly as prescribed by your healthcare provider about the same time each day.  DO NOT stop taking without talking to the doctor who prescribed the medication.  Stopping without other blood clot prevention medication to take the place of Coumadin may increase your risk of developing a new clot or stroke.  Get refills before you run out.  What do you do if you miss a dose? If you miss a dose, take it as soon as you remember on the same day then continue your regularly scheduled regimen the next day.  Do not take two doses of  Coumadin at the same time.  Important Safety Information A possible side effect of Coumadin (Warfarin) is an increased risk of bleeding. You should call your healthcare provider right away if you experience any of the following: Bleeding from an injury or your nose that does not stop. Unusual colored urine (red or dark brown) or unusual colored stools (red or black). Unusual bruising for unknown reasons. A serious fall or if you hit your head (even if there is no bleeding).  Some foods or medicines interact with Coumadin (warfarin) and might alter your response to warfarin. To help avoid this: Eat a balanced diet, maintaining a consistent amount of Vitamin K. Notify your provider about major diet changes you plan to make. Avoid alcohol or limit your intake to 1 drink for women and 2 drinks for men per day. (1 drink is 5 oz. wine, 12 oz. beer, or 1.5 oz. liquor.)  Make sure that ANY health care provider who prescribes medication for you knows that you are taking Coumadin (warfarin).  Also make sure the healthcare provider who is monitoring your Coumadin knows when you have started a new medication including herbals and non-prescription products.  Coumadin (Warfarin)  Major Drug Interactions  Increased Warfarin Effect Decreased Warfarin Effect  Alcohol (large quantities) Antibiotics (esp. Septra/Bactrim, Flagyl, Cipro) Amiodarone (Cordarone) Aspirin (ASA) Cimetidine (Tagamet) Megestrol (Megace) NSAIDs (ibuprofen, naproxen, etc.) Piroxicam (Feldene) Propafenone (Rythmol SR) Propranolol (Inderal) Isoniazid (  INH) Posaconazole (Noxafil) Barbiturates (Phenobarbital) Carbamazepine (Tegretol) Chlordiazepoxide (Librium) Cholestyramine (Questran) Griseofulvin Oral Contraceptives Rifampin Sucralfate (Carafate) Vitamin K   Coumadin (Warfarin) Major Herbal Interactions  Increased Warfarin Effect Decreased Warfarin Effect  Garlic Ginseng Ginkgo biloba Coenzyme Q10 Green tea St.  [name]'s wort    Coumadin (Warfarin) FOOD Interactions  Eat a consistent number of servings per week of foods HIGH in Vitamin K (1 serving =  cup)  Collards (cooked, or boiled & drained) Kale (cooked, or boiled & drained) Mustard greens (cooked, or boiled & drained) Parsley *serving size only =  cup Spinach (cooked, or boiled & drained) Swiss chard (cooked, or boiled & drained) Turnip greens (cooked, or boiled & drained)  Eat a consistent number of servings per week of foods MEDIUM-HIGH in Vitamin K (1 serving = 1 cup)  Asparagus (cooked, or boiled & drained) Broccoli (cooked, boiled & drained, or raw & chopped) Brussel sprouts (cooked, or boiled & drained) *serving size only =  cup Lettuce, raw (green leaf, endive, romaine) Spinach, raw Turnip greens, raw & chopped   These websites have more information on Coumadin (warfarin):  FailFactory.se; VeganReport.com.au;  ===================================================================  Vitamin K Foods and Warfarin Warfarin is a blood thinner (anticoagulant). Anticoagulant medicines help prevent the formation of blood clots. These medicines work by decreasing the activity of vitamin K, which promotes normal blood clotting. When you take warfarin, problems can occur from suddenly increasing or decreasing the amount of vitamin K that you eat from one day to the next. Problems may include: Blood clots. Bleeding.  What general guidelines do I need to follow? To avoid problems when taking warfarin: Eat a balanced diet that includes: Fresh fruits and vegetables. Whole grains. Low-fat dairy products. Lean proteins, such as fish, eggs, and lean cuts of meat. Keep your intake of vitamin K consistent from day to day. To do this: Avoid eating large amounts of vitamin K one day and low amounts of vitamin K the next day. If you take a multivitamin that contains vitamin K, be sure to take it every day. Know which foods  contain vitamin K. Use the lists below to understand serving sizes and the amount of vitamin K in one serving. Avoid major changes in your diet. If you are going to change your diet, talk with your health care provider before making changes. Work with a Financial planner (dietitian) to develop a meal plan that works best for you.  High vitamin K foods Foods that are high in vitamin K contain more than 100 mcg (micrograms) per serving. These include: Broccoli (cooked) -  cup has 110 mcg. Brussels sprouts (cooked) -  cup has 109 mcg. Greens, beet (cooked) -  cup has 350 mcg. Greens, collard (cooked) -  cup has 418 mcg. Greens, turnip (cooked) -  cup has 265 mcg. Green onions or scallions -  cup has 105 mcg. Kale (fresh or frozen) -  cup has 531 mcg. Parsley (raw) - 10 sprigs has 164 mcg. Spinach (cooked) -  cup has 444 mcg. Swiss chard (cooked) -  cup has 287 mcg.  Moderate vitamin K foods Foods that have a moderate amount of vitamin K contain 25-100 mcg per serving. These include: Asparagus (cooked) - 5 spears have 38 mcg. Black-eyed peas (dried) -  cup has 32 mcg. Cabbage (cooked) -  cup has 37 mcg. Kiwi fruit - 1 medium has 31 mcg. Lettuce - 1 cup has 57-63 mcg. Okra (frozen) -  cup has 44 mcg. Prunes (dried) -  5 prunes have 25 mcg. Watercress (raw) - 1 cup has 85 mcg.  Low vitamin K foods Foods low in vitamin K contain less than 25 mcg per serving. These include: Artichoke - 1 medium has 18 mcg. Avocado - 1 oz. has 6 mcg. Blueberries -  cup has 14 mcg. Cabbage (raw) -  cup has 21 mcg. Carrots (cooked) -  cup has 11 mcg. Cauliflower (raw) -  cup has 11 mcg. Cucumber with peel (raw) -  cup has 9 mcg. Grapes -  cup has 12 mcg. Mango - 1 medium has 9 mcg. Nuts - 1 oz. has 15 mcg. Pear - 1 medium has 8 mcg. Peas (cooked) -  cup has 19 mcg. Pickles - 1 spear has 14 mcg. Pumpkin seeds - 1 oz. has 13 mcg. Sauerkraut (canned) -  cup has 16 mcg. Soybeans  (cooked) -  cup has 16 mcg. Tomato (raw) - 1 medium has 10 mcg. Tomato sauce -  cup has 17 mcg.  Vitamin K-free foods If a food contain less than 5 mcg per serving, it is considered to have no vitamin K. These foods include: Bread and cereal products. Cheese. Eggs. Fish and shellfish. Meat and poultry. Milk and dairy products. Sunflower seeds.  Actual amounts of vitamin K in foods may be different depending on processing. Talk with your dietitian about what foods you can eat and what foods you should avoid.  This information is not intended to replace advice given to you by your health care provider. Make sure you discuss any questions you have with your health care provider.  Document Revised: 10/14/2017 Document Reviewed: 02/04/2016 Elsevier Patient Education  2020 [employer].

## 2021-06-02 NOTE — Progress Notes (Signed)
ANTICOAGULATION CONSULT NOTE  Pharmacy Consult for heparin Indication: pulmonary embolus 05/25/21  No Known Allergies  Patient Measurements: Height: 6\' 4"  (193 cm) Weight: 104.5 kg (230 lb 6.4 oz) IBW/kg (Calculated) : 86.8 Heparin Dosing Weight: 105 kg  Vital Signs: Temp: 98.5 F (36.9 C) (07/19 0020) Temp Source: Oral (07/19 0020) BP: 104/66 (07/19 0020) Pulse Rate: 89 (07/19 0020)  Labs: Recent Labs    05/31/21 0118 05/31/21 1010 06/01/21 0616 06/02/21 0131  HGB 12.5*  --  12.8* 12.8*  HCT 37.8*  --  37.8* 38.9*  PLT 179  --  176 158  APTT 99*  --   --   --   HEPARINUNFRC 0.46  --  0.31 0.21*  CREATININE  --  1.12 1.13  --      Estimated Creatinine Clearance: 88.9 mL/min (by C-G formula based on SCr of 1.13 mg/dL).   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 Patient was on apixaban PTA for afib, last dose 7/14 ~0630. Pharmacy consulted for heparin.    Heparin level down to subtherapeutic (0.21) on gtt at 1900 units/hr. No issues with line or bleeding reported per RN.   Goal of Therapy:  Heparin level 0.3-0.7 units/ml Monitor platelets by anticoagulation protocol: Yes  Plan:  Rebolus heparin 2000 units Increase heparin gtt to 2100 units/hr F/u 6 hr heparin level  Sherlon Handing, PharmD, BCPS Please see amion for complete clinical pharmacist phone list 06/02/2021 3:02 AM

## 2021-06-02 NOTE — Consult Note (Signed)
NAME:  [name] [name], MRN:  [id number], DOB:  January 07, 1956, LOS: 5 ADMISSION DATE:  05/28/2021, CONSULTATION DATE:  06/02/21 REFERRING MD:  Aundra Dubin, CHIEF COMPLAINT:  pulmonary aneurysms  History of Present Illness:  Mr. Hunnell is a 65 y/o gentleman referred to the hospital for admission due to abnormal CT findings are concerning for endocarditis with mycotic pulmonary aneurysms.  He originally had a CT abdomen to evaluate urinary complaints.  This demonstrated nodules in his lungs.  Follow-up imaging with contrast demonstrated pulmonary arterial aneurysms and possibly small distal PEs.  He was admitted to the hospital due to concern for mycotic aneurysms and need for evaluation and treatment of possible endocarditis.  Throughout this admission his cultures have remained negative and TEE was unrevealing today.  He does not have symptoms suggestive of chronic infection such as fever, weight loss.  Pulmonology was asked to reconsult for these aneurysms. Previously consulted 7/14, and it was felt to be most likely mycotic aneurysms versus vasculitis.   He has no history of autoimmune disease.  He has a significant cardiac history.  He has had hemoptysis in the past, always associated with epistaxis and self-limited.  He does not regularly have bleeding from his gums or other places.  He has never had unexplained hemoptysis.  He has no pulmonary complaints.  He has no history of synovitis, rashes, muscle aches.  He has had a few ulcers at the outer edges of his mouth, but never intraoral ulcers.  He has never had genital ulcers that he is aware of.  No family or personal history of rheumatologic disease.  He does not endorse any uncontrolled symptoms currently.  Pertinent  Medical History  Afib Anemia Cardiac arrest in 1998 HFrEF, NICM HTN Quit smoking ~2000  Significant Hospital Events: Including procedures, antibiotic start and stop dates in addition to other pertinent events   7/14 admitted for  possible endocarditis 7/19 negative TEE for vegetations, all cultures negative  Interim History / Subjective:    Objective   Blood pressure 112/76, pulse 100, temperature 98.6 F (37 C), temperature source Oral, resp. rate 16, height $RemoveBe'6\' 4"'eJfRZkxGI$  (1.93 m), weight 106.7 kg, SpO2 99 %.        Intake/Output Summary (Last 24 hours) at 06/02/2021 1806 Last data filed at 06/02/2021 0941 Gross per 24 hour  Intake 1015.89 ml  Output 575 ml  Net 440.89 ml   Filed Weights   05/31/21 0501 06/01/21 0455 06/02/21 0617  Weight: 104.5 kg 104.5 kg 106.7 kg    Examination: General: Healthy appearing elderly man sitting up in bed in no acute distress. HENT: [place]/AT, eyes anicteric.  Oral mucosa moist.  No intraoral ulcers.  Edentulous. Lungs: Comfortably on room air, CTA B. Cardiovascular: S1-S2, regular rate and rhythm Abdomen: Soft, nontender Extremities: No cyanosis or edema, no synovitis Neuro: Awake and alert, answering questions appropriately.  Moving all extremities spontaneously. GU: Deferred Derm: Skin warm, dry, no rashes.  Normal skin elasticity.  CT chest personally reviewed-multiple contrast-enhanced pulmonary aneurysms.  Dilated esophagus. Small focal area in the dependent right lower lobe with scarring, most likely related to chronic aspiration with dilated esophagus.  Lack of bilateral presentation suggests against a fibrosing ILD.  Irregular nonenhancing opacity in right middle lobe with air bronchograms.  Resolved Hospital Problem list     Assessment & Plan:  Multiple pulmonary artery aneurysms.  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  ANCA's are negative suggesting against ANCA positive vasculitis causes. - Consider IR evaluation for embolization.  He is above average risk for stroke  with pulmonary AVMs.  Over time if these enlarge he would also be at risk for chronic hypoxia due to shunt. -May need to reconsider if DOAC is appropriate.  Often times pulmonary emboli are misdiagnosed in the setting of pulmonary aneurysms.  He is above average risk for significant hemoptysis due to pulmonary aneurysms.  Lower extremity ultrasounds were negative but D-dimer was elevated at 3.92, increased from 0.4 in June 2018 when it was last checked. -Unlikely that pulmonary aneurysms are caused by previous PEs.  2 establish this causation we would need documentation of previous PEs and those segmental arteries. -Agree with ID that antibiotics can likely be safely stopped  Pulmonary hypertension; unclear if this is related to pulmonary aneurysms -Appreciate cardiology's management    Best Practice (right click and "Reselect all SmartList Selections" daily)   Deferred to primary  Labs   CBC: Recent Labs  Lab 05/28/21 1350 05/29/21 0142 05/30/21 0247 05/31/21 0118 06/01/21 0616 06/02/21 0131  WBC 4.8 5.3 4.0 3.8* 4.4 5.0  NEUTROABS 3.2  --   --   --   --   --   HGB 12.8* 12.4* 13.3 12.5* 12.8* 12.8*  HCT 39.1 37.2* 39.3 37.8* 37.8* 38.9*  MCV 91.8 89.2 87.9 89.4 88.7 89.6  PLT 175 161 189 179 176 818    Basic Metabolic Panel: Recent Labs  Lab 05/28/21 1350 05/29/21 0201 05/31/21 1010 06/01/21 0616  NA 134* 134* 135  --   K 4.6 4.1 4.7  --   CL 103 106 106  --   CO2 25 21* 22  --   GLUCOSE 98 116* 97  --   BUN $Re'21 20 14  'PxV$ --   CREATININE 1.36* 1.22 1.12 1.13  CALCIUM 9.3 9.3 10.0  --    GFR: Estimated Creatinine Clearance: 89.7 mL/min (by C-G formula based on SCr of 1.13 mg/dL). Recent Labs  Lab 05/28/21 1339 05/28/21 1350 05/30/21 0247 05/31/21 0118 06/01/21 0616 06/02/21 0131  WBC  --    < > 4.0 3.8* 4.4 5.0  LATICACIDVEN 1.4  --   --   --   --   --    < > = values in this interval not displayed.    Liver Function Tests: Recent Labs  Lab 05/28/21 1350  05/31/21 1010  AST 10* 15  ALT 10 11  ALKPHOS 50 43  BILITOT 0.7 0.6  PROT 8.0 7.3  ALBUMIN 3.6 3.1*   No results for input(s): LIPASE, AMYLASE in the last 168 hours. No results for input(s): AMMONIA in the last 168 hours.  ABG    Component Value Date/Time   PHART 7.468 (H) 09/15/2012 0132   PCO2ART 30.9 (L) 09/15/2012 0132   PO2ART 96.0 09/15/2012 0132   HCO3 22.4 09/15/2012 0132   TCO2 23 09/15/2012 0132   ACIDBASEDEF 1.0 09/15/2012 0132   O2SAT 98.0 09/15/2012 0132     Coagulation Profile: Recent Labs  Lab 05/28/21 1350  INR 1.7*    Cardiac Enzymes: No results for input(s): CKTOTAL, CKMB, CKMBINDEX, TROPONINI in the last 168 hours.  HbA1C: No results found for: HGBA1C  CBG: No results for input(s): GLUCAP in the last 168 hours.  Review of Systems:   Review of Systems  Constitutional:  Negative for chills, fever and weight loss.  HENT: Negative.         No aphthous ulcers  Eyes:  Negative for pain and redness.  Respiratory:  Negative for cough, hemoptysis and shortness of breath.   Cardiovascular:  Negative for chest pain and leg swelling.  Gastrointestinal:  Negative for blood in stool, heartburn and nausea.  Genitourinary:  Negative for hematuria.       No genital ulcers  Musculoskeletal:  Negative for joint pain and myalgias.  Skin:  Negative for rash.  Neurological:  Positive for dizziness. Negative for focal weakness.  Endo/Heme/Allergies:  Negative for environmental allergies. Does not bruise/bleed easily.  Psychiatric/Behavioral: Negative.      Past Medical History:  He,  has a past medical history of Anemia, Atrial fibrillation ([place]), AVM (arteriovenous malformation), Cardiac arrest ([place]) (1998), Cardiomyopathy, nonischemic ([place]), Chronic systolic heart failure ([place]), Essential hypertension, History of colonic polyps, and Pneumonia.   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 His smoking use included cigarettes. He started smoking about 43 years ago. He has a 4.80 pack-year smoking history. He has never used smokeless tobacco. He reports previous drug use. Drug: Marijuana. He reports that he does not drink alcohol.   Family History:  His family history includes Alzheimer's disease in his father; Diabetes in his brother and mother; Hypertension in his brother and father.   Allergies No Known Allergies   Home Medications  Prior to Admission medications   Medication Sig Start Date End Date Taking? Authorizing Provider  carvedilol (COREG) 25 MG tablet TAKE 2 TABLETS (50 MG TOTAL) BY MOUTH 2 (TWO) TIMES DAILY WITH A MEAL. *DOSE INCREASE Patient taking differently: Take 50 mg by mouth 2 (two) times daily with a meal. 05/26/21  Yes Satira Sark, MD  Cholecalciferol (VITAMIN D3) 2000 UNITS capsule Take 2,000 Units by mouth daily.   Yes [provider]  dapagliflozin propanediol (FARXIGA) 10 MG TABS tablet Take 1 tablet (10 mg total) by mouth daily before breakfast. 04/21/21  Yes Satira Sark, MD  ELIQUIS 5 MG TABS tablet TAKE 1 TABLET BY MOUTH TWICE A DAY 12/24/20  Yes Satira Sark, MD  ENTRESTO  24-26 MG TAKE 1 TABLET BY MOUTH TWICE A DAY 04/21/21  Yes Satira Sark, MD  finasteride (PROSCAR) 5 MG tablet TAKE 1 TABLET BY MOUTH EVERY DAY 01/11/21  Yes Irine Seal, MD  furosemide (LASIX) 20 MG tablet Take 1 tablet (20 mg total) by mouth daily. 04/21/21  Yes Satira Sark, MD  polyethylene glycol powder (GLYCOLAX/MIRALAX) powder Take 17 g by mouth daily as needed. For constipation - mix with 8 oz liquid and drink 04/19/12  Yes [provider]  potassium chloride (MICRO-K) 10  MEQ CR capsule Take 1 capsule (10 mEq total) by mouth daily. 02/24/12  Yes de Stanford Scotland, MD  pravastatin (PRAVACHOL) 40 MG tablet Take 40 mg by mouth daily.   Yes [provider]  spironolactone (ALDACTONE) 25 MG tablet TAKE 1/2 TABLET BY MOUTH EVERY DAY 06/09/20  Yes Satira Sark, MD     Julian Hy, DO 06/02/21 6:22 PM [facility]

## 2021-06-02 NOTE — Anesthesia Procedure Notes (Signed)
Procedure Name: MAC Date/Time: 06/02/2021 9:16 AM Performed by: Fulton Reek, CRNA Pre-anesthesia Checklist: Patient identified, Emergency Drugs available, Suction available, Patient being monitored and Timeout performed Patient Re-evaluated:Patient Re-evaluated prior to induction Oxygen Delivery Method: Nasal cannula Preoxygenation: Pre-oxygenation with 100% oxygen Induction Type: IV induction Dental Injury: Teeth and Oropharynx as per pre-operative assessment

## 2021-06-02 NOTE — Progress Notes (Signed)
    Transesophageal Echocardiogram Note  JERIMIAH WOLMAN [id number] 02/18/1956  Procedure: Transesophageal Echocardiogram Indications: Bacteremia  Procedure Details Consent: Obtained Time Out: Verified patient identification, verified procedure, site/side was marked, verified correct patient position, special equipment/implants available, Radiology Safety Procedures followed,  medications/allergies/relevent history reviewed, required imaging and test results available.  Performed  Medications:  Pt sedated by anesthesia with lidocaine 40 mg and diprovan 313 mg IV total.  Global hypokinesis with EF 40. Mild RVE with moderate RV dysfunction Moderate LAE; no LAA thrombus Severe LAE; atrial septal bowing to left c/w elevated RA pressure Moderate pericardial effusion. Mild aortic atherosclerosis. Normal aortic valve with no AI Mild MR Severe TR. Pacemaker noted No vegetations.   Complications: No apparent complications Patient did tolerate procedure well.  Kirk Ruths, MD

## 2021-06-02 NOTE — Progress Notes (Addendum)
Started on PROGRESS NOTE    [name] [name]  WIO:[id number] DOB: 12/14/1955 DOA: 05/28/2021 PCP: Glenda Chroman, MD     Brief Narrative:  [name] [name] is a 65yo male with past medical history significant for chronic anemia, permanent A. fib, ICD placement status post cardiac arrest, combined systolic and diastolic heart failure, nonischemic cardiomyopathy, hypertension who presented to the emergency department with concerns of abnormal chest CT findings suspicious for multifocal mycotic aneurysms.  Patient was transferred to [facility].  He was started on empiric antibiotics, underwent TEE 7/19.  New events last 24 hours / Subjective: TEE today without any vegetation.  Had episode of hypotension postprocedure, improved on my examination.  Patient without any complaints.   Assessment & Plan:   Principal Problem:   Pulmonary artery aneurysm [facility]) Active Problems:   Atrial fibrillation (HCC)   Chronic systolic heart failure (HCC)   Implantable cardioverter-defibrillator (ICD) in situ   Cardiomyopathy, nonischemic (HCC)   Essential hypertension, benign   Chronic anticoagulation   Pulmonary embolism ([place])   Multiple pulmonary arterial aneurysms  -Pulmonology evaluated patient on 7/14, signed off  -Infectious disease following -Echocardiogram similar to previous echocardiogram with EF 35 to 40%.  No evidence of valvular vegetation seen -TEE without vegetation -Blood cultures negative to date -Stop antibiotics vancomycin, cefepime -Echo revealed severely elevated pulm artery systolic pressure. Will ask cardiology to see   Scattered subsegmental PE -Venous doppler neg for DVT  -PE in setting of Eliquis compliance. Will change to coumadin/lovenox   Nonischemic cardiomyopathy, with chronic combined systolic and diastolic heart failure, status post ICD -Continue Coreg, Lasix, Entresto, Aldactone - hold this morning due to hypotension post-procedure   Permanent A.  fib -On Eliquis, coreg PTA -Change to coumadin/lovenox   CKD stage IIIa -Stable  HLD -Continue pravachol   Elevated TSH -Free T4 elevated 1.18 -Repeat TSH in 4 to 6 weeks    DVT prophylaxis: TED hose Start: 05/28/21 1457 SCDs Start: 05/28/21 1456 Place TED hose Start: 05/28/21 1456 warfarin (COUMADIN) tablet 7.5 mg  Code Status:     Code Status Orders  (From admission, onward)           Start     Ordered   05/28/21 1456  Full code  Continuous        05/28/21 1457           Code Status History     Date Active Date Inactive Code Status Order ID Comments User Context   05/13/2017 0642 05/13/2017 1852 Full Code [id number]  Oswald Hillock, MD Inpatient   09/07/2012 0159 09/29/2012 1438 Full Code [id number]  Fara Olden, RN Inpatient      Family Communication: None at bedside Disposition Plan:  Status is: Inpatient  Remains inpatient appropriate because:Inpatient level of care appropriate due to severity of illness  Dispo: The patient is from: Home              Anticipated d/c is to: Home              Patient currently is not medically stable to d/c.     Difficult to place patient No      Consultants:  Pulmonology ID  Cardiology  Procedures:  TEE 7/19   Antimicrobials:  Anti-infectives (From admission, onward)    Start     Dose/Rate Route Frequency Ordered Stop   06/02/21 0930  vancomycin (VANCOREADY) IVPB 1500 mg/300 mL  Status:  Discontinued  1,500 mg 150 mL/hr over 120 Minutes Intravenous Every 24 hours 06/01/21 1834 06/02/21 1052   05/29/21 0400  vancomycin (VANCOCIN) IVPB 1000 mg/200 mL premix  Status:  Discontinued        1,000 mg 200 mL/hr over 60 Minutes Intravenous Every 12 hours 05/28/21 1433 06/01/21 1834   05/28/21 1500  vancomycin (VANCOREADY) IVPB 2000 mg/400 mL        2,000 mg 200 mL/hr over 120 Minutes Intravenous  Once 05/28/21 1429 05/28/21 1853   05/28/21 1430  ceFEPIme (MAXIPIME) 2 g in sodium chloride 0.9 % 100 mL  IVPB  Status:  Discontinued        2 g 200 mL/hr over 30 Minutes Intravenous  Once 05/28/21 1424 05/28/21 1428   05/28/21 1430  metroNIDAZOLE (FLAGYL) IVPB 500 mg        500 mg 100 mL/hr over 60 Minutes Intravenous  Once 05/28/21 1424 05/28/21 1627   05/28/21 1430  vancomycin (VANCOCIN) IVPB 1000 mg/200 mL premix  Status:  Discontinued        1,000 mg 200 mL/hr over 60 Minutes Intravenous  Once 05/28/21 1424 05/28/21 1429   05/28/21 1430  ceFEPIme (MAXIPIME) 2 g in sodium chloride 0.9 % 100 mL IVPB  Status:  Discontinued        2 g 200 mL/hr over 30 Minutes Intravenous Every 8 hours 05/28/21 1428 06/02/21 1052        Objective: Vitals:   06/02/21 1020 06/02/21 1025 06/02/21 1030 06/02/21 1057  BP: (!) 90/44 (!) 88/53 (!) 101/59 102/63  Pulse: 94 (!) 115 (!) 116 (!) 103  Resp: (!) 25 (!) 31 (!) 32 20  Temp:    98.5 F (36.9 C)  TempSrc:    Oral  SpO2: 96% 95% 94% 93%  Weight:      Height:        Intake/Output Summary (Last 24 hours) at 06/02/2021 1146 Last data filed at 06/02/2021 0941 Gross per 24 hour  Intake 1566.2 ml  Output 1125 ml  Net 441.2 ml    Filed Weights   05/31/21 0501 06/01/21 0455 06/02/21 0617  Weight: 104.5 kg 104.5 kg 106.7 kg    Examination: General exam: Appears calm and comfortable  Respiratory system: Clear to auscultation. Respiratory effort normal. Cardiovascular system: S1 & S2 heard, irreg rhythm. No pedal edema. Gastrointestinal system: Abdomen is nondistended, soft and nontender. Normal bowel sounds heard. Central nervous system: Alert and oriented. Non focal exam. Speech clear  Extremities: Symmetric in appearance bilaterally  Skin: No rashes, lesions or ulcers on exposed skin  Psychiatry: Judgement and insight appear stable. Mood & affect appropriate.     Data Reviewed: I have personally reviewed following labs and imaging studies  CBC: Recent Labs  Lab 05/28/21 1350 05/29/21 0142 05/30/21 0247 05/31/21 0118 06/01/21 0616  06/02/21 0131  WBC 4.8 5.3 4.0 3.8* 4.4 5.0  NEUTROABS 3.2  --   --   --   --   --   HGB 12.8* 12.4* 13.3 12.5* 12.8* 12.8*  HCT 39.1 37.2* 39.3 37.8* 37.8* 38.9*  MCV 91.8 89.2 87.9 89.4 88.7 89.6  PLT 175 161 189 179 176 833    Basic Metabolic Panel: Recent Labs  Lab 05/28/21 1350 05/29/21 0201 05/31/21 1010 06/01/21 0616  NA 134* 134* 135  --   K 4.6 4.1 4.7  --   CL 103 106 106  --   CO2 25 21* 22  --   GLUCOSE 98 116* 97  --  BUN 21 20 14   --   CREATININE 1.36* 1.22 1.12 1.13  CALCIUM 9.3 9.3 10.0  --     GFR: Estimated Creatinine Clearance: 89.7 mL/min (by C-G formula based on SCr of 1.13 mg/dL). Liver Function Tests: Recent Labs  Lab 05/28/21 1350 05/31/21 1010  AST 10* 15  ALT 10 11  ALKPHOS 50 43  BILITOT 0.7 0.6  PROT 8.0 7.3  ALBUMIN 3.6 3.1*    No results for input(s): LIPASE, AMYLASE in the last 168 hours. No results for input(s): AMMONIA in the last 168 hours. Coagulation Profile: Recent Labs  Lab 05/28/21 1350  INR 1.7*    Cardiac Enzymes: No results for input(s): CKTOTAL, CKMB, CKMBINDEX, TROPONINI in the last 168 hours. BNP (last 3 results) No results for input(s): PROBNP in the last 8760 hours. HbA1C: No results for input(s): HGBA1C in the last 72 hours. CBG: No results for input(s): GLUCAP in the last 168 hours. Lipid Profile: No results for input(s): CHOL, HDL, LDLCALC, TRIG, CHOLHDL, LDLDIRECT in the last 72 hours. Thyroid Function Tests: No results for input(s): TSH, T4TOTAL, FREET4, T3FREE, THYROIDAB in the last 72 hours.  Anemia Panel: No results for input(s): VITAMINB12, FOLATE, FERRITIN, TIBC, IRON, RETICCTPCT in the last 72 hours. Sepsis Labs: Recent Labs  Lab 05/28/21 1339  LATICACIDVEN 1.4     Recent Results (from the past 240 hour(s))  Urine Culture     Status: Abnormal   Collection Time: 05/28/21  1:17 PM   Specimen: Urine, Clean Catch  Result Value Ref Range Status   Specimen Description   Final    URINE,  CLEAN CATCH Performed at [facility], [address], [place], [place] [address]    Special Requests   Final    NONE Performed at [facility], [address], [place], [place] [address]    Culture (A)  Final    <10,000 COLONIES/mL INSIGNIFICANT GROWTH Performed at [facility], [place] [address], [place], [place] [address]    Report Status 05/30/2021 FINAL  Final  Blood Culture (routine x 2)     Status: None (Preliminary result)   Collection Time: 05/28/21  1:39 PM   Specimen: Right Antecubital; Blood  Result Value Ref Range Status   Specimen Description   Final    RIGHT ANTECUBITAL BOTTLES DRAWN AEROBIC AND ANAEROBIC   Special Requests Blood Culture adequate volume  Final   Culture   Final    NO GROWTH 4 DAYS Performed at [facility], [address], [place], [place] [address]    Report Status PENDING  Incomplete  Blood Culture (routine x 2)     Status: None (Preliminary result)   Collection Time: 05/28/21  1:39 PM   Specimen: Left Antecubital; Blood  Result Value Ref Range Status   Specimen Description   Final    LEFT ANTECUBITAL BOTTLES DRAWN AEROBIC AND ANAEROBIC   Special Requests Blood Culture adequate volume  Final   Culture   Final    NO GROWTH 4 DAYS Performed at [facility], [address], [place], [place] [address]    Report Status PENDING  Incomplete  Resp Panel by RT-PCR (Flu A&B, Covid) Nasopharyngeal Swab     Status: None   Collection Time: 05/28/21  1:49 PM   Specimen: Nasopharyngeal Swab; Nasopharyngeal(NP) swabs in vial transport medium  Result Value Ref Range Status   SARS Coronavirus 2 by RT PCR NEGATIVE NEGATIVE Final    Comment: (NOTE) SARS-CoV-2 target nucleic acids are NOT DETECTED.  The SARS-CoV-2 RNA is generally detectable in upper respiratory specimens during the acute phase of infection. The lowest concentration of SARS-CoV-2 viral copies this assay can detect is 138 copies/mL. A negative result does not preclude SARS-Cov-2 infection  and should not be used as the sole basis for treatment or other patient management decisions. A negative result may occur with  improper specimen collection/handling, submission of specimen other than nasopharyngeal swab, presence of viral mutation(s) within the areas targeted by this assay, and inadequate number of viral copies(<138 copies/mL). A negative result must be combined with clinical observations, patient history, and epidemiological information. The expected result is Negative.  Fact Sheet for Patients:  EntrepreneurPulse.com.au  Fact Sheet for Healthcare Providers:  IncredibleEmployment.be  This test is no t yet approved or cleared by the [place] FDA and  has been authorized for detection and/or diagnosis of SARS-CoV-2 by FDA under an Emergency Use Authorization (EUA). This EUA will remain  in effect (meaning this test can be used) for the duration of the COVID-19 declaration under Section 564(b)(1) of the Act, 21 U.S.C.section 360bbb-3(b)(1), unless the authorization is terminated  or revoked sooner.       Influenza A by PCR NEGATIVE NEGATIVE Final   Influenza B by PCR NEGATIVE NEGATIVE Final    Comment: (NOTE) The Xpert Xpress SARS-CoV-2/FLU/RSV plus assay is intended as an aid in the diagnosis of influenza from Nasopharyngeal swab specimens and should not be used as a sole basis for treatment. Nasal washings and aspirates are unacceptable for Xpert Xpress SARS-CoV-2/FLU/RSV testing.  Fact Sheet for Patients: EntrepreneurPulse.com.au  Fact Sheet for Healthcare Providers: IncredibleEmployment.be  This test is not yet approved or cleared by the [place] FDA and has been authorized for detection and/or diagnosis of SARS-CoV-2 by FDA under an Emergency Use Authorization (EUA). This EUA will remain in effect (meaning this test can be used) for the duration of the COVID-19 declaration  under Section 564(b)(1) of the Act, 21 U.S.C. section 360bbb-3(b)(1), unless the authorization is terminated or revoked.  Performed at [facility], [address] [place], [place] [address]        Radiology Studies: No results found.    Scheduled Meds:  carvedilol  3.125 mg Oral BID WC   cholecalciferol  2,000 Units Oral Daily   dapagliflozin propanediol  10 mg Oral Daily   enoxaparin (LOVENOX) injection  100 mg Subcutaneous Q12H   finasteride  5 mg Oral Daily   furosemide  20 mg Oral Daily   multivitamin with minerals  1 tablet Oral Daily   potassium chloride  10 mEq Oral Daily   pravastatin  40 mg Oral QHS   sacubitril-valsartan  1 tablet Oral BID   sodium chloride flush  3 mL Intravenous Q12H   sodium chloride flush  3 mL Intravenous Q12H   spironolactone  12.5 mg Oral Daily   warfarin  7.5 mg Oral ONCE-1600   Warfarin - Pharmacist Dosing Inpatient   Does not apply q1600   Continuous Infusions:  sodium chloride       LOS: 5 days      Time spent: 20 minutes   Dessa Phi, DO [facility] 06/02/2021, 11:46 AM   Available via Epic secure chat 7am-7pm After these hours, please refer to coverage provider listed on [email or web address]

## 2021-06-02 NOTE — Progress Notes (Addendum)
[facility] for heparin>> warfarin with enox bridge Indication: pulmonary embolus 05/25/21  No Known Allergies  Patient Measurements: Height: 6\' 4"  (193 cm) Weight: 106.7 kg (235 lb 4.8 oz) IBW/kg (Calculated) : 86.8 Heparin Dosing Weight: 105 kg  Vital Signs: Temp: 98.2 F (36.8 C) (07/19 1015) Temp Source: Oral (07/19 1015) BP: 101/59 (07/19 1030) Pulse Rate: 116 (07/19 1030)  Labs: Recent Labs    05/31/21 0118 05/31/21 1010 06/01/21 0616 06/02/21 0131  HGB 12.5*  --  12.8* 12.8*  HCT 37.8*  --  37.8* 38.9*  PLT 179  --  176 158  APTT 99*  --   --   --   HEPARINUNFRC 0.46  --  0.31 0.21*  CREATININE  --  1.12 1.13  --      Estimated Creatinine Clearance: 89.7 mL/min (by C-G formula based on SCr of 1.13 mg/dL).   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 Patient was on apixaban PTA for afib, last dose 7/14 ~0630. Pharmacy consulted to switch heparin to warfarin with enoxaparin bridge.   Good renal function. Eating 100% of meals. No major DDI. INR was 1.7 on 7/14, affected by apixaban.   Goal of Therapy:  INR 2- 3 Monitor platelets by anticoagulation protocol: Yes  Plan:  Stop heparin  Start enoxaparin 100mg  Q12 hrs, stop when INR > 2  Warfarin 7.5mg  x1 Monitor daily INR, CBC/plt Monitor for signs/symptoms of bleeding   Benetta Spar, PharmD, BCPS,  BCCP Clinical Pharmacist  Please check AMION for all [place] phone numbers After 10:00 PM, call [place]

## 2021-06-02 NOTE — Progress Notes (Signed)
ANTICOAGULATION CONSULT NOTE  Pharmacy Consult for heparin  Indication: pulmonary embolus 05/25/21 No Known Allergies  Patient Measurements: Height: 6\' 4"  (193 cm) Weight: 106.7 kg (235 lb 4.8 oz) IBW/kg (Calculated) : 86.8 Heparin Dosing Weight: 105 kg  Vital Signs: Temp: 98.5 F (36.9 C) (07/19 1057) Temp Source: Oral (07/19 1057) BP: 102/63 (07/19 1057) Pulse Rate: 103 (07/19 1057)  Labs: Recent Labs    05/31/21 0118 05/31/21 1010 06/01/21 0616 06/02/21 0131  HGB 12.5*  --  12.8* 12.8*  HCT 37.8*  --  37.8* 38.9*  PLT 179  --  176 158  APTT 99*  --   --   --   HEPARINUNFRC 0.46  --  0.31 0.21*  CREATININE  --  1.12 1.13  --      Estimated Creatinine Clearance: 89.7 mL/min (by C-G formula based on SCr of 1.13 mg/dL).   Medical History: Past Medical History:  Diagnosis Date   Anemia    Atrial fibrillation ([place])    AVM (arteriovenous malformation)    Duodenum - nonbleeding and EGD 11/13   Cardiac arrest ([place]) 1998   ICD implanted at [facility]   Cardiomyopathy, nonischemic (HCC)    LVEF 81-44%   Chronic systolic heart failure (HCC)    Essential hypertension    History of colonic polyps    Colonoscopy 11/13   Pneumonia    Severe with respiratory failure 10/13    Assessment: 65 yo M with acute small areas of PE 05/25/21 in the setting of multifocal Mycotic aneurysms in the pulmonary arterial bed and possible septic emboli. Patient was on apixaban PTA for afib, last dose 7/14 ~0630. Pharmacy consulted to switch heparin to warfarin with enoxaparin bridge.  Later in day decision - plan R/L Heart cath  Will stop enoxaparin - last dose 7/19 at 12noon Stop warfarin Restart heparin 12hr after enoxaparin dose  Goal of Therapy:  INR 2- 3 Monitor platelets by anticoagulation protocol: Yes  Plan:  Stop enoxaparin Stop warfarin  Begin heparin drip 2100 uts/hr at midnight Monitor daily INR, heparin level CBC/plt Monitor for signs/symptoms of bleeding   Bonnita Nasuti  Pharm.D. CPP, BCPS Clinical Pharmacist [phone] 06/02/2021 4:34 PM  Please check AMION for all [place] phone numbers After 10:00 PM, call [place] [phone]

## 2021-06-02 NOTE — Plan of Care (Signed)

## 2021-06-02 NOTE — Progress Notes (Signed)
Subjective:  He feels well and is reassured by TEE findings    Antibiotics:  Anti-infectives (From admission, onward)    Start     Dose/Rate Route Frequency Ordered Stop   06/02/21 0930  vancomycin (VANCOREADY) IVPB 1500 mg/300 mL  Status:  Discontinued        1,500 mg 150 mL/hr over 120 Minutes Intravenous Every 24 hours 06/01/21 1834 06/02/21 1052   05/29/21 0400  vancomycin (VANCOCIN) IVPB 1000 mg/200 mL premix  Status:  Discontinued        1,000 mg 200 mL/hr over 60 Minutes Intravenous Every 12 hours 05/28/21 1433 06/01/21 1834   05/28/21 1500  vancomycin (VANCOREADY) IVPB 2000 mg/400 mL        2,000 mg 200 mL/hr over 120 Minutes Intravenous  Once 05/28/21 1429 05/28/21 1853   05/28/21 1430  ceFEPIme (MAXIPIME) 2 g in sodium chloride 0.9 % 100 mL IVPB  Status:  Discontinued        2 g 200 mL/hr over 30 Minutes Intravenous  Once 05/28/21 1424 05/28/21 1428   05/28/21 1430  metroNIDAZOLE (FLAGYL) IVPB 500 mg        500 mg 100 mL/hr over 60 Minutes Intravenous  Once 05/28/21 1424 05/28/21 1627   05/28/21 1430  vancomycin (VANCOCIN) IVPB 1000 mg/200 mL premix  Status:  Discontinued        1,000 mg 200 mL/hr over 60 Minutes Intravenous  Once 05/28/21 1424 05/28/21 1429   05/28/21 1430  ceFEPIme (MAXIPIME) 2 g in sodium chloride 0.9 % 100 mL IVPB  Status:  Discontinued        2 g 200 mL/hr over 30 Minutes Intravenous Every 8 hours 05/28/21 1428 06/02/21 1052       Medications: Scheduled Meds:  carvedilol  3.125 mg Oral BID WC   cholecalciferol  2,000 Units Oral Daily   dapagliflozin propanediol  10 mg Oral Daily   enoxaparin   Does not apply Once   finasteride  5 mg Oral Daily   furosemide  40 mg Intravenous BID   multivitamin with minerals  1 tablet Oral Daily   potassium chloride  10 mEq Oral Daily   pravastatin  40 mg Oral QHS   sacubitril-valsartan  1 tablet Oral BID   sodium chloride flush  3 mL Intravenous Q12H   sodium chloride flush  3 mL  Intravenous Q12H   sodium chloride flush  3 mL Intravenous Q12H   spironolactone  12.5 mg Oral Daily   Warfarin - Pharmacist Dosing Inpatient   Does not apply q1600   Continuous Infusions:  sodium chloride     [START ON 06/03/2021] heparin     PRN Meds:.sodium chloride, acetaminophen **OR** acetaminophen, bisacodyl, ondansetron **OR** ondansetron (ZOFRAN) IV, polyethylene glycol, sodium chloride flush    Objective: Weight change: 2.223 kg  Intake/Output Summary (Last 24 hours) at 06/02/2021 1908 Last data filed at 06/02/2021 0941 Gross per 24 hour  Intake 1015.89 ml  Output 575 ml  Net 440.89 ml    Blood pressure 112/76, pulse 100, temperature 98.6 F (37 C), temperature source Oral, resp. rate 16, height 6\' 4"  (1.93 m), weight 106.7 kg, SpO2 99 %. Temp:  [98.2 F (36.8 C)-99 F (37.2 C)] 98.6 F (37 C) (07/19 1400) Pulse Rate:  [77-116] 100 (07/19 1400) Resp:  [15-32] 16 (07/19 1400) BP: (79-117)/(44-76) 112/76 (07/19 1400) SpO2:  [93 %-100 %] 99 % (07/19 1400) Weight:  [106.7 kg] 106.7 kg (07/19 0617)  Physical Exam: Physical Exam Constitutional:      Appearance: He is well-developed.  HENT:     Head: Normocephalic and atraumatic.  Eyes:     Conjunctiva/sclera: Conjunctivae normal.  Cardiovascular:     Rate and Rhythm: Normal rate and regular rhythm.     Heart sounds: No murmur heard.   No friction rub. No gallop.     Comments: ICD site is clean Pulmonary:     Effort: Pulmonary effort is normal. No respiratory distress.     Breath sounds: Normal breath sounds. No stridor. No wheezing or rhonchi.  Abdominal:     General: Abdomen is flat. There is no distension.     Palpations: Abdomen is soft. There is no mass.  Musculoskeletal:        General: Normal range of motion.     Cervical back: Normal range of motion and neck supple.  Skin:    General: Skin is warm and dry.     Coloration: Skin is not jaundiced or pale.     Findings: No bruising, erythema, lesion or  rash.  Neurological:     General: No focal deficit present.     Mental Status: He is alert and oriented to person, place, and time.  Psychiatric:        Mood and Affect: Mood normal.        Behavior: Behavior normal.        Thought Content: Thought content normal.        Judgment: Judgment normal.     CBC:    BMET Recent Labs    05/31/21 1010 06/01/21 0616  NA 135  --   K 4.7  --   CL 106  --   CO2 22  --   GLUCOSE 97  --   BUN 14  --   CREATININE 1.12 1.13  CALCIUM 10.0  --       Liver Panel  Recent Labs    05/31/21 1010  PROT 7.3  ALBUMIN 3.1*  AST 15  ALT 11  ALKPHOS 43  BILITOT 0.6        Sedimentation Rate No results for input(s): ESRSEDRATE in the last 72 hours.  C-Reactive Protein No results for input(s): CRP in the last 72 hours.   Micro Results: Recent Results (from the past 720 hour(s))  Urine Culture     Status: Abnormal   Collection Time: 05/28/21  1:17 PM   Specimen: Urine, Clean Catch  Result Value Ref Range Status   Specimen Description   Final    URINE, CLEAN CATCH Performed at [facility], [address], [place], [place] [address]    Special Requests   Final    NONE Performed at [facility], [address], [place], [place] [address]    Culture (A)  Final    <10,000 COLONIES/mL INSIGNIFICANT GROWTH Performed at [facility] [address] [place], [place] [address]    Report Status 05/30/2021 FINAL  Final  Blood Culture (routine x 2)     Status: None   Collection Time: 05/28/21  1:39 PM   Specimen: Right Antecubital; Blood  Result Value Ref Range Status   Specimen Description   Final    RIGHT ANTECUBITAL BOTTLES DRAWN AEROBIC AND ANAEROBIC   Special Requests Blood Culture adequate volume  Final   Culture   Final    NO GROWTH 5 DAYS Performed at [facility], [address] [place], [place] [address]    Report Status  06/02/2021 FINAL  Final  Blood Culture (routine x 2)     Status: None   Collection Time:  05/28/21  1:39 PM   Specimen: Left Antecubital; Blood  Result Value Ref Range Status   Specimen Description   Final    LEFT ANTECUBITAL BOTTLES DRAWN AEROBIC AND ANAEROBIC   Special Requests Blood Culture adequate volume  Final   Culture   Final    NO GROWTH 5 DAYS Performed at [facility], [address], [place], [place] [address]    Report Status 06/02/2021 FINAL  Final  Resp Panel by RT-PCR (Flu A&B, Covid) Nasopharyngeal Swab     Status: None   Collection Time: 05/28/21  1:49 PM   Specimen: Nasopharyngeal Swab; Nasopharyngeal(NP) swabs in vial transport medium  Result Value Ref Range Status   SARS Coronavirus 2 by RT PCR NEGATIVE NEGATIVE Final    Comment: (NOTE) SARS-CoV-2 target nucleic acids are NOT DETECTED.  The SARS-CoV-2 RNA is generally detectable in upper respiratory specimens during the acute phase of infection. The lowest concentration of SARS-CoV-2 viral copies this assay can detect is 138 copies/mL. A negative result does not preclude SARS-Cov-2 infection and should not be used as the sole basis for treatment or other patient management decisions. A negative result may occur with  improper specimen collection/handling, submission of specimen other than nasopharyngeal swab, presence of viral mutation(s) within the areas targeted by this assay, and inadequate number of viral copies(<138 copies/mL). A negative result must be combined with clinical observations, patient history, and epidemiological information. The expected result is Negative.  Fact Sheet for Patients:  EntrepreneurPulse.com.au  Fact Sheet for Healthcare Providers:  IncredibleEmployment.be  This test is no t yet approved or cleared by the [place] FDA and  has been authorized for detection and/or diagnosis of SARS-CoV-2 by FDA under an Emergency Use Authorization (EUA). This EUA will remain  in effect (meaning this test can be used) for the duration of  the COVID-19 declaration under Section 564(b)(1) of the Act, 21 U.S.C.section 360bbb-3(b)(1), unless the authorization is terminated  or revoked sooner.       Influenza A by PCR NEGATIVE NEGATIVE Final   Influenza B by PCR NEGATIVE NEGATIVE Final    Comment: (NOTE) The Xpert Xpress SARS-CoV-2/FLU/RSV plus assay is intended as an aid in the diagnosis of influenza from Nasopharyngeal swab specimens and should not be used as a sole basis for treatment. Nasal washings and aspirates are unacceptable for Xpert Xpress SARS-CoV-2/FLU/RSV testing.  Fact Sheet for Patients: EntrepreneurPulse.com.au  Fact Sheet for Healthcare Providers: IncredibleEmployment.be  This test is not yet approved or cleared by the [place] FDA and has been authorized for detection and/or diagnosis of SARS-CoV-2 by FDA under an Emergency Use Authorization (EUA). This EUA will remain in effect (meaning this test can be used) for the duration of the COVID-19 declaration under Section 564(b)(1) of the Act, 21 U.S.C. section 360bbb-3(b)(1), unless the authorization is terminated or revoked.  Performed at [facility], [address] [place], [place] [address]     Studies/Results: ECHO TEE  Result Date: 06/02/2021    TRANSESOPHOGEAL ECHO REPORT   Patient Name:   [name] [name] Date of Exam: 06/02/2021 Medical Rec #:  [id number]         Height:       76.0 in Accession #:    [id number]        Weight:       235.3 lb Date of Birth:  Oct 18, 1956  BSA:          2.374 m Patient Age:    65 years          BP:           117/72 mmHg Patient Gender: M                 HR:           108 bpm. Exam Location:  Inpatient Procedure: Transesophageal Echo, Color Doppler and Cardiac Doppler Indications:     Bacteremia  History:         Patient has prior history of Echocardiogram examinations, most                  recent 05/29/2021. Cardiomyopathy, Arrythmias:Atrial                   Fibrillation; Risk Factors:Hypertension.  Sonographer:     Bernadene Person RDCS Referring Phys:  [id number] Abigail Butts Diagnosing Phys: Kirk Ruths MD PROCEDURE: After discussion of the risks and benefits of a TEE, an informed consent was obtained from the patient. The transesophogeal probe was passed without difficulty through the esophogus of the patient. Sedation performed by different physician. The patient was monitored while under deep sedation. Anesthestetic sedation was provided intravenously by Anesthesiology: 313.41mg  of Propofol, 40mg  of Lidocaine. The patient developed no complications during the procedure. IMPRESSIONS  1. No vegetations.  2. Left ventricular ejection fraction, by estimation, is 40 to 45%. The left ventricle has mild to moderately decreased function. The left ventricle demonstrates global hypokinesis. The left ventricular internal cavity size was mildly dilated.  3. Right ventricular systolic function is moderately reduced. The right ventricular size is mildly enlarged.  4. Left atrial size was moderately dilated. No left atrial/left atrial appendage thrombus was detected.  5. Right atrial size was severely dilated.  6. Moderate pericardial effusion.  7. The mitral valve is normal in structure. Mild mitral valve regurgitation.  8. Tricuspid valve regurgitation is severe.  9. The aortic valve is tricuspid. Aortic valve regurgitation is not visualized. 10. There is mild (Grade II) plaque involving the descending aorta. FINDINGS  Left Ventricle: Left ventricular ejection fraction, by estimation, is 40 to 45%. The left ventricle has mild to moderately decreased function. The left ventricle demonstrates global hypokinesis. The left ventricular internal cavity size was mildly dilated. Right Ventricle: The right ventricular size is mildly enlarged. Right ventricular systolic function is moderately reduced. Left Atrium: Left atrial size was moderately dilated. No left atrial/left atrial  appendage thrombus was detected. Right Atrium: Right atrial size was severely dilated. Pericardium: A moderately sized pericardial effusion is present. Mitral Valve: The mitral valve is normal in structure. Mild mitral valve regurgitation. Tricuspid Valve: The tricuspid valve is grossly normal. Tricuspid valve regurgitation is severe. Aortic Valve: The aortic valve is tricuspid. Aortic valve regurgitation is not visualized. Pulmonic Valve: The pulmonic valve was normal in structure. Pulmonic valve regurgitation is not visualized. Aorta: The aortic root is normal in size and structure. There is mild (Grade II) plaque involving the descending aorta. IAS/Shunts: There is left bowing of the interatrial septum, suggestive of elevated right atrial pressure. No atrial level shunt detected by color flow Doppler. Additional Comments: No vegetations. A device lead is visualized.  TRICUSPID VALVE TR Peak grad:   41.2 mmHg TR Vmax:        321.00 cm/s Kirk Ruths MD Electronically signed by Kirk Ruths MD Signature Date/Time: 06/02/2021/1:30:33 PM    Final  Assessment/Plan:  INTERVAL HISTORY:  TEE without vegetations Principal Problem:   Pulmonary artery aneurysm (HCC) Active Problems:   Atrial fibrillation (HCC)   Chronic systolic heart failure (HCC)   Implantable cardioverter-defibrillator (ICD) in situ   Cardiomyopathy, nonischemic (HCC)   Essential hypertension, benign   Chronic anticoagulation   Pulmonary embolism ([place])    [name] [name] is a 65 y.o. 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  Aneurysms:   He has no fever on my review of clinical history and his time in the hospital  He had no antecedent systemic symptoms to suggest infection  Blood cultures  reviewed and are sterile to date  TEE personally reviewed and without vegetations  Agree with stopping systemic antibiotics  Greatly appreciate Pulmonary seeing the patient to assist in workup and management of the patient's multiple aneurysms  I spent more than 37  minutes with the patient including >50% ace to face counseling of the patient re differential diagnosis of aneuryms and oour rationale for him not having mycotic aneurysms, personally  reviewing his CT chest, his CBC, his CMP, his blood cultures , along with review of medical records before and during the visit and in coordination of his care.   We will sign off for now  Please call with further questions.    LOS: 5 days   Alcide Evener 06/02/2021, 7:08 PM

## 2021-06-02 NOTE — Care Management (Signed)
1531 06-02-21 Benefits check submitted for Lovenox/Enoxaparin cost for at least seven days. Case Manager will follow for cost.

## 2021-06-02 NOTE — Anesthesia Preprocedure Evaluation (Addendum)
Anesthesia Evaluation  Patient identified by MRN, date of birth, ID band Patient awake    Reviewed: Allergy & Precautions, NPO status , Patient's Chart, lab work & pertinent test results  Airway Mallampati: I  TM Distance: >3 FB Neck ROM: Full    Dental  (+) Edentulous Upper, Edentulous Lower   Pulmonary former smoker,    breath sounds clear to auscultation       Cardiovascular hypertension, Pt. on home beta blockers + dysrhythmias Atrial Fibrillation + Cardiac Defibrillator  Rhythm:Irregular Rate:Tachycardia  Echo:  1. Left ventricular ejection fraction, by estimation, is 35 to 40%. The  left ventricle has moderately decreased function. The left ventricle  demonstrates global hypokinesis. Left ventricular diastolic function could  not be evaluated. There is the  interventricular septum is flattened in systole and diastole, consistent  with right ventricular pressure and volume overload.  2. Right ventricular systolic function is mildly reduced. The right  ventricular size is moderately enlarged. There is severely elevated  pulmonary artery systolic pressure.  3. Left atrial size was mild to moderately dilated.  4. Right atrial size was severely dilated.  5. Moderate pericardial effusion. The pericardial effusion is  circumferential. There is no evidence of cardiac tamponade.  6. The mitral valve is normal in structure. Trivial mitral valve  regurgitation.  7. The tricuspid valve is abnormal. Tricuspid valve regurgitation is  severe.  8. The aortic valve is tricuspid. Aortic valve regurgitation is not  visualized.  9. The inferior vena cava is dilated in size with <50% respiratory  variability, suggesting right atrial pressure of 15 mmHg.    Neuro/Psych negative neurological ROS  negative psych ROS   GI/Hepatic negative GI ROS, Neg liver ROS,   Endo/Other  negative endocrine ROS  Renal/GU       Musculoskeletal negative musculoskeletal ROS (+)   Abdominal Normal abdominal exam  (+)   Peds  Hematology negative hematology ROS (+)   Anesthesia Other Findings   Reproductive/Obstetrics                            Anesthesia Physical Anesthesia Plan  ASA: 3  Anesthesia Plan: MAC   Post-op Pain Management:    Induction: Intravenous  PONV Risk Score and Plan: 0 and Propofol infusion  Airway Management Planned: Natural Airway and Nasal Cannula  Additional Equipment: None  Intra-op Plan:   Post-operative Plan:   Informed Consent: I have reviewed the patients History and Physical, chart, labs and discussed the procedure including the risks, benefits and alternatives for the proposed anesthesia with the patient or authorized representative who has indicated his/her understanding and acceptance.       Plan Discussed with: CRNA  Anesthesia Plan Comments:        Anesthesia Quick Evaluation

## 2021-06-03 ENCOUNTER — Encounter (HOSPITAL_COMMUNITY)
Admission: EM | Disposition: A | Discharge status: Home / Self Care | Payer: Self-pay | Source: Home / Self Care | Attending: Internal Medicine

## 2021-06-03 DIAGNOSIS — I281 Aneurysm of pulmonary artery: Secondary | ICD-10-CM | POA: Diagnosis not present

## 2021-06-03 DIAGNOSIS — I509 Heart failure, unspecified: Secondary | ICD-10-CM

## 2021-06-03 DIAGNOSIS — I272 Pulmonary hypertension, unspecified: Secondary | ICD-10-CM

## 2021-06-03 HISTORY — PX: RIGHT/LEFT HEART CATH AND CORONARY ANGIOGRAPHY: CATH118266

## 2021-06-03 LAB — CBC
HCT: 35.9 % — ABNORMAL LOW (ref 39.0–52.0)
Hemoglobin: 12.1 g/dL — ABNORMAL LOW (ref 13.0–17.0)
MCH: 30.1 pg (ref 26.0–34.0)
MCHC: 33.7 g/dL (ref 30.0–36.0)
MCV: 89.3 fL (ref 80.0–100.0)
Platelets: 141 10*3/uL — ABNORMAL LOW (ref 150–400)
RBC: 4.02 MIL/uL — ABNORMAL LOW (ref 4.22–5.81)
RDW: 15.1 % (ref 11.5–15.5)
WBC: 4.2 10*3/uL (ref 4.0–10.5)
nRBC: 0 % (ref 0.0–0.2)

## 2021-06-03 LAB — BASIC METABOLIC PANEL
Anion gap: 7 (ref 5–15)
BUN: 15 mg/dL (ref 8–23)
CO2: 21 mmol/L — ABNORMAL LOW (ref 22–32)
Calcium: 9.6 mg/dL (ref 8.9–10.3)
Chloride: 107 mmol/L (ref 98–111)
Creatinine, Ser: 1.4 mg/dL — ABNORMAL HIGH (ref 0.61–1.24)
GFR, Estimated: 56 mL/min — ABNORMAL LOW (ref >60)
Glucose, Bld: 94 mg/dL (ref 70–99)
Potassium: 4.1 mmol/L (ref 3.5–5.1)
Sodium: 135 mmol/L (ref 135–145)

## 2021-06-03 LAB — HEPARIN LEVEL (UNFRACTIONATED): Heparin Unfractionated: 0.38 IU/mL (ref 0.30–0.70)

## 2021-06-03 LAB — PROTIME-INR
INR: 1.4 — ABNORMAL HIGH (ref 0.8–1.2)
Prothrombin Time: 17.3 seconds — ABNORMAL HIGH (ref 11.4–15.2)

## 2021-06-03 SURGERY — RIGHT/LEFT HEART CATH AND CORONARY ANGIOGRAPHY
Anesthesia: LOCAL

## 2021-06-03 MED ORDER — ACETAMINOPHEN 325 MG PO TABS: 650.0000 mg | ORAL_TABLET | ORAL | Status: DC | PRN

## 2021-06-03 MED ORDER — ASPIRIN 81 MG PO CHEW
81.0000 mg | CHEWABLE_TABLET | ORAL | Status: AC
Start: 2021-06-03 — End: 2021-06-03
  Administered 2021-06-03: 81 mg via ORAL
  Filled 2021-06-03: qty 1

## 2021-06-03 MED ORDER — SODIUM CHLORIDE 0.9 % IV SOLN: INTRAVENOUS | Status: AC

## 2021-06-03 MED ORDER — MIDAZOLAM HCL 2 MG/2ML IJ SOLN
INTRAMUSCULAR | Status: DC | PRN
  Administered 2021-06-03: 1 mg via INTRAVENOUS

## 2021-06-03 MED ORDER — HEPARIN SODIUM (PORCINE) 1000 UNIT/ML IJ SOLN
INTRAMUSCULAR | Status: AC
  Filled 2021-06-03: qty 1

## 2021-06-03 MED ORDER — FENTANYL CITRATE (PF) 100 MCG/2ML IJ SOLN
INTRAMUSCULAR | Status: DC | PRN
  Administered 2021-06-03: 25 ug via INTRAVENOUS

## 2021-06-03 MED ORDER — SODIUM CHLORIDE 0.9 % IV SOLN: INTRAVENOUS | Status: DC

## 2021-06-03 MED ORDER — HYDRALAZINE HCL 20 MG/ML IJ SOLN: 10.0000 mg | INTRAMUSCULAR | Status: AC | PRN

## 2021-06-03 MED ORDER — HEPARIN SODIUM (PORCINE) 1000 UNIT/ML IJ SOLN
INTRAMUSCULAR | Status: DC | PRN
  Administered 2021-06-03: 5000 [IU] via INTRAVENOUS

## 2021-06-03 MED ORDER — SODIUM CHLORIDE 0.9 % IV SOLN
INTRAVENOUS | Status: AC | PRN
  Administered 2021-06-03: 10 mL/h via INTRAVENOUS

## 2021-06-03 MED ORDER — ONDANSETRON HCL 4 MG/2ML IJ SOLN: 4.0000 mg | Freq: Four times a day (QID) | INTRAMUSCULAR | Status: DC | PRN

## 2021-06-03 MED ORDER — LIDOCAINE HCL (PF) 1 % IJ SOLN
INTRAMUSCULAR | Status: AC
  Filled 2021-06-03: qty 30

## 2021-06-03 MED ORDER — SODIUM CHLORIDE 0.9% FLUSH
3.0000 mL | Freq: Two times a day (BID) | INTRAVENOUS | Status: DC
  Administered 2021-06-04: 3 mL via INTRAVENOUS

## 2021-06-03 MED ORDER — HEPARIN (PORCINE) IN NACL 1000-0.9 UT/500ML-% IV SOLN
INTRAVENOUS | Status: DC | PRN
  Administered 2021-06-03 (×3): 500 mL

## 2021-06-03 MED ORDER — LIDOCAINE HCL (PF) 1 % IJ SOLN
INTRAMUSCULAR | Status: DC | PRN
  Administered 2021-06-03 (×2): 2 mL

## 2021-06-03 MED ORDER — SODIUM CHLORIDE 0.9% FLUSH
3.0000 mL | INTRAVENOUS | Status: DC | PRN
  Administered 2021-06-03: 3 mL via INTRAVENOUS

## 2021-06-03 MED ORDER — IOHEXOL 350 MG/ML SOLN
INTRAVENOUS | Status: DC | PRN
  Administered 2021-06-03: 30 mL

## 2021-06-03 MED ORDER — SODIUM CHLORIDE 0.9 % IV SOLN: 250.0000 mL | INTRAVENOUS | Status: DC | PRN

## 2021-06-03 MED ORDER — HEPARIN (PORCINE) IN NACL 1000-0.9 UT/500ML-% IV SOLN
INTRAVENOUS | Status: AC
  Filled 2021-06-03: qty 1500

## 2021-06-03 MED ORDER — MIDAZOLAM HCL 2 MG/2ML IJ SOLN
INTRAMUSCULAR | Status: AC
  Filled 2021-06-03: qty 2

## 2021-06-03 MED ORDER — APIXABAN 5 MG PO TABS
5.0000 mg | ORAL_TABLET | Freq: Two times a day (BID) | ORAL | Status: DC
  Administered 2021-06-03 – 2021-06-04 (×2): 5 mg via ORAL
  Filled 2021-06-03 (×2): qty 1

## 2021-06-03 MED ORDER — VERAPAMIL HCL 2.5 MG/ML IV SOLN
INTRAVENOUS | Status: DC | PRN
  Administered 2021-06-03: 10 mL via INTRA_ARTERIAL

## 2021-06-03 MED ORDER — VERAPAMIL HCL 2.5 MG/ML IV SOLN
INTRAVENOUS | Status: AC
  Filled 2021-06-03: qty 2

## 2021-06-03 MED ORDER — SODIUM CHLORIDE 0.9% FLUSH: 3.0000 mL | INTRAVENOUS | Status: DC | PRN

## 2021-06-03 MED ORDER — LABETALOL HCL 5 MG/ML IV SOLN: 10.0000 mg | INTRAVENOUS | Status: AC | PRN

## 2021-06-03 MED ORDER — FENTANYL CITRATE (PF) 100 MCG/2ML IJ SOLN
INTRAMUSCULAR | Status: AC
  Filled 2021-06-03: qty 2

## 2021-06-03 SURGICAL SUPPLY — 12 items
CATH 5FR JL3.5 JR4 ANG PIG MP (CATHETERS) ×1 IMPLANT
CATH BALLN WEDGE 5F 110CM (CATHETERS) ×1 IMPLANT
DEVICE RAD COMP TR BAND LRG (VASCULAR PRODUCTS) ×1 IMPLANT
GLIDESHEATH SLEND SS 6F .021 (SHEATH) ×1 IMPLANT
GUIDEWIRE INQWIRE 1.5J.035X260 (WIRE) IMPLANT
INQWIRE 1.5J .035X260CM (WIRE) ×2
KIT HEART LEFT (KITS) ×2 IMPLANT
PACK CARDIAC CATHETERIZATION (CUSTOM PROCEDURE TRAY) ×2 IMPLANT
SHEATH GLIDE SLENDER 4/5FR (SHEATH) ×1 IMPLANT
SHEATH PROBE COVER 6X72 (BAG) ×1 IMPLANT
TRANSDUCER W/STOPCOCK (MISCELLANEOUS) ×2 IMPLANT
TUBING CIL FLEX 10 FLL-RA (TUBING) ×1 IMPLANT

## 2021-06-03 NOTE — Care Management Important Message (Signed)
Important Message  Patient Details  Name: KAYL STOGDILL MRN: [id number] Date of Birth: 05-14-1956   Medicare Important Message Given:  Yes     Shelda Altes 06/03/2021, 10:54 AM

## 2021-06-03 NOTE — Progress Notes (Signed)
Remote ICD transmission.   

## 2021-06-03 NOTE — Progress Notes (Signed)
NAME:  [name] [name], MRN:  [id number], DOB:  [birth year], LOS: 6 ADMISSION DATE:  05/28/2021, CONSULTATION DATE:  06/02/21 REFERRING MD:  Shirlee Latch, CHIEF COMPLAINT:  pulmonary aneurysms  History of Present Illness:  [name] [name] is a 65 y/o gentleman referred to the hospital for admission due to abnormal CT findings are concerning for endocarditis with mycotic pulmonary aneurysms.  He originally had a CT abdomen to evaluate urinary complaints.  This demonstrated nodules in his lungs.  Follow-up imaging with contrast demonstrated pulmonary arterial aneurysms and possibly small distal PEs.  He was admitted to the hospital due to concern for mycotic aneurysms and need for evaluation and treatment of possible endocarditis.  Throughout this admission his cultures have remained negative and TEE was unrevealing today.  He does not have symptoms suggestive of chronic infection such as fever, weight loss.  Pulmonology was asked to reconsult for these aneurysms. Previously consulted 7/14, and it was felt to be most likely mycotic aneurysms versus vasculitis.   He has no history of autoimmune disease.  He has a significant cardiac history.  He has had hemoptysis in the past, always associated with epistaxis and self-limited.  He does not regularly have bleeding from his gums or other places.  He has never had unexplained hemoptysis.  He has no pulmonary complaints.  He has no history of synovitis, rashes, muscle aches.  He has had a few ulcers at the outer edges of his mouth, but never intraoral ulcers.  He has never had genital ulcers that he is aware of.  No family or personal history of rheumatologic disease.  He does not endorse any uncontrolled symptoms currently.  Pertinent  Medical History  Afib Anemia Cardiac arrest in 1998 HFrEF, NICM HTN Quit smoking ~2000  Significant Hospital Events: Including procedures, antibiotic start and stop dates in addition to other pertinent events   7/14 admitted for  possible endocarditis 7/19 negative TEE for vegetations, all cultures negative  Interim History / Subjective:  [name] [name] denies complaints. 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 He denies history of myalgias, temporal pain, frequent headaches.   Objective   Blood pressure 108/76, pulse 75, temperature 98.3 F (36.8 C), temperature source Oral, resp. rate 15, height 6\' 4"  (1.93 m), weight 106.7 kg, SpO2 99 %.        Intake/Output Summary (Last 24 hours) at 06/03/2021 1559 Last data filed at 06/03/2021 1519 Gross per 24 hour  Intake --  Output 1650 ml  Net -1650 ml    Filed Weights   05/31/21 0501 06/01/21 0455 06/02/21 0617  Weight: 104.5 kg 104.5 kg 106.7 kg    Examination: General: elderly man lying in bed in NAD HENT: [place]/AT, eyes anicteric, edentulous Lungs: breathing comfortably on RA, CTAB Cardiovascular: S1S2, RRR Abdomen: soft, ND, NT Extremities: no cyanosis or edema Neuro: sleepy but arouses to verbal stimulation Derm: Skin warm, dry, no rashes.  Normal skin elasticity.    Resolved Hospital Problem list     Assessment & Plan:  Multiple pulmonary artery aneurysms.  History does not suggest uncontrolled symptoms that would typically be associated with an autoimmune disease, but elevated ESR and CRP could suggest Behcet's, which lacks typical serologic findings other than elevated inflammatory markers.  ANCA's are negative suggesting against ANCA positive vasculitis causes. No physical exam findings or symptoms to suggest temporal arteritis. No other large vessel inflammation on imaging to suggest Takayasu arteritis.  - PH confirmed on RHC; may not tolerate pulmonary artery embolization well -Checking RPR  and quantiferon gold to assess for uncommon causes of mycotic aneurysms -Unlikely that pulmonary aneurysms are caused by previous PEs.  2 establish this causation we would need documentation of previous PEs and those segmental arteries.  Pulmonary hypertension;  unclear if this is related to pulmonary aneurysms -Appreciate cardiology's management    Best Practice (right click and "Reselect all SmartList Selections" daily)   Deferred to primary  Labs   CBC: Recent Labs  Lab 05/28/21 1350 05/29/21 0142 05/30/21 0247 05/31/21 0118 06/01/21 0616 06/02/21 0131 06/03/21 0507  WBC 4.8   < > 4.0 3.8* 4.4 5.0 4.2  NEUTROABS 3.2  --   --   --   --   --   --   HGB 12.8*   < > 13.3 12.5* 12.8* 12.8* 12.1*  HCT 39.1   < > 39.3 37.8* 37.8* 38.9* 35.9*  MCV 91.8   < > 87.9 89.4 88.7 89.6 89.3  PLT 175   < > 189 179 176 158 141*   < > = values in this interval not displayed.     Basic Metabolic Panel: Recent Labs  Lab 05/28/21 1350 05/29/21 0201 05/31/21 1010 06/01/21 0616 06/03/21 0507  NA 134* 134* 135  --  135  K 4.6 4.1 4.7  --  4.1  CL 103 106 106  --  107  CO2 25 21* 22  --  21*  GLUCOSE 98 116* 97  --  94  BUN $Re'21 20 14  'HmF$ --  15  CREATININE 1.36* 1.22 1.12 1.13 1.40*  CALCIUM 9.3 9.3 10.0  --  9.6    GFR: Estimated Creatinine Clearance: 72.4 mL/min (A) (by C-G formula based on SCr of 1.4 mg/dL (H)). Recent Labs  Lab 05/28/21 1339 05/28/21 1350 05/31/21 0118 06/01/21 0616 06/02/21 0131 06/03/21 0507  WBC  --    < > 3.8* 4.4 5.0 4.2  LATICACIDVEN 1.4  --   --   --   --   --    < > = values in this interval not displayed.      Julian Hy, DO 06/03/21 5:38 PM [facility]

## 2021-06-03 NOTE — Progress Notes (Signed)
Patient ID: BARTLETT ENKE, male   DOB: 11-22-1955, 65 y.o.   MRN: [id number]     Advanced Heart Failure Rounding Note  PCP-Cardiologist: Rozann Lesches, MD   Subjective:    No complaints this morning.  Diuresed last night, denies dyspnea.    Objective:   Weight Range: 106.7 kg Body mass index is 28.64 kg/m.   Vital Signs:   Temp:  [98.2 F (36.8 C)-100 F (37.8 C)] 98.3 F (36.8 C) (07/20 0813) Pulse Rate:  [77-116] 81 (07/20 0813) Resp:  [16-32] 17 (07/20 0755) BP: (79-118)/(44-81) 118/75 (07/20 0755) SpO2:  [93 %-100 %] 98 % (07/20 0813) Last BM Date: 06/01/21  Weight change: Filed Weights   05/31/21 0501 06/01/21 0455 06/02/21 0617  Weight: 104.5 kg 104.5 kg 106.7 kg    Intake/Output:   Intake/Output Summary (Last 24 hours) at 06/03/2021 0846 Last data filed at 06/03/2021 0836 Gross per 24 hour  Intake 400 ml  Output 1675 ml  Net -1275 ml      Physical Exam    General: NAD Neck: No JVD, no thyromegaly or thyroid nodule.  Lungs: Clear to auscultation bilaterally with normal respiratory effort. CV: Nondisplaced PMI.  Heart regular S1/S2, no S3/S4, no murmur.  No peripheral edema.   Abdomen: Soft, nontender, no hepatosplenomegaly, no distention.  Skin: Intact without lesions or rashes.  Neurologic: Alert and oriented x 3.  Psych: Normal affect. Extremities: No clubbing or cyanosis.  HEENT: Normal.   Telemetry   Atrial fibrillation 80s-90s (personally reviewed)  Labs    CBC Recent Labs    06/02/21 0131 06/03/21 0507  WBC 5.0 4.2  HGB 12.8* 12.1*  HCT 38.9* 35.9*  MCV 89.6 89.3  PLT 158 694*   Basic Metabolic Panel Recent Labs    05/31/21 1010 06/01/21 0616 06/03/21 0507  NA 135  --  135  K 4.7  --  4.1  CL 106  --  107  CO2 22  --  21*  GLUCOSE 97  --  94  BUN 14  --  15  CREATININE 1.12 1.13 1.40*  CALCIUM 10.0  --  9.6   Liver Function Tests Recent Labs    05/31/21 1010  AST 15  ALT 11  ALKPHOS 43  BILITOT 0.6  PROT  7.3  ALBUMIN 3.1*   No results for input(s): LIPASE, AMYLASE in the last 72 hours. Cardiac Enzymes No results for input(s): CKTOTAL, CKMB, CKMBINDEX, TROPONINI in the last 72 hours.  BNP: BNP (last 3 results) No results for input(s): BNP in the last 8760 hours.  ProBNP (last 3 results) No results for input(s): PROBNP in the last 8760 hours.   D-Dimer No results for input(s): DDIMER in the last 72 hours. Hemoglobin A1C No results for input(s): HGBA1C in the last 72 hours. Fasting Lipid Panel No results for input(s): CHOL, HDL, LDLCALC, TRIG, CHOLHDL, LDLDIRECT in the last 72 hours. Thyroid Function Tests No results for input(s): TSH, T4TOTAL, T3FREE, THYROIDAB in the last 72 hours.  Invalid input(s): FREET3  Other results:   Imaging    ECHO TEE  Result Date: 06/02/2021    TRANSESOPHOGEAL ECHO REPORT   Patient Name:   CAYSON KALB Date of Exam: 06/02/2021 Medical Rec #:  [id number]         Height:       76.0 in Accession #:    [id number]        Weight:       235.3 lb Date of Birth:  02-05-1956         BSA:          2.374 m Patient Age:    45 years          BP:           117/72 mmHg Patient Gender: M                 HR:           108 bpm. Exam Location:  Inpatient Procedure: Transesophageal Echo, Color Doppler and Cardiac Doppler Indications:     Bacteremia  History:         Patient has prior history of Echocardiogram examinations, most                  recent 05/29/2021. Cardiomyopathy, Arrythmias:Atrial                  Fibrillation; Risk Factors:Hypertension.  Sonographer:     Bernadene Person RDCS Referring Phys:  [id number] Abigail Butts Diagnosing Phys: Kirk Ruths MD PROCEDURE: After discussion of the risks and benefits of a TEE, an informed consent was obtained from the patient. The transesophogeal probe was passed without difficulty through the esophogus of the patient. Sedation performed by different physician. The patient was monitored while under deep sedation.  Anesthestetic sedation was provided intravenously by Anesthesiology: 313.41mg  of Propofol, 40mg  of Lidocaine. The patient developed no complications during the procedure. IMPRESSIONS  1. No vegetations.  2. Left ventricular ejection fraction, by estimation, is 40 to 45%. The left ventricle has mild to moderately decreased function. The left ventricle demonstrates global hypokinesis. The left ventricular internal cavity size was mildly dilated.  3. Right ventricular systolic function is moderately reduced. The right ventricular size is mildly enlarged.  4. Left atrial size was moderately dilated. No left atrial/left atrial appendage thrombus was detected.  5. Right atrial size was severely dilated.  6. Moderate pericardial effusion.  7. The mitral valve is normal in structure. Mild mitral valve regurgitation.  8. Tricuspid valve regurgitation is severe.  9. The aortic valve is tricuspid. Aortic valve regurgitation is not visualized. 10. There is mild (Grade II) plaque involving the descending aorta. FINDINGS  Left Ventricle: Left ventricular ejection fraction, by estimation, is 40 to 45%. The left ventricle has mild to moderately decreased function. The left ventricle demonstrates global hypokinesis. The left ventricular internal cavity size was mildly dilated. Right Ventricle: The right ventricular size is mildly enlarged. Right ventricular systolic function is moderately reduced. Left Atrium: Left atrial size was moderately dilated. No left atrial/left atrial appendage thrombus was detected. Right Atrium: Right atrial size was severely dilated. Pericardium: A moderately sized pericardial effusion is present. Mitral Valve: The mitral valve is normal in structure. Mild mitral valve regurgitation. Tricuspid Valve: The tricuspid valve is grossly normal. Tricuspid valve regurgitation is severe. Aortic Valve: The aortic valve is tricuspid. Aortic valve regurgitation is not visualized. Pulmonic Valve: The pulmonic valve  was normal in structure. Pulmonic valve regurgitation is not visualized. Aorta: The aortic root is normal in size and structure. There is mild (Grade II) plaque involving the descending aorta. IAS/Shunts: There is left bowing of the interatrial septum, suggestive of elevated right atrial pressure. No atrial level shunt detected by color flow Doppler. Additional Comments: No vegetations. A device lead is visualized.  TRICUSPID VALVE TR Peak grad:   41.2 mmHg TR Vmax:        321.00 cm/s Kirk Ruths MD Electronically signed by Kirk Ruths  MD Signature Date/Time: 06/02/2021/1:30:33 PM    Final      Medications:     Scheduled Medications:  carvedilol  3.125 mg Oral BID WC   cholecalciferol  2,000 Units Oral Daily   dapagliflozin propanediol  10 mg Oral Daily   enoxaparin   Does not apply Once   finasteride  5 mg Oral Daily   furosemide  40 mg Intravenous BID   multivitamin with minerals  1 tablet Oral Daily   potassium chloride  10 mEq Oral Daily   pravastatin  40 mg Oral QHS   sacubitril-valsartan  1 tablet Oral BID   sodium chloride flush  3 mL Intravenous Q12H   sodium chloride flush  3 mL Intravenous Q12H   sodium chloride flush  3 mL Intravenous Q12H   spironolactone  12.5 mg Oral Daily   Warfarin - Pharmacist Dosing Inpatient   Does not apply q1600    Infusions:  sodium chloride     sodium chloride     sodium chloride 10 mL/hr at 06/03/21 0752   heparin 2,100 Units/hr (06/03/21 0254)    PRN Medications: sodium chloride, sodium chloride, acetaminophen **OR** acetaminophen, bisacodyl, ondansetron **OR** ondansetron (ZOFRAN) IV, polyethylene glycol, sodium chloride flush, sodium chloride flush   Assessment/Plan   1. Acute on chronic systolic CHF with prominent RV failure: Cardiomyopathy x years. 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 Said to have NICM but Echo showed EF 35-40%, D-shaped IV septum with mildly decreased RV systolic  function and moderate RVE, moderate pericardial effusion, severe TR, dilated IVC, PASP 74 mmHg. Seems to have predominant RV failure.  Creatinine mildly higher at 1.4 today, has had 2 doses of IV Lasix.  On exam, does not look significantly volume overloaded.  - Hold Lasix, gentle pre-cath hydration.  - Continue Coreg, Entresto, spironolactone, dapagliflozin. - Will plan RHC/LHC today to assess coronaries and also PA pressure.  Discussed risks/benefits with patient, he agrees to procedure. - I suspect that his ICD is not MRI compatible (old model) so probably will not be able to get cardiac MRI. 2. Pulmonary hypertension: PA systolic pressure elevated on RHC, RV failure with severe TR.  Cause of PH + RV failure uncertain.  Patient has segmental PA aneurysms (see below).  ?Chronic PEs => CTA looked like possible small segmental PEs. - RHC as above to assess filling pressures/PA pressure. - V/Q scan rule out chronic PEs (ordered). 3. Multiple segmental pulmonary artery aneurysms: Noted on CTA prior to admission.  Cause is uncertain.  Initially thought to be mycotic aneurysms, but TEE showed no endocarditis and blood cultures negative.  I asked Dr. Carlis Abbott with pulmonary to review the CTA, he may have small PEs but this does not appear to explain the pulmonary aneurysms.  ?Inflammatory disease such as Behcet's syndrome with elevated ESR and CRP.  ANCA and ANA negative.  He does not have a prominent history of mucocutaneous ulcers or other findings suggestive of Behcets.   - Pulmonary following - V/Q scan to assess for evidence for chronic PEs. - RHC to assess PA pressure as above. 4. Atrial fibrillation: Chronic/permanent. - Plan currently for heparin/Lovenox => warfarin bridge, INR 1.4.  Hold heparin for cath later today.  - ?Reassess anticoagulation => should we keep him on apixaban? Will discuss with pulmonary/[facility].  5. PE: Possible small segmental PEs on CTA. - Anticoagulation as above.   Length of  Stay: 6  Loralie Champagne, MD  06/03/2021,  8:46 AM  Advanced Heart Failure Team Pager [phone] (M-F; 7a - 5p)  Please contact [facility] for night-coverage after hours (5p -7a ) and weekends on [email or web address]

## 2021-06-03 NOTE — Interval H&P Note (Signed)
History and Physical Interval Note:  06/03/2021 1:36 PM  [name] [name]  has presented today for surgery, with the diagnosis of heart failure.  The various methods of treatment have been discussed with the patient and family. After consideration of risks, benefits and other options for treatment, the patient has consented to  Procedure(s): RIGHT/LEFT HEART CATH AND CORONARY ANGIOGRAPHY (N/A) as a surgical intervention.  The patient's history has been reviewed, patient examined, no change in status, stable for surgery.  I have reviewed the patient's chart and labs.  Questions were answered to the patient's satisfaction.     Kaitlyne Friedhoff [employer]

## 2021-06-03 NOTE — H&P (View-Only) (Signed)
Patient ID: [name] [name], male   DOB: [birth year], 65 y.o.   MRN: [id number]     Advanced Heart Failure Rounding Note  PCP-Cardiologist: Rozann Lesches, MD   Subjective:    No complaints this morning.  Diuresed last night, denies dyspnea.    Objective:   Weight Range: 106.7 kg Body mass index is 28.64 kg/m.   Vital Signs:   Temp:  [98.2 F (36.8 C)-100 F (37.8 C)] 98.3 F (36.8 C) (07/20 0813) Pulse Rate:  [77-116] 81 (07/20 0813) Resp:  [16-32] 17 (07/20 0755) BP: (79-118)/(44-81) 118/75 (07/20 0755) SpO2:  [93 %-100 %] 98 % (07/20 0813) Last BM Date: 06/01/21  Weight change: Filed Weights   05/31/21 0501 06/01/21 0455 06/02/21 0617  Weight: 104.5 kg 104.5 kg 106.7 kg    Intake/Output:   Intake/Output Summary (Last 24 hours) at 06/03/2021 0846 Last data filed at 06/03/2021 0836 Gross per 24 hour  Intake 400 ml  Output 1675 ml  Net -1275 ml      Physical Exam    General: NAD Neck: No JVD, no thyromegaly or thyroid nodule.  Lungs: Clear to auscultation bilaterally with normal respiratory effort. CV: Nondisplaced PMI.  Heart regular S1/S2, no S3/S4, no murmur.  No peripheral edema.   Abdomen: Soft, nontender, no hepatosplenomegaly, no distention.  Skin: Intact without lesions or rashes.  Neurologic: Alert and oriented x 3.  Psych: Normal affect. Extremities: No clubbing or cyanosis.  HEENT: Normal.   Telemetry   Atrial fibrillation 80s-90s (personally reviewed)  Labs    CBC Recent Labs    06/02/21 0131 06/03/21 0507  WBC 5.0 4.2  HGB 12.8* 12.1*  HCT 38.9* 35.9*  MCV 89.6 89.3  PLT 158 818*   Basic Metabolic Panel Recent Labs    05/31/21 1010 06/01/21 0616 06/03/21 0507  NA 135  --  135  K 4.7  --  4.1  CL 106  --  107  CO2 22  --  21*  GLUCOSE 97  --  94  BUN 14  --  15  CREATININE 1.12 1.13 1.40*  CALCIUM 10.0  --  9.6   Liver Function Tests Recent Labs    05/31/21 1010  AST 15  ALT 11  ALKPHOS 43  BILITOT 0.6  PROT  7.3  ALBUMIN 3.1*   No results for input(s): LIPASE, AMYLASE in the last 72 hours. Cardiac Enzymes No results for input(s): CKTOTAL, CKMB, CKMBINDEX, TROPONINI in the last 72 hours.  BNP: BNP (last 3 results) No results for input(s): BNP in the last 8760 hours.  ProBNP (last 3 results) No results for input(s): PROBNP in the last 8760 hours.   D-Dimer No results for input(s): DDIMER in the last 72 hours. Hemoglobin A1C No results for input(s): HGBA1C in the last 72 hours. Fasting Lipid Panel No results for input(s): CHOL, HDL, LDLCALC, TRIG, CHOLHDL, LDLDIRECT in the last 72 hours. Thyroid Function Tests No results for input(s): TSH, T4TOTAL, T3FREE, THYROIDAB in the last 72 hours.  Invalid input(s): FREET3  Other results:   Imaging    ECHO TEE  Result Date: 06/02/2021    TRANSESOPHOGEAL ECHO REPORT   Patient Name:   [name] [name] Date of Exam: 06/02/2021 Medical Rec #:  [id number]         Height:       76.0 in Accession #:    [id number]        Weight:       235.3 lb Date of Birth:  1956-11-11         BSA:          2.374 m Patient Age:    63 years          BP:           117/72 mmHg Patient Gender: M                 HR:           108 bpm. Exam Location:  Inpatient Procedure: Transesophageal Echo, Color Doppler and Cardiac Doppler Indications:     Bacteremia  History:         Patient has prior history of Echocardiogram examinations, most                  recent 05/29/2021. Cardiomyopathy, Arrythmias:Atrial                  Fibrillation; Risk Factors:Hypertension.  Sonographer:     Bernadene Person RDCS Referring Phys:  [id number] Abigail Butts Diagnosing Phys: Kirk Ruths MD PROCEDURE: After discussion of the risks and benefits of a TEE, an informed consent was obtained from the patient. The transesophogeal probe was passed without difficulty through the esophogus of the patient. Sedation performed by different physician. The patient was monitored while under deep sedation.  Anesthestetic sedation was provided intravenously by Anesthesiology: 313.41mg  of Propofol, 40mg  of Lidocaine. The patient developed no complications during the procedure. IMPRESSIONS  1. No vegetations.  2. Left ventricular ejection fraction, by estimation, is 40 to 45%. The left ventricle has mild to moderately decreased function. The left ventricle demonstrates global hypokinesis. The left ventricular internal cavity size was mildly dilated.  3. Right ventricular systolic function is moderately reduced. The right ventricular size is mildly enlarged.  4. Left atrial size was moderately dilated. No left atrial/left atrial appendage thrombus was detected.  5. Right atrial size was severely dilated.  6. Moderate pericardial effusion.  7. The mitral valve is normal in structure. Mild mitral valve regurgitation.  8. Tricuspid valve regurgitation is severe.  9. The aortic valve is tricuspid. Aortic valve regurgitation is not visualized. 10. There is mild (Grade II) plaque involving the descending aorta. FINDINGS  Left Ventricle: Left ventricular ejection fraction, by estimation, is 40 to 45%. The left ventricle has mild to moderately decreased function. The left ventricle demonstrates global hypokinesis. The left ventricular internal cavity size was mildly dilated. Right Ventricle: The right ventricular size is mildly enlarged. Right ventricular systolic function is moderately reduced. Left Atrium: Left atrial size was moderately dilated. No left atrial/left atrial appendage thrombus was detected. Right Atrium: Right atrial size was severely dilated. Pericardium: A moderately sized pericardial effusion is present. Mitral Valve: The mitral valve is normal in structure. Mild mitral valve regurgitation. Tricuspid Valve: The tricuspid valve is grossly normal. Tricuspid valve regurgitation is severe. Aortic Valve: The aortic valve is tricuspid. Aortic valve regurgitation is not visualized. Pulmonic Valve: The pulmonic valve  was normal in structure. Pulmonic valve regurgitation is not visualized. Aorta: The aortic root is normal in size and structure. There is mild (Grade II) plaque involving the descending aorta. IAS/Shunts: There is left bowing of the interatrial septum, suggestive of elevated right atrial pressure. No atrial level shunt detected by color flow Doppler. Additional Comments: No vegetations. A device lead is visualized.  TRICUSPID VALVE TR Peak grad:   41.2 mmHg TR Vmax:        321.00 cm/s Kirk Ruths MD Electronically signed by Kirk Ruths  MD Signature Date/Time: 06/02/2021/1:30:33 PM    Final      Medications:     Scheduled Medications:  carvedilol  3.125 mg Oral BID WC   cholecalciferol  2,000 Units Oral Daily   dapagliflozin propanediol  10 mg Oral Daily   enoxaparin   Does not apply Once   finasteride  5 mg Oral Daily   furosemide  40 mg Intravenous BID   multivitamin with minerals  1 tablet Oral Daily   potassium chloride  10 mEq Oral Daily   pravastatin  40 mg Oral QHS   sacubitril-valsartan  1 tablet Oral BID   sodium chloride flush  3 mL Intravenous Q12H   sodium chloride flush  3 mL Intravenous Q12H   sodium chloride flush  3 mL Intravenous Q12H   spironolactone  12.5 mg Oral Daily   Warfarin - Pharmacist Dosing Inpatient   Does not apply q1600    Infusions:  sodium chloride     sodium chloride     sodium chloride 10 mL/hr at 06/03/21 0752   heparin 2,100 Units/hr (06/03/21 0254)    PRN Medications: sodium chloride, sodium chloride, acetaminophen **OR** acetaminophen, bisacodyl, ondansetron **OR** ondansetron (ZOFRAN) IV, polyethylene glycol, sodium chloride flush, sodium chloride flush   Assessment/Plan   1. Acute on chronic systolic CHF with prominent RV failure: Cardiomyopathy x years. 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 Said to have NICM but Echo showed EF 35-40%, D-shaped IV septum with mildly decreased RV systolic  function and moderate RVE, moderate pericardial effusion, severe TR, dilated IVC, PASP 74 mmHg. Seems to have predominant RV failure.  Creatinine mildly higher at 1.4 today, has had 2 doses of IV Lasix.  On exam, does not look significantly volume overloaded.  - Hold Lasix, gentle pre-cath hydration.  - Continue Coreg, Entresto, spironolactone, dapagliflozin. - Will plan RHC/LHC today to assess coronaries and also PA pressure.  Discussed risks/benefits with patient, he agrees to procedure. - I suspect that his ICD is not MRI compatible (old model) so probably will not be able to get cardiac MRI. 2. Pulmonary hypertension: PA systolic pressure elevated on RHC, RV failure with severe TR.  Cause of PH + RV failure uncertain.  Patient has segmental PA aneurysms (see below).  ?Chronic PEs => CTA looked like possible small segmental PEs. - RHC as above to assess filling pressures/PA pressure. - V/Q scan rule out chronic PEs (ordered). 3. Multiple segmental pulmonary artery aneurysms: Noted on CTA prior to admission.  Cause is uncertain.  Initially thought to be mycotic aneurysms, but TEE showed no endocarditis and blood cultures negative.  I asked Dr. Carlis Abbott with pulmonary to review the CTA, he may have small PEs but this does not appear to explain the pulmonary aneurysms.  ?Inflammatory disease such as Behcet's syndrome with elevated ESR and CRP.  ANCA and ANA negative.  He does not have a prominent history of mucocutaneous ulcers or other findings suggestive of Behcets.   - Pulmonary following - V/Q scan to assess for evidence for chronic PEs. - RHC to assess PA pressure as above. 4. Atrial fibrillation: Chronic/permanent. - Plan currently for heparin/Lovenox => warfarin bridge, INR 1.4.  Hold heparin for cath later today.  - ?Reassess anticoagulation => should we keep him on apixaban? Will discuss with pulmonary/[facility].  5. PE: Possible small segmental PEs on CTA. - Anticoagulation as above.   Length of  Stay: 6  Loralie Champagne, MD  06/03/2021,  8:46 AM  Advanced Heart Failure Team Pager [phone] (M-F; 7a - 5p)  Please contact [facility] for night-coverage after hours (5p -7a ) and weekends on [email or web address]

## 2021-06-03 NOTE — Progress Notes (Addendum)
Sneads for heparin  Indication: pulmonary embolus 05/25/21 No Known Allergies  Patient Measurements: Height: 6\' 4"  (193 cm) Weight: 106.7 kg (235 lb 4.8 oz) IBW/kg (Calculated) : 86.8 Heparin Dosing Weight: 105 kg  Vital Signs: Temp: 98.3 F (36.8 C) (07/20 0813) Temp Source: Oral (07/20 0813) BP: 118/75 (07/20 0755) Pulse Rate: 81 (07/20 0813)  Labs: Recent Labs    06/01/21 0616 06/02/21 0131 06/03/21 0507 06/03/21 0956  HGB 12.8* 12.8* 12.1*  --   HCT 37.8* 38.9* 35.9*  --   PLT 176 158 141*  --   LABPROT  --   --  17.3*  --   INR  --   --  1.4*  --   HEPARINUNFRC 0.31 0.21*  --  0.38  CREATININE 1.13  --  1.40*  --      Estimated Creatinine Clearance: 72.4 mL/min (A) (by C-G formula based on SCr of 1.4 mg/dL (H)).   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 Patient was on apixaban PTA for afib, last dose 7/14 ~0630. Pharmacy consulted to switch heparin to warfarin with enoxaparin bridge.  Later in day decision - plan R/L Heart cath  Will stop enoxaparin and warfarin  Restarted heparin 12hr after enoxaparin dose Heparin drip 2100 uts/hr heparin level 0.38 at goal No bleeding note cbc ok   Goal of Therapy:  INR 2- 3 Monitor platelets by anticoagulation protocol: Yes  Plan:   heparin drip 2100 uts/hr  Monitor daily INR, heparin level CBC/plt Monitor for  signs/symptoms of bleeding   Bonnita Nasuti Pharm.D. CPP, BCPS Clinical Pharmacist [phone] 06/03/2021 12:00 PM  Please check AMION for all [employer] phone numbers After 10:00 PM, call [place] [phone]   PM addendum Post cath  R/o CTEPH PE are not new acute  Has had 6 days therapeutic heparin drip Resume apixaban 5mg  BID tonight   Bonnita Nasuti Pharm.D. CPP, BCPS Clinical Pharmacist [phone] 06/03/2021 4:30 PM

## 2021-06-03 NOTE — Progress Notes (Signed)
Pt received from cath lab AxOx4, VS wnL and as per flow. (R) TR band and (R) brachial dressing in place. Sites C/D/I w/ no adverse effects. All questions and concerns addressed. Call bell placed within reach, will continue to monitor and maintain safety.

## 2021-06-03 NOTE — Progress Notes (Signed)
PROGRESS NOTE    [name]  GGY:[id number] DOB: 01-07-1956 DOA: 05/28/2021 PCP: Glenda Chroman, MD    Chief Complaint  Patient presents with   abnormal ct    Brief Narrative:  [name] [name] is a 65yo male with past medical history significant for chronic anemia, permanent A. fib, ICD placement status post cardiac arrest, combined systolic and diastolic heart failure, nonischemic cardiomyopathy, hypertension who presented to the emergency department with concerns of abnormal chest CT findings suspicious for multifocal mycotic aneurysms.  Patient was transferred to [facility].  He was started on empiric antibiotics, underwent TEE 7/19.  Subjective:  Patient seen prior to heart cath, he denies chest pain, no cough  Assessment & Plan:   Principal Problem:   Pulmonary artery aneurysm (HCC) Active Problems:   Atrial fibrillation (HCC)   Chronic systolic heart failure (HCC)   Implantable cardioverter-defibrillator (ICD) in situ   Cardiomyopathy, nonischemic (HCC)   Essential hypertension, benign   Chronic anticoagulation   Pulmonary embolism (HCC)   Multiple pulmonary artery aneurysm -Underwent TEE no endocarditis, culture no growth, ID recommends stopping antibiotics -Pulmonology consulted, will follow recommendation  Acute on chronic systolic CHF/right heart failure -On Coreg/Entresto -Appreciate cardiology consult, cardiac catheterization today  Pulmonary hypertension VQ scan ordered to rule out chronic PEs  A. Fib Currently on Coreg and apixaban  Elevated TSH Free T4 elevated at 1.18 Repeat TSH in 4 to 6 weeks   Body mass index is 28.64 kg/m..  .     Unresulted Labs (From admission, onward)     Start     Ordered   06/04/21 0500  CBC  Tomorrow morning,   R       Question:  Specimen collection method  Answer:  Lab=Lab collect   06/03/21 1531   06/04/21 0350  Basic metabolic panel  Tomorrow morning,   R       Question:  Specimen  collection method  Answer:  Lab=Lab collect   06/03/21 1531   06/03/21 1550  RPR  Once,   R       Question:  Specimen collection method  Answer:  Lab=Lab collect   06/03/21 1550   06/03/21 1550  QuantiFERON-TB Gold Plus  Once,   R       Question:  Specimen collection method  Answer:  Lab=Lab collect   06/03/21 1550   05/29/21 0500  Creatinine, serum  Every Mon-Wed-Fri (0500),   R      05/28/21 1433              DVT prophylaxis: SCD's Start: 06/03/21 1503 TED hose Start: 05/28/21 1457 SCDs Start: 05/28/21 1456 Place TED hose Start: 05/28/21 1456 apixaban (ELIQUIS) tablet 5 mg   Code Status: Full Family Communication: Patient Disposition:   Status is: Inpatient   Dispo: The patient is from: Home              Anticipated d/c is to: Home              Anticipated d/c date is: To be determined                Consultants:  Cardiology Pulmonology ID  Procedures:  Cardiac cath  Antimicrobials:    Anti-infectives (From admission, onward)    Start     Dose/Rate Route Frequency Ordered Stop   06/02/21 0930  vancomycin (VANCOREADY) IVPB 1500 mg/300 mL  Status:  Discontinued        1,500 mg 150  mL/hr over 120 Minutes Intravenous Every 24 hours 06/01/21 1834 06/02/21 1052   05/29/21 0400  vancomycin (VANCOCIN) IVPB 1000 mg/200 mL premix  Status:  Discontinued        1,000 mg 200 mL/hr over 60 Minutes Intravenous Every 12 hours 05/28/21 1433 06/01/21 1834   05/28/21 1500  vancomycin (VANCOREADY) IVPB 2000 mg/400 mL        2,000 mg 200 mL/hr over 120 Minutes Intravenous  Once 05/28/21 1429 05/28/21 1853   05/28/21 1430  ceFEPIme (MAXIPIME) 2 g in sodium chloride 0.9 % 100 mL IVPB  Status:  Discontinued        2 g 200 mL/hr over 30 Minutes Intravenous  Once 05/28/21 1424 05/28/21 1428   05/28/21 1430  metroNIDAZOLE (FLAGYL) IVPB 500 mg        500 mg 100 mL/hr over 60 Minutes Intravenous  Once 05/28/21 1424 05/28/21 1627   05/28/21 1430  vancomycin (VANCOCIN) IVPB 1000  mg/200 mL premix  Status:  Discontinued        1,000 mg 200 mL/hr over 60 Minutes Intravenous  Once 05/28/21 1424 05/28/21 1429   05/28/21 1430  ceFEPIme (MAXIPIME) 2 g in sodium chloride 0.9 % 100 mL IVPB  Status:  Discontinued        2 g 200 mL/hr over 30 Minutes Intravenous Every 8 hours 05/28/21 1428 06/02/21 1052          Objective: Vitals:   06/03/21 1510 06/03/21 1525 06/03/21 1555 06/03/21 1800  BP: 98/73 103/69 108/76 114/68  Pulse:    69  Resp:      Temp:      TempSrc:      SpO2:    99%  Weight:      Height:        Intake/Output Summary (Last 24 hours) at 06/03/2021 1921 Last data filed at 06/03/2021 1519 Gross per 24 hour  Intake --  Output 950 ml  Net -950 ml   Filed Weights   05/31/21 0501 06/01/21 0455 06/02/21 0617  Weight: 104.5 kg 104.5 kg 106.7 kg    Examination:  General exam: calm, NAD Respiratory system: Clear to auscultation. Respiratory effort normal. Cardiovascular system: S1 & S2 heard, RRR. No JVD, no murmur, No pedal edema. Gastrointestinal system: Abdomen is nondistended, soft and nontender.  Normal bowel sounds heard. Central nervous system: Alert and oriented. No focal neurological deficits. Extremities: Symmetric 5 x 5 power. Skin: No rashes, lesions or ulcers Psychiatry: Judgement and insight appear normal. Mood & affect appropriate.     Data Reviewed: I have personally reviewed following labs and imaging studies  CBC: Recent Labs  Lab 05/28/21 1350 05/29/21 0142 05/30/21 0247 05/31/21 0118 06/01/21 0616 06/02/21 0131 06/03/21 0507  WBC 4.8   < > 4.0 3.8* 4.4 5.0 4.2  NEUTROABS 3.2  --   --   --   --   --   --   HGB 12.8*   < > 13.3 12.5* 12.8* 12.8* 12.1*  HCT 39.1   < > 39.3 37.8* 37.8* 38.9* 35.9*  MCV 91.8   < > 87.9 89.4 88.7 89.6 89.3  PLT 175   < > 189 179 176 158 141*   < > = values in this interval not displayed.    Basic Metabolic Panel: Recent Labs  Lab 05/28/21 1350 05/29/21 0201 05/31/21 1010  06/01/21 0616 06/03/21 0507  NA 134* 134* 135  --  135  K 4.6 4.1 4.7  --  4.1  CL 103 106 106  --  107  CO2 25 21* 22  --  21*  GLUCOSE 98 116* 97  --  94  BUN 21 20 14   --  15  CREATININE 1.36* 1.22 1.12 1.13 1.40*  CALCIUM 9.3 9.3 10.0  --  9.6    GFR: Estimated Creatinine Clearance: 72.4 mL/min (A) (by C-G formula based on SCr of 1.4 mg/dL (H)).  Liver Function Tests: Recent Labs  Lab 05/28/21 1350 05/31/21 1010  AST 10* 15  ALT 10 11  ALKPHOS 50 43  BILITOT 0.7 0.6  PROT 8.0 7.3  ALBUMIN 3.6 3.1*    CBG: No results for input(s): GLUCAP in the last 168 hours.   Recent Results (from the past 240 hour(s))  Urine Culture     Status: Abnormal   Collection Time: 05/28/21  1:17 PM   Specimen: Urine, Clean Catch  Result Value Ref Range Status   Specimen Description   Final    URINE, CLEAN CATCH Performed at [facility], [address], [place], [place] [address]    Special Requests   Final    NONE Performed at [facility], [address] [place], [place] [address]    Culture (A)  Final    <10,000 COLONIES/mL INSIGNIFICANT GROWTH Performed at [facility] [address] [place], [place] [address]    Report Status 05/30/2021 FINAL  Final  Blood Culture (routine x 2)     Status: None   Collection Time: 05/28/21  1:39 PM   Specimen: Right Antecubital; Blood  Result Value Ref Range Status   Specimen Description   Final    RIGHT ANTECUBITAL BOTTLES DRAWN AEROBIC AND ANAEROBIC   Special Requests Blood Culture adequate volume  Final   Culture   Final    NO GROWTH 5 DAYS Performed at [facility], [address], [place], [place] [address]    Report Status 06/02/2021 FINAL  Final  Blood Culture (routine x 2)     Status: None   Collection Time: 05/28/21  1:39 PM   Specimen: Left Antecubital; Blood  Result Value Ref Range Status   Specimen Description   Final    LEFT ANTECUBITAL BOTTLES DRAWN AEROBIC AND ANAEROBIC   Special Requests Blood Culture  adequate volume  Final   Culture   Final    NO GROWTH 5 DAYS Performed at [facility], [address] [place], [place] [address]    Report Status 06/02/2021 FINAL  Final  Resp Panel by RT-PCR (Flu A&B, Covid) Nasopharyngeal Swab     Status: None   Collection Time: 05/28/21  1:49 PM   Specimen: Nasopharyngeal Swab; Nasopharyngeal(NP) swabs in vial transport medium  Result Value Ref Range Status   SARS Coronavirus 2 by RT PCR NEGATIVE NEGATIVE Final    Comment: (NOTE) SARS-CoV-2 target nucleic acids are NOT DETECTED.  The SARS-CoV-2 RNA is generally detectable in upper respiratory specimens during the acute phase of infection. The lowest concentration of SARS-CoV-2 viral copies this assay can detect is 138 copies/mL. A negative result does not preclude SARS-Cov-2 infection and should not be used as the sole basis for treatment or other patient management decisions. A negative result may occur with  improper specimen collection/handling, submission of specimen other than nasopharyngeal swab, presence of viral mutation(s) within the areas targeted by this assay, and inadequate number of viral copies(<138 copies/mL). A negative result must be combined with clinical observations, patient history, and epidemiological information. The expected result is Negative.  Fact Sheet for Patients:  EntrepreneurPulse.com.au  Fact Sheet for Healthcare Providers:  IncredibleEmployment.be  This test is no t yet approved or cleared by the [place] FDA and  has been authorized for detection and/or diagnosis of SARS-CoV-2 by FDA under an Emergency Use Authorization (EUA). This EUA will remain  in effect (meaning this test can be used) for the duration of the COVID-19 declaration under Section 564(b)(1) of the Act, 21 U.S.C.section 360bbb-3(b)(1), unless the authorization is terminated  or revoked sooner.       Influenza A by PCR NEGATIVE NEGATIVE Final    Influenza B by PCR NEGATIVE NEGATIVE Final    Comment: (NOTE) The Xpert Xpress SARS-CoV-2/FLU/RSV plus assay is intended as an aid in the diagnosis of influenza from Nasopharyngeal swab specimens and should not be used as a sole basis for treatment. Nasal washings and aspirates are unacceptable for Xpert Xpress SARS-CoV-2/FLU/RSV testing.  Fact Sheet for Patients: EntrepreneurPulse.com.au  Fact Sheet for Healthcare Providers: IncredibleEmployment.be  This test is not yet approved or cleared by the [place] FDA and has been authorized for detection and/or diagnosis of SARS-CoV-2 by FDA under an Emergency Use Authorization (EUA). This EUA will remain in effect (meaning this test can be used) for the duration of the COVID-19 declaration under Section 564(b)(1) of the Act, 21 U.S.C. section 360bbb-3(b)(1), unless the authorization is terminated or revoked.  Performed at [facility], [address] [place], [place] [address]          Radiology Studies: CARDIAC CATHETERIZATION  Result Date: 06/03/2021 1. Normal coronaries. 2. Normal filling pressures. 3. Moderate pulmonary arterial hypertension. 4. Preserved cardiac output. Nonischemic cardiomyopathy. Cause of PAH is uncertain, cannot totally rule out CTEPH but suspect not likely.  V/Q scan pending.  Would consider treatment with Adempas (start as outpatient) for possible CTEPH given PAH and segmental PEs.  Discussed with Dr. Carlis Abbott, will put him back on Eliquis.   ECHO TEE  Result Date: 06/02/2021    TRANSESOPHOGEAL ECHO REPORT   Patient Name:   [name] [name] Date of Exam: 06/02/2021 Medical Rec #:  [id number]         Height:       76.0 in Accession #:    [id number]        Weight:       235.3 lb Date of Birth:  1956/10/12         BSA:          2.374 m Patient Age:    14 years          BP:           117/72 mmHg Patient Gender: M                 HR:           108 bpm. Exam Location:  Inpatient  Procedure: Transesophageal Echo, Color Doppler and Cardiac Doppler Indications:     Bacteremia  History:         Patient has prior history of Echocardiogram examinations, most                  recent 05/29/2021. Cardiomyopathy, Arrythmias:Atrial                  Fibrillation; Risk Factors:Hypertension.  Sonographer:     Bernadene Person RDCS Referring Phys:  [id number] Abigail Butts Diagnosing Phys: Kirk Ruths MD PROCEDURE: After discussion of the risks and benefits of a TEE, an informed consent  was obtained from the patient. The transesophogeal probe was passed without difficulty through the esophogus of the patient. Sedation performed by different physician. The patient was monitored while under deep sedation. Anesthestetic sedation was provided intravenously by Anesthesiology: 313.41mg  of Propofol, 40mg  of Lidocaine. The patient developed no complications during the procedure. IMPRESSIONS  1. No vegetations.  2. Left ventricular ejection fraction, by estimation, is 40 to 45%. The left ventricle has mild to moderately decreased function. The left ventricle demonstrates global hypokinesis. The left ventricular internal cavity size was mildly dilated.  3. Right ventricular systolic function is moderately reduced. The right ventricular size is mildly enlarged.  4. Left atrial size was moderately dilated. No left atrial/left atrial appendage thrombus was detected.  5. Right atrial size was severely dilated.  6. Moderate pericardial effusion.  7. The mitral valve is normal in structure. Mild mitral valve regurgitation.  8. Tricuspid valve regurgitation is severe.  9. The aortic valve is tricuspid. Aortic valve regurgitation is not visualized. 10. There is mild (Grade II) plaque involving the descending aorta. FINDINGS  Left Ventricle: Left ventricular ejection fraction, by estimation, is 40 to 45%. The left ventricle has mild to moderately decreased function. The left ventricle demonstrates global hypokinesis. The  left ventricular internal cavity size was mildly dilated. Right Ventricle: The right ventricular size is mildly enlarged. Right ventricular systolic function is moderately reduced. Left Atrium: Left atrial size was moderately dilated. No left atrial/left atrial appendage thrombus was detected. Right Atrium: Right atrial size was severely dilated. Pericardium: A moderately sized pericardial effusion is present. Mitral Valve: The mitral valve is normal in structure. Mild mitral valve regurgitation. Tricuspid Valve: The tricuspid valve is grossly normal. Tricuspid valve regurgitation is severe. Aortic Valve: The aortic valve is tricuspid. Aortic valve regurgitation is not visualized. Pulmonic Valve: The pulmonic valve was normal in structure. Pulmonic valve regurgitation is not visualized. Aorta: The aortic root is normal in size and structure. There is mild (Grade II) plaque involving the descending aorta. IAS/Shunts: There is left bowing of the interatrial septum, suggestive of elevated right atrial pressure. No atrial level shunt detected by color flow Doppler. Additional Comments: No vegetations. A device lead is visualized.  TRICUSPID VALVE TR Peak grad:   41.2 mmHg TR Vmax:        321.00 cm/s Kirk Ruths MD Electronically signed by Kirk Ruths MD Signature Date/Time: 06/02/2021/1:30:33 PM    Final         Scheduled Meds:  apixaban  5 mg Oral BID   carvedilol  3.125 mg Oral BID WC   cholecalciferol  2,000 Units Oral Daily   dapagliflozin propanediol  10 mg Oral Daily   finasteride  5 mg Oral Daily   multivitamin with minerals  1 tablet Oral Daily   potassium chloride  10 mEq Oral Daily   pravastatin  40 mg Oral QHS   sacubitril-valsartan  1 tablet Oral BID   sodium chloride flush  3 mL Intravenous Q12H   sodium chloride flush  3 mL Intravenous Q12H   sodium chloride flush  3 mL Intravenous Q12H   sodium chloride flush  3 mL Intravenous Q12H   spironolactone  12.5 mg Oral Daily    Continuous Infusions:  sodium chloride     sodium chloride 75 mL/hr at 06/03/21 1519   sodium chloride       LOS: 6 days   Time spent: 50mins Greater than 50% of this time was spent in counseling, explanation of diagnosis, planning of  further management, and coordination of care.   Voice Recognition Viviann Spare dictation system was used to create this note, attempts have been made to correct errors. Please contact the author with questions and/or clarifications.   Florencia Reasons, MD PhD FACP [facility]  Available via Epic secure chat 7am-7pm for nonurgent issues Please page for urgent issues To page the attending provider between 7A-7P or the covering provider during after hours 7P-7A, please log into the web site [email or web address] and access using universal Unity password for that web site. If you do not have the password, please call the hospital operator.    06/03/2021, 7:21 PM

## 2021-06-03 NOTE — TOC Benefit Eligibility Note (Signed)
Transition of Care [facility]) Benefit Eligibility Note    Patient Details  Name: [name] [name] MRN: [id number] Date of Birth: Nov 27, 1955   Medication/Dose: ENOXAPARIN   100 MG  Q 12HRS FOR 7DAYS   14 SYRINGES  Covered?: Yes  Tier:  (NO TIER)  Prescription Coverage [facility]  Spoke with Person/Company/Phone Number:: JAN   @  [name] RX #  [phone] OPT-MEMBER  Co-Pay: ZERO DOLLARS  Prior Approval: Yes [phone])     Additional Notes: LOVENOX  100 MG Q 12 HRS FOR 7 DAYS  14 SYRINGES  : NOR COVER/ NON-FORMULARY   PRIOR APPROVAL- YES #  [phone]    Memory Argue Phone Number: 06/03/2021, 9:08 AM

## 2021-06-04 ENCOUNTER — Inpatient Hospital Stay (HOSPITAL_COMMUNITY): Payer: Medicare HMO

## 2021-06-04 ENCOUNTER — Other Ambulatory Visit (HOSPITAL_COMMUNITY): Payer: Self-pay

## 2021-06-04 ENCOUNTER — Encounter (HOSPITAL_COMMUNITY): Payer: Self-pay | Admitting: Cardiology

## 2021-06-04 LAB — POCT I-STAT EG7
Acid-base deficit: 2 mmol/L (ref 0.0–2.0)
Acid-base deficit: 1 mmol/L (ref 0.0–2.0)
Bicarbonate: 22.6 mmol/L (ref 20.0–28.0)
Bicarbonate: 23.1 mmol/L (ref 20.0–28.0)
Calcium, Ion: 1.34 mmol/L (ref 1.15–1.40)
Calcium, Ion: 1.4 mmol/L (ref 1.15–1.40)
HCT: 35 % — ABNORMAL LOW (ref 39.0–52.0)
HCT: 35 % — ABNORMAL LOW (ref 39.0–52.0)
Hemoglobin: 11.9 g/dL — ABNORMAL LOW (ref 13.0–17.0)
Hemoglobin: 11.9 g/dL — ABNORMAL LOW (ref 13.0–17.0)
O2 Saturation: 68 %
O2 Saturation: 67 %
Potassium: 3.8 mmol/L (ref 3.5–5.1)
Potassium: 3.9 mmol/L (ref 3.5–5.1)
Sodium: 141 mmol/L (ref 135–145)
Sodium: 141 mmol/L (ref 135–145)
TCO2: 24 mmol/L (ref 22–32)
TCO2: 24 mmol/L (ref 22–32)
pCO2, Ven: 36 mmHg — ABNORMAL LOW (ref 44.0–60.0)
pCO2, Ven: 36.7 mmHg — ABNORMAL LOW (ref 44.0–60.0)
pH, Ven: 7.406 (ref 7.250–7.430)
pH, Ven: 7.408 (ref 7.250–7.430)
pO2, Ven: 35 mmHg (ref 32.0–45.0)
pO2, Ven: 34 mmHg (ref 32.0–45.0)

## 2021-06-04 LAB — CBC
HCT: 36.2 % — ABNORMAL LOW (ref 39.0–52.0)
Hemoglobin: 11.8 g/dL — ABNORMAL LOW (ref 13.0–17.0)
MCH: 29.6 pg (ref 26.0–34.0)
MCHC: 32.6 g/dL (ref 30.0–36.0)
MCV: 90.7 fL (ref 80.0–100.0)
Platelets: 143 10*3/uL — ABNORMAL LOW (ref 150–400)
RBC: 3.99 MIL/uL — ABNORMAL LOW (ref 4.22–5.81)
RDW: 15.1 % (ref 11.5–15.5)
WBC: 5.3 10*3/uL (ref 4.0–10.5)
nRBC: 0 % (ref 0.0–0.2)

## 2021-06-04 LAB — BASIC METABOLIC PANEL
Anion gap: 9 (ref 5–15)
BUN: 17 mg/dL (ref 8–23)
CO2: 21 mmol/L — ABNORMAL LOW (ref 22–32)
Calcium: 9.6 mg/dL (ref 8.9–10.3)
Chloride: 105 mmol/L (ref 98–111)
Creatinine, Ser: 1.27 mg/dL — ABNORMAL HIGH (ref 0.61–1.24)
GFR, Estimated: >60 mL/min (ref >60)
Glucose, Bld: 94 mg/dL (ref 70–99)
Potassium: 4 mmol/L (ref 3.5–5.1)
Sodium: 135 mmol/L (ref 135–145)

## 2021-06-04 LAB — RPR: RPR Ser Ql: NONREACTIVE

## 2021-06-04 MED ORDER — TECHNETIUM TO 99M ALBUMIN AGGREGATED
4.4000 | Freq: Once | INTRAVENOUS | Status: AC | PRN
  Administered 2021-06-04: 4.4 via INTRAVENOUS

## 2021-06-04 MED ORDER — POTASSIUM CHLORIDE CRYS ER 20 MEQ PO TBCR
20.0000 meq | EXTENDED_RELEASE_TABLET | Freq: Every day | ORAL | 0 refills | Status: DC
  Filled 2021-06-04: qty 30, 30d supply, fill #0

## 2021-06-04 MED ORDER — POTASSIUM CHLORIDE CRYS ER 20 MEQ PO TBCR: 20.0000 meq | EXTENDED_RELEASE_TABLET | Freq: Every day | ORAL | Status: DC

## 2021-06-04 MED ORDER — DIPHENHYDRAMINE HCL 50 MG/ML IJ SOLN
12.5000 mg | Freq: Once | INTRAMUSCULAR | Status: AC
  Administered 2021-06-04: 12.5 mg via INTRAVENOUS
  Filled 2021-06-04: qty 1

## 2021-06-04 MED ORDER — ENTRESTO 24-26 MG PO TABS
1.0000 | ORAL_TABLET | Freq: Two times a day (BID) | ORAL | 0 refills | Status: DC
  Filled 2021-06-04: qty 60, 30d supply, fill #0

## 2021-06-04 MED ORDER — FUROSEMIDE 40 MG PO TABS: 40.0000 mg | ORAL_TABLET | Freq: Every day | ORAL | Status: DC

## 2021-06-04 MED ORDER — FUROSEMIDE 40 MG PO TABS
40.0000 mg | ORAL_TABLET | Freq: Every day | ORAL | 0 refills | Status: DC
  Filled 2021-06-04: qty 30, 30d supply, fill #0

## 2021-06-04 MED ORDER — CARVEDILOL 3.125 MG PO TABS
3.1250 mg | ORAL_TABLET | Freq: Two times a day (BID) | ORAL | 0 refills | Status: DC
  Filled 2021-06-04: qty 60, 30d supply, fill #0

## 2021-06-04 NOTE — Anesthesia Postprocedure Evaluation (Signed)
Anesthesia Post Note  Patient: [name] [name]  Procedure(s) Performed: TRANSESOPHAGEAL ECHOCARDIOGRAM (TEE)     Patient location during evaluation: PACU Anesthesia Type: MAC Level of consciousness: awake and alert Pain management: pain level controlled Vital Signs Assessment: post-procedure vital signs reviewed and stable Respiratory status: spontaneous breathing, nonlabored ventilation, respiratory function stable and patient connected to nasal cannula oxygen Cardiovascular status: stable and blood pressure returned to baseline Postop Assessment: no apparent nausea or vomiting Anesthetic complications: no   No notable events documented.              Effie Berkshire

## 2021-06-04 NOTE — TOC Benefit Eligibility Note (Signed)
Transition of Care [facility]) Benefit Eligibility Note    Patient Details  Name: LORETO LOESCHER MRN: [id number] Date of Birth: [birth year]   Medication/Dose: ENOXAPARIN   100 MG  Q 12HRS FOR 7DAYS   14 SYRINGES  Covered?: No  Tier:  (NO TIER)  Prescription Coverage [facility]  Spoke with Person/Company/Phone Number:: Tea  @   [facility]  HMO   #  [phone]  OPT-1  Co-Pay: Johnsie Kindred  Prior Approval: Yes [phone])    ADDITIONAL  NOTES    Memory Argue Phone Number: 06/04/2021, 2:33 PM

## 2021-06-04 NOTE — Progress Notes (Addendum)
Patient ID: [name] [name], male   DOB: 02/04/1956, 65 y.o.   MRN: [id number]     Advanced Heart Failure Rounding Note  PCP-Cardiologist: Rozann Lesches, MD   Subjective:    IV furosemide D/C'd yesterday. RHC with normal filling pressures, moderate pulmonary arterial hypertension, preserved cardiac output  Weight down another 4lb. Denies any dyspnea. No chest pain.   Objective:   Weight Range: 104.9 kg Body mass index is 28.15 kg/m.   Vital Signs:   Temp:  [98.3 F (36.8 C)-98.5 F (36.9 C)] 98.3 F (36.8 C) (07/21 0800) Pulse Rate:  [69-106] 106 (07/21 0800) Resp:  [12-18] 18 (07/21 0800) BP: (98-130)/(66-87) 114/84 (07/21 0800) SpO2:  [94 %-100 %] 96 % (07/21 0800) Weight:  [104.9 kg] 104.9 kg (07/21 0600) Last BM Date: 06/02/21  Weight change: Filed Weights   06/01/21 0455 06/02/21 0617 06/04/21 0600  Weight: 104.5 kg 106.7 kg 104.9 kg    Intake/Output:   Intake/Output Summary (Last 24 hours) at 06/04/2021 1057 Last data filed at 06/04/2021 0622 Gross per 24 hour  Intake 240 ml  Output 750 ml  Net -510 ml      Physical Exam    General: Appears well. No acute distress. Neck: No JVD, no thyromegaly or thyroid nodule.  Lungs: Clear to auscultation bilaterally  Cardiac: Rhythm is regular. S1, S2. No murmur, rub or gallop. Abdomen: Soft, nontender, no hepatosplenomegaly, no distention.  Skin: Intact without lesions or rashes.  Neurologic: Alert and oriented x 3.  Psych: Normal affect. Extremities: No clubbing or cyanosis. No edema. Compression stockings are on. HEENT: Normal.   Telemetry   Atrial fibrillation 90s-100s (personally reviewed)  Labs    CBC Recent Labs    06/03/21 0507 06/03/21 1359 06/04/21 0345  WBC 4.2  --  5.3  HGB 12.1* 11.9* 11.8*  HCT 35.9* 35.0* 36.2*  MCV 89.3  --  90.7  PLT 141*  --  580*   Basic Metabolic Panel Recent Labs    06/03/21 0507 06/03/21 1359 06/04/21 0345  NA 135 141 135  K 4.1 3.8 4.0  CL 107  --   105  CO2 21*  --  21*  GLUCOSE 94  --  94  BUN 15  --  17  CREATININE 1.40*  --  1.27*  CALCIUM 9.6  --  9.6   Liver Function Tests No results for input(s): AST, ALT, ALKPHOS, BILITOT, PROT, ALBUMIN in the last 72 hours.  No results for input(s): LIPASE, AMYLASE in the last 72 hours. Cardiac Enzymes No results for input(s): CKTOTAL, CKMB, CKMBINDEX, TROPONINI in the last 72 hours.  BNP: BNP (last 3 results) No results for input(s): BNP in the last 8760 hours.  ProBNP (last 3 results) No results for input(s): PROBNP in the last 8760 hours.   D-Dimer No results for input(s): DDIMER in the last 72 hours. Hemoglobin A1C No results for input(s): HGBA1C in the last 72 hours. Fasting Lipid Panel No results for input(s): CHOL, HDL, LDLCALC, TRIG, CHOLHDL, LDLDIRECT in the last 72 hours. Thyroid Function Tests No results for input(s): TSH, T4TOTAL, T3FREE, THYROIDAB in the last 72 hours.  Invalid input(s): FREET3  Other results:   Imaging    CARDIAC CATHETERIZATION  Result Date: 06/03/2021 1. Normal coronaries. 2. Normal filling pressures. 3. Moderate pulmonary arterial hypertension. 4. Preserved cardiac output. Nonischemic cardiomyopathy. Cause of PAH is uncertain, cannot totally rule out CTEPH but suspect not likely.  V/Q scan pending.  Would consider treatment with Adempas (  start as outpatient) for possible CTEPH given PAH and segmental PEs.  Discussed with Dr. Carlis Abbott, will put him back on Eliquis.   DG CHEST PORT 1 VIEW  Result Date: 06/04/2021 CLINICAL DATA:  Preoperative testing. EXAM: PORTABLE CHEST 1 VIEW COMPARISON:  05/28/2021. FINDINGS: AICD noted stable position. Cardiomegaly again noted. Low lung volumes. Multiple pulmonary nodules again noted, best identified on prior CT of 05/25/2021. No prominent pleural effusion. No pneumothorax. IMPRESSION: 1.  AICD in stable position.  Cardiomegaly again noted. 2. Low lung volumes. Multiple pulmonary nodules again noted, these  are best identified on prior CT of 05/25/2021. Electronically Signed   By: Marcello Moores  Register   On: 06/04/2021 08:00     Medications:     Scheduled Medications:  apixaban  5 mg Oral BID   carvedilol  3.125 mg Oral BID WC   cholecalciferol  2,000 Units Oral Daily   dapagliflozin propanediol  10 mg Oral Daily   finasteride  5 mg Oral Daily   multivitamin with minerals  1 tablet Oral Daily   potassium chloride  10 mEq Oral Daily   pravastatin  40 mg Oral QHS   sacubitril-valsartan  1 tablet Oral BID   sodium chloride flush  3 mL Intravenous Q12H   sodium chloride flush  3 mL Intravenous Q12H   sodium chloride flush  3 mL Intravenous Q12H   sodium chloride flush  3 mL Intravenous Q12H   spironolactone  12.5 mg Oral Daily    Infusions:  sodium chloride     sodium chloride      PRN Medications: sodium chloride, sodium chloride, acetaminophen, bisacodyl, ondansetron (ZOFRAN) IV, ondansetron **OR** [DISCONTINUED] ondansetron (ZOFRAN) IV, polyethylene glycol, sodium chloride flush, sodium chloride flush   Assessment/Plan   1. Acute on chronic systolic CHF with prominent RV failure: -Cardiomyopathy x years with MDT ICD since 1998 cardiac arrest. No significant CAD on LHC yesterday.  -Echo this admit showed EF 35-40%, D-shaped IV septum with mildly decreased RV systolic function and moderate RVE, moderate pericardial effusion, severe TR, dilated IVC, PASP 74 mmHg. Seems to have predominant RV failure.   -Last dose IV furosemide yesterday am. Normal filling pressures on RHC. Down another 4 lbs today. Appears euvolemic. Start po furosemide 40 mg daily tomorrow. -Continue Coreg, Entresto, spironolactone, dapagliflozin. -Suspect that his ICD is not MRI compatible (old model) so probably will not be able to get cardiac MRI.  2. Pulmonary hypertension:  -PA systolic pressure elevated on RHC, RV failure with severe TR.  Cause of PH + RV failure uncertain.  Patient has segmental PA aneurysms (see  below).  ?Chronic PEs => CTA looked like possible small segmental PEs. - V/Q scan rule out chronic PEs (pending) -Would consider treatment with Adempas (start as outpatient) for possible CTEPH given PAH and segmental PEs.  3. Multiple segmental pulmonary artery aneurysms:  -Noted on CTA prior to admission.  Cause is uncertain.  Initially thought to be mycotic aneurysms, but TEE showed no endocarditis and blood cultures negative.  RPR and Quantiferon pending. CTA reviewed with Dr. Carlis Abbott (pulmonary), he may have small PEs but this does not appear to explain the pulmonary aneurysms.  ?Inflammatory disease such as Behcet's syndrome with elevated ESR and CRP.  ANCA and ANA negative.  He does not have a prominent history of mucocutaneous ulcers or other findings suggestive of Behcets.   - Pulmonary following - V/Q scan pending assess for evidence for chronic PEs.  4. Atrial fibrillation:  -Chronic/permanent. Rates 90s-100s this  am.  -Restarted apixaban yesterday after discussion with pulmonary  5. PE:  -Possible small segmental PEs on CTA. -Anticoagulation as above.    SDOH -Has medicare. Medications (including eliquis, entresto and farxiga) are affordable.  -Lives with his brothers. Does not drive. Brother provides transportation. Case management assisting with arranging transportation to/from appointments.    Okay for discharge today from cardiac perspective.  Follow up in HF clinic scheduled.  D/C medications: Eliquis 5 mg BID Farxiga 10 mg daily Carvedilol 3.125 mg BID Entresto 24/26 mg BID Furosemide 40 mg daily Kdur 20 mEq daily Spiro 12.5 mg daily    Length of Stay: 7  Wylma Tatem N, PA-C  06/04/2021, 10:57 AM  Advanced Heart Failure Team Pager [phone] (M-F; 7a - 5p)  Please contact [facility] for night-coverage after hours (5p -7a ) and weekends on [email or web address]   Patient seen with PA, agree with the above note.   He feels good today.  Cath yesterday as below.    Coronary Findings   Diagnostic Dominance: Right  Left Main  Vessel was injected. Vessel is normal in caliber. Vessel is angiographically normal.  Left Anterior Descending  Vessel was injected. Vessel is normal in caliber. Vessel is angiographically normal.  Left Circumflex  Vessel was injected. Vessel is normal in caliber. Vessel is angiographically normal.  Right Coronary Artery  Vessel was injected. Vessel is normal in caliber. Vessel is angiographically normal.   Intervention   No interventions have been documented.    Right Heart  Right Heart Pressures RHC Procedural Findings: Hemodynamics (mmHg) RA mean 7 RV 52/4 PA 60/13, mean 29 PCWP mean 8 LV 97/5 AO 90/48  Oxygen saturations: PA 68% AO 100%  Cardiac Output (Fick) 6  Cardiac Index (Fick) 2.53 PVR 3.5 WU   General: NAD Neck: No JVD, no thyromegaly or thyroid nodule.  Lungs: Clear to auscultation bilaterally with normal respiratory effort. CV: Nondisplaced PMI.  Heart regular S1/S2, no S3/S4, no murmur.  No peripheral edema.   Abdomen: Soft, nontender, no hepatosplenomegaly, no distention.  Skin: Intact without lesions or rashes.  Neurologic: Alert and oriented x 3.  Psych: Normal affect. Extremities: No clubbing or cyanosis.  HEENT: Normal.   Continue current meds for cardiomyopathy, start back on Lasix 40 mg daily tomorrow.   Cause of pulmonary arterial hypertension as well as the segmental PA aneurysms is uncertain, cannot totally rule out CTEPH but suspect not likely.  V/Q scan read is pending.  Would consider treatment with Adempas (start as outpatient) for possible CTEPH given PAH and segmental PEs.  Discussed with Dr. Carlis Abbott, will keep him on Eliquis.   From my standpoint, he could go home today.  We will arrange followup in CHF clinic.  He will go home on the meds listed in PA's note.   Loralie Champagne 06/04/2021  1:16 PM

## 2021-06-04 NOTE — TOC Progression Note (Addendum)
Transition of Care (TOC) - Progression Note  Heart Failure   Patient Details  Name: [name] [name] MRN: [id number] Date of Birth: July 16, 1956  Transition of Care [facility]) CM/SW [place], [place] Phone Number: 06/04/2021, 3:58 PM  Clinical Narrative:    CSW spoke with the patient at bedside and completed a very brief SDOH with the patient who reported needing help with transportation. CSW obtained the patient's  signature for the rider waiver for [facility] transport and provided Mr. Matheny with the [facility] phone number to call when he needs a ride to any [facility] related appointment. CSW arranged for [facility] transportation for his HF outpatient follow up appointment. Mr. Iodice will call his health insurance to see if they offer further transportation services. Mr. Bibby reported that he didn't not want to pursue Food Stamps at this time but is aware of the [place], [place] if he changes his mind. CSW provided Mr. Staples with an appointment card for the [facility] outpatient clinic and encouraged him to follow up and to attend the appointment and bring his medications and if anything changes to please reach out so that CSW/HV clinic team can provide support.    Expected Discharge Plan: Home/Self Care Barriers to Discharge: Continued Medical Work up  Expected Discharge Plan and Services Expected Discharge Plan: Home/Self Care In-house Referral: Clinical Social Work Discharge Planning Services: CM Consult   Living arrangements for the past 2 months: Single Family Home                                       Social Determinants of Health (SDOH) Interventions Food Insecurity Interventions: Intervention Not Indicated Financial Strain Interventions: Intervention Not Indicated Housing Interventions: Intervention Not Indicated Transportation Interventions: [facility] Transportation Services  Readmission Risk Interventions No flowsheet data found.  Hussam Muniz, MSW,  [place] Heart Failure Social Worker

## 2021-06-04 NOTE — Discharge Summary (Signed)
Discharge Summary  [name] [name] JKD:[id number] DOB: 1956/03/25  PCP: Glenda Chroman, MD  Admit date: 05/28/2021 Discharge date: 06/04/2021  Time spent: 72mins, more than 50% time spent on coordination of care.  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   Chronic anticoagulation 05/28/2021   Pulmonary embolism ([place]) 05/28/2021   Cardiomyopathy, nonischemic (HCC)    Essential hypertension, benign    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 Denton Brick) [name] [name]  is a 65 y.o. 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  -Discussed with ID physician Dr. Juleen China who -Recommends blood cultures, TTE -Low threshold for TEE  pending blood culture results -Cefepime and Vancomycin for now pending further culture data -Pulmonology consult requested No fever  Or chills  He was sent to [place] ED by his urology due to abnormal Ct chest findings, he was transferred to [facility] for cardiology and pulmonology consult  Hospital Course:  Principal Problem:   Pulmonary artery aneurysm [facility]) Active Problems:   Atrial fibrillation ([place])   Chronic systolic heart failure ([place])   Implantable cardioverter-defibrillator (ICD) in situ   Cardiomyopathy, nonischemic ([place])   Essential hypertension, benign   Chronic anticoagulation   Pulmonary embolism ([place])   Multiple pulmonary artery aneurysm -Underwent TEE no endocarditis, culture no growth, ID recommends stopping antibiotics -Pulmonology consulted who ordered autoimmune panel  and rpr which are negative, pulmonology also also ordered Thana Farr from test which is pending at discharge -Case discussed with pulmonology Dr. Carlis Abbott prior to discharge who recommend follow-up with cardiology pulmonary hypertension clinic, patient does not need to follow with pulmonology    Acute on chronic systolic CHF/right heart failure -s/p right heart cath with RHC  with normal filling pressures, moderate pulmonary arterial hypertension, preserved cardiac output -On Coreg dose reduced to 3.125 bid /Entresto/Aldactone/Farxiga/lasix dose increased to 40mg  daily -Lasix dose increased to 40 mg daily at discharge per cardiology recommendation, cardiology also recommended  to increase potassium supplement from 10 mEq daily to 20 mEq daily -He is cleared to discharge home per cardiology   Pulmonary hypertension VQ scan ordered by cardiology to rule out chronic PEs, result pending at discharge Patient is cleared to discharge by cardiology with close follow-up on August 1 to decide on further management  He reports upper lip feeling swollen after received injection for VQ scan, on exam I did not see significant edema on upper lip, no tongue edema, he received iv benadryl x1, he is observed for 6hrs after benadryl injection, no significant upper lip edema on exam, he desires to go home, he is to follow up with pcp, he is aware to go the urgent care or ED if his lips were to become more swollen or develop tongue edema.   A. Fib, chronic  In afib, rate controlled  Currently on Coreg and apixaban   Elevated TSH Free T4 elevated at 1.18 Repeat TSH in 4 to 6 weeks    Body mass index is 28.64 kg/m.Marland Kitchen   Procedures: Right heart cath  Consultations: Cardiology Pulmonology Infectious disease  Discharge Exam: BP 129/80 (BP Location: Left Arm)   Pulse 90   Temp 98.2 F (36.8 C) (Oral)   Resp 18   Ht 6\' 4"  (1.93 m)   Wt 104.9 kg   SpO2 94%   BMI 28.15 kg/m   General: NAD, pleasant  Cardiovascular: IRRR Respiratory: CTABL  Discharge Instructions You were cared for by a hospitalist during your hospital stay. If you have any questions about your discharge medications or the care you received while you were in the hospital after you are discharged, you can call the unit and asked to speak with the hospitalist on call if the hospitalist that took care of you is not available. Once you are discharged, your primary care physician will handle any further medical issues. Please note that NO REFILLS for any discharge medications will be authorized once you are discharged, as it is imperative that you return to your primary care physician (or establish a relationship  with a primary care physician if you do not have one) for your aftercare needs so that they can reassess your need for medications and monitor your lab values.  Discharge Instructions     Diet - low sodium heart healthy   Complete by: As directed    Increase activity slowly   Complete by: As directed       Allergies as of 06/04/2021   No Known Allergies      Medication List     STOP taking these medications    potassium chloride 10 MEQ CR capsule Commonly known as: MICRO-K Replaced by: potassium chloride SA 20 MEQ tablet       TAKE these medications    carvedilol 3.125 MG tablet Commonly known as: COREG Take 1 tablet (3.125 mg total) by mouth 2 (two) times daily with a meal. What changed:  medication strength See the new instructions.   dapagliflozin propanediol 10 MG Tabs tablet Commonly known as: Farxiga Take 1 tablet (10 mg total) by mouth daily before breakfast.   Eliquis 5 MG Tabs tablet Generic drug: apixaban TAKE 1 TABLET BY MOUTH TWICE A DAY   Entresto 24-26 MG Generic drug: sacubitril-valsartan  Take 1 tablet by mouth 2 (two) times daily.   finasteride 5 MG tablet Commonly known as: PROSCAR TAKE 1 TABLET BY MOUTH EVERY DAY   furosemide 40 MG tablet Commonly known as: LASIX Take 1 tablet (40 mg total) by mouth daily. Start taking on: June 05, 2021 What changed:  medication strength how much to take   polyethylene glycol powder 17 GM/SCOOP powder Commonly known as: GLYCOLAX/MIRALAX Take 17 g by mouth daily as needed. For constipation - mix with 8 oz liquid and drink   potassium chloride SA 20 MEQ tablet Commonly known as: KLOR-CON Take 1 tablet (20 mEq total) by mouth daily. Start taking on: June 05, 2021 Replaces: potassium chloride 10 MEQ CR capsule   pravastatin 40 MG tablet Commonly known as: PRAVACHOL Take 40 mg by mouth daily.   spironolactone 25 MG tablet Commonly known as: ALDACTONE TAKE 1/2 TABLET BY MOUTH EVERY DAY    Vitamin D3 50 MCG (2000 UT) capsule Take 2,000 Units by mouth daily.       No Known Allergies  Follow-up Information     [facility] Follow up on 06/15/2021.   Specialty: Cardiology Why: Heart Failure Clinic at 2:20 pm [place] Code [id number] Contact information: [address] [id number] [place], Dhruv B, MD Follow up in 1 week(s).   Specialty: Internal Medicine Why: Hospital discharge follow-up, repeat cbc/bmp at follow up. pcp to follow up on pending lab tests Contact information: [place] [address] [phone]                  The results of significant diagnostics from this hospitalization (including imaging, microbiology, ancillary and laboratory) are listed below for reference.    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 There are small areas of PE as well as described in the initial report. The nodule in the RIGHT upper lobe just above the minor fissure with indistinct margins is likely new since the prior study which supports an infectious etiology as a unifying diagnosis. Would suggest attention on follow-up however to ensure resolution. These results will be called to the ordering clinician or representative by the Radiologist Assistant, and communication documented in the PACS or [employer]. Electronically Signed   By: Zetta Bills M.D.   On: 05/28/2021 07:59   Addendum Date: 05/27/2021   ADDENDUM REPORT: 05/27/2021 16:56 ADDENDUM: These results were called by telephone at the time of interpretation on 05/27/2021 at 4:55 pm to provider [facility] , who verbally acknowledged these results. Electronically Signed   By:  Zetta Bills M.D.   On: 05/27/2021 16:56   Result Date: 05/28/2021 CLINICAL DATA:  Follow-up numerous pulmonary nodules seen on previous abdominal CT from May of 2022. EXAM: CT CHEST WITH CONTRAST TECHNIQUE: Multidetector CT imaging of the chest was performed during intravenous contrast administration. CONTRAST:  66mL OMNIPAQUE IOHEXOL 300 MG/ML  SOLN COMPARISON:  Abdominal CT from Apr 09, 2021. FINDINGS: Cardiovascular: Small pericardial effusion similar volume with respect to visualized portions the heart seen on the previous imaging study. Heart size is enlarged. Signs of multi lead pacer defibrillator, leads in the RIGHT heart and coronary sinus as before. Aortic caliber is normal. Scattered aortic atherosclerosis. Scattered subsegmental  pulmonary emboli, for instance on image 98/5 in the posterior RIGHT upper lobe is a small branch embolus. Potential small RIGHT middle lobe embolus seen on image 46/5 also. Multiple sites of ectasias/aneurysmal dilation of segmental and subsegmental branches of the pulmonary artery mainly at the subsegmental level. (Image 87/5) 2 in the RIGHT upper lobe measuring approximately 7 mm. Adjacent to the small embolus in the LEFT upper lobe is another 7 mm area of dilation of the peripheral pulmonary vasculature. Subsegmental level dilation on image 77/2) 1 x 1 cm in the LEFT upper lobe. RIGHT middle lobe area measuring 13 x 11 mm (image 90/2) Largest involved area is actually a segmental branch with with small peripheral thrombus measuring 18 x 14 mm. Nodular area in the posterior RIGHT lower lobe measuring 18 x 17 mm appears to have diminished in size compared to the previous abdominal CT of Apr 09, 2021 where it measured approximately 2.5 x 2.1 cm. This shows some peripheral ground-glass. RIGHT upper lobe indistinct nodule in terms of nodular margins with surrounding ground-glass measuring 2.5 x 2.1 cm. Scattered smaller nodules elsewhere in the chest in the upper lobes  predominantly, for instance on image 41/4 a 9 mm RIGHT upper lobe nodule. Mediastinum/Nodes: Patulous esophagus. Mild enlargement of subcarinal nodal tissue approximately 14 mm (image 75/4) fullness of bilateral hilar lymph nodes largest on the RIGHT at 1.4 cm. No thoracic inlet adenopathy. No axillary lymphadenopathy. Lungs/Pleura: Signs of septal thickening at the RIGHT lung base showing a similar appearance to previous imaging likely a chronic process. Nodules some representing ectatic/aneurysmal pulmonary arterial structures as described. Upper Abdomen: Fissural widening of the liver. Suggestion of hepatic enlargement spleen also likely enlarged with calcified splenic lesion as before. No acute upper abdominal process. Musculoskeletal: No acute bone finding or destructive bone lesion. IMPRESSION: 1. Multiple sites of ectasia/aneurysmal dilation of the segmental and subsegmental branches of the pulmonary artery mainly at the subsegmental level. Findings may be due to chronic and recurrent thromboembolic phenomenon or could be related to atypical infection/septic emboli. Blood culture correlation may be helpful. 2. Superimposed parenchymal nodules are nonspecific, would favor sequela of atypical infection and even fungal pneumonia. Neoplasm is however not entirely excluded. Would also correlate with any recent history of COVID-19 infection. Pulmonary consultation for above findings is suggested. 3. Mild nodal enlargement nonspecific in a patient with cardiac disease, potentially reactive. Consider short interval follow-up for further evaluation. 4. Scattered smaller nodules elsewhere in the chest in the upper lobes predominantly in the upper lobes. 5. Chronic interstitial changes at the RIGHT lung base. 6. Small pericardial effusion. 7. Question cardiogenic cirrhosis based on appearance of the liver in the setting of cardiac dysfunction. 8. Aortic atherosclerosis. Aortic Atherosclerosis (ICD10-I70.0).  Electronically Signed: By: Zetta Bills M.D. On: 05/27/2021 16:29   NM Pulmonary Perfusion  Result Date: 06/04/2021 CLINICAL DATA:  Pulmonary emboli suspected. Chest pain. Shortness of breath. COPD and emphysema. EXAM: NUCLEAR MEDICINE PERFUSION LUNG SCAN TECHNIQUE: Perfusion images were obtained in multiple projections after intravenous injection of radiopharmaceutical. Ventilation scans intentionally deferred if perfusion scan and chest x-ray adequate for interpretation during COVID 19 epidemic. RADIOPHARMACEUTICALS:  4.4 mCi Tc-73m MAA IV COMPARISON:  Chest radiography same day FINDINGS: Left lung perfusion is normal. Right lung perfusion shows abnormal diminished perfusion in the lower lobe. Correlating with a CT angiogram from 05/25/2021, there is blind-ending/occlusion of the right lower lobe pulmonary artery serving the basal segments. This does not appear to be due to embolic disease however. There  are no other subsegmental abnormal findings to suggest embolic disease of an acute nature. IMPRESSION: No acute emboli are suspected. There is blind ending of the right lower lobe pulmonary artery serving the basal segments of the right lower lobe as shown by previous CT angiography. This does not appear to be due to acute embolic disease however. Electronically Signed   By: Nelson Chimes M.D.   On: 06/04/2021 13:25   CARDIAC CATHETERIZATION  Result Date: 06/03/2021 1. Normal coronaries. 2. Normal filling pressures. 3. Moderate pulmonary arterial hypertension. 4. Preserved cardiac output. Nonischemic cardiomyopathy. Cause of PAH is uncertain, cannot totally rule out CTEPH but suspect not likely.  V/Q scan pending.  Would consider treatment with Adempas (start as outpatient) for possible CTEPH given PAH and segmental PEs.  Discussed with Dr. Carlis Abbott, will put him back on Eliquis.   US Venous Img Lower Bilateral (DVT)  Result Date: 05/28/2021 CLINICAL DATA:  History of septic embolism. Abnormal CT.  History of pulmonary embolism. Evaluate for DVT. EXAM: BILATERAL LOWER EXTREMITY VENOUS DOPPLER ULTRASOUND TECHNIQUE: Gray-scale sonography with graded compression, as well as color Doppler and duplex ultrasound were performed to evaluate the lower extremity deep venous systems from the level of the common femoral vein and including the common femoral, femoral, profunda femoral, popliteal and calf veins including the posterior tibial, peroneal and gastrocnemius veins when visible. The superficial great saphenous vein was also interrogated. Spectral Doppler was utilized to evaluate flow at rest and with distal augmentation maneuvers in the common femoral, femoral and popliteal veins. COMPARISON:  None. FINDINGS: RIGHT LOWER EXTREMITY Common Femoral Vein: No evidence of thrombus. Normal compressibility, respiratory phasicity and response to augmentation. Saphenofemoral Junction: No evidence of thrombus. Normal compressibility and flow on color Doppler imaging. Profunda Femoral Vein: No evidence of thrombus. Normal compressibility and flow on color Doppler imaging. Femoral Vein: No evidence of thrombus. Normal compressibility, respiratory phasicity and response to augmentation. Popliteal Vein: No evidence of thrombus. Normal compressibility, respiratory phasicity and response to augmentation. Calf Veins: No evidence of thrombus. Normal compressibility and flow on color Doppler imaging. Superficial Great Saphenous Vein: No evidence of thrombus. Normal compressibility. Venous Reflux:  None. Other Findings:  None. LEFT LOWER EXTREMITY Common Femoral Vein: No evidence of thrombus. Normal compressibility, respiratory phasicity and response to augmentation. Saphenofemoral Junction: No evidence of thrombus. Normal compressibility and flow on color Doppler imaging. Profunda Femoral Vein: No evidence of thrombus. Normal compressibility and flow on color Doppler imaging. Femoral Vein: No evidence of thrombus. Normal  compressibility, respiratory phasicity and response to augmentation. Popliteal Vein: No evidence of thrombus. Normal compressibility, respiratory phasicity and response to augmentation. Calf Veins: No evidence of thrombus. Normal compressibility and flow on color Doppler imaging. Superficial Great Saphenous Vein: No evidence of thrombus. Normal compressibility. Venous Reflux:  None. Other Findings:  None. IMPRESSION: No evidence of DVT within either lower extremity. Electronically Signed   By: Sandi Mariscal M.D.   On: 05/28/2021 17:36   DG CHEST PORT 1 VIEW  Result Date: 06/04/2021 CLINICAL DATA:  Preoperative testing. EXAM: PORTABLE CHEST 1 VIEW COMPARISON:  05/28/2021. FINDINGS: AICD noted stable position. Cardiomegaly again noted. Low lung volumes. Multiple pulmonary nodules again noted, best identified on prior CT of 05/25/2021. No prominent pleural effusion. No pneumothorax. IMPRESSION: 1.  AICD in stable position.  Cardiomegaly again noted. 2. Low lung volumes. Multiple pulmonary nodules again noted, these are best identified on prior CT of 05/25/2021. Electronically Signed   By: Marcello Moores  Register   On: 06/04/2021  08:00   DG Chest Port 1 View  Result Date: 05/28/2021 CLINICAL DATA:  Question sepsis EXAM: PORTABLE CHEST 1 VIEW COMPARISON:  Chest x-ray 05/13/2017.  Chest CT 05/25/2021 FINDINGS: Cardiac enlargement. AICD in satisfactory position. Negative for heart failure. No pleural effusion Approximately 15 mm ill-defined nodules in the right lung as noted on CT. IMPRESSION: Stable  lung nodules on the right, nonspecific in appearance. Negative for heart failure or pneumonia. Electronically Signed   By: Franchot Gallo M.D.   On: 05/28/2021 13:54   ECHOCARDIOGRAM COMPLETE  Result Date: 05/29/2021    ECHOCARDIOGRAM REPORT   Patient Name:   JEMARIO POITRAS Date of Exam: 05/29/2021 Medical Rec #:  [id number]         Height:       76.0 in Accession #:    [id number]        Weight:       238.4 lb Date of  Birth:  05/09/1956         BSA:          2.387 m Patient Age:    17 years          BP:           102/71 mmHg Patient Gender: M                 HR:           105 bpm. Exam Location:  Inpatient Procedure: 2D Echo, Cardiac Doppler and Color Doppler Indications:    Endocarditis  History:        Patient has prior history of Echocardiogram examinations, most                 recent 11/29/2019. Cardiomyopathy and CHF, Arrythmias:Atrial                 Fibrillation; Risk Factors:Hypertension. 04/12/2012 pacer lead                 extraction.  Sonographer:    Luisa Hart RDCS Referring Phys: [id number] COURAGE [place]  1. Left ventricular ejection fraction, by estimation, is 35 to 40%. The left ventricle has moderately decreased function. The left ventricle demonstrates global hypokinesis. Left ventricular diastolic function could not be evaluated. There is the interventricular septum is flattened in systole and diastole, consistent with right ventricular pressure and volume overload.  2. Right ventricular systolic function is mildly reduced. The right ventricular size is moderately enlarged. There is severely elevated pulmonary artery systolic pressure.  3. Left atrial size was mild to moderately dilated.  4. Right atrial size was severely dilated.  5. Moderate pericardial effusion. The pericardial effusion is circumferential. There is no evidence of cardiac tamponade.  6. The mitral valve is normal in structure. Trivial mitral valve regurgitation.  7. The tricuspid valve is abnormal. Tricuspid valve regurgitation is severe.  8. The aortic valve is tricuspid. Aortic valve regurgitation is not visualized.  9. The inferior vena cava is dilated in size with <50% respiratory variability, suggesting right atrial pressure of 15 mmHg. Comparison(s): Prior images reviewed side by side. The pericardial effusion is larger compared to January 2021. No other significant changes are seen. Conclusion(s)/Recommendation(s): No  evidence of valvular vegetations on this transthoracic echocardiogram. Would recommend a transesophageal echocardiogram to exclude infective endocarditis if clinically indicated. FINDINGS  Left Ventricle: Left ventricular ejection fraction, by estimation, is 35 to 40%. The left ventricle has moderately decreased function. The left ventricle demonstrates global hypokinesis. The left ventricular  internal cavity size was normal in size. There is no left ventricular hypertrophy. The interventricular septum is flattened in systole and diastole, consistent with right ventricular pressure and volume overload. Left ventricular diastolic function could not be evaluated due to atrial fibrillation. Left ventricular diastolic function could not be evaluated. Right Ventricle: The right ventricular size is moderately enlarged. No increase in right ventricular wall thickness. Right ventricular systolic function is mildly reduced. There is severely elevated pulmonary artery systolic pressure. The tricuspid regurgitant velocity is 3.85 m/s, and with an assumed right atrial pressure of 15 mmHg, the estimated right ventricular systolic pressure is 50.9 mmHg. Left Atrium: Left atrial size was mild to moderately dilated. Right Atrium: Right atrial size was severely dilated. Pericardium: A moderately sized pericardial effusion is present. The pericardial effusion is circumferential. There is no evidence of cardiac tamponade. Mitral Valve: The mitral valve is normal in structure. Trivial mitral valve regurgitation. Tricuspid Valve: The tricuspid valve is abnormal. Tricuspid valve regurgitation is severe. Aortic Valve: The aortic valve is tricuspid. Aortic valve regurgitation is not visualized. Aortic valve mean gradient measures 2.0 mmHg. Aortic valve peak gradient measures 4.1 mmHg. Aortic valve area, by VTI measures 5.58 cm. Pulmonic Valve: The pulmonic valve was normal in structure. Pulmonic valve regurgitation is not visualized.  Aorta: The aortic root and ascending aorta are structurally normal, with no evidence of dilitation. Venous: The inferior vena cava is dilated in size with less than 50% respiratory variability, suggesting right atrial pressure of 15 mmHg. IAS/Shunts: No atrial level shunt detected by color flow Doppler. Additional Comments: A device lead is visualized in the right ventricle.  LEFT VENTRICLE PLAX 2D LVOT diam:     2.70 cm LV SV:         89 LV SV Index:   37 LVOT Area:     5.73 cm  LV Volumes (MOD) LV vol d, MOD A2C: 97.9 ml LV vol d, MOD A4C: 76.4 ml LV vol s, MOD A2C: 57.9 ml LV vol s, MOD A4C: 54.8 ml LV SV MOD A2C:     40.0 ml LV SV MOD A4C:     76.4 ml LV SV MOD BP:      31.7 ml RIGHT VENTRICLE RV Basal diam:  3.70 cm RV Mid diam:    3.30 cm RV S prime:     10.60 cm/s TAPSE (M-mode): 1.6 cm LEFT ATRIUM             Index       RIGHT ATRIUM           Index LA diam:        5.30 cm 2.22 cm/m  RA Area:     31.10 cm LA Vol (A2C):   97.1 ml 40.68 ml/m RA Volume:   117.00 ml 49.02 ml/m LA Vol (A4C):   61.9 ml 25.93 ml/m LA Biplane Vol: 85.2 ml 35.69 ml/m  AORTIC VALVE                   PULMONIC VALVE AV Area (Vmax):    5.27 cm    PV Vmax:       1.06 m/s AV Area (Vmean):   5.66 cm    PV Vmean:      58.000 cm/s AV Area (VTI):     5.58 cm    PV VTI:        0.146 m AV Vmax:           101.00 cm/s PV Peak  grad:  4.5 mmHg AV Vmean:          65.000 cm/s PV Mean grad:  2.0 mmHg AV VTI:            0.159 m AV Peak Grad:      4.1 mmHg AV Mean Grad:      2.0 mmHg LVOT Vmax:         93.00 cm/s LVOT Vmean:        64.200 cm/s LVOT VTI:          0.155 m LVOT/AV VTI ratio: 0.97  AORTA Ao Root diam: 3.40 cm Ao Asc diam:  3.30 cm MITRAL VALVE                TRICUSPID VALVE MV Area (PHT): 3.23 cm     TR Peak grad:   59.3 mmHg MV Decel Time: 235 msec     TR Vmax:        385.00 cm/s MV E velocity: 106.00 cm/s                             SHUNTS                             Systemic VTI:  0.16 m                             Systemic  Diam: 2.70 cm Dani Gobble Croitoru MD Electronically signed by Sanda Klein MD Signature Date/Time: 05/29/2021/12:49:29 PM    Final    ECHO TEE  Result Date: 06/02/2021    TRANSESOPHOGEAL ECHO REPORT   Patient Name:   WILLET SCHLEIFER Date of Exam: 06/02/2021 Medical Rec #:  [id number]         Height:       76.0 in Accession #:    [id number]        Weight:       235.3 lb Date of Birth:  January 29, 1956         BSA:          2.374 m Patient Age:    20 years          BP:           117/72 mmHg Patient Gender: M                 HR:           108 bpm. Exam Location:  Inpatient Procedure: Transesophageal Echo, Color Doppler and Cardiac Doppler Indications:     Bacteremia  History:         Patient has prior history of Echocardiogram examinations, most                  recent 05/29/2021. Cardiomyopathy, Arrythmias:Atrial                  Fibrillation; Risk Factors:Hypertension.  Sonographer:     Bernadene Person RDCS Referring Phys:  [id number] Abigail Butts Diagnosing Phys: Kirk Ruths MD PROCEDURE: After discussion of the risks and benefits of a TEE, an informed consent was obtained from the patient. The transesophogeal probe was passed without difficulty through the esophogus of the patient. Sedation performed by different physician. The patient was monitored while under deep sedation. Anesthestetic sedation was provided intravenously by Anesthesiology: 313.41mg  of Propofol, 40mg  of Lidocaine. The patient developed  no complications during the procedure. IMPRESSIONS  1. No vegetations.  2. Left ventricular ejection fraction, by estimation, is 40 to 45%. The left ventricle has mild to moderately decreased function. The left ventricle demonstrates global hypokinesis. The left ventricular internal cavity size was mildly dilated.  3. Right ventricular systolic function is moderately reduced. The right ventricular size is mildly enlarged.  4. Left atrial size was moderately dilated. No left atrial/left atrial appendage thrombus was  detected.  5. Right atrial size was severely dilated.  6. Moderate pericardial effusion.  7. The mitral valve is normal in structure. Mild mitral valve regurgitation.  8. Tricuspid valve regurgitation is severe.  9. The aortic valve is tricuspid. Aortic valve regurgitation is not visualized. 10. There is mild (Grade II) plaque involving the descending aorta. FINDINGS  Left Ventricle: Left ventricular ejection fraction, by estimation, is 40 to 45%. The left ventricle has mild to moderately decreased function. The left ventricle demonstrates global hypokinesis. The left ventricular internal cavity size was mildly dilated. Right Ventricle: The right ventricular size is mildly enlarged. Right ventricular systolic function is moderately reduced. Left Atrium: Left atrial size was moderately dilated. No left atrial/left atrial appendage thrombus was detected. Right Atrium: Right atrial size was severely dilated. Pericardium: A moderately sized pericardial effusion is present. Mitral Valve: The mitral valve is normal in structure. Mild mitral valve regurgitation. Tricuspid Valve: The tricuspid valve is grossly normal. Tricuspid valve regurgitation is severe. Aortic Valve: The aortic valve is tricuspid. Aortic valve regurgitation is not visualized. Pulmonic Valve: The pulmonic valve was normal in structure. Pulmonic valve regurgitation is not visualized. Aorta: The aortic root is normal in size and structure. There is mild (Grade II) plaque involving the descending aorta. IAS/Shunts: There is left bowing of the interatrial septum, suggestive of elevated right atrial pressure. No atrial level shunt detected by color flow Doppler. Additional Comments: No vegetations. A device lead is visualized.  TRICUSPID VALVE TR Peak grad:   41.2 mmHg TR Vmax:        321.00 cm/s Kirk Ruths MD Electronically signed by Kirk Ruths MD Signature Date/Time: 06/02/2021/1:30:33 PM    Final    CUP PACEART REMOTE DEVICE CHECK  Result Date:  05/14/2021 Scheduled remote reviewed. Normal device function.  Next remote 91 days. Kathy Breach, RN, CCDS, [employer]   Microbiology: Recent Results (from the past 240 hour(s))  Urine Culture     Status: Abnormal   Collection Time: 05/28/21  1:17 PM   Specimen: Urine, Clean Catch  Result Value Ref Range Status   Specimen Description   Final    URINE, CLEAN CATCH Performed at [facility], [address], [place], [place] [address]    Special Requests   Final    NONE Performed at [facility], [address], [place], [place] [address]    Culture (A)  Final    <10,000 COLONIES/mL INSIGNIFICANT GROWTH Performed at [facility] [address], [place], [place] [address]    Report Status 05/30/2021 FINAL  Final  Blood Culture (routine x 2)     Status: None   Collection Time: 05/28/21  1:39 PM   Specimen: Right Antecubital; Blood  Result Value Ref Range Status   Specimen Description   Final    RIGHT ANTECUBITAL BOTTLES DRAWN AEROBIC AND ANAEROBIC   Special Requests Blood Culture adequate volume  Final   Culture   Final    NO GROWTH 5 DAYS Performed at [facility], [place]  [address], [place], [place] [address]    Report Status 06/02/2021 FINAL  Final  Blood Culture (routine x 2)     Status: None   Collection Time: 05/28/21  1:39 PM   Specimen: Left Antecubital; Blood  Result Value Ref Range Status   Specimen Description   Final    LEFT ANTECUBITAL BOTTLES DRAWN AEROBIC AND ANAEROBIC   Special Requests Blood Culture adequate volume  Final   Culture   Final    NO GROWTH 5 DAYS Performed at [facility], [address] [place], [place] [address]    Report Status 06/02/2021 FINAL  Final  Resp Panel by RT-PCR (Flu A&B, Covid) Nasopharyngeal Swab     Status: None   Collection Time: 05/28/21  1:49 PM   Specimen: Nasopharyngeal Swab; Nasopharyngeal(NP) swabs in vial transport medium  Result Value Ref Range Status   SARS Coronavirus 2 by RT PCR NEGATIVE NEGATIVE Final     Comment: (NOTE) SARS-CoV-2 target nucleic acids are NOT DETECTED.  The SARS-CoV-2 RNA is generally detectable in upper respiratory specimens during the acute phase of infection. The lowest concentration of SARS-CoV-2 viral copies this assay can detect is 138 copies/mL. A negative result does not preclude SARS-Cov-2 infection and should not be used as the sole basis for treatment or other patient management decisions. A negative result may occur with  improper specimen collection/handling, submission of specimen other than nasopharyngeal swab, presence of viral mutation(s) within the areas targeted by this assay, and inadequate number of viral copies(<138 copies/mL). A negative result must be combined with clinical observations, patient history, and epidemiological information. The expected result is Negative.  Fact Sheet for Patients:  EntrepreneurPulse.com.au  Fact Sheet for Healthcare Providers:  IncredibleEmployment.be  This test is no t yet approved or cleared by the [place] FDA and  has been authorized for detection and/or diagnosis of SARS-CoV-2 by FDA under an Emergency Use Authorization (EUA). This EUA will remain  in effect (meaning this test can be used) for the duration of the COVID-19 declaration under Section 564(b)(1) of the Act, 21 U.S.C.section 360bbb-3(b)(1), unless the authorization is terminated  or revoked sooner.       Influenza A by PCR NEGATIVE NEGATIVE Final   Influenza B by PCR NEGATIVE NEGATIVE Final    Comment: (NOTE) The Xpert Xpress SARS-CoV-2/FLU/RSV plus assay is intended as an aid in the diagnosis of influenza from Nasopharyngeal swab specimens and should not be used as a sole basis for treatment. Nasal washings and aspirates are unacceptable for Xpert Xpress SARS-CoV-2/FLU/RSV testing.  Fact Sheet for Patients: EntrepreneurPulse.com.au  Fact Sheet for Healthcare  Providers: IncredibleEmployment.be  This test is not yet approved or cleared by the [place] FDA and has been authorized for detection and/or diagnosis of SARS-CoV-2 by FDA under an Emergency Use Authorization (EUA). This EUA will remain in effect (meaning this test can be used) for the duration of the COVID-19 declaration under Section 564(b)(1) of the Act, 21 U.S.C. section 360bbb-3(b)(1), unless the authorization is terminated or revoked.  Performed at [facility], [address], [place],  [address]      Labs: Basic Metabolic Panel: Recent Labs  Lab 05/29/21 0201 05/31/21 1010 06/01/21 0616 06/03/21 0507 06/03/21 1359 06/03/21 1400 06/04/21 0345  NA 134* 135  --  135 141 141 135  K 4.1 4.7  --  4.1 3.8 3.9 4.0  CL 106 106  --  107  --   --  105  CO2 21* 22  --  21*  --   --  21*  GLUCOSE 116* 97  --  94  --   --  94  BUN 20 14  --  15  --   --  17  CREATININE 1.22 1.12 1.13 1.40*  --   --  1.27*  CALCIUM 9.3 10.0  --  9.6  --   --  9.6   Liver Function Tests: Recent Labs  Lab 05/31/21 1010  AST 15  ALT 11  ALKPHOS 43  BILITOT 0.6  PROT 7.3  ALBUMIN 3.1*   No results for input(s): LIPASE, AMYLASE in the last 168 hours. No results for input(s): AMMONIA in the last 168 hours. CBC: Recent Labs  Lab 05/31/21 0118 06/01/21 0616 06/02/21 0131 06/03/21 0507 06/03/21 1359 06/03/21 1400 06/04/21 0345  WBC 3.8* 4.4 5.0 4.2  --   --  5.3  HGB 12.5* 12.8* 12.8* 12.1* 11.9* 11.9* 11.8*  HCT 37.8* 37.8* 38.9* 35.9* 35.0* 35.0* 36.2*  MCV 89.4 88.7 89.6 89.3  --   --  90.7  PLT 179 176 158 141*  --   --  143*   Cardiac Enzymes: No results for input(s): CKTOTAL, CKMB, CKMBINDEX, TROPONINI in the last 168 hours. BNP: BNP (last 3 results) No results for input(s): BNP in the last 8760 hours.  ProBNP (last 3 results) No results for input(s): PROBNP in the last 8760 hours.  CBG: No results for input(s): GLUCAP in the last 168  hours.     Signed:  Florencia Reasons MD, PhD, FACP  [facility] 06/04/2021, 4:57 PM

## 2021-06-04 NOTE — TOC Initial Note (Addendum)
Transition of Care [facility]) - Initial/Assessment Note    Patient Details  Name: [name] [name] MRN: [id number] Date of Birth: March 13, 1956  Transition of Care [facility]) CM/SW Contact:    [name] Rasher, RN Phone Number:[phone] 06/04/2021, 1:26 PM  Clinical Narrative:                 TOC CM spoke to pt at bedside. States he lives in home with brother and able to afford copay for his meds. He has 3 meds the copay is $9 (Eliquis, Entresto) and other meds are $3. Pt states his brother takes him to his appt but wanted information to see if his insurance covers transportation. Will send for benefits check. Has necessary DME. States he does weigh himself and takes meds as prescribed. Explained importance of daily weights. CSW consult for food stamps.   Expected Discharge Plan: Home/Self Care Barriers to Discharge: Continued Medical Work up   Patient Goals and CMS Choice Patient states their goals for this hospitalization and ongoing recovery are:: continue to be indepedent at home      Expected Discharge Plan and Services Expected Discharge Plan: Home/Self Care In-house Referral: Clinical Social Work Discharge Planning Services: CM Consult   Living arrangements for the past 2 months: [place]                                      Prior Living Arrangements/Services Living arrangements for the past 2 months: Single Family Home Lives with:: Siblings (brother) Patient language and need for interpreter reviewed:: Yes Do you feel safe going back to the place where you live?: Yes      Need for Family Participation in Patient Care: No (Comment) Care giver support system in place?: No (comment) Current home services: DME (scale, bedside commode, blood pressure cuff, pulse ox, Rolling walker) Criminal Activity/Legal Involvement Pertinent to Current Situation/Hospitalization: No - Comment as needed  Activities of Daily Living Home Assistive Devices/Equipment: None ADL  Screening (condition at time of admission) Patient's cognitive ability adequate to safely complete daily activities?: No Is the patient deaf or have difficulty hearing?: No Does the patient have difficulty seeing, even when wearing glasses/contacts?: No Does the patient have difficulty concentrating, remembering, or making decisions?: No Patient able to express need for assistance with ADLs?: Yes Does the patient have difficulty dressing or bathing?: No Independently performs ADLs?: Yes (appropriate for developmental age) Does the patient have difficulty walking or climbing stairs?: No Weakness of Legs: None Weakness of Arms/Hands: None  Permission Sought/Granted Permission sought to share information with : Case Manager, PCP, Family Supports Permission granted to share information with : Yes, Verbal Permission Granted  Share Information with NAME: [name] [name]  Permission granted to share info w AGENCY: PCP  Permission granted to share info w Relationship: brother  Permission granted to share info w Contact Information: [phone]  Emotional Assessment Appearance:: Appears stated age Attitude/Demeanor/Rapport: Engaged Affect (typically observed): Pleasant Orientation: : Oriented to Self, Oriented to Place, Oriented to  Time, Oriented to Situation   Psych Involvement: No (comment)  Admission diagnosis:  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] Septic embolism ([place]) [I76] Endocarditis, unspecified chronicity, unspecified endocarditis type [I38] Patient Active Problem List   Diagnosis Date Noted   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   Chronic anticoagulation 05/28/2021   Pulmonary embolism ([place]) 05/28/2021   Hyponatremia 05/13/2017   Gas pain 05/13/2017   CKD (chronic  kidney disease) stage 2, GFR 60-89 ml/min 09/14/2012   Cardiomyopathy, nonischemic (HCC)    Essential hypertension, benign    Implantable cardioverter-defibrillator (ICD) in situ 11/10/2009   Atrial fibrillation ([place])  26/83/4196   Chronic systolic heart failure ([place]) 07/29/2009   PCP:  [name] Chroman, MD Pharmacy:   [id number] [place] [place] [address] [place] [address] Phone: [phone] Fax: [phone]     Social Determinants of Health (SDOH) Interventions    Readmission Risk Interventions No flowsheet data found.

## 2021-06-04 NOTE — TOC CM/SW Note (Addendum)
Checked coverage for transportation with insurance coverage, updated pt that his plan does not cover transportation. Brother will provided transportation at [employer]. Jonnie Finner RN CCM, [facility] ED TOC CM [phone]    Frederica Kuster, Tresa Moore, RN  #  1.   S/W  TEA  @   [employer]  [employer]  #  [phone]     PT'S HAS NO BENEFITS FOR TRANSPORTATION

## 2021-06-04 NOTE — Progress Notes (Addendum)
Patient back from VQ scan, states he was given dye through IV, no complaining that upper lip is feeling swollen and puffy.  Dr. Erlinda Hong paged via [facility], will await call back.    Follow up:  Dr. Erlinda Hong, on department, going to assess patient now.

## 2021-06-04 NOTE — Plan of Care (Signed)

## 2021-06-05 ENCOUNTER — Telehealth (HOSPITAL_COMMUNITY): Payer: Self-pay | Admitting: Licensed Clinical Social Worker

## 2021-06-05 NOTE — Telephone Encounter (Signed)
CSW contacted patient to inform of enrollment in the [facility] Transportation program. Patient provided contact information to obtain ride and set up for appointment in the [facility] on 06-15-21. CSW continues to be available  as needed. Raquel Sarna, [place], [place]

## 2021-06-08 ENCOUNTER — Telehealth: Payer: Self-pay

## 2021-06-08 ENCOUNTER — Telehealth: Payer: Self-pay | Admitting: Internal Medicine

## 2021-06-08 NOTE — Telephone Encounter (Signed)
   [name] [name] DOB: [birth year] MRN: [id number]   RIDER WAIVER AND RELEASE OF LIABILITY  For purposes of improving physical access to our facilities, [facility] is pleased to partner with third parties to provide [facility] patients or other authorized individuals the option of convenient, on-demand ground transportation services (the Technical brewer") through use of the technology service that enables users to request on-demand ground transportation from independent third-party providers.  By opting to use and accept these [employer], I, the undersigned, hereby agree on behalf of myself, and on behalf of any minor child using the Government social research officer for whom I am the parent or legal guardian, as follows:  Government social research officer provided to me are provided by independent third-party transportation providers who are not [employer] or employees and who are unaffiliated with [employer]. [facility] is neither a transportation carrier nor a common or public carrier. [facility] has no control over the quality or safety of the transportation that occurs as a result of the [employer]. [facility] cannot guarantee that any third-party transportation provider will complete any arranged transportation service. [facility] makes no representation, warranty, or guarantee regarding the reliability, timeliness, quality, safety, suitability, or availability of any of the Transport Services or that they will be error free. I fully understand that traveling by vehicle involves risks and dangers of serious bodily injury, including permanent disability, paralysis, and death. I agree, on behalf of myself and on behalf of any minor child using the Transport Services for whom I am the parent or legal guardian, that the entire risk arising out of my use of the [employer] remains solely with me, to the maximum extent permitted under applicable law. The [employer] are provided  "as is" and "as available." [facility] disclaims all representations and warranties, express, implied or statutory, not expressly set out in these terms, including the implied warranties of merchantability and fitness for a particular purpose. I hereby waive and release [facility], its agents, employees, officers, directors, representatives, insurers, attorneys, assigns, successors, subsidiaries, and affiliates from any and all past, present, or future claims, demands, liabilities, actions, causes of action, or suits of any kind directly or indirectly arising from acceptance and use of the [employer]. I further waive and release [place] and its affiliates from all present and future liability and responsibility for any injury or death to persons or damages to property caused by or related to the use of the [employer]. I have read this Waiver and Release of Liability, and I understand the terms used in it and their legal significance. This Waiver is freely and voluntarily given with the understanding that my right (as well as the right of any minor child for whom I am the parent or legal guardian using the [employer]) to legal recourse against [place] in connection with the [employer] is knowingly surrendered in return for use of these services.   I attest that I read the consent document to [name], gave Mr. Vandervort the opportunity to ask questions and answered the questions asked (if any). I affirm that [name] then provided consent for he's participation in this program.     [name]

## 2021-06-08 NOTE — Telephone Encounter (Signed)
Patient wants to know if Dr. Jeffie Pollock wants him to still see a liver specialist?  He was in the hospital and had a lot of things checked.  Please advise.  Call back:  [phone]   Thanks, Helene Kelp

## 2021-06-09 NOTE — Telephone Encounter (Signed)
Please advise 

## 2021-06-10 LAB — QUANTIFERON-TB GOLD PLUS: QuantiFERON-TB Gold Plus: NEGATIVE

## 2021-06-10 LAB — QUANTIFERON-TB GOLD PLUS (RQFGPL)
QuantiFERON Mitogen Value: 3.53 IU/mL
QuantiFERON Nil Value: 0.02 IU/mL
QuantiFERON TB1 Ag Value: 0.02 IU/mL
QuantiFERON TB2 Ag Value: 0 IU/mL

## 2021-06-10 NOTE — Telephone Encounter (Signed)
Left message to return call to office.

## 2021-06-15 ENCOUNTER — Other Ambulatory Visit (HOSPITAL_COMMUNITY): Payer: Self-pay

## 2021-06-15 ENCOUNTER — Other Ambulatory Visit: Payer: Self-pay

## 2021-06-15 ENCOUNTER — Encounter (HOSPITAL_COMMUNITY): Payer: Self-pay | Admitting: Cardiology

## 2021-06-15 ENCOUNTER — Ambulatory Visit (HOSPITAL_COMMUNITY)
Admit: 2021-06-15 | Discharge: 2021-06-15 | Disposition: A | Discharge status: Home / Self Care | Payer: Medicare HMO | Attending: Cardiology | Admitting: Cardiology

## 2021-06-15 VITALS — BP 90/52 | HR 62 | Wt 227.6 lb

## 2021-06-15 DIAGNOSIS — Z8249 Family history of ischemic heart disease and other diseases of the circulatory system: Secondary | ICD-10-CM | POA: Insufficient documentation

## 2021-06-15 DIAGNOSIS — Z7901 Long term (current) use of anticoagulants: Secondary | ICD-10-CM | POA: Insufficient documentation

## 2021-06-15 DIAGNOSIS — Z7984 Long term (current) use of oral hypoglycemic drugs: Secondary | ICD-10-CM | POA: Insufficient documentation

## 2021-06-15 DIAGNOSIS — I5022 Chronic systolic (congestive) heart failure: Secondary | ICD-10-CM | POA: Diagnosis present

## 2021-06-15 DIAGNOSIS — Z86711 Personal history of pulmonary embolism: Secondary | ICD-10-CM | POA: Insufficient documentation

## 2021-06-15 DIAGNOSIS — I4821 Permanent atrial fibrillation: Secondary | ICD-10-CM | POA: Diagnosis not present

## 2021-06-15 DIAGNOSIS — Z79899 Other long term (current) drug therapy: Secondary | ICD-10-CM | POA: Diagnosis not present

## 2021-06-15 DIAGNOSIS — I110 Hypertensive heart disease with heart failure: Secondary | ICD-10-CM | POA: Insufficient documentation

## 2021-06-15 DIAGNOSIS — Z8674 Personal history of sudden cardiac arrest: Secondary | ICD-10-CM | POA: Diagnosis not present

## 2021-06-15 DIAGNOSIS — I428 Other cardiomyopathies: Secondary | ICD-10-CM | POA: Insufficient documentation

## 2021-06-15 DIAGNOSIS — I281 Aneurysm of pulmonary artery: Secondary | ICD-10-CM | POA: Diagnosis not present

## 2021-06-15 DIAGNOSIS — I272 Pulmonary hypertension, unspecified: Secondary | ICD-10-CM | POA: Insufficient documentation

## 2021-06-15 DIAGNOSIS — Z87891 Personal history of nicotine dependence: Secondary | ICD-10-CM | POA: Insufficient documentation

## 2021-06-15 LAB — BASIC METABOLIC PANEL
Anion gap: 9 (ref 5–15)
BUN: 36 mg/dL — ABNORMAL HIGH (ref 8–23)
CO2: 24 mmol/L (ref 22–32)
Calcium: 10.5 mg/dL — ABNORMAL HIGH (ref 8.9–10.3)
Chloride: 102 mmol/L (ref 98–111)
Creatinine, Ser: 1.58 mg/dL — ABNORMAL HIGH (ref 0.61–1.24)
GFR, Estimated: 49 mL/min — ABNORMAL LOW (ref >60)
Glucose, Bld: 106 mg/dL — ABNORMAL HIGH (ref 70–99)
Potassium: 4.8 mmol/L (ref 3.5–5.1)
Sodium: 135 mmol/L (ref 135–145)

## 2021-06-15 LAB — BRAIN NATRIURETIC PEPTIDE: B Natriuretic Peptide: 75.3 pg/mL (ref 0.0–100.0)

## 2021-06-15 NOTE — Progress Notes (Signed)
PCP: Glenda Chroman, MD Cardiology: Dr. Domenic Polite HF Cardiology: Dr. Aundra Dubin  [name] is a 65 y.o. with a history of anemia, permanent atrial fibrillation, cardiac arrest 1999, [employer]  ICD, NICM, HTN.  He has a long history of cardiomyopathy.     In 7/22, he had CT chest that showed multiple aneurysms in his pulmonary arterial bed. PCCM consulted and felt like this could be caused by pulmonary septic emboli. He was also noted to have small segmental PEs.  Started on heparin drip and vanc/cefepime/metronidazone. Lower extremity dopplers negative for DVT. Transferred to [name] for further management. TEE showed no vegetation.  Echo this admission showed EF 35-40%, D-shaped interventricular septum, moderate RV enlargement, mild RV dysfunction, moderate pericardial effusion, severe TR, PASP 74 mmHg.  RHC/LHC showed normal coronaries, moderate PAH.  Patient was diuresed.    He returns today for followup of CHF.  He has been doing well at home.  No dyspnea walking on flat ground.  He is short of breath with stairs.  No chest pain.  No palpitations.  SBP running 100s at home.  No lightheadedness.   Labs (7/22): K 4, creatinine 1.27  ECG (personally reviewed, 7/22): atrial fibrillation, narrow QRS  PMH:  1.  Permanent atrial fibrillation.  2.  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   - TEE showed no endocarditis, not mycotic aneurysms.  3.  Pulmonary emboli: CT chest in 7/22 showed small segmental PEs.  - V/Q scan (7/22): No acute PE, ?chronic PEs.  4.  Chronic systolic CHF: Nonischemic cardiomyopathy.  Prominent RV failure.  Medtronic ICD.  - Echo (7/22) with EF 35-40%, D-shaped interventricular septum, moderate RV enlargement, mild RV dysfunction, moderate pericardial effusion, severe TR, PASP 74 mmHg.  - RHC/LHC (7/22): No CAD. Mean RA 7, PA 60/13 mean 29, mean PCWP 8, CI 2.53, PVR 3.5 WU.  5. Pulmonary hypertension: RHC (7/22) with Mean  RA 7, PA 60/13 mean 29, mean PCWP 8, CI 2.53, PVR 3.5 WU. Echo in 7/22 with RV failure.  - V/Q scan with possible segmental chronic PEs.  - ANA, ANCA, HIV negative 7/22.  6. Cardiac arrest 1999: MDT ICD.  7. Pericardial effusion: moderate on last echo.   Social History   Socioeconomic History   Marital status: Single    Spouse name: Not on file   Number of children: Not on file   Years of education: Not on file   Highest education level: Not on file  Occupational History   Occupation: DIABLED  Tobacco Use   Smoking status: Former    Packs/day: 0.80    Years: 6.00    Pack years: 4.80    Types: Cigarettes    Start date: 05/13/1978    Quit date: 11/15/1996    Years since quitting: 24.5   Smokeless tobacco: Never  Vaping Use   Vaping Use: Never used  Substance and Sexual Activity   Alcohol use: No    Alcohol/week: 0.0 standard drinks   Drug use: Not Currently    Types: Marijuana    Comment: Many years ago   Sexual activity: Never  Other Topics Concern   Not on file  Social History Narrative   Lives in [place] with his father and 2 brothers.   Social Determinants of Health   Financial Resource Strain: Low Risk    Difficulty of Paying Living Expenses: Not very hard  Food Insecurity: No Food Insecurity   Worried About  Running Out of Food in the Last Year: Never true   Ran Out of Food in the Last Year: Never true  Transportation Needs: Unmet Transportation Needs   Lack of Transportation (Medical): Yes   Lack of Transportation (Non-Medical): Yes  Physical Activity: Not on file  Stress: Not on file  Social Connections: Not on file  Intimate Partner Violence: Not on file   Family History  Problem Relation Age of Onset   Diabetes Mother        Died @ 80.   Hypertension Father        Alive @ 58.   Alzheimer's disease Father    Diabetes Brother        Brother also has htn   Hypertension Brother    ROS: All systems reviewed and negative except as per HPI.    Current Outpatient Medications  Medication Sig Dispense Refill   carvedilol (COREG) 3.125 MG tablet Take 1 tablet (3.125 mg total) by mouth 2 (two) times daily with a meal. 60 tablet 0   Cholecalciferol (VITAMIN D3) 2000 UNITS capsule Take 2,000 Units by mouth daily.     dapagliflozin propanediol (FARXIGA) 10 MG TABS tablet Take 1 tablet (10 mg total) by mouth daily before breakfast. 30 tablet 6   ELIQUIS 5 MG TABS tablet TAKE 1 TABLET BY MOUTH TWICE A DAY 60 tablet 6   finasteride (PROSCAR) 5 MG tablet TAKE 1 TABLET BY MOUTH EVERY DAY 90 tablet 3   furosemide (LASIX) 40 MG tablet Take 1 tablet (40 mg total) by mouth daily. 30 tablet 0   polyethylene glycol powder (GLYCOLAX/MIRALAX) powder Take 17 g by mouth daily as needed. For constipation - mix with 8 oz liquid and drink     potassium chloride SA (KLOR-CON) 20 MEQ tablet Take 1 tablet (20 mEq total) by mouth daily. 30 tablet 0   pravastatin (PRAVACHOL) 40 MG tablet Take 40 mg by mouth daily.     sacubitril-valsartan (ENTRESTO) 24-26 MG Take 1 tablet by mouth 2 (two) times daily. 60 tablet 0   spironolactone (ALDACTONE) 25 MG tablet TAKE 1/2 TABLET BY MOUTH EVERY DAY 45 tablet 3   No current facility-administered medications for this encounter.   BP (!) 90/52   Pulse 62   Wt 103.2 kg (227 lb 9.6 oz)   SpO2 97%   BMI 27.70 kg/m  General: NAD Neck: No JVD, no thyromegaly or thyroid nodule.  Lungs: Clear to auscultation bilaterally with normal respiratory effort. CV: Nondisplaced PMI.  Heart regular S1/S2, no S3/S4, no murmur.  No peripheral edema.  No carotid bruit.  Normal pedal pulses.  Abdomen: Soft, nontender, no hepatosplenomegaly, no distention.  Skin: Intact without lesions or rashes.  Neurologic: Alert and oriented x 3.  Psych: Normal affect. Extremities: No clubbing or cyanosis.  HEENT: Normal.   Assessment/Plan: 1. Acute on chronic systolic CHF with prominent RV failure: Cardiomyopathy x years with MDT ICD since 1999  cardiac arrest. No significant CAD on LHC in 7/22.  Nonischemic cardiomyopathy.  Echo in 7/22 showed EF 35-40%, D-shaped IV septum with mildly decreased RV systolic function and moderate RVE, moderate pericardial effusion, severe TR, dilated IVC, PASP 74 mmHg. Seems to have predominant RV failure.   - Suspect that his ICD is not MRI compatible (old model) so probably will not be able to get cardiac MRI. - Continue Lasix 40 mg daily, BMET/BNP today.  - Continue Entresto 24/26 bid.  - Continue spironolactone 12.5 mg daily.  - Continue Coreg  3.125 mg bid.  - Continue dapagliflozin 10 mg daily.  2. Pulmonary hypertension: Moderate PAH on 7/22 RHC, RV failure with severe TR noted on 7/22 echo.  Cause of PH + RV failure uncertain.  Patient has segmental PA aneurysms (see below).  ?Chronic PEs => CTA looked like possible small segmental PEs. V/Q scan did not show acute PEs, possible small chronic PEs.  ANA, ANCA, HIV negative.  - Would start treatment with riociguat 0.5 mg tid for possible CTEPH given PAH and segmental PEs.  I do not think that the small PEs would be amenable to thromboendarterectomy.  3. Multiple segmental pulmonary artery aneurysms: Noted on CT chest in 7/22.  Cause is uncertain.  Initially thought to be mycotic aneurysms, but TEE showed no endocarditis and blood cultures negative.  CTA reviewed with Dr. Carlis Abbott (pulmonary), he may have small PEs but this does not appear to explain the pulmonary aneurysms.  ?Inflammatory disease such as Behcet's syndrome with elevated ESR and CRP.  ANCA and ANA negative.  He does not have a prominent history of mucocutaneous ulcers or other findings suggestive of Behcets.   - I am still not sure of the etiology, will have him followup with pulmonary.  4. Atrial fibrillation:  Chronic/permanent.  - Continue apixaban.  - Reasonable rate control on low dose Coreg.  5. PE: Possible small segmental PEs on CT chest 7/22. . - Continue apixaban.   Followup in 1  month with APP.   Loralie Champagne 06/15/2021

## 2021-06-15 NOTE — Patient Instructions (Addendum)
Labs done today. We will contact you only if your labs are abnormal.  We will be in contact with you regarding starting Adempas.  No other medication changes were made. Please continue all current medications as prescribed.  You have been referred to Pulmonology. They will contact you to schedule an appointment.    Your physician recommends that you schedule a follow-up appointment in: 1 month with our [facility] here in our office.   If you have any questions or concerns before your next appointment please send [place] a message through [place] or call our office at [phone].    TO LEAVE A MESSAGE FOR THE NURSE SELECT OPTION 2, PLEASE LEAVE A MESSAGE INCLUDING: YOUR NAME DATE OF BIRTH CALL BACK NUMBER REASON FOR CALL**this is important as we prioritize the call backs  YOU WILL RECEIVE A CALL BACK THE SAME DAY AS LONG AS YOU CALL BEFORE 4:00 PM   Do the following things EVERYDAY: Weigh yourself in the morning before breakfast. Write it down and keep it in a log. Take your medicines as prescribed Eat low salt foods--Limit salt (sodium) to 2000 mg per day.  Stay as active as you can everyday Limit all fluids for the day to less than 2 liters   At the [facility], you and your health needs are our priority. As part of our continuing mission to provide you with exceptional heart care, we have created designated Provider Care Teams. These Care Teams include your primary Cardiologist (physician) and Advanced Practice Providers (APPs- Physician Assistants and Nurse Practitioners) who all work together to provide you with the care you need, when you need it.   You may see any of the following providers on your designated Care Team at your next follow up: Dr Glori Bickers Dr Haynes Kerns, NP Lyda Jester, [place] Audry Riles, PharmD   Please be sure to bring in all your medications bottles to every appointment.

## 2021-06-16 ENCOUNTER — Other Ambulatory Visit (HOSPITAL_COMMUNITY): Payer: Self-pay

## 2021-06-16 ENCOUNTER — Telehealth (HOSPITAL_COMMUNITY): Payer: Self-pay | Admitting: Pharmacist

## 2021-06-16 NOTE — Telephone Encounter (Signed)
Patient Advocate Encounter   Received notification from [facility] that prior authorization for Adempas is required.   PA submitted on CoverMyMeds Key [id number] Status is pending   Will continue to follow.    Audry Riles, PharmD, BCPS, BCCP, CPP Heart Failure Clinic Pharmacist [phone]

## 2021-06-17 ENCOUNTER — Other Ambulatory Visit: Payer: Self-pay

## 2021-06-17 NOTE — Telephone Encounter (Signed)
Sent in [place] application to the Adempas Patient Support Program.   Application pending, will continue to follow.    Audry Riles, PharmD, BCPS, BCCP, CPP Heart Failure Clinic Pharmacist [phone]

## 2021-06-17 NOTE — Telephone Encounter (Signed)
Advanced Heart Failure Patient Advocate Encounter  Prior Authorization for Adempas has been approved.    Effective dates: 11/15/09 through 11/14/21  Audry Riles, PharmD, BCPS, BCCP, CPP Heart Failure Clinic Pharmacist [phone]

## 2021-06-22 NOTE — Telephone Encounter (Signed)
Patient called and notified. Patient voiced understanding.

## 2021-06-25 ENCOUNTER — Other Ambulatory Visit (HOSPITAL_COMMUNITY): Payer: Self-pay

## 2021-06-25 DIAGNOSIS — I5022 Chronic systolic (congestive) heart failure: Secondary | ICD-10-CM

## 2021-06-25 NOTE — Progress Notes (Signed)
Orders Placed This Encounter  Procedures   Basic metabolic panel    Standing Status:   Future    Standing Expiration Date:   06/25/2022    Order Specific Question:   Release to patient    Answer:   Immediate

## 2021-06-25 NOTE — Progress Notes (Signed)
Orders Placed This Encounter  Procedures   Basic metabolic panel    Standing Status:   Future    Number of Occurrences:   1    Standing Expiration Date:   06/25/2022    Order Specific Question:   Release to patient    Answer:   Immediate

## 2021-06-25 NOTE — Telephone Encounter (Signed)
Received message that patient's enrollment in Adempas patient support program was accepted. Adempas was shipped to the patient 06/24/21. He will receive the medication from [place]. Added Adempas to patient's medication list.    Audry Riles, PharmD, BCPS, BCCP, CPP Heart Failure Clinic Pharmacist [phone]

## 2021-06-27 LAB — BASIC METABOLIC PANEL
BUN/Creatinine Ratio: 18 (ref 10–24)
BUN: 25 mg/dL (ref 8–27)
CO2: 20 mmol/L (ref 20–29)
Calcium: 10 mg/dL (ref 8.6–10.2)
Chloride: 102 mmol/L (ref 96–106)
Creatinine, Ser: 1.37 mg/dL — ABNORMAL HIGH (ref 0.76–1.27)
Glucose: 81 mg/dL (ref 65–99)
Potassium: 4.8 mmol/L (ref 3.5–5.2)
Sodium: 137 mmol/L (ref 134–144)
eGFR: 58 mL/min/{1.73_m2} — ABNORMAL LOW (ref >59)

## 2021-07-04 ENCOUNTER — Other Ambulatory Visit: Payer: Self-pay | Admitting: Cardiology

## 2021-07-09 ENCOUNTER — Other Ambulatory Visit: Payer: Self-pay

## 2021-07-09 ENCOUNTER — Encounter: Payer: Self-pay | Admitting: Urology

## 2021-07-09 ENCOUNTER — Ambulatory Visit: Payer: Medicare HMO | Admitting: Urology

## 2021-07-09 VITALS — BP 114/76 | HR 86

## 2021-07-09 DIAGNOSIS — R3129 Other microscopic hematuria: Secondary | ICD-10-CM

## 2021-07-09 DIAGNOSIS — R351 Nocturia: Secondary | ICD-10-CM | POA: Diagnosis not present

## 2021-07-09 DIAGNOSIS — R3915 Urgency of urination: Secondary | ICD-10-CM

## 2021-07-09 DIAGNOSIS — N138 Other obstructive and reflux uropathy: Secondary | ICD-10-CM

## 2021-07-09 DIAGNOSIS — R972 Elevated prostate specific antigen [PSA]: Secondary | ICD-10-CM

## 2021-07-09 DIAGNOSIS — N401 Enlarged prostate with lower urinary tract symptoms: Secondary | ICD-10-CM | POA: Diagnosis not present

## 2021-07-09 LAB — URINALYSIS, ROUTINE W REFLEX MICROSCOPIC
Bilirubin, UA: NEGATIVE
Ketones, UA: NEGATIVE
Leukocytes,UA: NEGATIVE
Nitrite, UA: NEGATIVE
Protein,UA: NEGATIVE
Specific Gravity, UA: 1.015 (ref 1.005–1.030)
Urobilinogen, Ur: 0.2 mg/dL (ref 0.2–1.0)
pH, UA: 6.5 (ref 5.0–7.5)

## 2021-07-09 LAB — MICROSCOPIC EXAMINATION
Epithelial Cells (non renal): NONE SEEN /hpf (ref 0–10)
Renal Epithel, UA: NONE SEEN /hpf
WBC, UA: NONE SEEN /hpf (ref 0–5)

## 2021-07-09 MED ORDER — CIPROFLOXACIN HCL 500 MG PO TABS
500.0000 mg | ORAL_TABLET | Freq: Once | ORAL | Status: AC
  Administered 2021-07-09: 500 mg via ORAL

## 2021-07-09 NOTE — Progress Notes (Signed)
Subjective:  1. Microhematuria   2. BPH with urinary obstruction   3. Urgency of urination   4. Nocturia   5. Elevated PSA      07/09/21: [name] [name] returns today in f/u.   He had a PSA done on 03/12/21 and it was 6.2 with a 21% f/t ratio.  His is down from his level last year.  He remains on finasteride.   He had a CT hematuria study done that showed several non-GU findings including possible appendicitis for which he saw Dr. Arnoldo Morale and was not felt to require surgery and pulmonary lesions for which he was hospitalized and given antibiotics for possible septic emboli but an echo showed no vegetations.  He did have an enlarged prostate as well.  HIs UA has 3-10 RBC's today.    4/28/2: He returns today in f/u.  He remains on finasteride. He didn't get the PSA done.  He is voiding well with an IPSS of 5 with some urgency.     GU Hx; [name] [name] is a former patient of Dr. Exie Parody with a history of and elevated PSA and BPH with BOO. He is off of  tamsulosin but remains on finasteride. His PSA was 7.3 last year and is 7.2 prior to this visit.  It was  up to 8 in 2019  6.4.  He has had 3 negative biopsies in 2012, 2014 and 2016. He had cystoscopy 4-5 years ago with BPH and BOO with a large prostate. He is voiding well but is on furosemide which contributes to urgency and nocturia.   He has a good stream and empties well. He has no hematuria or dysuria. His IPSS is 10 today.   He is on warfarin but is having trouble maintaining a therapeutic level.    IPSS     Row Name 07/09/21 1000         International Prostate Symptom Score   How often have you had the sensation of not emptying your bladder? Not at All     How often have you had to urinate less than every two hours? Not at All     How often have you found you stopped and started again several times when you urinated? Not at All     How often have you found it difficult to postpone urination? Not at All     How often have you had a weak  urinary stream? Not at All     How often have you had to strain to start urination? Not at All     How many times did you typically get up at night to urinate? 1 Time     Total IPSS Score 1           Quality of Life due to urinary symptoms   If you were to spend the rest of your life with your urinary condition just the way it is now how would you feel about that? Pleased                ROS:  ROS:  A complete review of systems was performed.  All systems are negative except for pertinent findings as noted.   ROS  No Known Allergies  Outpatient Encounter Medications as of 07/09/2021  Medication Sig   carvedilol (COREG) 3.125 MG tablet Take 1 tablet (3.125 mg total) by mouth 2 (two) times daily with a meal.   Cholecalciferol (VITAMIN D3) 2000 UNITS capsule Take 2,000 Units by mouth daily.  dapagliflozin propanediol (FARXIGA) 10 MG TABS tablet Take 1 tablet (10 mg total) by mouth daily before breakfast.   digoxin (LANOXIN) 0.125 MG tablet Take 125 mcg by mouth daily.   ELIQUIS 5 MG TABS tablet TAKE 1 TABLET BY MOUTH TWICE A DAY   finasteride (PROSCAR) 5 MG tablet TAKE 1 TABLET BY MOUTH EVERY DAY   furosemide (LASIX) 40 MG tablet Take 1 tablet (40 mg total) by mouth daily.   polyethylene glycol powder (GLYCOLAX/MIRALAX) powder Take 17 g by mouth daily as needed. For constipation - mix with 8 oz liquid and drink   Potassium Chloride ER 20 MEQ TBCR Take 1 tablet by mouth daily.   potassium chloride SA (KLOR-CON) 20 MEQ tablet Take 1 tablet (20 mEq total) by mouth daily.   pravastatin (PRAVACHOL) 40 MG tablet Take 40 mg by mouth daily.   sacubitril-valsartan (ENTRESTO) 24-26 MG Take 1 tablet by mouth 2 (two) times daily.   spironolactone (ALDACTONE) 25 MG tablet TAKE 1/2 TABLET BY MOUTH EVERY DAY   Riociguat (ADEMPAS) 0.5 MG TABS Take 0.5 mg by mouth 3 (three) times daily. (Patient not taking: Reported on 07/09/2021)   [EXPIRED] ciprofloxacin (CIPRO) tablet 500 mg    No  facility-administered encounter medications on file as of 07/09/2021.    Past Medical History:  Diagnosis Date   Anemia    Atrial fibrillation ([place])    AVM (arteriovenous malformation)    Duodenum - nonbleeding and EGD 11/13   Cardiac arrest ([place]) 1998   ICD implanted at [facility]   Cardiomyopathy, nonischemic ([place])    LVEF [id number]   Chronic systolic heart failure ([place])    Essential hypertension    History of colonic polyps    Colonoscopy 11/13   Pneumonia    Severe with respiratory failure 10/13    Past Surgical History:  Procedure Laterality Date   [place]   COLONOSCOPY  09/26/2012   Procedure: COLONOSCOPY;  Surgeon: Beryle Beams, MD;  Location: [facility];  Service: Endoscopy;  Laterality: N/A;   Defibrillator system revision  04/12/12   MDT Protecta XT VR implanted at [facility] by Dr Deno Etienne with previously implanted system and leads extracted due to RV lead failure   ESOPHAGOGASTRODUODENOSCOPY  09/25/2012   Procedure: ESOPHAGOGASTRODUODENOSCOPY (EGD);  Surgeon: Beryle Beams, MD;  Location: [facility] ENDOSCOPY;  Service: Endoscopy;  Laterality: N/A;   RIGHT/LEFT HEART CATH AND CORONARY ANGIOGRAPHY N/A 06/03/2021   Procedure: RIGHT/LEFT HEART CATH AND CORONARY ANGIOGRAPHY;  Surgeon: Larey Dresser, MD;  Location: [facility] CV LAB;  Service: Cardiovascular;  Laterality: N/A;   TEE WITHOUT CARDIOVERSION N/A 06/02/2021   Procedure: TRANSESOPHAGEAL ECHOCARDIOGRAM (TEE);  Surgeon: Lelon Perla, MD;  Location: [facility] ENDOSCOPY;  Service: Cardiovascular;  Laterality: N/A;    Social History   Socioeconomic History   Marital status: Single    Spouse name: Not on file   Number of children: Not on file   Years of education: Not on file   Highest education level: Not on file  Occupational History   Occupation: DIABLED  Tobacco Use   Smoking status: Former    Packs/day: 0.80    Years: 6.00    Pack years: 4.80    Types: Cigarettes    Start date: 05/13/1978     Quit date: 11/15/1996    Years since quitting: 24.6   Smokeless tobacco: Never  Vaping Use   Vaping Use: Never used  Substance and Sexual Activity   Alcohol use: No  Alcohol/week: 0.0 standard drinks   Drug use: Not Currently    Types: Marijuana    Comment: Many years ago   Sexual activity: Never  Other Topics Concern   Not on file  Social History Narrative   Lives in [place] with his father and 2 brothers.   Social Determinants of Health   Financial Resource Strain: Low Risk    Difficulty of Paying Living Expenses: Not very hard  Food Insecurity: No Food Insecurity   Worried About Charity fundraiser in the Last Year: Never true   Arboriculturist in the Last Year: Never true  Transportation Needs: Unmet Transportation Needs   Lack of Transportation (Medical): Yes   Lack of Transportation (Non-Medical): Yes  Physical Activity: Not on file  Stress: Not on file  Social Connections: Not on file  Intimate Partner Violence: Not on file    Family History  Problem Relation Age of Onset   Diabetes Mother        Died @ 66.   Hypertension Father        Alive @ 61.   Alzheimer's disease Father    Diabetes Brother        Brother also has htn   Hypertension Brother        Objective: Vitals:   07/09/21 1012  BP: 114/76  Pulse: 86     Physical Exam  Lab Results:  Results for orders placed or performed in visit on 07/09/21 (from the past 24 hour(s))  Urinalysis, Routine w reflex microscopic     Status: Abnormal   Collection Time: 07/09/21 10:16 AM  Result Value Ref Range   Specific Gravity, UA 1.015 1.005 - 1.030   pH, UA 6.5 5.0 - 7.5   Color, UA Yellow Yellow   Appearance Ur Clear Clear   Leukocytes,UA Negative Negative   Protein,UA Negative Negative/Trace   Glucose, UA 1+ (A) Negative   Ketones, UA Negative Negative   RBC, UA 1+ (A) Negative   Bilirubin, UA Negative Negative   Urobilinogen, Ur 0.2 0.2 - 1.0 mg/dL   Nitrite, UA Negative Negative    Microscopic Examination See below:    Narrative   Performed at:  [facility] [address] [place], [place]  [id number] Lab Director: Mina Marble MT, Phone:  [phone]  Microscopic Examination     Status: Abnormal   Collection Time: 07/09/21 10:16 AM   Urine  Result Value Ref Range   WBC, UA None seen 0 - 5 /hpf   RBC 3-10 (A) 0 - 2 /hpf   Epithelial Cells (non renal) None seen 0 - 10 /hpf   Renal Epithel, UA None seen None seen /hpf   Mucus, UA Present Not Estab.   Bacteria, UA Few None seen/Few   Narrative   Performed at:  [facility] [address] [place], [place]  [id number] Lab Director: [place], Phone:  [phone]     BMET No results for input(s): NA, K, CL, CO2, GLUCOSE, BUN, CREATININE, CALCIUM in the last 72 hours. PSA Lab Results  Component Value Date   PSA1 6.2 (H) 03/12/2021       Studies/Results: CLINICAL DATA:  microhematuria   EXAM: CT ABDOMEN AND PELVIS WITHOUT AND WITH CONTRAST   TECHNIQUE: Multidetector CT imaging of the abdomen and pelvis was performed following the standard protocol before and following the bolus administration of intravenous contrast.   CONTRAST:  105m OMNIPAQUE IOHEXOL 300 MG/ML SOLN,  193m OMNIPAQUE IOHEXOL 300 MG/ML SOLN   COMPARISON:  Chest radiograph May 13, 2017. Reports from CT abdomen and pelvis July 30, 2013 and chest CT May 08, 2014. However, no imaging available for comparison at time of dictation.   FINDINGS: Lower chest: Numerous bilateral solid pulmonary nodules in the bilateral lung bases for instance there is a solid 2.4 cm pulmonary nodule in the right paramedian lower lobe on image 5/4 and a solid 1.7 cm pulmonary nodule in the left lower lobe on image 20/4. Four-chamber cardiac enlargement. Moderate pericardial effusion. Partially visualized cardiac pacemaker and defibrillator leads. Gynecomastia.   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 Bilobar nonenhancing fluid density lesions in the liver the largest measuring 1.1 cm lesion in the inferior aspect of the right lobe of the liver on image 45/7, most consistent with a hepatic cysts.   Numerous layering cholelithiasis measuring 2-3 mm without evidence of acute cholecystitis. No biliary ductal dilation.   Pancreas: Within normal limits.   Spleen: Splenomegaly measuring 15 cm in maximum craniocaudal dimension. Peripherally calcified fluid density nonenhancing 4.2 cm splenic lesion, likely representing a posttraumatic cyst. Small splenic granuloma. Fluid density 7 mm lesion along the inferior aspect of the spleen on image 43/12.   Adrenals/Urinary Tract: Thickening of the bilateral adrenal glands without discrete nodularity, favor hyperplasia.   No hydronephrosis. Symmetric enhancement excretion of contrast in the bilateral kidneys. No suspicious filling defect visualized within the opacified portions of the collecting system or ureters on delayed imaging. However the proximal aspect of the right ureter is not well opacified limiting evaluation.   Bilateral fluid density nonenhancing renal lesions measuring up to 3.2 cm on the right and 1.3 cm on the left, none of which demonstrate discernible enhancement most consistent with renal cysts. There are other bilateral tiny hypodense renal lesions which are technically too small to accurately characterize but also favored represent cysts. No solid enhancing renal lesions.   Effacement of the urinary bladder by an enlarged prostate, with otherwise unremarkable appearance of the urinary bladder.   Stomach/Bowel: Stomach is grossly unremarkable for degree of distension. Normal positioning of the duodenum/ligament of [place]. No  pathologic dilation of small bowel. Colonic diverticulosis without findings of acute diverticulitis.   Appendix: Location: Right lower quadrant   Diameter: 12 mm   Appendicolith: Not visualized   Mucosal hyper-enhancement: Present   Extraluminal gas: Not visualized   Periappendiceal collection: Not visualized   Vascular/Lymphatic: Aortic atherosclerosis without aneurysmal dilation. No pathologically enlarged abdominal or pelvic lymph nodes.   Reproductive: Prostatomegaly measuring 7.4 x 6.0 x 5.4 cm (volume = 130 cm^3)   Other: Inflammatory stranding along the appendix and right pericolic gutter. No ascites or drainable fluid collections.   Musculoskeletal: No acute osseous abnormality.   IMPRESSION: 1. Acute uncomplicated appendicitis. 2. Numerous bilateral solid pulmonary nodules in the bilateral lung bases, measuring up to 2.4 cm. Although no imaging comparison is available at time of dictation but prior reports indicate previous pulmonary nodules which resolved on follow-up imaging and calcified mediastinal lymph nodes. While metastatic disease is a primary diagnosis of exclusion, the patient's medical history and prior imaging description also makes granulomatous disease such as sarcoid a differential consideration. Further evaluation with dedicated chest CT is suggested. 3. Mottled heterogeneous appearance of the hepatic parenchyma, with homogeneous appearance of the parenchyma and no discrete hepatic lesions  seen on delayed imaging, nonspecific but in the setting of patient's four-chamber cardiomegaly and pericardial effusion findings are most consistent with congestive hepatopathy. 4. No hydronephrosis. No renal, ureteral or bladder calculi. No solid enhancing renal masses. 5. Prostatomegaly with effacement of the urinary bladder. 6. Splenomegaly with probable sequela prior splenic trauma. 7. Cholelithiasis without evidence of acute cholecystitis. 8. Colonic  diverticulosis without findings of acute diverticulitis. 9. Aortic atherosclerosis.  Aortic Atherosclerosis (ICD10-I70.0).   These results will be called to the ordering clinician or representative by the Radiologist Assistant, and communication documented in the PACS or [employer].     Electronically Signed   By: Dahlia Bailiff MD   On: 04/10/2021 11:06  Procedure: Flexible cystoscopy.  He was prepped with betadine and 2% lidocaine jelly and given Cipro '500mg'$  po.  The scope was passed and he was noted to have a normal urethra.  The sphincter was intact.  The prostate is about 4cm with bilobar hyperplasia with obstruction.  There is mild trabeculation without tumors, stones or inflammation.  The UO's are normal.  There were no complications.   Assessment & Plan: BPH with BOO.  He is doing well on finasteride alone.  Elevated PSA.  His PSA is down from prior levels.  He will have that repeated in 6 months   Microhematuria.  W/U negative.   Meds ordered this encounter  Medications   ciprofloxacin (CIPRO) tablet 500 mg     Orders Placed This Encounter  Procedures   Microscopic Examination   Urinalysis, Routine w reflex microscopic      Return in about 6 months (around 01/09/2022) for with psa.   CC: Glenda Chroman, MD      [name] [name] 07/09/2021 Patient ID: [name] [name], male   DOB: Sep 03, 1956, 65 y.o.   MRN: [id number]

## 2021-07-09 NOTE — Progress Notes (Signed)
Urological Symptom Review  Patient is experiencing the following symptoms: none   Review of Systems  Gastrointestinal (upper)  : Negative for upper GI symptoms  Gastrointestinal (lower) : Negative for lower GI symptoms  Constitutional : Night Sweats  Skin: Negative for skin symptoms  Eyes: Negative for eye symptoms  Ear/Nose/Throat : Negative for Ear/Nose/Throat symptoms  Hematologic/Lymphatic: Negative for Hematologic/Lymphatic symptoms  Cardiovascular : Negative for cardiovascular symptoms  Respiratory : Negative for respiratory symptoms  Endocrine: Negative for endocrine symptoms  Musculoskeletal: Negative for musculoskeletal symptoms  Neurological: Negative for neurological symptoms  Psychologic: Negative for psychiatric symptoms

## 2021-07-15 ENCOUNTER — Other Ambulatory Visit (HOSPITAL_COMMUNITY): Payer: Self-pay | Admitting: Cardiology

## 2021-07-15 NOTE — Progress Notes (Signed)
PCP: Glenda Chroman, MD Cardiology: Dr. Domenic Polite HF Cardiology: Dr. Aundra Dubin  [name] is a 65 y.o. with a history of anemia, permanent atrial fibrillation, cardiac arrest 1999, Medtronic  ICD, NICM, HTN.  He has a long history of cardiomyopathy.     In 7/22, he had CT chest that showed multiple aneurysms in his pulmonary arterial bed. PCCM consulted and felt like this could be caused by pulmonary septic emboli. He was also noted to have small segmental PEs.  Started on heparin drip and vanc/cefepime/metronidazone. Lower extremity dopplers negative for DVT. Transferred to [name] for further management. TEE showed no vegetation.  Echo this admission showed EF 35-40%, D-shaped interventricular septum, moderate RV enlargement, mild RV dysfunction, moderate pericardial effusion, severe TR, PASP 74 mmHg.  RHC/LHC showed normal coronaries, moderate PAH.  Patient was diuresed.    Hospital follow up doing well. 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  Today he returns for HF follow up. He does not have dyspnea walking on flat ground. He does not go up stairs. Lives with his 2 brothers. Overall feeling fine. Denies CP, dizziness, edema, or PND/Orthopnea. Appetite ok. No fever or chills. Weight at home 228-230 pounds. Taking all medications. Just received Adempas in mail.  Labs (7/22): K 4, creatinine 1.27 Labs (8/22): K 4.8, creatinine 1.37  ECG (personally reviewed): atrial fibrillation, QRS 98 ms.  Device Interrogation (personally reviewed): OptiVol below threshold but trending up, stable thoracic impedence, daily activity 2 hrs, no VT/VF.  PMH:  1.  Permanent atrial fibrillation.  2.  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   - TEE showed no endocarditis, not mycotic aneurysms.  3.  Pulmonary emboli: CT chest in 7/22 showed small segmental PEs.  - V/Q scan (7/22): No acute PE, ?chronic PEs.  4.  Chronic systolic CHF: Nonischemic  cardiomyopathy.  Prominent RV failure.  Medtronic ICD.  - Echo (7/22) with EF 35-40%, D-shaped interventricular septum, moderate RV enlargement, mild RV dysfunction, moderate pericardial effusion, severe TR, PASP 74 mmHg.  - RHC/LHC (7/22): No CAD. Mean RA 7, PA 60/13 mean 29, mean PCWP 8, CI 2.53, PVR 3.5 WU.  5. Pulmonary hypertension: RHC (7/22) with Mean RA 7, PA 60/13 mean 29, mean PCWP 8, CI 2.53, PVR 3.5 WU. Echo in 7/22 with RV failure.  - V/Q scan with possible segmental chronic PEs.  - ANA, ANCA, HIV negative 7/22.  6. Cardiac arrest 1999: MDT ICD.  7. Pericardial effusion: moderate on last echo.   Social History   Socioeconomic History   Marital status: Single    Spouse name: Not on file   Number of children: Not on file   Years of education: Not on file   Highest education level: Not on file  Occupational History   Occupation: DIABLED  Tobacco Use   Smoking status: Former    Packs/day: 0.80    Years: 6.00    Pack years: 4.80    Types: Cigarettes    Start date: 05/13/1978    Quit date: 11/15/1996    Years since quitting: 24.6   Smokeless tobacco: Never  Vaping Use   Vaping Use: Never used  Substance and Sexual Activity   Alcohol use: No    Alcohol/week: 0.0 standard drinks   Drug use: Not Currently    Types: Marijuana    Comment: Many years ago   Sexual activity: Never  Other Topics Concern   Not on file  Social History  Narrative   Lives in [place] with his father and 2 brothers.   Social Determinants of Health   Financial Resource Strain: Low Risk    Difficulty of Paying Living Expenses: Not very hard  Food Insecurity: No Food Insecurity   Worried About Charity fundraiser in the Last Year: Never true   Arboriculturist in the Last Year: Never true  Transportation Needs: Unmet Transportation Needs   Lack of Transportation (Medical): Yes   Lack of Transportation (Non-Medical): Yes  Physical Activity: Not on file  Stress: Not on file  Social  Connections: Not on file  Intimate Partner Violence: Not on file   Family History  Problem Relation Age of Onset   Diabetes Mother        Died @ 77.   Hypertension Father        Alive @ 10.   Alzheimer's disease Father    Diabetes Brother        Brother also has htn   Hypertension Brother    ROS: All systems reviewed and negative except as per HPI.   Current Outpatient Medications  Medication Sig Dispense Refill   carvedilol (COREG) 3.125 MG tablet Take 1 tablet (3.125 mg total) by mouth 2 (two) times daily with a meal. 60 tablet 0   Cholecalciferol (VITAMIN D3) 2000 UNITS capsule Take 2,000 Units by mouth daily.     dapagliflozin propanediol (FARXIGA) 10 MG TABS tablet Take 1 tablet (10 mg total) by mouth daily before breakfast. 30 tablet 6   ELIQUIS 5 MG TABS tablet TAKE 1 TABLET BY MOUTH TWICE A DAY 60 tablet 6   finasteride (PROSCAR) 5 MG tablet TAKE 1 TABLET BY MOUTH EVERY DAY 90 tablet 3   furosemide (LASIX) 40 MG tablet Take 1 tablet (40 mg total) by mouth daily. 30 tablet 0   polyethylene glycol powder (GLYCOLAX/MIRALAX) powder Take 17 g by mouth daily as needed. For constipation - mix with 8 oz liquid and drink     Potassium Chloride ER 20 MEQ TBCR Take 1 tablet by mouth daily.     potassium chloride SA (KLOR-CON) 20 MEQ tablet Take 1 tablet (20 mEq total) by mouth daily. 30 tablet 0   pravastatin (PRAVACHOL) 40 MG tablet Take 40 mg by mouth daily.     sacubitril-valsartan (ENTRESTO) 24-26 MG Take 1 tablet by mouth 2 (two) times daily. 60 tablet 0   spironolactone (ALDACTONE) 25 MG tablet TAKE 1/2 TABLET BY MOUTH EVERY DAY 45 tablet 3   No current facility-administered medications for this encounter.   Wt Readings from Last 3 Encounters:  07/17/21 104 kg (229 lb 3.2 oz)  06/15/21 103.2 kg (227 lb 9.6 oz)  06/04/21 104.9 kg (231 lb 4.8 oz)   BP 118/69   Pulse 88   Wt 104 kg (229 lb 3.2 oz)   SpO2 98%   BMI 27.90 kg/m   General:  NAD. No resp difficulty HEENT:  Normal Neck: Supple. No JVD. Carotids 2+ bilat; no bruits. No lymphadenopathy or thryomegaly appreciated. Cor: PMI nondisplaced. Irregular rate & rhythm. No rubs, gallops or murmurs. Lungs: Clear Abdomen: Soft, nontender, nondistended. No hepatosplenomegaly. No bruits or masses. Good bowel sounds. Extremities: No cyanosis, clubbing, rash, edema Neuro: Alert & oriented x 3, cranial nerves grossly intact. Moves all 4 extremities w/o difficulty. Affect pleasant.  Assessment/Plan: 1. Chronic systolic CHF with prominent RV failure: Cardiomyopathy x years with MDT ICD since 1999 cardiac arrest. No significant CAD on LHC  in 7/22.  Nonischemic cardiomyopathy.  Echo in 7/22 showed EF 35-40%, D-shaped IV septum with mildly decreased RV systolic function and moderate RVE, moderate pericardial effusion, severe TR, dilated IVC, PASP 74 mmHg. Seems to have predominant RV failure.  He is not overloaded on exam or by OptiVol, NYHA II, although he is not very active. - Suspect that his ICD is not MRI compatible (old model) so probably will not be able to get cardiac MRI. - Increase Entresto to 49/51 mg bid. BMET, BNP today, repeat BMET in 10 days. (He will notify clinic if he becomes dizzy or has low BP with this increase). - Change Lasix to PRN weight gain/edema. - Continue spironolactone 12.5 mg daily.  - Continue Coreg 3.125 mg bid.  - Continue dapagliflozin 10 mg daily.  2. Pulmonary hypertension: Moderate PAH on 7/22 RHC, RV failure with severe TR noted on 7/22 echo.  Cause of PH + RV failure uncertain.  Patient has segmental PA aneurysms (see below).  ?Chronic PEs => CTA looked like possible small segmental PEs. V/Q scan did not show acute PEs, possible small chronic PEs.  ANA, ANCA, HIV negative.  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  Do not think that the small PEs would be amenable to thromboendarterectomy.  3. Multiple segmental  pulmonary artery aneurysms: Noted on CT chest in 7/22.  Cause is uncertain.  Initially thought to be mycotic aneurysms, but TEE showed no endocarditis and blood cultures negative.  CTA reviewed with Dr. Carlis Abbott (pulmonary), he may have small PEs but this does not appear to explain the pulmonary aneurysms.  ?Inflammatory disease such as Behcet's syndrome with elevated ESR and CRP.  ANCA and ANA negative.  He does not have a prominent history of mucocutaneous ulcers or other findings suggestive of Behcets.   - Etiology remains unclear, will have him followup with pulmonary.  4. Atrial fibrillation:  Chronic/permanent.  - Continue apixaban. No abnormal bleeding. - Reasonable rate control on low dose Coreg.  5. PE: Possible small segmental PEs on CT chest 7/22. - Continue apixaban.   Followup in 1 month with APP, consider increasing beta blocker.   [facility] [place] [facility] FNP 07/17/2021

## 2021-07-17 ENCOUNTER — Other Ambulatory Visit: Payer: Self-pay

## 2021-07-17 ENCOUNTER — Encounter (HOSPITAL_COMMUNITY): Payer: Self-pay

## 2021-07-17 ENCOUNTER — Ambulatory Visit (HOSPITAL_COMMUNITY)
Admission: RE | Admit: 2021-07-17 | Discharge: 2021-07-17 | Disposition: A | Discharge status: Home / Self Care | Payer: Medicare HMO | Source: Ambulatory Visit | Attending: Family Medicine | Admitting: Family Medicine

## 2021-07-17 ENCOUNTER — Encounter (HOSPITAL_COMMUNITY): Payer: Medicare HMO

## 2021-07-17 VITALS — BP 118/69 | HR 88 | Wt 229.2 lb

## 2021-07-17 DIAGNOSIS — I493 Ventricular premature depolarization: Secondary | ICD-10-CM | POA: Insufficient documentation

## 2021-07-17 DIAGNOSIS — Z86711 Personal history of pulmonary embolism: Secondary | ICD-10-CM | POA: Insufficient documentation

## 2021-07-17 DIAGNOSIS — Z87891 Personal history of nicotine dependence: Secondary | ICD-10-CM | POA: Insufficient documentation

## 2021-07-17 DIAGNOSIS — Z8249 Family history of ischemic heart disease and other diseases of the circulatory system: Secondary | ICD-10-CM | POA: Insufficient documentation

## 2021-07-17 DIAGNOSIS — Z79899 Other long term (current) drug therapy: Secondary | ICD-10-CM | POA: Insufficient documentation

## 2021-07-17 DIAGNOSIS — Z833 Family history of diabetes mellitus: Secondary | ICD-10-CM | POA: Insufficient documentation

## 2021-07-17 DIAGNOSIS — Z7901 Long term (current) use of anticoagulants: Secondary | ICD-10-CM | POA: Diagnosis not present

## 2021-07-17 DIAGNOSIS — Z7984 Long term (current) use of oral hypoglycemic drugs: Secondary | ICD-10-CM | POA: Insufficient documentation

## 2021-07-17 DIAGNOSIS — I110 Hypertensive heart disease with heart failure: Secondary | ICD-10-CM | POA: Insufficient documentation

## 2021-07-17 DIAGNOSIS — I272 Pulmonary hypertension, unspecified: Secondary | ICD-10-CM | POA: Insufficient documentation

## 2021-07-17 DIAGNOSIS — I5022 Chronic systolic (congestive) heart failure: Secondary | ICD-10-CM | POA: Diagnosis not present

## 2021-07-17 DIAGNOSIS — I4821 Permanent atrial fibrillation: Secondary | ICD-10-CM | POA: Diagnosis not present

## 2021-07-17 DIAGNOSIS — Z95810 Presence of automatic (implantable) cardiac defibrillator: Secondary | ICD-10-CM

## 2021-07-17 DIAGNOSIS — I428 Other cardiomyopathies: Secondary | ICD-10-CM | POA: Insufficient documentation

## 2021-07-17 DIAGNOSIS — I281 Aneurysm of pulmonary artery: Secondary | ICD-10-CM | POA: Insufficient documentation

## 2021-07-17 DIAGNOSIS — Z8674 Personal history of sudden cardiac arrest: Secondary | ICD-10-CM | POA: Insufficient documentation

## 2021-07-17 LAB — BASIC METABOLIC PANEL WITH GFR
Anion gap: 6 (ref 5–15)
BUN: 20 mg/dL (ref 8–23)
CO2: 25 mmol/L (ref 22–32)
Calcium: 9.5 mg/dL (ref 8.9–10.3)
Chloride: 103 mmol/L (ref 98–111)
Creatinine, Ser: 1.21 mg/dL (ref 0.61–1.24)
GFR, Estimated: >60 mL/min
Glucose, Bld: 93 mg/dL (ref 70–99)
Potassium: 4.4 mmol/L (ref 3.5–5.1)
Sodium: 134 mmol/L — ABNORMAL LOW (ref 135–145)

## 2021-07-17 LAB — BRAIN NATRIURETIC PEPTIDE: B Natriuretic Peptide: 179.5 pg/mL — ABNORMAL HIGH (ref 0.0–100.0)

## 2021-07-17 MED ORDER — FUROSEMIDE 40 MG PO TABS: 40.0000 mg | ORAL_TABLET | ORAL | 0 refills | Status: DC | PRN

## 2021-07-17 MED ORDER — ENTRESTO 49-51 MG PO TABS: 1.0000 | ORAL_TABLET | Freq: Two times a day (BID) | ORAL | 6 refills | Status: DC

## 2021-07-17 NOTE — Patient Instructions (Addendum)
INCREASE Entresto to 49/51 mg, one tab twice a day  CHANGE Lasix to as needed for weight gain or swelling ( 3 lbs overnight or 5 lbs in a week)  Labs today We will only contact you if something comes back abnormal or we need to make some changes. Otherwise no news is good news!  Labs needed in 7-10 days  Your physician recommends that you schedule a follow-up appointment in: 1 month  in the Advanced Practitioners (PA/NP) Clinic and in 2-3 months with Dr Aundra Dubin  Do the following things EVERYDAY: Weigh yourself in the morning before breakfast. Write it down and keep it in a log. Take your medicines as prescribed Eat low salt foods--Limit salt (sodium) to 2000 mg per day.  Stay as active as you can everyday Limit all fluids for the day to less than 2 liters  At the [facility], you and your health needs are our priority. As part of our continuing mission to provide you with exceptional heart care, we have created designated Provider Care Teams. These Care Teams include your primary Cardiologist (physician) and Advanced Practice Providers (APPs- Physician Assistants and Nurse Practitioners) who all work together to provide you with the care you need, when you need it.   You may see any of the following providers on your designated Care Team at your next follow up: Dr Glori Bickers Dr Loralie Champagne Dr Patrice Paradise, NP Lyda Jester, [place] Ginnie Smart Audry Riles, PharmD   Please be sure to bring in all your medications bottles to every appointment.

## 2021-07-23 ENCOUNTER — Ambulatory Visit (INDEPENDENT_AMBULATORY_CARE_PROVIDER_SITE_OTHER): Payer: Medicare HMO

## 2021-07-23 DIAGNOSIS — I428 Other cardiomyopathies: Secondary | ICD-10-CM | POA: Diagnosis not present

## 2021-07-23 LAB — ECHOCARDIOGRAM COMPLETE
AR max vel: 3.07 cm2
AV Area VTI: 2.77 cm2
AV Area mean vel: 2.96 cm2
AV Mean grad: 3.2 mmHg
AV Peak grad: 5.7 mmHg
Ao pk vel: 1.19 m/s
Calc EF: 47.7 %
S' Lateral: 3.85 cm
Single Plane A2C EF: 45.7 %
Single Plane A4C EF: 48 %

## 2021-07-27 ENCOUNTER — Other Ambulatory Visit: Payer: Self-pay | Admitting: Cardiology

## 2021-07-28 LAB — BASIC METABOLIC PANEL
BUN/Creatinine Ratio: 18 (ref 10–24)
BUN: 24 mg/dL (ref 8–27)
CO2: 18 mmol/L — ABNORMAL LOW (ref 20–29)
Calcium: 9.5 mg/dL (ref 8.6–10.2)
Chloride: 104 mmol/L (ref 96–106)
Creatinine, Ser: 1.35 mg/dL — ABNORMAL HIGH (ref 0.76–1.27)
Glucose: 92 mg/dL (ref 65–99)
Potassium: 4.5 mmol/L (ref 3.5–5.2)
Sodium: 137 mmol/L (ref 134–144)
eGFR: 59 mL/min/{1.73_m2} — ABNORMAL LOW (ref >59)

## 2021-07-28 LAB — SPECIMEN STATUS REPORT

## 2021-07-29 ENCOUNTER — Telehealth: Payer: Self-pay | Admitting: *Deleted

## 2021-07-29 NOTE — Telephone Encounter (Signed)
-----   Message from Satira Sark, MD sent at 07/23/2021  7:42 PM EDT ----- Results reviewed.  LVEF has improved compared to prior assessment, now 40 to 45% range.  Moderate RV dysfunction and severe pulmonary hypertension.  Also moderate pericardial effusion.  Medication adjustments made at last visit, we will see how he is doing at follow-up.

## 2021-07-29 NOTE — Telephone Encounter (Signed)
Patient informed. Copy sent to PCP °

## 2021-08-07 ENCOUNTER — Encounter (HOSPITAL_COMMUNITY): Payer: Medicare HMO

## 2021-08-13 ENCOUNTER — Ambulatory Visit (INDEPENDENT_AMBULATORY_CARE_PROVIDER_SITE_OTHER): Payer: Medicare HMO

## 2021-08-13 DIAGNOSIS — I428 Other cardiomyopathies: Secondary | ICD-10-CM

## 2021-08-13 LAB — CUP PACEART REMOTE DEVICE CHECK
Battery Voltage: 2.64 V
Brady Statistic RV Percent Paced: 0.5 %
Date Time Interrogation Session: 20220929022826
HighPow Impedance: 304 Ohm
HighPow Impedance: 51 Ohm
Implantable Lead Implant Date: 20130529
Implantable Lead Location: 753860
Implantable Lead Model: 6935
Implantable Pulse Generator Implant Date: 20130529
Lead Channel Impedance Value: 399 Ohm
Lead Channel Pacing Threshold Amplitude: 0.875 V
Lead Channel Pacing Threshold Pulse Width: 0.4 ms
Lead Channel Sensing Intrinsic Amplitude: 1.75 mV
Lead Channel Sensing Intrinsic Amplitude: 1.75 mV
Lead Channel Setting Pacing Amplitude: 2.5 V
Lead Channel Setting Pacing Pulse Width: 0.4 ms
Lead Channel Setting Sensing Sensitivity: 0.3 mV

## 2021-08-18 ENCOUNTER — Encounter (HOSPITAL_COMMUNITY): Payer: Medicare HMO

## 2021-08-20 NOTE — Progress Notes (Signed)
Remote ICD transmission.   

## 2021-08-24 NOTE — Progress Notes (Signed)
Cardiology Office Note  Date: 08/25/2021   ID: [name] [name], DOB 01-Jan-1956, MRN [id number]  PCP:  Glenda Chroman, MD  Cardiologist:  Rozann Lesches, MD Electrophysiologist:  Thompson Grayer, MD   Chief Complaint  Patient presents with   Cardiac follow-up    History of Present Illness: [name] [name] is a 65 y.o. male last seen in June with subsequent follow-up in the heart failure clinic, I reviewed the note from September.  He is here for a routine visit.  Reports no major change.  His weight was up is up today, but wearing layers and a coat, he reports no significant change by his home scales.  No leg swelling.  No orthopnea or PND.  No chest pain or syncope.  He uses as needed Lasix perhaps once a week at most.  He has a Medtronic ICD in place.  He follows with Dr. Rayann Heman.  Last device check showed normal function.  I reviewed his medications.  [name] [name] was increased at his last CHF clinic visit.  He is also on Adempas.  His blood pressure has come down, systolic 98 today, but he does not report any unusual dizziness or obvious symptoms at this time.  I reviewed his most recent lab work in September showing creatinine 1.35 and potassium 4.5.  Past Medical History:  Diagnosis Date   Anemia    Atrial fibrillation ([place])    AVM (arteriovenous malformation)    Duodenum - nonbleeding and EGD 11/13   Cardiac arrest ([place]) 1998   ICD implanted at [facility]   Cardiomyopathy, nonischemic ([place])    LVEF 07-37%   Chronic systolic heart failure ([place])    Essential hypertension    History of colonic polyps    Colonoscopy 11/13   Pneumonia    Severe with respiratory failure 10/13    Past Surgical History:  Procedure Laterality Date   [place]   COLONOSCOPY  09/26/2012   Procedure: COLONOSCOPY;  Surgeon: Beryle Beams, MD;  Location: [facility];  Service: Endoscopy;  Laterality: N/A;   Defibrillator system revision  04/12/12   MDT Protecta XT VR  implanted at [name] by Dr Deno Etienne with previously implanted system and leads extracted due to RV lead failure   ESOPHAGOGASTRODUODENOSCOPY  09/25/2012   Procedure: ESOPHAGOGASTRODUODENOSCOPY (EGD);  Surgeon: Beryle Beams, MD;  Location: [facility] ENDOSCOPY;  Service: Endoscopy;  Laterality: N/A;   RIGHT/LEFT HEART CATH AND CORONARY ANGIOGRAPHY N/A 06/03/2021   Procedure: RIGHT/LEFT HEART CATH AND CORONARY ANGIOGRAPHY;  Surgeon: Larey Dresser, MD;  Location: [facility];  Service: Cardiovascular;  Laterality: N/A;   TEE WITHOUT CARDIOVERSION N/A 06/02/2021   Procedure: TRANSESOPHAGEAL ECHOCARDIOGRAM (TEE);  Surgeon: Lelon Perla, MD;  Location[facility] ENDOSCOPY;  Service: Cardiovascular;  Laterality: N/A;    Current Outpatient Medications  Medication Sig Dispense Refill   ADEMPAS 1 MG TABS Take 1 mg by mouth 3 (three) times daily.     carvedilol (COREG) 3.125 MG tablet Take 1 tablet (3.125 mg total) by mouth 2 (two) times daily with a meal. 60 tablet 0   Cholecalciferol (VITAMIN D3) 2000 UNITS capsule Take 2,000 Units by mouth daily.     dapagliflozin propanediol (FARXIGA) 10 MG TABS tablet Take 1 tablet (10 mg total) by mouth daily before breakfast. 30 tablet 6   ELIQUIS 5 MG TABS tablet TAKE 1 TABLET BY MOUTH TWICE A DAY 60 tablet 6   finasteride (PROSCAR) 5 MG tablet TAKE 1 TABLET  BY MOUTH EVERY DAY 90 tablet 3   furosemide (LASIX) 40 MG tablet Take 1 tablet (40 mg total) by mouth as needed for fluid. 30 tablet 0   polyethylene glycol powder (GLYCOLAX/MIRALAX) powder Take 17 g by mouth daily as needed. For constipation - mix with 8 oz liquid and drink     Potassium Chloride ER 20 MEQ TBCR Take 1 tablet by mouth daily.     potassium chloride SA (KLOR-CON) 20 MEQ tablet Take 1 tablet (20 mEq total) by mouth daily. 30 tablet 0   pravastatin (PRAVACHOL) 40 MG tablet Take 40 mg by mouth daily.     sacubitril-valsartan (ENTRESTO) 49-51 MG Take 1 tablet by mouth 2 (two) times daily. 60 tablet 6    spironolactone (ALDACTONE) 25 MG tablet TAKE 1/2 TABLET BY MOUTH EVERY DAY 45 tablet 3   No current facility-administered medications for this visit.   Allergies:  Patient has no known allergies.   ROS: No syncope, no device shocks.  Physical Exam: VS:  BP 98/62   Pulse 70   Ht 6\' 5"  (1.956 m)   Wt 230 lb 12.8 oz (104.7 kg)   SpO2 97%   BMI 27.37 kg/m , BMI Body mass index is 27.37 kg/m.  Wt Readings from Last 3 Encounters:  08/25/21 230 lb 12.8 oz (104.7 kg)  07/17/21 229 lb 3.2 oz (104 kg)  06/15/21 227 lb 9.6 oz (103.2 kg)    General: Patient appears comfortable at rest. HEENT: Conjunctiva and lids normal, wearing a mask. Neck: Supple, no elevated JVP or carotid bruits, no thyromegaly. Lungs: Clear to auscultation, nonlabored breathing at rest. Cardiac: Irregularly irregular, no S3 or significant systolic murmur. Abdomen: Soft, nontender, bowel sounds present. Extremities: No pitting edema.  ECG:  An ECG dated 07/17/2021 was personally reviewed today and demonstrated:  Atrial fibrillation with PVC, low voltage in the limb leads and nonspecific T wave changes.  Recent Labwork: 05/29/2021: TSH 4.689 05/31/2021: ALT 11; AST 15 06/04/2021: Hemoglobin 11.8; Platelets 143 07/17/2021: B Natriuretic Peptide 179.5 07/27/2021: BUN 24; Creatinine, Ser 1.35; Potassium 4.5; Sodium 137   Other Studies Reviewed Today:  Echocardiogram 07/23/2021:  1. Left ventricular ejection fraction, by estimation, is 40 to 45%. The  left ventricle has low normal function. The left ventricle has no regional  wall motion abnormalities. Left ventricular diastolic parameters are  indeterminate.   2. Right ventricular systolic function is moderately reduced. The right  ventricular size is severely enlarged. There is severely elevated  pulmonary artery systolic pressure.   3. Right atrial size was severely dilated.   4. Moderate pericardial effusion. The pericardial effusion is  circumferential. There is  no evidence of cardiac tamponade.   5. The mitral valve is normal in structure. No evidence of mitral valve  regurgitation. No evidence of mitral stenosis.   6. The tricuspid valve is abnormal. Tricuspid valve regurgitation is  severe.   7. The aortic valve is tricuspid. Aortic valve regurgitation is not  visualized. No aortic stenosis is present.   8. The inferior vena cava is dilated in size with <50% respiratory  variability, suggesting right atrial pressure of 15 mmHg.   Assessment and Plan:  1.  Nonischemic cardiomyopathy with chronic systolic heart failure.  Recent echocardiogram shows LVEF up to the range of 40 to 45%.  He has NYHA class II symptoms at this time at low level activity.  Continue Coreg, Farxiga, Aldactone, as needed Lasix with potassium supplement, and Entresto at current dose.  Blood  pressure low to low normal, but not substantially symptomatic.  May have to cut back on Entresto if this limits use of Adempas.  2.  RV failure with severe pulmonary hypertension.  He is on Adempas.  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  He also has multiple segmental pulmonary artery aneurysms.  Follow-up with pulmonary pending.  He is anticoagulated with Eliquis.  3.  Permanent atrial fibrillation, asymptomatic.  He is on low-dose Coreg and also Eliquis for stroke prophylaxis.  4.  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 Rayann Heman.  Medication Adjustments/Labs and Tests Ordered: Current medicines are reviewed at length with the patient today.  Concerns regarding medicines are outlined above.   Tests Ordered: No orders of the defined types were placed in this encounter.   Medication Changes: No orders of the defined types were placed in this encounter.   Disposition:  Follow up  3 months.  Signed, Satira Sark, MD, [facility] 08/25/2021 9:12 AM    [facility] at [place], [place], [place]  [address] Phone: [phone]; Fax: [phone]

## 2021-08-25 ENCOUNTER — Encounter: Payer: Self-pay | Admitting: Cardiology

## 2021-08-25 ENCOUNTER — Ambulatory Visit: Payer: Medicare HMO | Admitting: Cardiology

## 2021-08-25 VITALS — BP 98/62 | HR 70 | Ht 77.0 in | Wt 230.8 lb

## 2021-08-25 DIAGNOSIS — I5022 Chronic systolic (congestive) heart failure: Secondary | ICD-10-CM

## 2021-08-25 DIAGNOSIS — I272 Pulmonary hypertension, unspecified: Secondary | ICD-10-CM

## 2021-08-25 DIAGNOSIS — I4821 Permanent atrial fibrillation: Secondary | ICD-10-CM | POA: Diagnosis not present

## 2021-08-25 DIAGNOSIS — I428 Other cardiomyopathies: Secondary | ICD-10-CM

## 2021-08-25 NOTE — Patient Instructions (Addendum)
Medication Instructions:   Your physician recommends that you continue on your current medications as directed. Please refer to the Current Medication list given to you today.  Labwork:  none  Testing/Procedures:  none  Follow-Up:  Your physician recommends that you schedule a follow-up appointment in: 3 months.  Any Other Special Instructions Will Be Listed Below (If Applicable).  If you need a refill on your cardiac medications before your next appointment, please call your pharmacy. 

## 2021-09-04 ENCOUNTER — Ambulatory Visit (HOSPITAL_COMMUNITY)
Admission: RE | Admit: 2021-09-04 | Discharge: 2021-09-04 | Disposition: A | Discharge status: Home / Self Care | Payer: Medicare HMO | Source: Ambulatory Visit | Attending: Cardiology | Admitting: Cardiology

## 2021-09-04 ENCOUNTER — Other Ambulatory Visit: Payer: Self-pay

## 2021-09-04 ENCOUNTER — Encounter (HOSPITAL_COMMUNITY): Payer: Self-pay

## 2021-09-04 VITALS — BP 122/78 | HR 94 | Wt 228.0 lb

## 2021-09-04 DIAGNOSIS — I428 Other cardiomyopathies: Secondary | ICD-10-CM | POA: Insufficient documentation

## 2021-09-04 DIAGNOSIS — Z7901 Long term (current) use of anticoagulants: Secondary | ICD-10-CM | POA: Diagnosis not present

## 2021-09-04 DIAGNOSIS — Z86711 Personal history of pulmonary embolism: Secondary | ICD-10-CM | POA: Insufficient documentation

## 2021-09-04 DIAGNOSIS — Z87891 Personal history of nicotine dependence: Secondary | ICD-10-CM | POA: Diagnosis not present

## 2021-09-04 DIAGNOSIS — Z7984 Long term (current) use of oral hypoglycemic drugs: Secondary | ICD-10-CM | POA: Diagnosis not present

## 2021-09-04 DIAGNOSIS — I272 Pulmonary hypertension, unspecified: Secondary | ICD-10-CM | POA: Diagnosis not present

## 2021-09-04 DIAGNOSIS — Z8674 Personal history of sudden cardiac arrest: Secondary | ICD-10-CM | POA: Diagnosis not present

## 2021-09-04 DIAGNOSIS — I4821 Permanent atrial fibrillation: Secondary | ICD-10-CM | POA: Diagnosis not present

## 2021-09-04 DIAGNOSIS — I281 Aneurysm of pulmonary artery: Secondary | ICD-10-CM | POA: Diagnosis not present

## 2021-09-04 DIAGNOSIS — Z8249 Family history of ischemic heart disease and other diseases of the circulatory system: Secondary | ICD-10-CM | POA: Insufficient documentation

## 2021-09-04 DIAGNOSIS — I5022 Chronic systolic (congestive) heart failure: Secondary | ICD-10-CM | POA: Insufficient documentation

## 2021-09-04 DIAGNOSIS — Z95810 Presence of automatic (implantable) cardiac defibrillator: Secondary | ICD-10-CM | POA: Insufficient documentation

## 2021-09-04 LAB — BASIC METABOLIC PANEL
Anion gap: 7 (ref 5–15)
BUN: 19 mg/dL (ref 8–23)
CO2: 22 mmol/L (ref 22–32)
Calcium: 9.5 mg/dL (ref 8.9–10.3)
Chloride: 106 mmol/L (ref 98–111)
Creatinine, Ser: 1.09 mg/dL (ref 0.61–1.24)
GFR, Estimated: >60 mL/min (ref >60)
Glucose, Bld: 102 mg/dL — ABNORMAL HIGH (ref 70–99)
Potassium: 4.5 mmol/L (ref 3.5–5.1)
Sodium: 135 mmol/L (ref 135–145)

## 2021-09-04 MED ORDER — FUROSEMIDE 20 MG PO TABS: 20.0000 mg | ORAL_TABLET | Freq: Every day | ORAL | Status: DC | PRN

## 2021-09-04 NOTE — Patient Instructions (Signed)
Take Furosemide (Lasix) 20 mg ONLY AS NEEDED  Labs done today, we will call you for abnormal results  Your physician recommends that you schedule a follow-up appointment as scheduled 10/21/21  Do the following things EVERYDAY: Weigh yourself in the morning before breakfast. Write it down and keep it in a log. Take your medicines as prescribed Eat low salt foods--Limit salt (sodium) to 2000 mg per day.  Stay as active as you can everyday Limit all fluids for the day to less than 2 liters  If you have any questions or concerns before your next appointment please send [place] a message through [place] or call our office at [phone].    TO LEAVE A MESSAGE FOR THE NURSE SELECT OPTION 2, PLEASE LEAVE A MESSAGE INCLUDING: YOUR NAME DATE OF BIRTH CALL BACK NUMBER REASON FOR CALL**this is important as we prioritize the call backs  YOU WILL RECEIVE A CALL BACK THE SAME DAY AS LONG AS YOU CALL BEFORE 4:00 PM  At the [facility], you and your health needs are our priority. As part of our continuing mission to provide you with exceptional heart care, we have created designated Provider Care Teams. These Care Teams include your primary Cardiologist (physician) and Advanced Practice Providers (APPs- Physician Assistants and Nurse Practitioners) who all work together to provide you with the care you need, when you need it.   You may see any of the following providers on your designated Care Team at your next follow up: Dr Glori Bickers Dr Haynes Kerns, NP Lyda Jester, [place] [facility] [place], [place] Audry Riles, PharmD   Please be sure to bring in all your medications bottles to every appointment.

## 2021-09-04 NOTE — Progress Notes (Signed)
PCP: Glenda Chroman, MD Cardiology: Dr. Domenic Polite HF Cardiology: Dr. Aundra Dubin  [name] [name] is a 65 y.o. with a history of anemia, permanent atrial fibrillation, cardiac arrest 1999, Medtronic  ICD, NICM, HTN.  He has a long history of cardiomyopathy.     In 7/22, he had CT chest that showed multiple aneurysms in his pulmonary arterial bed. PCCM consulted and felt like this could be caused by pulmonary septic emboli. He was also noted to have small segmental PEs.  Started on heparin drip and vanc/cefepime/metronidazone. Lower extremity dopplers negative for DVT. Transferred to [name] for further management. TEE showed no vegetation.  Echo this admission showed EF 35-40%, D-shaped interventricular septum, moderate RV enlargement, mild RV dysfunction, moderate pericardial effusion, severe TR, PASP 74 mmHg.  RHC/LHC showed normal coronaries, moderate PAH.  Patient was diuresed.    Hospital follow up doing well. 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  Today he returns for HF follow up. He does not have significant exertional dyspnea. Overall feeling fine. Denies CP, dizziness, edema, or PND/Orthopnea. Appetite ok. No fever or chills. Weight at home 228 pounds. Taking all medications. Lives with his 2 brothers. BP at home 100s/60s.  Labs (7/22): K 4, creatinine 1.27 Labs (8/22): K 4.8, creatinine 1.37 Labs (9/22): K 4.5, creatinine 1.35  ECG (personally reviewed): none ordered today.  Device Interrogation (personally reviewed): OptiVol stable at threshold, stable thoracic impedence, daily activity 2 hrs, no VT/VF.  PMH:  1.  Permanent atrial fibrillation.  2.  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   - TEE showed no endocarditis, not mycotic aneurysms.  3.  Pulmonary emboli: CT chest in 7/22 showed small segmental PEs.  - V/Q scan (7/22): No acute PE, ?chronic PEs.  4.  Chronic systolic CHF: Nonischemic cardiomyopathy.  Prominent  RV failure.  Medtronic ICD.  - Echo (7/22) with EF 35-40%, D-shaped interventricular septum, moderate RV enlargement, mild RV dysfunction, moderate pericardial effusion, severe TR, PASP 74 mmHg.  - RHC/LHC (7/22): No CAD. Mean RA 7, PA 60/13 mean 29, mean PCWP 8, CI 2.53, PVR 3.5 WU.  5. Pulmonary hypertension: RHC (7/22) with Mean RA 7, PA 60/13 mean 29, mean PCWP 8, CI 2.53, PVR 3.5 WU. Echo in 7/22 with RV failure.  - V/Q scan with possible segmental chronic PEs.  - ANA, ANCA, HIV negative 7/22.  6. Cardiac arrest 1999: MDT ICD.  7. Pericardial effusion: moderate on last echo.   Social History   Socioeconomic History   Marital status: Single    Spouse name: Not on file   Number of children: Not on file   Years of education: Not on file   Highest education level: Not on file  Occupational History   Occupation: DIABLED  Tobacco Use   Smoking status: Former    Packs/day: 0.80    Years: 6.00    Pack years: 4.80    Types: Cigarettes    Start date: 05/13/1978    Quit date: 11/15/1996    Years since quitting: 24.8   Smokeless tobacco: Never  Vaping Use   Vaping Use: Never used  Substance and Sexual Activity   Alcohol use: No    Alcohol/week: 0.0 standard drinks   Drug use: Not Currently    Types: Marijuana    Comment: Many years ago   Sexual activity: Never  Other Topics Concern   Not on file  Social History Narrative   Lives in [place]  with his father and 2 brothers.   Social Determinants of Health   Financial Resource Strain: Low Risk    Difficulty of Paying Living Expenses: Not very hard  Food Insecurity: No Food Insecurity   Worried About Charity fundraiser in the Last Year: Never true   Arboriculturist in the Last Year: Never true  Transportation Needs: Unmet Transportation Needs   Lack of Transportation (Medical): Yes   Lack of Transportation (Non-Medical): Yes  Physical Activity: Not on file  Stress: Not on file  Social Connections: Not on file  Intimate  Partner Violence: Not on file   Family History  Problem Relation Age of Onset   Diabetes Mother        Died @ 27.   Hypertension Father        Alive @ 25.   Alzheimer's disease Father    Diabetes Brother        Brother also has htn   Hypertension Brother    ROS: All systems reviewed and negative except as per HPI.   Current Outpatient Medications  Medication Sig Dispense Refill   ADEMPAS 1 MG TABS Take 1.5 mg by mouth 3 (three) times daily.     carvedilol (COREG) 3.125 MG tablet Take 1 tablet (3.125 mg total) by mouth 2 (two) times daily with a meal. 60 tablet 0   Cholecalciferol (VITAMIN D3) 2000 UNITS capsule Take 2,000 Units by mouth daily.     dapagliflozin propanediol (FARXIGA) 10 MG TABS tablet Take 1 tablet (10 mg total) by mouth daily before breakfast. 30 tablet 6   ELIQUIS 5 MG TABS tablet TAKE 1 TABLET BY MOUTH TWICE A DAY 60 tablet 6   finasteride (PROSCAR) 5 MG tablet TAKE 1 TABLET BY MOUTH EVERY DAY 90 tablet 3   furosemide (LASIX) 20 MG tablet Take 20 mg by mouth daily.     polyethylene glycol powder (GLYCOLAX/MIRALAX) powder Take 17 g by mouth daily as needed. For constipation - mix with 8 oz liquid and drink     potassium chloride SA (KLOR-CON) 20 MEQ tablet Take 1 tablet (20 mEq total) by mouth daily. 30 tablet 0   pravastatin (PRAVACHOL) 40 MG tablet Take 40 mg by mouth daily.     sacubitril-valsartan (ENTRESTO) 49-51 MG Take 1 tablet by mouth 2 (two) times daily. 60 tablet 6   spironolactone (ALDACTONE) 25 MG tablet TAKE 1/2 TABLET BY MOUTH EVERY DAY 45 tablet 3   No current facility-administered medications for this encounter.   Wt Readings from Last 3 Encounters:  09/04/21 103.4 kg (228 lb)  08/25/21 104.7 kg (230 lb 12.8 oz)  07/17/21 104 kg (229 lb 3.2 oz)   BP 122/78   Pulse 94   Wt 103.4 kg (228 lb)   SpO2 95%   BMI 27.04 kg/m   General:  NAD. No resp difficulty HEENT: Normal Neck: Supple. No JVD. Carotids 2+ bilat; no bruits. No lymphadenopathy  or thryomegaly appreciated. Cor: PMI nondisplaced. Irregular rate & rhythm. No rubs, gallops or murmurs. Lungs: Clear Abdomen: Soft, nontender, nondistended. No hepatosplenomegaly. No bruits or masses. Good bowel sounds. Extremities: No cyanosis, clubbing, rash, edema Neuro: Alert & oriented x 3, cranial nerves grossly intact. Moves all 4 extremities w/o difficulty. Affect pleasant.  Assessment/Plan: 1. Chronic systolic CHF with prominent RV failure: Cardiomyopathy x years with MDT ICD since 1999 cardiac arrest. No significant CAD on LHC in 7/22.  Nonischemic cardiomyopathy.  Echo in 7/22 showed EF 35-40%, D-shaped IV  septum with mildly decreased RV systolic function and moderate RVE, moderate pericardial effusion, severe TR, dilated IVC, PASP 74 mmHg. Seems to have predominant RV failure.  He is not overloaded on exam or by OptiVol, NYHA II, although he is not very active. - Suspect that his ICD is not MRI compatible (old model) so probably will not be able to get cardiac MRI. - Continue Entresto 49/51 mg bid. BMET today. - Change Lasix 20 mg to PRN weight gain/edema. - Continue spironolactone 12.5 mg daily.  - Continue Coreg 3.125 mg bid.  - Continue dapagliflozin 10 mg daily.  2. Pulmonary hypertension: Moderate PAH on 7/22 RHC, RV failure with severe TR noted on 7/22 echo.  Cause of PH + RV failure uncertain.  Patient has segmental PA aneurysms (see below).  ?Chronic PEs => CTA looked like possible small segmental PEs. V/Q scan did not show acute PEs, possible small chronic PEs.  ANA, ANCA, HIV negative.  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  Do not think that the small PEs would be amenable to thromboendarterectomy.  3. Multiple segmental pulmonary artery aneurysms: Noted on CT chest in 7/22.  Cause is uncertain.  Initially thought to be mycotic aneurysms, but TEE showed no endocarditis and blood cultures negative.  CTA  reviewed with Dr. Carlis Abbott (pulmonary), he may have small PEs but this does not appear to explain the pulmonary aneurysms.  ?Inflammatory disease such as Behcet's syndrome with elevated ESR and CRP.  ANCA and ANA negative.  He does not have a prominent history of mucocutaneous ulcers or other findings suggestive of Behcets.   - Etiology remains unclear, he has been referred to pulmonary but has no scheduled an appointment yet.  4. Atrial fibrillation:  Chronic/permanent.  - Continue apixaban. No abnormal bleeding. - Reasonable rate control on low dose Coreg.  5. PE: Possible small segmental PEs on CT chest 7/22. - Continue apixaban.   Followup with Dr. Aundra Dubin in 2 months as scheduled.  [name] [name] 09/04/2021

## 2021-09-18 ENCOUNTER — Ambulatory Visit: Payer: Medicare HMO | Admitting: Internal Medicine

## 2021-09-18 ENCOUNTER — Encounter: Payer: Self-pay | Admitting: *Deleted

## 2021-09-18 VITALS — BP 108/60 | HR 89 | Ht >= 80 in | Wt 229.2 lb

## 2021-09-18 DIAGNOSIS — I4821 Permanent atrial fibrillation: Secondary | ICD-10-CM | POA: Diagnosis not present

## 2021-09-18 DIAGNOSIS — I5022 Chronic systolic (congestive) heart failure: Secondary | ICD-10-CM | POA: Diagnosis not present

## 2021-09-18 DIAGNOSIS — D6869 Other thrombophilia: Secondary | ICD-10-CM

## 2021-09-18 DIAGNOSIS — I428 Other cardiomyopathies: Secondary | ICD-10-CM

## 2021-09-18 DIAGNOSIS — Z95810 Presence of automatic (implantable) cardiac defibrillator: Secondary | ICD-10-CM

## 2021-09-18 DIAGNOSIS — I10 Essential (primary) hypertension: Secondary | ICD-10-CM

## 2021-09-18 NOTE — Progress Notes (Signed)
PCP: Glenda Chroman, MD Primary Cardiologist: Dr Domenic Polite CHF:  Aundra Dubin Primary EP: Dr Rayann Heman  [name] [name] is a 65 y.o. male who presents today for routine electrophysiology followup.  Since last being seen in our clinic, the patient reports doing very well.  Today, he denies symptoms of palpitations, chest pain, shortness of breath (above baseline),  lower extremity edema, dizziness, presyncope, syncope, or ICD shocks.  The patient is otherwise without complaint today.   Past Medical History:  Diagnosis Date   Anemia    Atrial fibrillation ([place])    AVM (arteriovenous malformation)    Duodenum - nonbleeding and EGD 11/13   Cardiac arrest ([place]) 1998   ICD implanted at [facility]   Cardiomyopathy, nonischemic ([place])    LVEF 64-68%   Chronic systolic heart failure ([place])    Essential hypertension    History of colonic polyps    Colonoscopy 11/13   Pneumonia    Severe with respiratory failure 10/13   Past Surgical History:  Procedure Laterality Date   [place]   COLONOSCOPY  09/26/2012   Procedure: COLONOSCOPY;  Surgeon: Beryle Beams, MD;  Location: [place];  Service: Endoscopy;  Laterality: N/A;   Defibrillator system revision  04/12/12   MDT Protecta XT VR implanted at [facility] by Dr Deno Etienne with previously implanted system and leads extracted due to RV lead failure   ESOPHAGOGASTRODUODENOSCOPY  09/25/2012   Procedure: ESOPHAGOGASTRODUODENOSCOPY (EGD);  Surgeon: Beryle Beams, MD;  Location: [facility] ENDOSCOPY;  Service: Endoscopy;  Laterality: N/A;   RIGHT/LEFT HEART CATH AND CORONARY ANGIOGRAPHY N/A 06/03/2021   Procedure: RIGHT/LEFT HEART CATH AND CORONARY ANGIOGRAPHY;  Surgeon: Larey Dresser, MD;  Location: [place];  Service: Cardiovascular;  Laterality: N/A;   TEE WITHOUT CARDIOVERSION N/A 06/02/2021   Procedure: TRANSESOPHAGEAL ECHOCARDIOGRAM (TEE);  Surgeon: Lelon Perla, MD;  Location: [facility] ENDOSCOPY;  Service: Cardiovascular;   Laterality: N/A;    ROS- all systems are reviewed and negative except as per HPI above  Current Outpatient Medications  Medication Sig Dispense Refill   carvedilol (COREG) 3.125 MG tablet Take 1 tablet (3.125 mg total) by mouth 2 (two) times daily with a meal. 60 tablet 0   Cholecalciferol (VITAMIN D3) 2000 UNITS capsule Take 2,000 Units by mouth daily.     dapagliflozin propanediol (FARXIGA) 10 MG TABS tablet Take 1 tablet (10 mg total) by mouth daily before breakfast. 30 tablet 6   ELIQUIS 5 MG TABS tablet TAKE 1 TABLET BY MOUTH TWICE A DAY 60 tablet 6   finasteride (PROSCAR) 5 MG tablet TAKE 1 TABLET BY MOUTH EVERY DAY 90 tablet 3   furosemide (LASIX) 20 MG tablet Take 1 tablet (20 mg total) by mouth daily as needed for fluid or edema. 30 tablet    polyethylene glycol powder (GLYCOLAX/MIRALAX) powder Take 17 g by mouth daily as needed. For constipation - mix with 8 oz liquid and drink     potassium chloride SA (KLOR-CON) 20 MEQ tablet Take 1 tablet (20 mEq total) by mouth daily. 30 tablet 0   pravastatin (PRAVACHOL) 40 MG tablet Take 40 mg by mouth daily.     Riociguat (ADEMPAS) 2 MG TABS Take 2 mg by mouth 3 (three) times daily.     sacubitril-valsartan (ENTRESTO) 49-51 MG Take 1 tablet by mouth 2 (two) times daily. 60 tablet 6   spironolactone (ALDACTONE) 25 MG tablet TAKE 1/2 TABLET BY MOUTH EVERY DAY 45 tablet 3   No  current facility-administered medications for this visit.    Physical Exam: Vitals:   09/18/21 0854  Weight: 229 lb 3.2 oz (104 kg)  Height: 7' (2.134 m)    GEN- The patient is well appearing, alert and oriented x 3 today.   Head- normocephalic, atraumatic Eyes-  Sclera clear, conjunctiva pink Ears- hearing intact Oropharynx- clear Lungs- Clear to ausculation bilaterally, normal work of breathing Chest- ICD pocket is well healed Heart- Regular rate and rhythm, no murmurs, rubs or gallops, PMI not laterally displaced GI- soft, NT, ND, + BS Extremities- no  clubbing, cyanosis, or edema  ICD interrogation- reviewed in detail today,  See PACEART report  ekg tracing ordered today is personally reviewed and shows afib, V rates 89 bpm, QRS 96 msec, anterior infact pattern  Wt Readings from Last 3 Encounters:  09/18/21 229 lb 3.2 oz (104 kg)  09/04/21 228 lb (103.4 kg)  08/25/21 230 lb 12.8 oz (104.7 kg)    Assessment and Plan:  1.  Chronic systolic dysfunction/ nonischemic CM euvolemic today Stable on an appropriate medical regimen Normal ICD function R waves today were again noted to be low (2 mV).  However tip to coil R waves were noted to be 5.8.  I have therefore programmed to use tip to coil R waves going forward Implanted for secondary prevention with prior cardiac arrest See [facility] report No changes today he is not device dependant today followed in ICM device clinic Approaching ERI Risks, benefits, and alternatives to ICD pulse generator replacement were discussed in detail today.  The patient understands that risks include but are not limited to bleeding, infection, pneumothorax, perforation, tamponade, vascular damage, renal failure, MI, stroke, death, inappropriate shocks, damage to his existing leads, and lead dislodgement and wishes to proceed once ERI. He has a narrow QRS and does not meet criteria for upgrade to CRT.  Once ERI, ok to schedule ICD generator change.  Hold eliquis 24 hours prior to the procedure.  2. HTN Stable No change required today Labs 09/04/21 reviewed  3. Permanent atrial fibrillation Rate controlled Chads2vasc score is 2.  He is on eliquis Labs 09/04/21 reviewed  Risks, benefits and potential toxicities for medications prescribed and/or refilled reviewed with patient today.   Thompson Grayer MD, [facility] 09/18/2021 8:59 AM

## 2021-09-18 NOTE — Patient Instructions (Signed)
Medication Instructions:  Continue all current medications.  Labwork: none  Testing/Procedures: Gen change pending   Follow-Up: Pending    Any Other Special Instructions Will Be Listed Below (If Applicable).   If you need a refill on your cardiac medications before your next appointment, please call your pharmacy.

## 2021-10-19 ENCOUNTER — Ambulatory Visit (INDEPENDENT_AMBULATORY_CARE_PROVIDER_SITE_OTHER): Payer: Medicare HMO

## 2021-10-19 DIAGNOSIS — I428 Other cardiomyopathies: Secondary | ICD-10-CM

## 2021-10-19 LAB — CUP PACEART REMOTE DEVICE CHECK
Battery Voltage: 2.63 V
Brady Statistic RV Percent Paced: 0.1 %
Date Time Interrogation Session: 20221205033524
HighPow Impedance: 285 Ohm
HighPow Impedance: 45 Ohm
Implantable Lead Implant Date: 20130529
Implantable Lead Location: 753860
Implantable Lead Model: 6935
Implantable Pulse Generator Implant Date: 20130529
Lead Channel Impedance Value: 361 Ohm
Lead Channel Pacing Threshold Amplitude: 1 V
Lead Channel Pacing Threshold Pulse Width: 0.4 ms
Lead Channel Sensing Intrinsic Amplitude: 3.75 mV
Lead Channel Sensing Intrinsic Amplitude: 3.75 mV
Lead Channel Setting Pacing Amplitude: 2.5 V
Lead Channel Setting Pacing Pulse Width: 0.4 ms
Lead Channel Setting Sensing Sensitivity: 0.3 mV

## 2021-10-20 ENCOUNTER — Other Ambulatory Visit (HOSPITAL_COMMUNITY): Payer: Self-pay | Admitting: Family Medicine

## 2021-10-21 ENCOUNTER — Ambulatory Visit (HOSPITAL_COMMUNITY)
Admission: RE | Admit: 2021-10-21 | Discharge: 2021-10-21 | Disposition: A | Discharge status: Home / Self Care | Payer: Medicare HMO | Source: Ambulatory Visit | Attending: Cardiology | Admitting: Cardiology

## 2021-10-21 ENCOUNTER — Other Ambulatory Visit: Payer: Self-pay

## 2021-10-21 ENCOUNTER — Encounter (HOSPITAL_COMMUNITY): Payer: Self-pay | Admitting: Cardiology

## 2021-10-21 ENCOUNTER — Telehealth (HOSPITAL_COMMUNITY): Payer: Self-pay | Admitting: Pharmacist

## 2021-10-21 VITALS — BP 90/60 | HR 70 | Wt 228.6 lb

## 2021-10-21 DIAGNOSIS — Z79899 Other long term (current) drug therapy: Secondary | ICD-10-CM | POA: Diagnosis not present

## 2021-10-21 DIAGNOSIS — R0600 Dyspnea, unspecified: Secondary | ICD-10-CM | POA: Insufficient documentation

## 2021-10-21 DIAGNOSIS — Z09 Encounter for follow-up examination after completed treatment for conditions other than malignant neoplasm: Secondary | ICD-10-CM | POA: Insufficient documentation

## 2021-10-21 DIAGNOSIS — I272 Pulmonary hypertension, unspecified: Secondary | ICD-10-CM | POA: Insufficient documentation

## 2021-10-21 DIAGNOSIS — I4821 Permanent atrial fibrillation: Secondary | ICD-10-CM | POA: Insufficient documentation

## 2021-10-21 DIAGNOSIS — Z8674 Personal history of sudden cardiac arrest: Secondary | ICD-10-CM | POA: Insufficient documentation

## 2021-10-21 DIAGNOSIS — Z87891 Personal history of nicotine dependence: Secondary | ICD-10-CM | POA: Insufficient documentation

## 2021-10-21 DIAGNOSIS — I3139 Other pericardial effusion (noninflammatory): Secondary | ICD-10-CM | POA: Diagnosis not present

## 2021-10-21 DIAGNOSIS — Z7901 Long term (current) use of anticoagulants: Secondary | ICD-10-CM | POA: Insufficient documentation

## 2021-10-21 DIAGNOSIS — D649 Anemia, unspecified: Secondary | ICD-10-CM | POA: Insufficient documentation

## 2021-10-21 DIAGNOSIS — I428 Other cardiomyopathies: Secondary | ICD-10-CM | POA: Diagnosis not present

## 2021-10-21 DIAGNOSIS — I5022 Chronic systolic (congestive) heart failure: Secondary | ICD-10-CM

## 2021-10-21 DIAGNOSIS — R0789 Other chest pain: Secondary | ICD-10-CM | POA: Diagnosis present

## 2021-10-21 DIAGNOSIS — I281 Aneurysm of pulmonary artery: Secondary | ICD-10-CM | POA: Insufficient documentation

## 2021-10-21 DIAGNOSIS — I5023 Acute on chronic systolic (congestive) heart failure: Secondary | ICD-10-CM | POA: Insufficient documentation

## 2021-10-21 LAB — BASIC METABOLIC PANEL
Anion gap: 9 (ref 5–15)
BUN: 17 mg/dL (ref 8–23)
CO2: 25 mmol/L (ref 22–32)
Calcium: 9.5 mg/dL (ref 8.9–10.3)
Chloride: 101 mmol/L (ref 98–111)
Creatinine, Ser: 1.4 mg/dL — ABNORMAL HIGH (ref 0.61–1.24)
GFR, Estimated: 56 mL/min — ABNORMAL LOW (ref >60)
Glucose, Bld: 110 mg/dL — ABNORMAL HIGH (ref 70–99)
Potassium: 4.8 mmol/L (ref 3.5–5.1)
Sodium: 135 mmol/L (ref 135–145)

## 2021-10-21 LAB — BRAIN NATRIURETIC PEPTIDE: B Natriuretic Peptide: 253.8 pg/mL — ABNORMAL HIGH (ref 0.0–100.0)

## 2021-10-21 MED ORDER — FUROSEMIDE 20 MG PO TABS: 20.0000 mg | ORAL_TABLET | Freq: Every day | ORAL | 3 refills | Status: DC

## 2021-10-21 MED ORDER — POTASSIUM CHLORIDE CRYS ER 10 MEQ PO TBCR: 10.0000 meq | EXTENDED_RELEASE_TABLET | Freq: Every day | ORAL | 3 refills | Status: DC

## 2021-10-21 MED ORDER — OPSUMIT 10 MG PO TABS: 10.0000 mg | ORAL_TABLET | Freq: Every day | ORAL | 11 refills | Status: AC

## 2021-10-21 MED ORDER — SPIRONOLACTONE 25 MG PO TABS: 25.0000 mg | ORAL_TABLET | Freq: Every day | ORAL | 3 refills | Status: DC

## 2021-10-21 NOTE — Telephone Encounter (Signed)
Patient Advocate Encounter   Received notification from [facility] that prior authorization for Opsumit is required.   PA submitted on CoverMyMeds Key [id number] Status is pending   Will continue to follow.   Audry Riles, PharmD, BCPS, BCCP, CPP Heart Failure Clinic Pharmacist [phone]

## 2021-10-21 NOTE — Patient Instructions (Signed)
INCREASE Lasix to 20 mg, one tab daily CHANGE Potassium to 10 meq one tab daily INCREASE Spironolactone to 25 mg, one tab daily  START Opsumit 10 mg, one tab daily -this will come from a speciality pharmacy  Labs today We will only contact you if something comes back abnormal or we need to make some changes. Otherwise no news is good news!  Labs needed on 7-10 days  You have been referred to [facility] Pulmonary at [facility] -they will be in contact with an appointment  Your physician recommends that you schedule a follow-up appointment in: 2 months  in the Advanced Practitioners (PA/NP) Clinic    Do the following things EVERYDAY: Weigh yourself in the morning before breakfast. Write it down and keep it in a log. Take your medicines as prescribed Eat low salt foods--Limit salt (sodium) to 2000 mg per day.  Stay as active as you can everyday Limit all fluids for the day to less than 2 liters  At the [facility], you and your health needs are our priority. As part of our continuing mission to provide you with exceptional heart care, we have created designated Provider Care Teams. These Care Teams include your primary Cardiologist (physician) and Advanced Practice Providers (APPs- Physician Assistants and Nurse Practitioners) who all work together to provide you with the care you need, when you need it.   You may see any of the following providers on your designated Care Team at your next follow up: Dr Glori Bickers Dr Haynes Kerns, NP Lyda Jester, [place] [facility] [place], [place] Audry Riles, PharmD   Please be sure to bring in all your medications bottles to every appointment.

## 2021-10-21 NOTE — Progress Notes (Signed)
PCP: Glenda Chroman, MD Cardiology: Dr. Domenic Polite HF Cardiology: Dr. Aundra Dubin  [name] is a 65 y.o. with a history of anemia, permanent atrial fibrillation, cardiac arrest 1999, Medtronic  ICD, NICM, HTN.  He has a long history of cardiomyopathy.     In 7/22, he had CT chest that showed multiple aneurysms in his pulmonary arterial bed. PCCM consulted and felt like this could be caused by pulmonary septic emboli. He was also noted to have small segmental PEs.  Started on heparin drip and vanc/cefepime/metronidazone. Lower extremity dopplers negative for DVT. Transferred to [name] for further management. TEE showed no vegetation.  Echo this admission showed EF 35-40%, D-shaped interventricular septum, moderate RV enlargement, mild RV dysfunction, moderate pericardial effusion, severe TR, PASP 74 mmHg.  RHC/LHC showed normal coronaries, moderate PAH.  Patient was diuresed.   Echo in 9/22 showed EF 40-45%, moderately decreased RV systolic function with severe RV enlargement, severe TR, dilated IVC, moderate pericardial effusion.    He returns today for followup of CHF.  Weight is stable.  He has rare atypical chest pain.  No lightheadedness.  He is taking Lasix about 3 times/week.  He is short of breath walking a "long distance."  Breathing is better on Adempas.  Mild dyspnea walking up stairs.    Labs (7/22): K 4, creatinine 1.27 Labs (10/22): K 4.5, creatinine 1.09  6 minute walk (12/22): 305 m  Medtronic device interrogation: Fluid index > threshold since 10/22.   PMH:  1.  Permanent atrial fibrillation.  2.  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   - TEE showed no endocarditis, not mycotic aneurysms.  3.  Pulmonary emboli: CT chest in 7/22 showed small segmental PEs.  - V/Q scan (7/22): No acute PE, ?chronic PEs.  4.  Chronic systolic CHF: Nonischemic cardiomyopathy.  Prominent RV failure.  Medtronic ICD.  - Echo (7/22) with EF 35-40%,  D-shaped interventricular septum, moderate RV enlargement, mild RV dysfunction, moderate pericardial effusion, severe TR, PASP 74 mmHg.  - RHC/LHC (7/22): No CAD. Mean RA 7, PA 60/13 mean 29, mean PCWP 8, CI 2.53, PVR 3.5 WU.  - Echo (9/22): EF 40-45%, moderately decreased RV systolic function with severe RV enlargement, severe TR, dilated IVC, moderate pericardial effusion 5. Pulmonary hypertension: RHC (7/22) with Mean RA 7, PA 60/13 mean 29, mean PCWP 8, CI 2.53, PVR 3.5 WU. Echo in 7/22 with RV failure.  - V/Q scan with possible segmental chronic PEs.  - ANA, ANCA, HIV negative 7/22.  6. Cardiac arrest 1999: MDT ICD.  7. Pericardial effusion: moderate on last echo.   Social History   Socioeconomic History   Marital status: Single    Spouse name: Not on file   Number of children: Not on file   Years of education: Not on file   Highest education level: Not on file  Occupational History   Occupation: DIABLED  Tobacco Use   Smoking status: Former    Packs/day: 0.80    Years: 6.00    Pack years: 4.80    Types: Cigarettes    Start date: 05/13/1978    Quit date: 11/15/1996    Years since quitting: 24.9   Smokeless tobacco: Never  Vaping Use   Vaping Use: Never used  Substance and Sexual Activity   Alcohol use: No    Alcohol/week: 0.0 standard drinks   Drug use: Not Currently    Types: Marijuana    Comment: Many  years ago   Sexual activity: Never  Other Topics Concern   Not on file  Social History Narrative   Lives in [place] with his father and 2 brothers.   Social Determinants of Health   Financial Resource Strain: Low Risk    Difficulty of Paying Living Expenses: Not very hard  Food Insecurity: No Food Insecurity   Worried About Charity fundraiser in the Last Year: Never true   Arboriculturist in the Last Year: Never true  Transportation Needs: Unmet Transportation Needs   Lack of Transportation (Medical): Yes   Lack of Transportation (Non-Medical): Yes  Physical  Activity: Not on file  Stress: Not on file  Social Connections: Not on file  Intimate Partner Violence: Not on file   Family History  Problem Relation Age of Onset   Diabetes Mother        Died @ 71.   Hypertension Father        Alive @ 30.   Alzheimer's disease Father    Diabetes Brother        Brother also has htn   Hypertension Brother    ROS: All systems reviewed and negative except as per HPI.   Current Outpatient Medications  Medication Sig Dispense Refill   carvedilol (COREG) 3.125 MG tablet Take 1 tablet (3.125 mg total) by mouth 2 (two) times daily with a meal. 60 tablet 0   Cholecalciferol (VITAMIN D3) 2000 UNITS capsule Take 2,000 Units by mouth daily.     dapagliflozin propanediol (FARXIGA) 10 MG TABS tablet Take 1 tablet (10 mg total) by mouth daily before breakfast. 30 tablet 6   ELIQUIS 5 MG TABS tablet TAKE 1 TABLET BY MOUTH TWICE A DAY 60 tablet 6   finasteride (PROSCAR) 5 MG tablet TAKE 1 TABLET BY MOUTH EVERY DAY 90 tablet 3   macitentan (OPSUMIT) 10 MG tablet Take 1 tablet (10 mg total) by mouth daily. 30 tablet 11   polyethylene glycol powder (GLYCOLAX/MIRALAX) powder Take 17 g by mouth daily as needed. For constipation - mix with 8 oz liquid and drink     pravastatin (PRAVACHOL) 40 MG tablet Take 40 mg by mouth daily.     Riociguat (ADEMPAS) 2 MG TABS Take 2 mg by mouth 3 (three) times daily.     sacubitril-valsartan (ENTRESTO) 49-51 MG Take 1 tablet by mouth 2 (two) times daily. 60 tablet 6   furosemide (LASIX) 20 MG tablet Take 1 tablet (20 mg total) by mouth daily. 30 tablet 6   furosemide (LASIX) 20 MG tablet Take 1 tablet (20 mg total) by mouth daily. 90 tablet 3   potassium chloride SA (KLOR-CON M) 10 MEQ tablet Take 1 tablet (10 mEq total) by mouth daily. 90 tablet 3   spironolactone (ALDACTONE) 25 MG tablet Take 1 tablet (25 mg total) by mouth daily. 90 tablet 3   No current facility-administered medications for this encounter.   BP 90/60   Pulse 70    Wt 103.7 kg (228 lb 9.6 oz)   SpO2 96%   BMI 22.78 kg/m  General: NAD Neck: JVP 8-9 cm, no thyromegaly or thyroid nodule.  Lungs: Clear to auscultation bilaterally with normal respiratory effort. CV: Nondisplaced PMI.  Heart irregular S1/S2, no S3/S4, 2/6 HSM LLSB.  No peripheral edema.  No carotid bruit.  Normal pedal pulses.  Abdomen: Soft, nontender, no hepatosplenomegaly, no distention.  Skin: Intact without lesions or rashes.  Neurologic: Alert and oriented x 3.  Psych:  Normal affect. Extremities: No clubbing or cyanosis.  HEENT: Normal.   Assessment/Plan: 1. Acute on chronic systolic CHF with prominent RV failure: Cardiomyopathy x years with MDT ICD since 1999 cardiac arrest. No significant CAD on LHC in 7/22.  Nonischemic cardiomyopathy.  Echo in 7/22 showed EF 35-40%, D-shaped IV septum with mildly decreased RV systolic function and moderate RVE, moderate pericardial effusion, severe TR, dilated IVC, PASP 74 mmHg. Seems to have predominant RV failure.  Echo in 9/22 showed EF 40-45%, moderately decreased RV systolic function with severe RV enlargement, severe TR, dilated IVC, moderate pericardial effusion. NYHA class II, patient is volume overloaded by exam and Optivol.  - Suspect that his ICD is not MRI compatible (old model) so probably will not be able to get cardiac MRI. - I will have him start taking Lasix 20 mg daily.  BMET/BNP today and in 10 days.  - Continue Entresto 49/51 bid.  - Increase spironolactone 25 mg daily.   - Continue Coreg 3.125 mg bid.  - Continue dapagliflozin 10 mg daily.  2. Pulmonary hypertension: Moderate PAH on 7/22 RHC, RV failure with severe TR noted on 9/22 echo.  Cause of PH + RV failure uncertain.  Patient has segmental PA aneurysms (see below).  ?Chronic PEs => CTA looked like possible small segmental PEs. V/Q scan did not show acute PEs, possible small chronic PEs.  ANA, ANCA, HIV negative.  Possible group 1 and group 4 PH. 6 minute walk done  today.  - Continue riociguat 2 mg tid for possible CTEPH given PAH and segmental PEs.  I do not think that the small PEs would be amenable to thromboendarterectomy.  - Given improvement in symptoms with Adempas, I will add Opsumit 10 mg daily.  3. Multiple segmental pulmonary artery aneurysms: Noted on CT chest in 7/22.  Cause is uncertain.  Initially thought to be mycotic aneurysms, but TEE showed no endocarditis and blood cultures negative.  CTA reviewed with Dr. Carlis Abbott (pulmonary), he may have small PEs but this does not appear to explain the pulmonary aneurysms.  ?Inflammatory disease such as Behcet's syndrome with elevated ESR and CRP.  ANCA and ANA negative.  He does not have a prominent history of mucocutaneous ulcers or other findings suggestive of Behcets.   - I am still not sure of the etiology, will have him followup with pulmonary => will try to make appt with pulmonary in [place].   4. Atrial fibrillation:  Chronic/permanent.  - Continue apixaban.  - Reasonable rate control on low dose Coreg.  5. PE: Possible small segmental PEs on CT chest 7/22. . - Continue apixaban.   Followup in 2 months with APP.   Loralie Champagne 10/21/2021

## 2021-10-21 NOTE — Progress Notes (Signed)
6 Min Walk Test Completed  Pt ambulated 304.8 meters O2 Sat ranged 90-96% on room air  HR ranged 90-150

## 2021-10-22 NOTE — Telephone Encounter (Signed)
Advanced Heart Failure Patient Advocate Encounter  Prior Authorization for Opsumit has been approved.    Effective dates: 11/15/20 through 11/14/21  Audry Riles, PharmD, BCPS, BCCP, CPP Heart Failure Clinic Pharmacist [phone]

## 2021-10-22 NOTE — Telephone Encounter (Signed)
Sent in [place] enrollment application to [place], PharmD, BCPS, [place], [facility] Pharmacist [phone]

## 2021-10-24 NOTE — Progress Notes (Deleted)
Synopsis: Referred for multiple pulmonary arterial aneurysms, PH by Larey Dresser, MD  Subjective:   PATIENT ID: [name] [name] GENDER: male DOB: 1956/11/11, MRN: [id number]  No chief complaint on file.  39yM with history of AVMs, AF, NICM, cardiac arrest s/p ICD, PH on macitentan and riociguat referred for ?pavms vs multiple mycotic aneuryms due to septic embolization.  Pulmonary arterial aneurysms discovered along with segmental PEs this past summer. Our team was consulted. Had unrevealing autoimmune and septic/embolic workup. Was found to have multifactorial PAH (see RHC results below) started initially on riociguat and eliquis with improved DOE, macitentan added by Dr. Aundra Dubin 12/7. Takes lasix 20mg  TIW with stable weight.    Otherwise pertinent review of systems is negative.  Past Medical History:  Diagnosis Date   Anemia    Atrial fibrillation ([place])    AVM (arteriovenous malformation)    Duodenum - nonbleeding and EGD 11/13   Cardiac arrest ([place]) 1998   ICD implanted at [facility]   Cardiomyopathy, nonischemic (HCC)    LVEF 32-99%   Chronic systolic heart failure (HCC)    Essential hypertension    History of colonic polyps    Colonoscopy 11/13   Pneumonia    Severe with respiratory failure 10/13     Family History  Problem Relation Age of Onset   Diabetes Mother        Died @ 14.   Hypertension Father        Alive @ 29.   Alzheimer's disease Father    Diabetes Brother        Brother also has htn   Hypertension Brother      Past Surgical History:  Procedure Laterality Date   CARDIAC DEFIBRILLATOR PLACEMENT  1998   COLONOSCOPY  09/26/2012   Procedure: COLONOSCOPY;  Surgeon: Beryle Beams, MD;  Location: [place];  Service: Endoscopy;  Laterality: N/A;   Defibrillator system revision  04/12/12   MDT Protecta XT VR implanted at [facility] by Dr Deno Etienne with previously implanted system and leads extracted due to RV lead failure   ESOPHAGOGASTRODUODENOSCOPY   09/25/2012   Procedure: ESOPHAGOGASTRODUODENOSCOPY (EGD);  Surgeon: Beryle Beams, MD;  Location: [facility] ENDOSCOPY;  Service: Endoscopy;  Laterality: N/A;   RIGHT/LEFT HEART CATH AND CORONARY ANGIOGRAPHY N/A 06/03/2021   Procedure: RIGHT/LEFT HEART CATH AND CORONARY ANGIOGRAPHY;  Surgeon: Larey Dresser, MD;  Location: [facility];  Service: Cardiovascular;  Laterality: N/A;   TEE WITHOUT CARDIOVERSION N/A 06/02/2021   Procedure: TRANSESOPHAGEAL ECHOCARDIOGRAM (TEE);  Surgeon: Lelon Perla, MD;  Location: [facility] ENDOSCOPY;  Service: Cardiovascular;  Laterality: N/A;    Social History   Socioeconomic History   Marital status: Single    Spouse name: Not on file   Number of children: Not on file   Years of education: Not on file   Highest education level: Not on file  Occupational History   Occupation: DIABLED  Tobacco Use   Smoking status: Former    Packs/day: 0.80    Years: 6.00    Pack years: 4.80    Types: Cigarettes    Start date: 05/13/1978    Quit date: 11/15/1996    Years since quitting: 24.9   Smokeless tobacco: Never  Vaping Use   Vaping Use: Never used  Substance and Sexual Activity   Alcohol use: No    Alcohol/week: 0.0 standard drinks   Drug use: Not Currently    Types: Marijuana    Comment: Many years ago  Sexual activity: Never  Other Topics Concern   Not on file  Social History Narrative   Lives in [place] with his father and 2 brothers.   Social Determinants of Health   Financial Resource Strain: Low Risk    Difficulty of Paying Living Expenses: Not very hard  Food Insecurity: No Food Insecurity   Worried About Charity fundraiser in the Last Year: Never true   Arboriculturist in the Last Year: Never true  Transportation Needs: Unmet Transportation Needs   Lack of Transportation (Medical): Yes   Lack of Transportation (Non-Medical): Yes  Physical Activity: Not on file  Stress: Not on file  Social Connections: Not on file  Intimate Partner  Violence: Not on file     No Known Allergies   Outpatient Medications Prior to Visit  Medication Sig Dispense Refill   carvedilol (COREG) 3.125 MG tablet Take 1 tablet (3.125 mg total) by mouth 2 (two) times daily with a meal. 60 tablet 0   Cholecalciferol (VITAMIN D3) 2000 UNITS capsule Take 2,000 Units by mouth daily.     dapagliflozin propanediol (FARXIGA) 10 MG TABS tablet Take 1 tablet (10 mg total) by mouth daily before breakfast. 30 tablet 6   ELIQUIS 5 MG TABS tablet TAKE 1 TABLET BY MOUTH TWICE A DAY 60 tablet 6   finasteride (PROSCAR) 5 MG tablet TAKE 1 TABLET BY MOUTH EVERY DAY 90 tablet 3   furosemide (LASIX) 20 MG tablet Take 1 tablet (20 mg total) by mouth daily. 30 tablet 6   furosemide (LASIX) 20 MG tablet Take 1 tablet (20 mg total) by mouth daily. 90 tablet 3   macitentan (OPSUMIT) 10 MG tablet Take 1 tablet (10 mg total) by mouth daily. 30 tablet 11   polyethylene glycol powder (GLYCOLAX/MIRALAX) powder Take 17 g by mouth daily as needed. For constipation - mix with 8 oz liquid and drink     potassium chloride SA (KLOR-CON M) 10 MEQ tablet Take 1 tablet (10 mEq total) by mouth daily. 90 tablet 3   pravastatin (PRAVACHOL) 40 MG tablet Take 40 mg by mouth daily.     Riociguat (ADEMPAS) 2 MG TABS Take 2 mg by mouth 3 (three) times daily.     sacubitril-valsartan (ENTRESTO) 49-51 MG Take 1 tablet by mouth 2 (two) times daily. 60 tablet 6   spironolactone (ALDACTONE) 25 MG tablet Take 1 tablet (25 mg total) by mouth daily. 90 tablet 3   No facility-administered medications prior to visit.       Objective:   Physical Exam:  General appearance: 65 y.o., male, NAD, conversant  Eyes: anicteric sclerae, moist conjunctivae; no lid-lag; PERRL, tracking appropriately HENT: NCAT; oropharynx, MMM, no mucosal ulcerations; normal hard and soft palate Neck: Trachea midline; no lymphadenopathy, no JVD Lungs: CTAB, no crackles, no wheeze, with normal respiratory effort CV: RRR, no  MRGs  Abdomen: Soft, non-tender; non-distended, BS present  Extremities: No peripheral edema, radial and DP pulses present bilaterally  Skin: Normal temperature, turgor and texture; no rash Psych: Appropriate affect Neuro: Alert and oriented to person and place, no focal deficit    There were no vitals filed for this visit.   on *** LPM *** RA BMI Readings from Last 3 Encounters:  10/21/21 22.78 kg/m  09/18/21 22.84 kg/m  09/04/21 27.04 kg/m   Wt Readings from Last 3 Encounters:  10/21/21 228 lb 9.6 oz (103.7 kg)  09/18/21 229 lb 3.2 oz (104 kg)  09/04/21 228 lb (  103.4 kg)     CBC    Component Value Date/Time   WBC 5.3 06/04/2021 0345   RBC 3.99 (L) 06/04/2021 0345   HGB 11.8 (L) 06/04/2021 0345   HCT 36.2 (L) 06/04/2021 0345   PLT 143 (L) 06/04/2021 0345   MCV 90.7 06/04/2021 0345   MCH 29.6 06/04/2021 0345   MCHC 32.6 06/04/2021 0345   RDW 15.1 06/04/2021 0345   LYMPHSABS 1.0 05/28/2021 1350   MONOABS 0.5 05/28/2021 1350   EOSABS 0.1 05/28/2021 1350   BASOSABS 0.0 05/28/2021 1350    ***  Chest Imaging: CT Chest with contrast 05/28/21 with many areas of aneurysmal dilation of segmental and SS branches of PA as large as 1.8cm RML dominant nodules in RUL (new 2.5cm), RLL  (smaller 2.5cm now), medistinal LAD  Pulmonary Functions Testing Results: No flowsheet data found.  Echocardiogram:   07/23/21:  1. Left ventricular ejection fraction, by estimation, is 40 to 45%. The  left ventricle has low normal function. The left ventricle has no regional  wall motion abnormalities. Left ventricular diastolic parameters are  indeterminate.   2. Right ventricular systolic function is moderately reduced. The right  ventricular size is severely enlarged. There is severely elevated  pulmonary artery systolic pressure.   3. Right atrial size was severely dilated.   4. Moderate pericardial effusion. The pericardial effusion is  circumferential. There is no evidence of cardiac  tamponade.   5. The mitral valve is normal in structure. No evidence of mitral valve  regurgitation. No evidence of mitral stenosis.   6. The tricuspid valve is abnormal. Tricuspid valve regurgitation is  severe.   7. The aortic valve is tricuspid. Aortic valve regurgitation is not  visualized. No aortic stenosis is present.   8. The inferior vena cava is dilated in size with <50% respiratory  variability, suggesting right atrial pressure of 15 mmHg.   Heart Catheterization:   06/03/21 RHC: RHC Procedural Findings: Hemodynamics (mmHg) RA mean 7 RV 52/4 PA 60/13, mean 29 PCWP mean 8 LV 97/5 AO 90/48  Oxygen saturations: PA 68% AO 100%  Cardiac Output (Fick) 6  Cardiac Index (Fick) 2.53 PVR 3.5 WU    Assessment & Plan:    Plan:      Maryjane Hurter, MD [facility] 10/24/2021 4:45 PM

## 2021-10-26 ENCOUNTER — Institutional Professional Consult (permissible substitution): Payer: Medicare HMO | Admitting: Student

## 2021-10-27 ENCOUNTER — Other Ambulatory Visit: Payer: Self-pay | Admitting: Cardiology

## 2021-10-28 NOTE — Progress Notes (Signed)
Remote ICD transmission.   

## 2021-10-29 NOTE — Telephone Encounter (Signed)
Received message from [place] that patient's order for Opsumit is currently in process.   Audry Riles, PharmD, BCPS, BCCP, CPP Heart Failure Clinic Pharmacist [phone]

## 2021-10-30 ENCOUNTER — Other Ambulatory Visit: Payer: Self-pay | Admitting: Cardiology

## 2021-10-31 LAB — BASIC METABOLIC PANEL
BUN/Creatinine Ratio: 19 (ref 10–24)
BUN: 25 mg/dL (ref 8–27)
CO2: 21 mmol/L (ref 20–29)
Calcium: 9.3 mg/dL (ref 8.6–10.2)
Chloride: 102 mmol/L (ref 96–106)
Creatinine, Ser: 1.31 mg/dL — ABNORMAL HIGH (ref 0.76–1.27)
Glucose: 95 mg/dL (ref 70–99)
Potassium: 4.3 mmol/L (ref 3.5–5.2)
Sodium: 137 mmol/L (ref 134–144)
eGFR: 61 mL/min/{1.73_m2} (ref >59)

## 2021-10-31 LAB — SPECIMEN STATUS REPORT

## 2021-11-02 ENCOUNTER — Other Ambulatory Visit (HOSPITAL_COMMUNITY): Payer: Self-pay

## 2021-11-17 ENCOUNTER — Telehealth (HOSPITAL_COMMUNITY): Payer: Self-pay | Admitting: Pharmacist

## 2021-11-17 NOTE — Telephone Encounter (Signed)
Attempted to submit PA's for Adempas and Opsumit. Per CMM, The patient currently has access to the requested medications and a Prior Authorizations are not needed for the patient/medication.  Audry Riles, PharmD, BCPS, BCCP, CPP Heart Failure Clinic Pharmacist [phone]

## 2021-11-19 ENCOUNTER — Ambulatory Visit (INDEPENDENT_AMBULATORY_CARE_PROVIDER_SITE_OTHER): Payer: Medicare HMO

## 2021-11-19 DIAGNOSIS — I428 Other cardiomyopathies: Secondary | ICD-10-CM

## 2021-11-20 LAB — CUP PACEART REMOTE DEVICE CHECK
Battery Voltage: 2.63 V
Brady Statistic RV Percent Paced: 0.25 %
Date Time Interrogation Session: 20230105202605
HighPow Impedance: 304 Ohm
HighPow Impedance: 45 Ohm
Implantable Lead Implant Date: 20130529
Implantable Lead Location: 753860
Implantable Lead Model: 6935
Implantable Pulse Generator Implant Date: 20130529
Lead Channel Impedance Value: 361 Ohm
Lead Channel Pacing Threshold Amplitude: 0.875 V
Lead Channel Pacing Threshold Pulse Width: 0.4 ms
Lead Channel Sensing Intrinsic Amplitude: 3.875 mV
Lead Channel Sensing Intrinsic Amplitude: 3.875 mV
Lead Channel Setting Pacing Amplitude: 2.5 V
Lead Channel Setting Pacing Pulse Width: 0.4 ms
Lead Channel Setting Sensing Sensitivity: 0.3 mV

## 2021-11-24 ENCOUNTER — Telehealth: Payer: Self-pay

## 2021-11-24 ENCOUNTER — Ambulatory Visit (INDEPENDENT_AMBULATORY_CARE_PROVIDER_SITE_OTHER): Payer: Medicare HMO | Admitting: *Deleted

## 2021-11-24 ENCOUNTER — Telehealth: Payer: Self-pay | Admitting: *Deleted

## 2021-11-24 VITALS — BP 90/50 | HR 140 | Resp 32 | Ht 77.0 in | Wt 229.0 lb

## 2021-11-24 DIAGNOSIS — I4891 Unspecified atrial fibrillation: Secondary | ICD-10-CM

## 2021-11-24 MED ORDER — METOPROLOL SUCCINATE ER 25 MG PO TB24: 25.0000 mg | ORAL_TABLET | Freq: Every day | ORAL | 6 refills | Status: DC

## 2021-11-24 MED ORDER — ENTRESTO 24-26 MG PO TABS: 1.0000 | ORAL_TABLET | Freq: Two times a day (BID) | ORAL | 6 refills | Status: DC

## 2021-11-24 NOTE — Progress Notes (Signed)
EKG done and routed to [facility] done and entered Medications reviewed Reports SOB and currently on 3L O2 via [place] since recent hospital discharge Denies dizziness, chest pain or worsening SOB.  Alert and oriented x's 3 Answered all questions appropriately

## 2021-11-24 NOTE — Telephone Encounter (Signed)
Patient informed and verbalized understanding of plan. Will come to office today at 1:00 pm for EKG

## 2021-11-24 NOTE — Telephone Encounter (Signed)
Contacted to review medication changes from nurse visit today as suggested using teach back. Verbalized understanding of medication changes

## 2021-11-24 NOTE — Telephone Encounter (Signed)
MDT alert RV Lead Integrity warning: - 2 or more High Rate-NS episodes < 220 ms. - Sensing Integrity Counter >= 30 in 3 days. Presenting rhythm irregular R-R, rate 170's to >200 Hx of AF, Eliquis, Coreg prescribed  Battery 2.63V (RRT 2.63V no trigger) Optivol crossed threshold 9/14 and is ongoing, trending down Route to triage LA  Successful telephone encounter to patient to follow up on presenting rhythm and RRT status based on Voltage. Patient states he feels fine and has no symptoms of AF with RVR. Feels it is related to his oxygen "coming off in the middle of the night". Transmission time is 0858 am. Multiple unsuccessful attempt to assist patient with sending manual transmission. Reviewed medications. Patient is compliant with Coreg, Eliquis, lasix, aldactone, and entresto. Patient is extremely reluctant to schedule appointment secondary to transportation and being asymptomatic. He is agreeable to be seen in [place] clinic if absolutely necessary. Contact scheduling who will have [facility] contact patient for possible appointment. Will route to Dr. Schuyler Amor, RN as Juluis Rainier and RRT status. RV lead integrity reset via carelink.

## 2021-11-24 NOTE — Progress Notes (Signed)
Patient seen for nurse encounter in [place] office with high heart rates noted on device interrogation.  He has known, permanent atrial fibrillation that has generally been well controlled on low-dose Coreg per chart review.  Last seen in the heart failure clinic by Dr. Aundra Dubin in December 2022.  Also just recently admitted to [facility] with shortness of breath.  Reportedly, LVEF 50 to 55% range during that encounter with known severe pulmonary hypertension.  Medications have not been changed per review.  Patient in rapid atrial fibrillation today although not overly symptomatic.  Blood pressure 90/50 (similar range when seen in the heart failure clinic in December 2022).  My suggestion is to decrease Entresto to 24/26 mg twice daily to allow more blood pressure room, change from Coreg to Toprol-XL 25 mg daily in effort to improve heart rate control with less effect on blood pressure.  Keep follow-up as scheduled.

## 2021-11-24 NOTE — Progress Notes (Signed)
Medication changes and plan discussed with patient in detail. Advised that he will be contacted by phone around 2:30 pm today to confirm medication changes with bottles in hand. Verbalized understanding

## 2021-11-24 NOTE — Patient Instructions (Signed)
Medication Instructions:  Your physician has recommended you make the following change in your medication:  Decrease entresto to 24/26 mg twice daily Stop carvedilol Start metoprolol succinate 25 mg daily Continue other medications the same  Labwork: none  Testing/Procedures: none  Follow-Up: Your physician recommends that you schedule a follow-up appointment in: as planned  Any Other Special Instructions Will Be Listed Below (If Applicable).  If you need a refill on your cardiac medications before your next appointment, please call your pharmacy.

## 2021-11-25 ENCOUNTER — Telehealth: Payer: Self-pay

## 2021-11-25 NOTE — Telephone Encounter (Signed)
Multiple collaborations with [employer] rep regarding RV lead integrity alert. Since patient is near RRT and will require gen change, now suggest patient have in clinic check to confirm alert was related to short R-R from PVCs/RVR although trigger appears to be secondary to 2 or more high rate alerts< 274ms within 40 days and short V-V intervals. Unsuccessful telephone encounter to patient to schedule device check with isometrics. Hipaa compliant VM message left requesting call back. Industry will be notified once appointment is scheduled.

## 2021-11-25 NOTE — Telephone Encounter (Signed)
Pt has not reached RRT on his device yet.  Pt is scheduled for monthly remote checks.  Await further device needs.

## 2021-11-26 ENCOUNTER — Telehealth: Payer: Self-pay

## 2021-11-26 DIAGNOSIS — I428 Other cardiomyopathies: Secondary | ICD-10-CM

## 2021-11-26 NOTE — Telephone Encounter (Signed)
Erroneous encounter

## 2021-11-26 NOTE — Telephone Encounter (Signed)
Reviewed most recent RV lead alert with Industry rep who feels RV lead integrity is intact however since patient has a visit with general cardiology 12/01/21 and industry agreeable test device during that visit. Successful telephone encounter to patient to inform that he does not have to come to [place] office for device testing. Confirmed patient appointment with Dr. Domenic Polite in [place] 12/01/21 at 8:20. Industry will meet patient at that visit just to perform isometric testing to insure RV lead is intact with no noise.

## 2021-11-30 NOTE — Progress Notes (Signed)
Cardiology Office Note  Date: 12/01/2021   ID: [name] [name], DOB [birth year], MRN [id number]  PCP:  [name] Chroman, MD  Cardiologist:  [name] Lesches, MD Electrophysiologist:  [name] Grayer, MD   Chief Complaint  Patient presents with   Cardiac follow-up    History of Present Illness: [name] [name] is a 66 y.o. male last seen in October 2022.  He is here for a follow-up visit. He had a recent nurse visit with documented rapid atrial fibrillation and medications were adjusted at that time.  He presents with stable NYHA class III dyspnea, on supplemental oxygen.  He tells me that he is not eating as much, still with good diuresis on present regimen.  His weight is down about 9 pounds and his blood pressure remains low with systolics in the 27O to 35K.  Heart rate however is better controlled following conversion from Coreg to Toprol-XL.  We had also reduced his Entresto.  He had follow-up with Dr. Aundra [name] in the heart failure clinic back in December 2022, I reviewed the note.  He has a Medtronic ICD in place with follow-up by Dr. Rayann [name].  He is to have a device interrogation today to evaluate RV lead integrity.  He does not report any device shocks or syncope.  I personally reviewed his ECG today which shows atrial fibrillation at 100 bpm.  Today we went over his medications and discussed further adjustments, mainly down titration of diuretics with follow-up BMET.  Past Medical History:  Diagnosis Date   Anemia    Atrial fibrillation ([place])    AVM (arteriovenous malformation)    Duodenum - nonbleeding and EGD 11/13   Cardiac arrest ([place]) 1998   ICD implanted at [facility]   Cardiomyopathy, nonischemic ([place])    LVEF 09-38%   Chronic systolic heart failure ([place])    Essential hypertension    History of colonic polyps    Colonoscopy 11/13   Pneumonia    Severe with respiratory failure 10/13    Past Surgical History:  Procedure Laterality Date   [place]   COLONOSCOPY  09/26/2012   Procedure: COLONOSCOPY;  Surgeon: [name] Beams, MD;  Location: [place];  Service: Endoscopy;  Laterality: N/A;   Defibrillator system revision  04/12/12   MDT Protecta XT VR implanted at [facility] by Dr Deno Etienne with previously implanted system and leads extracted due to RV lead failure   ESOPHAGOGASTRODUODENOSCOPY  09/25/2012   Procedure: ESOPHAGOGASTRODUODENOSCOPY (EGD);  Surgeon: [name] Beams, MD;  Location: [facility] ENDOSCOPY;  Service: Endoscopy;  Laterality: N/A;   RIGHT/LEFT HEART CATH AND CORONARY ANGIOGRAPHY N/A 06/03/2021   Procedure: RIGHT/LEFT HEART CATH AND CORONARY ANGIOGRAPHY;  Surgeon: [name] Dresser, MD;  Location: [facility] LAB;  Service: Cardiovascular;  Laterality: N/A;   TEE WITHOUT CARDIOVERSION N/A 06/02/2021   Procedure: TRANSESOPHAGEAL ECHOCARDIOGRAM (TEE);  Surgeon: [name] Perla, MD;  Location: [facility] ENDOSCOPY;  Service: Cardiovascular;  Laterality: N/A;    Current Outpatient Medications  Medication Sig Dispense Refill   Cholecalciferol (VITAMIN D3) 2000 UNITS capsule Take 2,000 Units by mouth daily.     ELIQUIS 5 MG TABS tablet TAKE 1 TABLET BY MOUTH TWICE A DAY 60 tablet 6   FARXIGA 10 MG TABS tablet TAKE 1 TABLET BY MOUTH DAILY BEFORE BREAKFAST*STOP DIGOXIN* 30 tablet 6   finasteride (PROSCAR) 5 MG tablet TAKE 1 TABLET BY MOUTH EVERY DAY 90 tablet 3   macitentan (OPSUMIT) 10 MG tablet Take 1 tablet (  10 mg total) by mouth daily. 30 tablet 11   metoprolol succinate (TOPROL XL) 25 MG 24 hr tablet Take 1 tablet (25 mg total) by mouth daily. 30 tablet 6   polyethylene glycol powder (GLYCOLAX/MIRALAX) powder Take 17 g by mouth daily as needed. For constipation - mix with 8 oz liquid and drink     pravastatin (PRAVACHOL) 40 MG tablet Take 40 mg by mouth daily.     Riociguat (ADEMPAS) 2 MG TABS Take 2 mg by mouth 3 (three) times daily.     sacubitril-valsartan (ENTRESTO) 24-26 MG Take 1 tablet by mouth 2 (two) times daily. 60 tablet 6    furosemide (LASIX) 20 MG tablet Take 1 tablet (20 mg total) by mouth every other day. 45 tablet 2   potassium chloride (KLOR-CON M) 10 MEQ tablet Take 1 tablet (10 mEq total) by mouth every other day. 45 tablet 2   spironolactone (ALDACTONE) 25 MG tablet Take 0.5 tablets (12.5 mg total) by mouth daily. 45 tablet 2   No current facility-administered medications for this visit.   Allergies:  Patient has no known allergies.   ROS: No syncope.  Physical Exam: VS:  BP (!) 86/50    Pulse 100    Ht 6\' 5"  (1.956 m)    Wt 220 lb 6.4 oz (100 kg)    SpO2 98% Comment: on 3L O2 via [place]   BMI 26.14 kg/m , BMI Body mass index is 26.14 kg/m.  Wt Readings from Last 3 Encounters:  12/01/21 220 lb 6.4 oz (100 kg)  11/24/21 229 lb (103.9 kg)  10/21/21 228 lb 9.6 oz (103.7 kg)    General: Patient in no distress, wearing oxygen via nasal cannula. HEENT: Conjunctiva and lids normal, wearing a mask. Neck: Supple, no elevated JVP or carotid bruits, no thyromegaly. Lungs: Scattered rhonchi, nonlabored breathing at rest. Cardiac: Irregularly irregular, no S3, 1/6 systolic murmur, no pericardial rub. Extremities: No pitting edema.  ECG:  An ECG dated 11/24/2021 was personally reviewed today and demonstrated:  Atrial fibrillation with RVR.  Recent Labwork: 05/29/2021: TSH 4.689 05/31/2021: ALT 11; AST 15 06/04/2021: Hemoglobin 11.8; Platelets 143 10/21/2021: B Natriuretic Peptide 253.8 10/30/2021: BUN 25; Creatinine, Ser 1.31; Potassium 4.3; Sodium 137   Other Studies Reviewed Today:  Echocardiogram 07/23/2021:  1. Left ventricular ejection fraction, by estimation, is 40 to 45%. The  left ventricle has low normal function. The left ventricle has no regional  wall motion abnormalities. Left ventricular diastolic parameters are  indeterminate.   2. Right ventricular systolic function is moderately reduced. The right  ventricular size is severely enlarged. There is severely elevated  pulmonary artery  systolic pressure.   3. Right atrial size was severely dilated.   4. Moderate pericardial effusion. The pericardial effusion is  circumferential. There is no evidence of cardiac tamponade.   5. The mitral valve is normal in structure. No evidence of mitral valve  regurgitation. No evidence of mitral stenosis.   6. The tricuspid valve is abnormal. Tricuspid valve regurgitation is  severe.   7. The aortic valve is tricuspid. Aortic valve regurgitation is not  visualized. No aortic stenosis is present.   8. The inferior vena cava is dilated in size with <50% respiratory  variability, suggesting right atrial pressure of 15 mmHg.   Assessment and Plan:  1.  HFrEF with nonischemic cardiomyopathy and also significant RV dysfunction with moderate to severe pulmonary hypertension.  Weight is down and systolic blood pressure remains low.  We already  switched him from Coreg to Toprol-XL now with better control of atrial fibrillation rate, reduced Entresto to 24/26 mg twice daily.  Plan to decrease Aldactone to 12.5 mg daily and have him take Lasix with potassium supplement every other day (may need to go to as needed).  Check BMET.  Does not appear fluid overloaded at this time.  Current regimen now includes Toprol-XL, Entresto, Aldactone, Farxiga, and Lasix with potassium supplement.  2.  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  He continues on riociguat with follow-up per Dr. Aundra [name].  3.  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 Aundra [name], prior work-up negative for mycotic aneurysms.  4.  Permanent atrial fibrillation with CHA2DS2-VASc score of 2.  Heart rate control better following switch from Coreg to Toprol-XL.  Continue Eliquis for stroke prophylaxis.  5.  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  Continue  Eliquis.  6.  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 Rayann [name].  Device to be reinterrogated today to assess RV lead integrity per EP.  Medication Adjustments/Labs and Tests Ordered: Current medicines are reviewed at length with the patient today.  Concerns regarding medicines are outlined above.   Tests Ordered: Orders Placed This Encounter  Procedures   Basic metabolic panel   EKG 93-ATFT    Medication Changes: Meds ordered this encounter  Medications   spironolactone (ALDACTONE) 25 MG tablet    Sig: Take 0.5 tablets (12.5 mg total) by mouth daily.    Dispense:  45 tablet    Refill:  2    12/01/2021 dose decrease   potassium chloride (KLOR-CON M) 10 MEQ tablet    Sig: Take 1 tablet (10 mEq total) by mouth every other day.    Dispense:  45 tablet    Refill:  2    12/01/2021 change in directions   furosemide (LASIX) 20 MG tablet    Sig: Take 1 tablet (20 mg total) by mouth every other day.    Dispense:  45 tablet    Refill:  2    12/01/2021 change in directions    Disposition:  Follow up  1 month.  Signed, Satira Sark, MD, [facility] 12/01/2021 8:51 AM    [place] [facility] [place], [place], [place] [address] Phone: [phone]; Fax: [phone]

## 2021-11-30 NOTE — Addendum Note (Signed)
Addended by: Cheri Kearns A on: 11/30/2021 01:15 PM   Modules accepted: Level of Service

## 2021-11-30 NOTE — Progress Notes (Signed)
Remote ICD transmission.   

## 2021-12-01 ENCOUNTER — Other Ambulatory Visit: Payer: Self-pay | Admitting: Cardiology

## 2021-12-01 ENCOUNTER — Ambulatory Visit (INDEPENDENT_AMBULATORY_CARE_PROVIDER_SITE_OTHER): Payer: Medicare HMO | Admitting: Cardiology

## 2021-12-01 ENCOUNTER — Encounter: Payer: Self-pay | Admitting: Cardiology

## 2021-12-01 VITALS — BP 86/50 | HR 100 | Ht 77.0 in | Wt 220.4 lb

## 2021-12-01 DIAGNOSIS — I4821 Permanent atrial fibrillation: Secondary | ICD-10-CM | POA: Diagnosis not present

## 2021-12-01 DIAGNOSIS — I5020 Unspecified systolic (congestive) heart failure: Secondary | ICD-10-CM | POA: Diagnosis not present

## 2021-12-01 DIAGNOSIS — Z79899 Other long term (current) drug therapy: Secondary | ICD-10-CM

## 2021-12-01 DIAGNOSIS — I272 Pulmonary hypertension, unspecified: Secondary | ICD-10-CM | POA: Diagnosis not present

## 2021-12-01 MED ORDER — SPIRONOLACTONE 25 MG PO TABS: 12.5000 mg | ORAL_TABLET | Freq: Every day | ORAL | 2 refills | Status: DC

## 2021-12-01 MED ORDER — FUROSEMIDE 20 MG PO TABS: 20.0000 mg | ORAL_TABLET | ORAL | 2 refills | Status: DC

## 2021-12-01 MED ORDER — POTASSIUM CHLORIDE CRYS ER 10 MEQ PO TBCR: 10.0000 meq | EXTENDED_RELEASE_TABLET | ORAL | 2 refills | Status: DC

## 2021-12-01 NOTE — Patient Instructions (Addendum)
Medication Instructions:  Your physician has recommended you make the following change in your medication:  Decrease spironolactone to 12.5 mg daily Change potassium 10 meq to one every other day Change furosemide 20 mg to one every other day Continue other medications the same  Labwork: BMET today at [facility]  Testing/Procedures: none  Follow-Up: Your physician recommends that you schedule a follow-up appointment in: 1 month  Any Other Special Instructions Will Be Listed Below (If Applicable). Please call [phone] to set up your transportation to any appointment needed  If you need a refill on your cardiac medications before your next appointment, please call your pharmacy.

## 2021-12-02 ENCOUNTER — Telehealth: Payer: Self-pay | Admitting: *Deleted

## 2021-12-02 ENCOUNTER — Institutional Professional Consult (permissible substitution): Payer: Medicare HMO | Admitting: Internal Medicine

## 2021-12-02 NOTE — Telephone Encounter (Signed)
Patient informed. Copy sent to PCP °

## 2021-12-02 NOTE — Telephone Encounter (Signed)
-----   Message from Satira Sark, MD sent at 12/01/2021  1:19 PM EST ----- Results reviewed.  Medication adjustments made at visit today, continue with current plan.

## 2021-12-10 ENCOUNTER — Telehealth: Payer: Self-pay | Admitting: Cardiology

## 2021-12-10 NOTE — Telephone Encounter (Signed)
° °  Pt is requesting to speak with Dr. Myles Gip nurse, he said, it regarding his meds, asked for the name of the meds, and he said he doesn't have it right now and will tell it to the nurse when he gets a call back

## 2021-12-10 NOTE — Telephone Encounter (Signed)
Contacted patient to answer questions about his medications Wanting to know if his liver function was checked with his last lab work. Advised it was kidney functioning and his liver was checked on 11/19/2021. Wanting to know if he needed to follow up with Dr. Aundra Dubin. Advised that he did have to follow with [facility] for heart failure and medication management. Verbalized understanding.

## 2021-12-16 ENCOUNTER — Other Ambulatory Visit: Payer: Self-pay | Admitting: *Deleted

## 2021-12-16 MED ORDER — ENTRESTO 24-26 MG PO TABS: 1.0000 | ORAL_TABLET | Freq: Two times a day (BID) | ORAL | 6 refills | Status: AC

## 2021-12-21 ENCOUNTER — Ambulatory Visit (INDEPENDENT_AMBULATORY_CARE_PROVIDER_SITE_OTHER): Payer: Medicare HMO

## 2021-12-21 DIAGNOSIS — I428 Other cardiomyopathies: Secondary | ICD-10-CM | POA: Diagnosis not present

## 2021-12-21 LAB — CUP PACEART REMOTE DEVICE CHECK
Battery Voltage: 2.63 V
Brady Statistic RV Percent Paced: 0.36 %
Date Time Interrogation Session: 20230206022605
HighPow Impedance: 285 Ohm
HighPow Impedance: 39 Ohm
Implantable Lead Implant Date: 20130529
Implantable Lead Location: 753860
Implantable Lead Model: 6935
Implantable Pulse Generator Implant Date: 20130529
Lead Channel Impedance Value: 361 Ohm
Lead Channel Pacing Threshold Amplitude: 1.125 V
Lead Channel Pacing Threshold Pulse Width: 0.4 ms
Lead Channel Sensing Intrinsic Amplitude: 3.5 mV
Lead Channel Sensing Intrinsic Amplitude: 3.5 mV
Lead Channel Setting Pacing Amplitude: 2.5 V
Lead Channel Setting Pacing Pulse Width: 0.4 ms
Lead Channel Setting Sensing Sensitivity: 0.3 mV

## 2021-12-23 NOTE — Progress Notes (Signed)
Remote ICD transmission.   

## 2021-12-25 ENCOUNTER — Ambulatory Visit (INDEPENDENT_AMBULATORY_CARE_PROVIDER_SITE_OTHER): Payer: Medicare HMO

## 2021-12-25 ENCOUNTER — Ambulatory Visit: Payer: Medicare HMO | Admitting: Internal Medicine

## 2021-12-25 ENCOUNTER — Other Ambulatory Visit: Payer: Self-pay

## 2021-12-25 ENCOUNTER — Encounter: Payer: Self-pay | Admitting: Internal Medicine

## 2021-12-25 VITALS — BP 116/80 | HR 85 | Temp 98.3°F | Ht 76.0 in | Wt 223.0 lb

## 2021-12-25 DIAGNOSIS — R918 Other nonspecific abnormal finding of lung field: Secondary | ICD-10-CM

## 2021-12-25 DIAGNOSIS — J9611 Chronic respiratory failure with hypoxia: Secondary | ICD-10-CM | POA: Diagnosis not present

## 2021-12-25 DIAGNOSIS — I272 Pulmonary hypertension, unspecified: Secondary | ICD-10-CM | POA: Diagnosis not present

## 2021-12-25 NOTE — Assessment & Plan Note (Signed)
Started on 02 05/2021 - 12/25/2021   Walked on RA x  2  lap(s) =  approx 500  ft  @ moderate pace, stopped due to desats s sob  with lowest 02 sats 88%     As of 12/25/2021 advised 3lpm hs and maintain sats > 90% with more than in house walking  >>> needs best fit for portable 02    Each maintenance medication was reviewed in detail including emphasizing most importantly the difference between maintenance and prns and under what circumstances the prns are to be triggered using an action plan format where appropriate.  Total time for H and P, chart review, counseling,  directly observing portions of ambulatory 02 saturation study/ and generating customized AVS unique to this initial pulmonary post hosp  office visit / same day charting > 40 min

## 2021-12-25 NOTE — Assessment & Plan Note (Addendum)
Moderate in pt with longstanding L sided chf complicated by PE and pulmonary aneurysms suggestive of mycotic process but all cultures and rheum w/u neg as of 05/2021   Already being treated by Dr Marigene Ehlers in [place] clinic with no cp or hemotysis to date and improving ex tol but still desaturates with prolonged walking (see separate a/p)   >> f/u in pulmonary clinic in 3 m, nothing other than approp 02 supplementation  to offer here (Dr Carlis Abbott concurred with this opinion  at completion of inpt w/u which was extremely extensive)

## 2021-12-25 NOTE — Patient Instructions (Signed)
Goal is to keep your 02 level above 90% at all times so continue to wear 02 at 3lpm at bedtime and as needed during the day  Make sure you check your oxygen saturation  AT  your highest level of activity (not after you stop)   to be sure it stays over 90% and adjust  02 flow upward to maintain this level if needed but remember to turn it back to previous settings when you stop (to conserve your supply).    Please schedule a follow up visit in 3 months but call sooner if needed -  [place]

## 2021-12-25 NOTE — Progress Notes (Addendum)
[name] [name], male    DOB: [birth year],    MRN: [id number]   Brief patient profile:  89 yobm quit smoking 1999 referred to pulmonary clinic 12/25/2021 by Dr Aundra Dubin  for pulmonary hypertension with last note 06/15/2021     history of anemia, permanent atrial fibrillation, cardiac arrest 1999, Medtronic  ICD, NICM, HTN.  He has a long history of cardiomyopathy.      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 PCCM consulted and felt like this could be caused by pulmonary septic emboli. He was also noted to have small segmental PEs.  Started on heparin drip and vanc/cefepime/metronidazone. Lower extremity dopplers negative for DVT. Transferred to [name] for further management. TEE showed no vegetation.  Echo this admission showed EF 35-40%, D-shaped interventricular septum, moderate RV enlargement, mild RV dysfunction, moderate pericardial effusion, severe TR, PASP 74 mmHg.  RHC/LHC showed normal coronaries, moderate PAH   History of Present Illness  12/25/2021  Pulmonary/ 1st office eval/[name] [name] maint on eliquis/ PH rx per Cards Chief Complaint  Patient presents with   Pulmonary Consult    Referred by Dr. Loralie Champagne for eval of pulmonary hypertension. Pt recently d/c from the hospital on o2 3lpm and has been using this mainly just at night.   Dyspnea:  feels like 02 slows him down / does not use it walking around house  Cough: none  Sleep: flat bed/ 2-3 pillows  SABA use: none 02  3lpm sitting still / 3lpm prn daytime  No obvious day to day or daytime variability or assoc excess/ purulent sputum or mucus plugs or hemoptysis or cp or chest tightness, subjective wheeze or overt sinus or hb symptoms.   Sleeping as above without nocturnal  or early am exacerbation  of respiratory  c/o's or need for noct saba. Also denies any obvious fluctuation of symptoms with weather or environmental changes or other aggravating or alleviating factors except as  outlined above   No unusual exposure hx or h/o childhood pna/ asthma or knowledge of premature birth.  Current Allergies, Complete Past Medical History, Past Surgical History, Family History, and Social History were reviewed in [employer] record.  ROS  The following are not active complaints unless bolded Hoarseness, sore throat, dysphagia, dental problems, itching, sneezing,  nasal congestion or discharge of excess mucus or purulent secretions, ear ache,   fever, chills, sweats, unintended wt loss or wt gain, classically pleuritic or exertional cp,  orthopnea pnd or arm/hand swelling  or leg swelling, presyncope, palpitations, abdominal pain, anorexia, nausea, vomiting, diarrhea  or change in bowel habits or change in bladder habits, change in stools or change in urine, dysuria, hematuria,  rash, arthralgias, visual complaints, headache, numbness, weakness or ataxia or problems with walking or coordination,  change in mood or  memory.           Past Medical History:  Diagnosis Date   Anemia    Atrial fibrillation ([place])    AVM (arteriovenous malformation)    Duodenum - nonbleeding and EGD 11/13   Cardiac arrest ([place]) 1998   ICD implanted at [facility]   Cardiomyopathy, nonischemic ([place])    LVEF 15-17%   Chronic systolic heart failure ([place])    Essential hypertension    History of colonic polyps    Colonoscopy 11/13   Pneumonia    Severe with respiratory failure 10/13    Outpatient Medications Prior to Visit  Medication Sig Dispense  Refill   Cholecalciferol (VITAMIN D3) 2000 UNITS capsule Take 2,000 Units by mouth daily.     ELIQUIS 5 MG TABS tablet TAKE 1 TABLET BY MOUTH TWICE A DAY 60 tablet 6   FARXIGA 10 MG TABS tablet TAKE 1 TABLET BY MOUTH DAILY BEFORE BREAKFAST*STOP DIGOXIN* 30 tablet 6   finasteride (PROSCAR) 5 MG tablet TAKE 1 TABLET BY MOUTH EVERY DAY 90 tablet 3   furosemide (LASIX) 20 MG tablet Take 1 tablet (20 mg total) by mouth every other day. 45  tablet 2   macitentan (OPSUMIT) 10 MG tablet Take 1 tablet (10 mg total) by mouth daily. 30 tablet 11   metoprolol succinate (TOPROL XL) 25 MG 24 hr tablet Take 1 tablet (25 mg total) by mouth daily. 30 tablet 6   polyethylene glycol powder (GLYCOLAX/MIRALAX) powder Take 17 g by mouth daily as needed. For constipation - mix with 8 oz liquid and drink     potassium chloride (KLOR-CON M) 10 MEQ tablet Take 1 tablet (10 mEq total) by mouth every other day. 45 tablet 2   pravastatin (PRAVACHOL) 40 MG tablet Take 40 mg by mouth daily.     Riociguat (ADEMPAS) 2 MG TABS Take 2 mg by mouth 3 (three) times daily.     sacubitril-valsartan (ENTRESTO) 24-26 MG Take 1 tablet by mouth 2 (two) times daily. 60 tablet 6   spironolactone (ALDACTONE) 25 MG tablet Take 0.5 tablets (12.5 mg total) by mouth daily. 45 tablet 2   No facility-administered medications prior to visit.     Objective:     BP 116/80 (BP Location: Left Arm, Cuff Size: Normal)    Pulse 85    Temp 98.3 F (36.8 C) (Oral)    Ht 6\' 4"  (1.93 m)    Wt 223 lb (101.2 kg)    SpO2 96% Comment: on RA   BMI 27.14 kg/m   SpO2: 96 % (on RA)  Amb bm nad    HEENT : pt wearing mask not removed for exam due to covid -19 concerns.    NECK :  without JVD/Nodes/TM/ nl carotid upstrokes bilaterally   LUNGS: no acc muscle use,  Nl contour chest which is clear to A and P bilaterally without cough on insp or exp maneuvers   CV:  slt irreg rhythm  no s3 or murmur or increase in P2, and no edema   ABD:  soft and nontender with nl inspiratory excursion in the supine position. No bruits or organomegaly appreciated, bowel sounds nl  MS:  Nl gait/ ext warm without deformities, calf tenderness, cyanosis or clubbing No obvious joint restrictions   SKIN: warm and dry without lesions    NEURO:  alert, approp, nl sensorium with  no motor or cerebellar deficits apparent.    CXR PA and Lateral:   12/25/2021 :    I personally reviewed images and agree with  radiology impression as follows:    New patchy perihilar opacities are noted which may represent edema or pneumonia, but masses or nodules cannot be excluded. CT scan is recommended for further evaluation.      Assessment   Pulmonary hypertension (HCC) Moderate in pt with longstanding L sided chf complicated by PE and pulmonary aneurysms suggestive of mycotic process but all cultures and rheum w/u neg as of 05/2021   Already being treated by Dr Marigene Ehlers in [place] clinic with no cp or hemotysis to date and improving ex tol but still desaturates with prolonged walking (see separate a/p)   >>  f/u in pulmonary clinic in 3 m, nothing other than approp 02 supplementation  to offer here (Dr Carlis Abbott concurred with this opinion  at completion of inpt w/u which was extremely extensive)       Chronic respiratory failure with hypoxia ([place]) Started on 02 05/2021 - 12/25/2021   Walked on RA x  2  lap(s) =  approx 500  ft  @ moderate pace, stopped due to desats s sob  with lowest 02 sats 88%   As of 12/25/2021 advised 3lpm hs and maintain sats > 90% with more than in house walking  >>> needs best fit for portable 02           Pulmonary infiltrates on CXR See cxr 12/25/2021  ? Old infarcts   Creat a bit high for CTa and already on full  anticoagulation / not acting septic so this could be infarcts that occurred before full anticoagulation > will hold off further imaging unless new clinical problem and rec f/u cxr with ov in 6 weeks    Each maintenance medication was reviewed in detail including emphasizing most importantly the difference between maintenance and prns and under what circumstances the prns are to be triggered using an action plan format where appropriate.  Total time for H and P, chart review, counseling,  directly observing portions of ambulatory 02 saturation study/ and generating customized AVS unique to this initial pulmonary post hosp  office visit / same day charting > 40 min           Christinia Gully, MD 12/25/2021

## 2021-12-26 ENCOUNTER — Other Ambulatory Visit: Payer: Self-pay | Admitting: Urology

## 2021-12-28 NOTE — Progress Notes (Signed)
Cardiology Office Note  Date: 12/29/2021   ID: [name] [name], DOB 06/13/1956, MRN [id number]  PCP:  Glenda Chroman, MD  Cardiologist:  Rozann Lesches, MD Electrophysiologist:  Thompson Grayer, MD   Chief Complaint  Patient presents with   Cardiac follow-up    History of Present Illness: [name] [name] is a 66 y.o. male last seen in January.  He is here for a routine visit.  Weight has been stable, no dizziness, he continues on supplemental oxygen via nasal cannula with NYHA class II-III dyspnea.  No sense of palpitations.  At last visit Aldactone was decreased to 12.5 mg daily and Lasix with potassium supplement was reduced to every other day.  We reviewed the remainder of his medications and discussed up titration of Toprol-XL mainly for better control of atrial fibrillation.  He has not had any spontaneous bleeding problems on Eliquis.  He has a Medtronic ICD in place, followed by Dr. Rayann Heman.  Last device interrogation reviewed from early February.  Past Medical History:  Diagnosis Date   Anemia    Atrial fibrillation ([place])    AVM (arteriovenous malformation)    Duodenum - nonbleeding and EGD 11/13   Cardiac arrest ([place]) 1998   ICD implanted at [facility]   Cardiomyopathy, nonischemic ([place])    LVEF 34-91%   Chronic systolic heart failure ([place])    Essential hypertension    History of colonic polyps    Colonoscopy 11/13   Pneumonia    Severe with respiratory failure 10/13    Past Surgical History:  Procedure Laterality Date   [place]   COLONOSCOPY  09/26/2012   Procedure: COLONOSCOPY;  Surgeon: Beryle Beams, MD;  Location: [place];  Service: Endoscopy;  Laterality: N/A;   Defibrillator system revision  04/12/12   MDT Protecta XT VR implanted at [facility] by Dr Deno Etienne with previously implanted system and leads extracted due to RV lead failure   ESOPHAGOGASTRODUODENOSCOPY  09/25/2012   Procedure: ESOPHAGOGASTRODUODENOSCOPY (EGD);   Surgeon: Beryle Beams, MD;  Location: [facility] ENDOSCOPY;  Service: Endoscopy;  Laterality: N/A;   RIGHT/LEFT HEART CATH AND CORONARY ANGIOGRAPHY N/A 06/03/2021   Procedure: RIGHT/LEFT HEART CATH AND CORONARY ANGIOGRAPHY;  Surgeon: Larey Dresser, MD;  Location: [place] CV LAB;  Service: Cardiovascular;  Laterality: N/A;   TEE WITHOUT CARDIOVERSION N/A 06/02/2021   Procedure: TRANSESOPHAGEAL ECHOCARDIOGRAM (TEE);  Surgeon: Lelon Perla, MD;  Location: [facility] ENDOSCOPY;  Service: Cardiovascular;  Laterality: N/A;    Current Outpatient Medications  Medication Sig Dispense Refill   Cholecalciferol (VITAMIN D3) 2000 UNITS capsule Take 2,000 Units by mouth daily.     ELIQUIS 5 MG TABS tablet TAKE 1 TABLET BY MOUTH TWICE A DAY 60 tablet 6   FARXIGA 10 MG TABS tablet TAKE 1 TABLET BY MOUTH DAILY BEFORE BREAKFAST*STOP DIGOXIN* 30 tablet 6   finasteride (PROSCAR) 5 MG tablet TAKE 1 TABLET BY MOUTH EVERY DAY 90 tablet 3   furosemide (LASIX) 20 MG tablet Take 1 tablet (20 mg total) by mouth every other day. 45 tablet 2   macitentan (OPSUMIT) 10 MG tablet Take 1 tablet (10 mg total) by mouth daily. 30 tablet 11   metoprolol succinate (TOPROL XL) 50 MG 24 hr tablet Take 1 tablet (50 mg total) by mouth daily. Take with or immediately following a meal. 30 tablet 6   polyethylene glycol powder (GLYCOLAX/MIRALAX) powder Take 17 g by mouth daily as needed. For constipation - mix with 8  oz liquid and drink     potassium chloride (KLOR-CON M) 10 MEQ tablet Take 1 tablet (10 mEq total) by mouth every other day. 45 tablet 2   pravastatin (PRAVACHOL) 40 MG tablet Take 40 mg by mouth daily.     Riociguat (ADEMPAS) 2 MG TABS Take 2 mg by mouth 3 (three) times daily.     sacubitril-valsartan (ENTRESTO) 24-26 MG Take 1 tablet by mouth 2 (two) times daily. 60 tablet 6   spironolactone (ALDACTONE) 25 MG tablet Take 0.5 tablets (12.5 mg total) by mouth daily. 45 tablet 2   No current facility-administered medications for  this visit.   Allergies:  Patient has no known allergies.   ROS: Leg swelling improved.  No syncope.  Physical Exam: VS:  BP (!) 100/58    Pulse 92    Ht 6\' 4"  (1.93 m)    Wt 220 lb 9.6 oz (100.1 kg)    SpO2 96%    BMI 26.85 kg/m , BMI Body mass index is 26.85 kg/m.  Wt Readings from Last 3 Encounters:  12/29/21 220 lb 9.6 oz (100.1 kg)  12/25/21 223 lb (101.2 kg)  12/01/21 220 lb 6.4 oz (100 kg)    General: Patient appears comfortable at rest.  On supplemental oxygen via nasal cannula. HEENT: Conjunctiva and lids normal, wearing a mask. Neck: Supple, no elevated JVP or carotid bruits, no thyromegaly. Lungs: Clear to auscultation, nonlabored breathing at rest. Cardiac: Irregularly irregular, no S3, 1/6 systolic murmur, no pericardial rub. Extremities: No pitting edema.  ECG:  An ECG dated 12/01/2021 was personally reviewed today and demonstrated:  Atrial fibrillation.  Recent Labwork: 05/29/2021: TSH 4.689 05/31/2021: ALT 11; AST 15 06/04/2021: Hemoglobin 11.8; Platelets 143 10/21/2021: B Natriuretic Peptide 253.8 10/30/2021: BUN 25; Creatinine, Ser 1.31; Potassium 4.3; Sodium 137  January 2023: Potassium 4.1, BUN 24, creatinine 1.43  Other Studies Reviewed Today:  Echocardiogram 07/23/2021:  1. Left ventricular ejection fraction, by estimation, is 40 to 45%. The  left ventricle has low normal function. The left ventricle has no regional  wall motion abnormalities. Left ventricular diastolic parameters are  indeterminate.   2. Right ventricular systolic function is moderately reduced. The right  ventricular size is severely enlarged. There is severely elevated  pulmonary artery systolic pressure.   3. Right atrial size was severely dilated.   4. Moderate pericardial effusion. The pericardial effusion is  circumferential. There is no evidence of cardiac tamponade.   5. The mitral valve is normal in structure. No evidence of mitral valve  regurgitation. No evidence of mitral  stenosis.   6. The tricuspid valve is abnormal. Tricuspid valve regurgitation is  severe.   7. The aortic valve is tricuspid. Aortic valve regurgitation is not  visualized. No aortic stenosis is present.   8. The inferior vena cava is dilated in size with <50% respiratory  variability, suggesting right atrial pressure of 15 mmHg.   Assessment and Plan:  1.  HFrEF with nonischemic cardiomyopathy and RV dysfunction associated with moderate to severe pulmonary hypertension.  LVEF 40 to 45% range as of September 2022.  Weight is stable, medications have been adjusted over the last several weeks related to low blood pressure and also atrial fibrillation rate control.  Continue Toprol-XL, Entresto, Aldactone, Farxiga, and Lasix with potassium supplement.  Follow-up scheduled in the heart failure clinic.  2.  Permanent atrial fibrillation with CHA2DS2-VASc score of at least 2.  Tolerating transition from Coreg to Toprol-XL in terms of heart rate control,  plan to increase dose to 50 mg daily.  Continue Eliquis for stroke prophylaxis.  3.  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  He continues to follow with Dr. Aundra Dubin on riociguat.  4. 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 Aundra Dubin, prior work-up negative for mycotic aneurysms.  5.  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 Rayann Heman.  6.  CKD stage IIIb, recent creatinine 1.43.  Medication Adjustments/Labs and Tests Ordered: Current medicines are reviewed at length with the patient today.  Concerns regarding medicines are outlined above.   Tests Ordered: No orders of the defined types were placed in this encounter.   Medication Changes: Meds ordered this encounter  Medications   metoprolol succinate (TOPROL XL) 50 MG 24 hr tablet    Sig: Take 1 tablet (50 mg total) by mouth daily. Take with or immediately following a meal.    Dispense:  30 tablet     Refill:  6    12/29/2021 dose increase    Disposition:  Follow up  heart failure clinic and then here in 3 months.  Signed, Satira Sark, MD, [facility] 12/29/2021 9:31 AM    [facility] at [place], [place],  [address] Phone: [phone]; Fax: [phone]

## 2021-12-29 ENCOUNTER — Other Ambulatory Visit: Payer: Self-pay

## 2021-12-29 ENCOUNTER — Encounter: Payer: Self-pay | Admitting: Cardiology

## 2021-12-29 ENCOUNTER — Ambulatory Visit: Payer: Medicare HMO | Admitting: Cardiology

## 2021-12-29 VITALS — BP 100/58 | HR 92 | Ht 76.0 in | Wt 220.6 lb

## 2021-12-29 DIAGNOSIS — I4821 Permanent atrial fibrillation: Secondary | ICD-10-CM | POA: Diagnosis not present

## 2021-12-29 DIAGNOSIS — I5020 Unspecified systolic (congestive) heart failure: Secondary | ICD-10-CM | POA: Diagnosis not present

## 2021-12-29 DIAGNOSIS — N1832 Chronic kidney disease, stage 3b: Secondary | ICD-10-CM

## 2021-12-29 MED ORDER — METOPROLOL SUCCINATE ER 50 MG PO TB24: 50.0000 mg | ORAL_TABLET | Freq: Every day | ORAL | 6 refills | Status: DC

## 2021-12-29 NOTE — Patient Instructions (Signed)
Medication Instructions:  Your physician has recommended you make the following change in your medication:  Increase metoprolol succinate (toprol xl) to 50 mg daily (you may take 2 of your 25 mg tablets daily until they are finished) Continue other medications the same  Labwork: none  Testing/Procedures: none  Follow-Up: Your physician recommends that you schedule a follow-up appointment in: 3 months Your physician recommends that you schedule a follow-up appointment in: with Dr. Aundra Dubin in the [facility] soon.  Any Other Special Instructions Will Be Listed Below (If Applicable).  If you need a refill on your cardiac medications before your next appointment, please call your pharmacy.

## 2021-12-30 DIAGNOSIS — R918 Other nonspecific abnormal finding of lung field: Secondary | ICD-10-CM | POA: Insufficient documentation

## 2021-12-30 NOTE — Assessment & Plan Note (Signed)
See cxr 12/25/2021  ? Old infarcts   Creat a bit high for CTa and already on full  anticoagulation / not acting septic so this could be infarcts that occurred before full anticoagulation > will hold off further imaging unless new clinical problem and rec f/u cxr with ov in 6 weeks

## 2021-12-31 ENCOUNTER — Other Ambulatory Visit: Payer: Self-pay

## 2021-12-31 ENCOUNTER — Other Ambulatory Visit: Payer: Medicare HMO

## 2021-12-31 ENCOUNTER — Other Ambulatory Visit: Payer: Self-pay | Admitting: Internal Medicine

## 2021-12-31 DIAGNOSIS — R918 Other nonspecific abnormal finding of lung field: Secondary | ICD-10-CM

## 2021-12-31 DIAGNOSIS — R972 Elevated prostate specific antigen [PSA]: Secondary | ICD-10-CM

## 2021-12-31 NOTE — Progress Notes (Signed)
Pt aware of results and scheduled for cxr and ov in [place] per pt req.

## 2022-01-01 LAB — PSA, TOTAL AND FREE
PSA, Free Pct: 26.5 %
PSA, Free: 1.14 ng/mL
Prostate Specific Ag, Serum: 4.3 ng/mL — ABNORMAL HIGH (ref 0.0–4.0)

## 2022-01-07 ENCOUNTER — Ambulatory Visit: Payer: Medicare HMO | Admitting: Urology

## 2022-01-07 ENCOUNTER — Other Ambulatory Visit: Payer: Self-pay

## 2022-01-07 ENCOUNTER — Encounter: Payer: Self-pay | Admitting: Urology

## 2022-01-07 VITALS — BP 106/73 | HR 73 | Ht 77.0 in | Wt 220.0 lb

## 2022-01-07 DIAGNOSIS — R351 Nocturia: Secondary | ICD-10-CM

## 2022-01-07 DIAGNOSIS — R3915 Urgency of urination: Secondary | ICD-10-CM | POA: Diagnosis not present

## 2022-01-07 DIAGNOSIS — N401 Enlarged prostate with lower urinary tract symptoms: Secondary | ICD-10-CM

## 2022-01-07 DIAGNOSIS — R3129 Other microscopic hematuria: Secondary | ICD-10-CM

## 2022-01-07 DIAGNOSIS — N138 Other obstructive and reflux uropathy: Secondary | ICD-10-CM

## 2022-01-07 DIAGNOSIS — R972 Elevated prostate specific antigen [PSA]: Secondary | ICD-10-CM | POA: Diagnosis not present

## 2022-01-07 LAB — URINALYSIS, ROUTINE W REFLEX MICROSCOPIC
Bilirubin, UA: NEGATIVE
Ketones, UA: NEGATIVE
Leukocytes,UA: NEGATIVE
Nitrite, UA: NEGATIVE
Specific Gravity, UA: 1.015 (ref 1.005–1.030)
Urobilinogen, Ur: 1 mg/dL (ref 0.2–1.0)
pH, UA: 7 (ref 5.0–7.5)

## 2022-01-07 LAB — MICROSCOPIC EXAMINATION
Renal Epithel, UA: NONE SEEN /hpf
WBC, UA: NONE SEEN /hpf (ref 0–5)

## 2022-01-07 NOTE — Progress Notes (Signed)
Subjective:  1. BPH with urinary obstruction   2. Urgency of urination   3. Nocturia   4. Elevated PSA   5. Microhematuria   01/07/22: [name] returns today in f/u.  His PSA is back down to 4.3 with a 26% f/t ratio.  He remains on finasteride.  He is voiding well with an IPSS of 3.  His UA has 3-10 RBC's.  He is now on home O2.  He is now on Eliquis and off of warfarin.   07/09/21: [name] [name] returns today in f/u.   He had a PSA done on 03/12/21 and it was 6.2 with a 21% f/t ratio.  His is down from his level last year.  He remains on finasteride.   He had a CT hematuria study done that showed several non-GU findings including possible appendicitis for which he saw Dr. Arnoldo [name] and was not felt to require surgery and pulmonary lesions for which he was hospitalized and given antibiotics for possible septic emboli but an echo showed no vegetations.  He did have an enlarged prostate as well.  HIs UA has 3-10 RBC's today.    4/28/2: He returns today in f/u.  He remains on finasteride. He didn't get the PSA done.  He is voiding well with an IPSS of 5 with some urgency.     GU Hx; [name] [name] is a former patient of Dr. Exie [name] with a history of and elevated PSA and BPH with BOO. He is off of  tamsulosin but remains on finasteride. His PSA was 7.3 last year and is 7.2 prior to this visit.  It was  up to 8 in 2019  6.4.  He has had 3 negative biopsies in 2012, 2014 and 2016. He had cystoscopy 4-5 years ago with BPH and BOO with a large prostate. He is voiding well but is on furosemide which contributes to urgency and nocturia.   He has a good stream and empties well. He has no hematuria or dysuria. His IPSS is 10 today.   He is on warfarin but is having trouble maintaining a therapeutic level.        ROS:  ROS:  A complete review of systems was performed.  All systems are negative except for pertinent findings as noted.   ROS  No Known Allergies  Outpatient Encounter Medications as of  01/07/2022  Medication Sig   Cholecalciferol (VITAMIN D3) 2000 UNITS capsule Take 2,000 Units by mouth daily.   ELIQUIS 5 MG TABS tablet TAKE 1 TABLET BY MOUTH TWICE A DAY   FARXIGA 10 MG TABS tablet TAKE 1 TABLET BY MOUTH DAILY BEFORE BREAKFAST*STOP DIGOXIN*   finasteride (PROSCAR) 5 MG tablet TAKE 1 TABLET BY MOUTH EVERY DAY   furosemide (LASIX) 20 MG tablet Take 1 tablet (20 mg total) by mouth every other day.   macitentan (OPSUMIT) 10 MG tablet Take 1 tablet (10 mg total) by mouth daily.   metoprolol succinate (TOPROL XL) 50 MG 24 hr tablet Take 1 tablet (50 mg total) by mouth daily. Take with or immediately following a meal.   polyethylene glycol powder (GLYCOLAX/MIRALAX) powder Take 17 g by mouth daily as needed. For constipation - mix with 8 oz liquid and drink   potassium chloride (KLOR-CON M) 10 MEQ tablet Take 1 tablet (10 mEq total) by mouth every other day.   pravastatin (PRAVACHOL) 40 MG tablet Take 40 mg by mouth daily.   Riociguat (ADEMPAS) 2 MG TABS Take 2 mg by mouth 3 (  three) times daily.   sacubitril-valsartan (ENTRESTO) 24-26 MG Take 1 tablet by mouth 2 (two) times daily.   spironolactone (ALDACTONE) 25 MG tablet Take 0.5 tablets (12.5 mg total) by mouth daily.   No facility-administered encounter medications on file as of 01/07/2022.    Past Medical History:  Diagnosis Date   Anemia    Atrial fibrillation ([place])    AVM (arteriovenous malformation)    Duodenum - nonbleeding and EGD 11/13   Cardiac arrest ([place]) 1998   ICD implanted at [facility]   Cardiomyopathy, nonischemic ([place])    LVEF 18-56%   Chronic systolic heart failure ([place])    Essential hypertension    History of colonic polyps    Colonoscopy 11/13   Pneumonia    Severe with respiratory failure 10/13    Past Surgical History:  Procedure Laterality Date   [place]   COLONOSCOPY  09/26/2012   Procedure: COLONOSCOPY;  Surgeon: [name] Beams, MD;  Location: [place];  Service:  Endoscopy;  Laterality: N/A;   Defibrillator system revision  04/12/12   MDT Protecta XT VR implanted at [facility] by Dr [name] [name] with previously implanted system and leads extracted due to RV lead failure   ESOPHAGOGASTRODUODENOSCOPY  09/25/2012   Procedure: ESOPHAGOGASTRODUODENOSCOPY (EGD);  Surgeon: [name] Beams, MD;  Location: [facility] ENDOSCOPY;  Service: Endoscopy;  Laterality: N/A;   RIGHT/LEFT HEART CATH AND CORONARY ANGIOGRAPHY N/A 06/03/2021   Procedure: RIGHT/LEFT HEART CATH AND CORONARY ANGIOGRAPHY;  Surgeon: [name] Dresser, MD;  Location: [place] CV LAB;  Service: Cardiovascular;  Laterality: N/A;   TEE WITHOUT CARDIOVERSION N/A 06/02/2021   Procedure: TRANSESOPHAGEAL ECHOCARDIOGRAM (TEE);  Surgeon: [name] Perla, MD;  Location: [facility] ENDOSCOPY;  Service: Cardiovascular;  Laterality: N/A;    Social History   Socioeconomic History   Marital status: Single    Spouse name: Not on file   Number of children: Not on file   Years of education: Not on file   Highest education level: Not on file  Occupational History   Occupation: DIABLED  Tobacco Use   Smoking status: Former    Packs/day: 0.50    Years: 6.00    Pack years: 3.00    Types: Cigarettes    Start date: 05/13/1978    Quit date: 11/15/1996    Years since quitting: 25.1   Smokeless tobacco: Never  Vaping Use   Vaping Use: Never used  Substance and Sexual Activity   Alcohol use: No    Alcohol/week: 0.0 standard drinks   Drug use: Not Currently    Types: Marijuana    Comment: Many years ago   Sexual activity: Never  Other Topics Concern   Not on file  Social History Narrative   Lives in [place] with his father and 2 brothers.   Social Determinants of Health   Financial Resource Strain: Low Risk    Difficulty of Paying Living Expenses: Not very hard  Food Insecurity: No Food Insecurity   Worried About Charity fundraiser in the Last Year: Never true   Arboriculturist in the Last Year: Never true   Transportation Needs: Unmet Transportation Needs   Lack of Transportation (Medical): Yes   Lack of Transportation (Non-Medical): Yes  Physical Activity: Not on file  Stress: Not on file  Social Connections: Not on file  Intimate Partner Violence: Not on file    Family History  Problem Relation Age of Onset   Diabetes Mother  Died @ 80.   Hypertension Father        Alive @ 70.   Alzheimer's disease Father    Diabetes Brother        Brother also has htn   Hypertension Brother        Objective: Vitals:   01/07/22 0939  BP: 106/73  Pulse: 73     Physical Exam  Lab Results:  Results for orders placed or performed in visit on 01/07/22 (from the past 24 hour(s))  Urinalysis, Routine w reflex microscopic     Status: Abnormal   Collection Time: 01/07/22 11:55 AM  Result Value Ref Range   Specific Gravity, UA 1.015 1.005 - 1.030   pH, UA 7.0 5.0 - 7.5   Color, UA Amber (A) Yellow   Appearance Ur Clear Clear   Leukocytes,UA Negative Negative   Protein,UA 1+ (A) Negative/Trace   Glucose, UA 2+ (A) Negative   Ketones, UA Negative Negative   RBC, UA 2+ (A) Negative   Bilirubin, UA Negative Negative   Urobilinogen, Ur 1.0 0.2 - 1.0 mg/dL   Nitrite, UA Negative Negative   Microscopic Examination See below:    Narrative   Performed at:  [facility] [address], [place], [place]  [id number] Lab Director: Mina Marble MT, Phone:  [phone]  Microscopic Examination     Status: Abnormal   Collection Time: 01/07/22 11:55 AM   Urine  Result Value Ref Range   WBC, UA None seen 0 - 5 /hpf   RBC 3-10 (A) 0 - 2 /hpf   Epithelial Cells (non renal) 0-10 0 - 10 /hpf   Renal Epithel, UA None seen None seen /hpf   Mucus, UA Present Not Estab.   Bacteria, UA Few None seen/Few   Narrative   Performed at:  [facility] [address], [place], [place]  [id number] Lab Director: [place], Phone:  [phone]      BMET No results  for input(s): NA, K, CL, CO2, GLUCOSE, BUN, CREATININE, CALCIUM in the last 72 hours. PSA Lab Results  Component Value Date   PSA1 4.3 (H) 12/31/2021   PSA1 6.2 (H) 03/12/2021       Studies/Results: DG Chest 2 View  Result Date: 12/27/2021 CLINICAL DATA:  Chronic respiratory failure. EXAM: CHEST - 2 VIEW COMPARISON:  November 17, 2021.  May 25, 2021. FINDINGS: Stable cardiomegaly. Left-sided defibrillator is unchanged. No pneumothorax or pleural effusion is noted. New patchy perihilar opacities are noted which may represent edema or pneumonia, but masses or nodules cannot be excluded. Bony thorax is unremarkable. IMPRESSION: New patchy perihilar opacities are noted which may represent edema or pneumonia, but masses or nodules cannot be excluded. CT scan is recommended for further evaluation. Electronically Signed   By: Marijo Conception M.D.   On: 12/27/2021 13:41   CUP PACEART REMOTE DEVICE CHECK  Result Date: 12/21/2021 Scheduled remote reviewed. Normal device function.  Battery remains 2.63V, RRT 2.63V, no trigger 1 VF event 1/28 @ 06:16, duration 10sec, HR 353.  Event self limiting, aborted charge 1 high rate NSVT, duration 1sec, HR 283 Optivol crossed threshold 9/14 and is ongoing Next remote 3/9 LA  CUP PACEART REMOTE DEVICE CHECK  Result Date: 11/20/2021 Scheduled remote reviewed. Normal device function.  Optivol crossed threshold 9/14 and in ongoing, trending down Battery 2.63V, RRT 2.63V, no trigger at this time 2 NSVT, no therapies Next remote 2/6 LA  CUP PACEART REMOTE DEVICE CHECK  Result Date: 10/19/2021 Non billable scheduled remote reviewed. Normal device function.  Battery 2.63V (RRT 2.63V, no trigger) 1 NSVT, likely RVR Next remote 11/19/21 LR   Assessment & Plan: BPH with BOO.  He is doing well on finasteride alone.  I refilled that on 12/28/21.   Elevated PSA.  His PSA is down from prior levels.  He will have that repeated in 6 months   Microhematuria.  W/U negative.  Stable microhematuria today.   No orders of the defined types were placed in this encounter.    Orders Placed This Encounter  Procedures   Microscopic Examination   Urinalysis, Routine w reflex microscopic   PSA, total and free    Standing Status:   Future    Standing Expiration Date:   01/07/2023      Return in about 1 year (around 01/07/2023) for with PSA.   CC: Glenda Chroman, MD      [name] [name] 01/07/2022 Patient ID: [name] [name], male   DOB: [birth year], 66 y.o.   MRN: [id number]

## 2022-01-08 ENCOUNTER — Encounter (HOSPITAL_COMMUNITY): Payer: Self-pay | Admitting: Cardiology

## 2022-01-08 ENCOUNTER — Ambulatory Visit (HOSPITAL_COMMUNITY)
Admission: RE | Admit: 2022-01-08 | Discharge: 2022-01-08 | Disposition: A | Discharge status: Home / Self Care | Payer: Medicare HMO | Source: Ambulatory Visit | Attending: Cardiology | Admitting: Cardiology

## 2022-01-08 VITALS — BP 110/70 | HR 88 | Wt 221.0 lb

## 2022-01-08 DIAGNOSIS — I110 Hypertensive heart disease with heart failure: Secondary | ICD-10-CM | POA: Diagnosis not present

## 2022-01-08 DIAGNOSIS — I3139 Other pericardial effusion (noninflammatory): Secondary | ICD-10-CM | POA: Insufficient documentation

## 2022-01-08 DIAGNOSIS — Z8674 Personal history of sudden cardiac arrest: Secondary | ICD-10-CM | POA: Insufficient documentation

## 2022-01-08 DIAGNOSIS — I428 Other cardiomyopathies: Secondary | ICD-10-CM | POA: Insufficient documentation

## 2022-01-08 DIAGNOSIS — I4821 Permanent atrial fibrillation: Secondary | ICD-10-CM | POA: Insufficient documentation

## 2022-01-08 DIAGNOSIS — Z7901 Long term (current) use of anticoagulants: Secondary | ICD-10-CM | POA: Insufficient documentation

## 2022-01-08 DIAGNOSIS — I5022 Chronic systolic (congestive) heart failure: Secondary | ICD-10-CM | POA: Insufficient documentation

## 2022-01-08 DIAGNOSIS — I272 Pulmonary hypertension, unspecified: Secondary | ICD-10-CM | POA: Diagnosis not present

## 2022-01-08 DIAGNOSIS — Z79899 Other long term (current) drug therapy: Secondary | ICD-10-CM | POA: Diagnosis not present

## 2022-01-08 LAB — BASIC METABOLIC PANEL
Anion gap: 8 (ref 5–15)
BUN: 17 mg/dL (ref 8–23)
CO2: 24 mmol/L (ref 22–32)
Calcium: 9.5 mg/dL (ref 8.9–10.3)
Chloride: 105 mmol/L (ref 98–111)
Creatinine, Ser: 1.25 mg/dL — ABNORMAL HIGH (ref 0.61–1.24)
GFR, Estimated: >60 mL/min (ref >60)
Glucose, Bld: 98 mg/dL (ref 70–99)
Potassium: 3.9 mmol/L (ref 3.5–5.1)
Sodium: 137 mmol/L (ref 135–145)

## 2022-01-08 LAB — MAGNESIUM: Magnesium: 2 mg/dL (ref 1.7–2.4)

## 2022-01-08 LAB — BRAIN NATRIURETIC PEPTIDE: B Natriuretic Peptide: 205.7 pg/mL — ABNORMAL HIGH (ref 0.0–100.0)

## 2022-01-08 MED ORDER — POTASSIUM CHLORIDE CRYS ER 10 MEQ PO TBCR: 10.0000 meq | EXTENDED_RELEASE_TABLET | Freq: Every day | ORAL | 3 refills | Status: AC

## 2022-01-08 MED ORDER — SPIRONOLACTONE 25 MG PO TABS: 25.0000 mg | ORAL_TABLET | Freq: Every day | ORAL | 3 refills | Status: AC

## 2022-01-08 MED ORDER — FUROSEMIDE 20 MG PO TABS: 20.0000 mg | ORAL_TABLET | Freq: Every day | ORAL | 3 refills | Status: AC

## 2022-01-08 NOTE — Patient Instructions (Addendum)
Medication Changes:  Increase your spironolactone to 25 mg daily  Increase your lasix to 20 mg daily  Increase your potassium to 10 Meq Daily  Lab Work:  Labs done today, your results will be available in MyChart, we will contact you for abnormal readings.   Testing/Procedures:  Repeat blood work in 10 days at [facility]  Referrals:  none  Special Instructions // Education:  none  Follow-Up in: 3 months  At the [facility], you and your health needs are our priority. We have a designated team specialized in the treatment of Heart Failure. This Care Team includes your primary Heart Failure Specialized Cardiologist (physician), Advanced Practice Providers (APPs- Physician Assistants and Nurse Practitioners), and Pharmacist who all work together to provide you with the care you need, when you need it.   You may see any of the following providers on your designated Care Team at your next follow up:  Dr Glori Bickers Dr Haynes Kerns, NP Lyda Jester, [facility] [place], [place] Audry Riles, PharmD   Please be sure to bring in all your medications bottles to every appointment.   Need to Contact us:  If you have any questions or concerns before your next appointment please send [place] a message through [place] or call our office at [phone].    TO LEAVE A MESSAGE FOR THE NURSE SELECT OPTION 2, PLEASE LEAVE A MESSAGE INCLUDING: YOUR NAME DATE OF BIRTH CALL BACK NUMBER REASON FOR CALL**this is important as we prioritize the call backs  YOU WILL RECEIVE A CALL BACK THE SAME DAY AS LONG AS YOU CALL BEFORE 4:00 PM

## 2022-01-10 NOTE — Progress Notes (Signed)
PCP: Glenda Chroman, MD Cardiology: Dr. Domenic Polite HF Cardiology: Dr. Aundra Dubin  [name] is a 66 y.o. with a history of anemia, permanent atrial fibrillation, cardiac arrest 1999, Medtronic  ICD, NICM, HTN.  He has a long history of cardiomyopathy.     In 7/22, he had CT chest that showed multiple aneurysms in his pulmonary arterial bed. PCCM consulted and felt like this could be caused by pulmonary septic emboli. He was also noted to have small segmental PEs.  Started on heparin drip and vanc/cefepime/metronidazone. Lower extremity dopplers negative for DVT. Transferred to [name] for further management. TEE showed no vegetation.  Echo this admission showed EF 35-40%, D-shaped interventricular septum, moderate RV enlargement, mild RV dysfunction, moderate pericardial effusion, severe TR, PASP 74 mmHg.  RHC/LHC showed normal coronaries, moderate PAH.  Patient was diuresed.   Echo in 9/22 showed EF 40-45%, moderately decreased RV systolic function with severe RV enlargement, severe TR, dilated IVC, moderate pericardial effusion.   BP was running low and both Entresto and spironolactone were decreased.   In 1/23, he had a run of polymorphic VT that did not require therapy (brief).    He returns today for followup of CHF.  Weight is down 7 lbs.  No dyspnea walking on flat ground.  No chest pain.  No orthopnea/PND.  No palpitations or syncope.  No lightheadedness.    Medtronic device interrogation: Optivol with fluid index > threshold, decreased thoracic impedance.  No VT since episode of PMVT.    Labs (7/22): K 4, creatinine 1.27 Labs (10/22): K 4.5, creatinine 1.09 Labs (1/23): K 4.1, creatinine 1.43  6 minute walk (12/22): 305 m  ECG (personally reviewed): Atrial fibrillation  PMH:  1.  Permanent atrial fibrillation.  2.  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   - TEE showed no endocarditis, not mycotic aneurysms.  3.  Pulmonary  emboli: CT chest in 7/22 showed small segmental PEs.  - V/Q scan (7/22): No acute PE, ?chronic PEs.  4.  Chronic systolic CHF: Nonischemic cardiomyopathy.  Prominent RV failure.  Medtronic ICD.  - Echo (7/22) with EF 35-40%, D-shaped interventricular septum, moderate RV enlargement, mild RV dysfunction, moderate pericardial effusion, severe TR, PASP 74 mmHg.  - RHC/LHC (7/22): No CAD. Mean RA 7, PA 60/13 mean 29, mean PCWP 8, CI 2.53, PVR 3.5 WU.  - Echo (9/22): EF 40-45%, moderately decreased RV systolic function with severe RV enlargement, severe TR, dilated IVC, moderate pericardial effusion 5. Pulmonary hypertension: RHC (7/22) with Mean RA 7, PA 60/13 mean 29, mean PCWP 8, CI 2.53, PVR 3.5 WU. Echo in 7/22 with RV failure.  - V/Q scan with possible segmental chronic PEs.  - ANA, ANCA, HIV negative 7/22.  6. Cardiac arrest 1999: MDT ICD.  7. Pericardial effusion: moderate on last echo.   Social History   Socioeconomic History   Marital status: Single    Spouse name: Not on file   Number of children: Not on file   Years of education: Not on file   Highest education level: Not on file  Occupational History   Occupation: DIABLED  Tobacco Use   Smoking status: Former    Packs/day: 0.50    Years: 6.00    Pack years: 3.00    Types: Cigarettes    Start date: 05/13/1978    Quit date: 11/15/1996    Years since quitting: 25.1   Smokeless tobacco: Never  Vaping Use  Vaping Use: Never used  Substance and Sexual Activity   Alcohol use: No    Alcohol/week: 0.0 standard drinks   Drug use: Not Currently    Types: Marijuana    Comment: Many years ago   Sexual activity: Never  Other Topics Concern   Not on file  Social History Narrative   Lives in [place] with his father and 2 brothers.   Social Determinants of Health   Financial Resource Strain: Low Risk    Difficulty of Paying Living Expenses: Not very hard  Food Insecurity: No Food Insecurity   Worried About Sales executive in the Last Year: Never true   Arboriculturist in the Last Year: Never true  Transportation Needs: Unmet Transportation Needs   Lack of Transportation (Medical): Yes   Lack of Transportation (Non-Medical): Yes  Physical Activity: Not on file  Stress: Not on file  Social Connections: Not on file  Intimate Partner Violence: Not on file   Family History  Problem Relation Age of Onset   Diabetes Mother        Died @ 88.   Hypertension Father        Alive @ 53.   Alzheimer's disease Father    Diabetes Brother        Brother also has htn   Hypertension Brother    ROS: All systems reviewed and negative except as per HPI.   Current Outpatient Medications  Medication Sig Dispense Refill   Cholecalciferol (VITAMIN D3) 2000 UNITS capsule Take 2,000 Units by mouth daily.     ELIQUIS 5 MG TABS tablet TAKE 1 TABLET BY MOUTH TWICE A DAY 60 tablet 6   FARXIGA 10 MG TABS tablet TAKE 1 TABLET BY MOUTH DAILY BEFORE BREAKFAST*STOP DIGOXIN* 30 tablet 6   finasteride (PROSCAR) 5 MG tablet TAKE 1 TABLET BY MOUTH EVERY DAY 90 tablet 3   macitentan (OPSUMIT) 10 MG tablet Take 1 tablet (10 mg total) by mouth daily. 30 tablet 11   metoprolol succinate (TOPROL XL) 50 MG 24 hr tablet Take 1 tablet (50 mg total) by mouth daily. Take with or immediately following a meal. 30 tablet 6   polyethylene glycol powder (GLYCOLAX/MIRALAX) powder Take 17 g by mouth daily as needed. For constipation - mix with 8 oz liquid and drink     pravastatin (PRAVACHOL) 40 MG tablet Take 40 mg by mouth daily.     Riociguat (ADEMPAS) 2 MG TABS Take 2 mg by mouth 3 (three) times daily.     sacubitril-valsartan (ENTRESTO) 24-26 MG Take 1 tablet by mouth 2 (two) times daily. 60 tablet 6   furosemide (LASIX) 20 MG tablet Take 1 tablet (20 mg total) by mouth daily. 90 tablet 3   potassium chloride (KLOR-CON M) 10 MEQ tablet Take 1 tablet (10 mEq total) by mouth daily. 90 tablet 3   spironolactone (ALDACTONE) 25 MG tablet Take 1  tablet (25 mg total) by mouth daily. 90 tablet 3   No current facility-administered medications for this encounter.   BP 110/70    Pulse 88    Wt 100.2 kg (221 lb)    SpO2 93%    BMI 26.21 kg/m  General: NAD Neck: No JVD, no thyromegaly or thyroid nodule.  Lungs: Clear to auscultation bilaterally with normal respiratory effort. CV: Nondisplaced PMI.  Heart irregular S1/S2, no S3/S4, 3/6 HSM LLSB.  No peripheral edema.  No carotid bruit.  Normal pedal pulses.  Abdomen: Soft, nontender, no  hepatosplenomegaly, no distention.  Skin: Intact without lesions or rashes.  Neurologic: Alert and oriented x 3.  Psych: Normal affect. Extremities: No clubbing or cyanosis.  HEENT: Normal.   Assessment/Plan: 1. Acute on chronic systolic CHF with prominent RV failure: Cardiomyopathy x years with MDT ICD since 1999 cardiac arrest. No significant CAD on LHC in 7/22.  Nonischemic cardiomyopathy.  Echo in 7/22 showed EF 35-40%, D-shaped IV septum with mildly decreased RV systolic function and moderate RVE, moderate pericardial effusion, severe TR, dilated IVC, PASP 74 mmHg. Seems to have predominant RV failure.  Echo in 9/22 showed EF 40-45%, moderately decreased RV systolic function with severe RV enlargement, severe TR, dilated IVC, moderate pericardial effusion. NYHA class II, Optivol suggests volume overload.   - Suspect that his ICD is not MRI compatible (old model) so probably will not be able to get cardiac MRI. - Increase Lasix to 20 mg daily and KCl to 10 mEq daily. BMET/BNP today, BMET in 10 days.  - Continue Entresto 24/26 bid, dose decreased with hypotension.   - Can increase spironolactone back to 25 mg daily. This should not have much effect on BP.    - Continue Toprol XL 50 mg daily.   - Continue dapagliflozin 10 mg daily.  2. Pulmonary hypertension: Moderate PAH on 7/22 RHC, RV failure with severe TR noted on 9/22 echo.  Cause of PH + RV failure uncertain.  Patient has segmental PA aneurysms (see  below).  ?Chronic PEs => CTA looked like possible small segmental PEs. V/Q scan did not show acute PEs, possible small chronic PEs.  ANA, ANCA, HIV negative.  Possible group 1 and group 4 PH.  - Continue riociguat 2 mg tid for possible CTEPH given PAH and segmental PEs.  I do not think that the small PEs would be amenable to thromboendarterectomy or balloon pulmonary angioplasty.  - Continue Opsumit 10 mg daily.  3. Multiple segmental pulmonary artery aneurysms: Noted on CT chest in 7/22.  Cause is uncertain.  Initially thought to be mycotic aneurysms, but TEE showed no endocarditis and blood cultures negative.  CTA reviewed with Dr. Carlis Abbott (pulmonary), he may have small PEs but this does not appear to explain the pulmonary aneurysms.  ?Inflammatory disease such as Behcet's syndrome with elevated ESR and CRP.  ANCA and ANA negative.  He does not have a prominent history of mucocutaneous ulcers or other findings suggestive of Behcets.   - He has seen pulmonary.    4. Atrial fibrillation:  Chronic/permanent.  - Continue apixaban.  - Reasonable rate control on Toprol XL.   5. PE: Possible small segmental PEs on CT chest 7/22.  - Continue apixaban.  6. NSVT: Run of NSVT in 1/23.  No recurrence.  Has ICD.   - Check BMET, Mg today.  - Continue Toprol XL.   Followup in 6 wks with APP.   Loralie Champagne 01/10/2022

## 2022-01-19 ENCOUNTER — Encounter (HOSPITAL_COMMUNITY): Payer: Medicare HMO

## 2022-01-20 ENCOUNTER — Telehealth (HOSPITAL_COMMUNITY): Payer: Self-pay | Admitting: *Deleted

## 2022-01-20 ENCOUNTER — Other Ambulatory Visit: Payer: Self-pay | Admitting: Cardiology

## 2022-01-20 NOTE — Telephone Encounter (Signed)
Lab order faxed to [facility] [phone] ?

## 2022-01-21 ENCOUNTER — Telehealth: Payer: Self-pay

## 2022-01-21 ENCOUNTER — Ambulatory Visit (INDEPENDENT_AMBULATORY_CARE_PROVIDER_SITE_OTHER): Payer: Medicare HMO

## 2022-01-21 DIAGNOSIS — I5022 Chronic systolic (congestive) heart failure: Secondary | ICD-10-CM

## 2022-01-21 DIAGNOSIS — Z95810 Presence of automatic (implantable) cardiac defibrillator: Secondary | ICD-10-CM

## 2022-01-21 DIAGNOSIS — I428 Other cardiomyopathies: Secondary | ICD-10-CM

## 2022-01-21 LAB — CUP PACEART REMOTE DEVICE CHECK
Battery Voltage: 2.62 V
Brady Statistic RV Percent Paced: 0.7 %
Date Time Interrogation Session: 20230309043825
HighPow Impedance: 285 Ohm
HighPow Impedance: 44 Ohm
Implantable Lead Implant Date: 20130529
Implantable Lead Location: 753860
Implantable Lead Model: 6935
Implantable Pulse Generator Implant Date: 20130529
Lead Channel Impedance Value: 361 Ohm
Lead Channel Pacing Threshold Amplitude: 1 V
Lead Channel Pacing Threshold Pulse Width: 0.4 ms
Lead Channel Sensing Intrinsic Amplitude: 3.375 mV
Lead Channel Sensing Intrinsic Amplitude: 3.375 mV
Lead Channel Setting Pacing Amplitude: 2.5 V
Lead Channel Setting Pacing Pulse Width: 0.4 ms
Lead Channel Setting Sensing Sensitivity: 0.3 mV

## 2022-01-21 NOTE — Telephone Encounter (Signed)
Scheduled remote reviewed. Normal device function.   ?The battery voltage is 2.62 volts and RRT is 2.63 volts.  The patient has met the elective replacement indicator.   Sent to triage. ?Next remote 02/22/2022. ? ?Successful telephone encounter to patient to discuss device at RRT. All questions answered. Patient informed he would received a call from RN with procedure date and instructions. Verbalized understanding. Per last OV note with Dr. Rayann Heman, gen change discussed and ok to schedule procedure without additional in clinic visit. Routing to Myrtie Hawk, RN for scheduling.  ? ? ? ? ?

## 2022-01-21 NOTE — Telephone Encounter (Signed)
Outreach made to Pt. ? ?Pt scheduled for gen change March 05, 2022 at 2:30 pm . ? ?Will plan to get lab work February 05, 2022 at [facility] lab before or after appt with Dr. Melvyn Novas. ? ?Mailed instruction letter to Pt. ? ?Will forward to nursing at [facility] office to reinforce instructions and give soap. ? ?Pt indicates he uses a transportation service to get to and from [employer] will only be able to use local anesthetic for gen change. ? ? ?

## 2022-01-22 LAB — BASIC METABOLIC PANEL
BUN/Creatinine Ratio: 18 (ref 10–24)
BUN: 23 mg/dL (ref 8–27)
CO2: 22 mmol/L (ref 20–29)
Calcium: 9.9 mg/dL (ref 8.6–10.2)
Chloride: 106 mmol/L (ref 96–106)
Creatinine, Ser: 1.29 mg/dL — ABNORMAL HIGH (ref 0.76–1.27)
Glucose: 98 mg/dL (ref 70–99)
Potassium: 4.8 mmol/L (ref 3.5–5.2)
Sodium: 142 mmol/L (ref 134–144)
eGFR: 62 mL/min/{1.73_m2} (ref >59)

## 2022-01-28 NOTE — Telephone Encounter (Signed)
Spoke with patient yesterday - stated that he already has scrub at home.  Has not received instruction letter yet.  He will call office back if he has any questions after reading.   ?

## 2022-02-03 NOTE — Progress Notes (Signed)
Remote ICD transmission.   

## 2022-02-05 ENCOUNTER — Ambulatory Visit: Payer: Medicare HMO | Admitting: Internal Medicine

## 2022-02-05 ENCOUNTER — Encounter: Payer: Self-pay | Admitting: Internal Medicine

## 2022-02-05 ENCOUNTER — Other Ambulatory Visit (HOSPITAL_COMMUNITY)
Admission: RE | Admit: 2022-02-05 | Discharge: 2022-02-05 | Disposition: A | Discharge status: Home / Self Care | Payer: Medicare HMO | Source: Ambulatory Visit | Attending: Internal Medicine | Admitting: Internal Medicine

## 2022-02-05 ENCOUNTER — Ambulatory Visit (HOSPITAL_COMMUNITY)
Admission: RE | Admit: 2022-02-05 | Discharge: 2022-02-05 | Disposition: A | Discharge status: Home / Self Care | Payer: Medicare HMO | Source: Ambulatory Visit | Attending: Internal Medicine | Admitting: Internal Medicine

## 2022-02-05 ENCOUNTER — Other Ambulatory Visit: Payer: Self-pay

## 2022-02-05 VITALS — BP 120/60 | HR 100 | Temp 98.0°F | Ht 77.0 in | Wt 217.0 lb

## 2022-02-05 DIAGNOSIS — R918 Other nonspecific abnormal finding of lung field: Secondary | ICD-10-CM

## 2022-02-05 DIAGNOSIS — J9611 Chronic respiratory failure with hypoxia: Secondary | ICD-10-CM

## 2022-02-05 DIAGNOSIS — I272 Pulmonary hypertension, unspecified: Secondary | ICD-10-CM

## 2022-02-05 DIAGNOSIS — I5022 Chronic systolic (congestive) heart failure: Secondary | ICD-10-CM | POA: Insufficient documentation

## 2022-02-05 DIAGNOSIS — I428 Other cardiomyopathies: Secondary | ICD-10-CM

## 2022-02-05 DIAGNOSIS — Z95810 Presence of automatic (implantable) cardiac defibrillator: Secondary | ICD-10-CM

## 2022-02-05 LAB — BASIC METABOLIC PANEL
Anion gap: 7 (ref 5–15)
BUN: 27 mg/dL — ABNORMAL HIGH (ref 8–23)
CO2: 22 mmol/L (ref 22–32)
Calcium: 9.5 mg/dL (ref 8.9–10.3)
Chloride: 108 mmol/L (ref 98–111)
Creatinine, Ser: 1.21 mg/dL (ref 0.61–1.24)
GFR, Estimated: >60 mL/min (ref >60)
Glucose, Bld: 97 mg/dL (ref 70–99)
Potassium: 4.5 mmol/L (ref 3.5–5.1)
Sodium: 137 mmol/L (ref 135–145)

## 2022-02-05 LAB — CBC WITH DIFFERENTIAL/PLATELET
Abs Immature Granulocytes: 0.01 10*3/uL (ref 0.00–0.07)
Basophils Absolute: 0 10*3/uL (ref 0.0–0.1)
Basophils Relative: 1 %
Eosinophils Absolute: 0.2 10*3/uL (ref 0.0–0.5)
Eosinophils Relative: 3 %
HCT: 38 % — ABNORMAL LOW (ref 39.0–52.0)
Hemoglobin: 12.3 g/dL — ABNORMAL LOW (ref 13.0–17.0)
Immature Granulocytes: 0 %
Lymphocytes Relative: 24 %
Lymphs Abs: 1.2 10*3/uL (ref 0.7–4.0)
MCH: 28.5 pg (ref 26.0–34.0)
MCHC: 32.4 g/dL (ref 30.0–36.0)
MCV: 88 fL (ref 80.0–100.0)
Monocytes Absolute: 0.3 10*3/uL (ref 0.1–1.0)
Monocytes Relative: 6 %
Neutro Abs: 3.3 10*3/uL (ref 1.7–7.7)
Neutrophils Relative %: 66 %
Platelets: 234 10*3/uL (ref 150–400)
RBC: 4.32 MIL/uL (ref 4.22–5.81)
RDW: 16 % — ABNORMAL HIGH (ref 11.5–15.5)
WBC: 5 10*3/uL (ref 4.0–10.5)
nRBC: 0 % (ref 0.0–0.2)

## 2022-02-05 NOTE — Progress Notes (Signed)
? ?[name] [name], male    DOB: 05-Jun-1956,    MRN: [id number] ? ? ?Brief patient profile:  ?64 yobm quit smoking 1999 referred to pulmonary clinic 12/25/2021 by Dr Aundra Dubin  for pulmonary hypertension with last note 06/15/2021 ?    ?history of anemia, permanent atrial fibrillation, cardiac arrest 1999, Medtronic  ICD, NICM, HTN.  He has a long history of cardiomyopathy.   ?  ? 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 PCCM consulted and felt like this could be caused by pulmonary septic emboli. He was also noted to have small segmental PEs.  Started on heparin drip and vanc/cefepime/metronidazone. Lower extremity dopplers negative for DVT. Transferred to [name] for further management. TEE showed no vegetation.  Echo this admission showed EF 35-40%, D-shaped interventricular septum, moderate RV enlargement, mild RV dysfunction, moderate pericardial effusion, severe TR, PASP 74 mmHg.  RHC/LHC showed normal coronaries, moderate PAH ? ? ?History of Present Illness  ?12/25/2021  Pulmonary/ 1st office eval/[name] maint on eliquis/ PH rx per Cards ?Chief Complaint  ?Patient presents with  ? Pulmonary Consult  ?  Referred by Dr. Loralie Champagne for eval of pulmonary hypertension. Pt recently d/c from the hospital on o2 3lpm and has been using this mainly just at night.   ?Dyspnea:  feels like 02 slows him down / does not use it walking around house  ?Cough: none  ?Sleep: flat bed/ 2-3 pillows  ?SABA use: none ?02  3lpm sitting still / 3lpm prn daytime ?Rec ?Goal is to keep your 02 level above 90% at all times so continue to wear 02 at 3lpm at bedtime and as needed during the day ?Make sure you check your oxygen saturation  AT  your highest level of activity (not after you stop)   ?Please schedule a follow up visit in 3 months but call sooner if needed -  [place]  ? ? ? ? ?02/05/2022  f/u ov/[name]/[name] re: PH/ ? Septic emboli?   maint on 02 2lpm x with activty and 3lpm  hs   ?Chief Complaint  ?Patient presents with  ? Follow-up  ?  No increase in SO cough or chest pain ?CXR done before appt  ?  ? Dyspnea:  just around the house / not using 02 with walking as rec "too heavy" ?Cough: none ?Sleeping: flat bed / props up with pillows  ?SABA use: none  ?02: as above  ?Covid status: x 3  ?  ? ? ?No obvious day to day or daytime variability or assoc excess/ purulent sputum or mucus plugs or hemoptysis or cp or chest tightness, subjective wheeze or overt sinus or hb symptoms.  ? ?Sleeping  without nocturnal  or early am exacerbation  of respiratory  c/o's or need for noct saba. Also denies any obvious fluctuation of symptoms with weather or environmental changes or other aggravating or alleviating factors except as outlined above  ? ?No unusual exposure hx or h/o childhood pna/ asthma or knowledge of premature birth. ? ?Current Allergies, Complete Past Medical History, Past Surgical History, Family History, and Social History were reviewed in [employer] record. ? ?ROS  The following are not active complaints unless bolded ?Hoarseness, sore throat, dysphagia, dental problems, itching, sneezing,  nasal congestion or discharge of excess mucus or purulent secretions, ear ache,   fever, chills, sweats, unintended wt loss or wt gain, classically pleuritic or exertional cp,  orthopnea pnd or arm/hand  swelling  or leg swelling, presyncope, palpitations, abdominal pain, anorexia, nausea, vomiting, diarrhea  or change in bowel habits or change in bladder habits, change in stools or change in urine, dysuria, hematuria,  rash, arthralgias, visual complaints, headache, numbness, weakness or ataxia or problems with walking or coordination,  change in mood or  memory. ?      ? ?Current Meds  ?Medication Sig  ? Cholecalciferol (VITAMIN D3) 2000 UNITS capsule Take 2,000 Units by mouth daily.  ? ELIQUIS 5 MG TABS tablet TAKE 1 TABLET BY MOUTH TWICE A DAY  ? FARXIGA 10 MG TABS tablet  TAKE 1 TABLET BY MOUTH DAILY BEFORE BREAKFAST*STOP DIGOXIN*  ? finasteride (PROSCAR) 5 MG tablet TAKE 1 TABLET BY MOUTH EVERY DAY  ? furosemide (LASIX) 20 MG tablet Take 1 tablet (20 mg total) by mouth daily.  ? macitentan (OPSUMIT) 10 MG tablet Take 1 tablet (10 mg total) by mouth daily.  ? metoprolol succinate (TOPROL XL) 50 MG 24 hr tablet Take 1 tablet (50 mg total) by mouth daily. Take with or immediately following a meal.  ? polyethylene glycol powder (GLYCOLAX/MIRALAX) powder Take 17 g by mouth daily as needed. For constipation - mix with 8 oz liquid and drink  ? potassium chloride (KLOR-CON M) 10 MEQ tablet Take 1 tablet (10 mEq total) by mouth daily.  ? pravastatin (PRAVACHOL) 40 MG tablet Take 40 mg by mouth daily.  ? Riociguat (ADEMPAS) 2 MG TABS Take 2 mg by mouth 3 (three) times daily.  ? sacubitril-valsartan (ENTRESTO) 24-26 MG Take 1 tablet by mouth 2 (two) times daily.  ? spironolactone (ALDACTONE) 25 MG tablet Take 1 tablet (25 mg total) by mouth daily.  ?     ?  ?  ?   ? ?Past Medical History:  ?Diagnosis Date  ? Anemia   ? Atrial fibrillation ([place])   ? AVM (arteriovenous malformation)   ? Duodenum - nonbleeding and EGD 11/13  ? Cardiac arrest [facility]) 1998  ? ICD [facility] at [facility]  ? Cardiomyopathy, nonischemic ([place])   ? LVEF 35-40%  ? Chronic systolic heart failure ([place])   ? Essential hypertension   ? History of colonic polyps   ? Colonoscopy 11/13  ? Pneumonia   ? Severe with respiratory failure 10/13  ? ?  ? ? ?Objective:  ?  ? ?Wt Readings from Last 3 Encounters:  ?02/05/22 217 lb (98.4 kg)  ?01/08/22 221 lb (100.2 kg)  ?01/07/22 220 lb (99.8 kg)  ?  ? ? ?Vital signs reviewed  02/05/2022  - Note at rest 02 sats  96% on RA  ? ?General appearance:    amb bm nad  ? ? HEENT : pt wearing mask not removed for exam due to covid -19 concerns.  ? ? ?NECK :  without JVD/Nodes/TM/ nl carotid upstrokes bilaterally ? ? ?LUNGS: no acc muscle use,  Nl contour chest which is clear to A and P bilaterally without  cough on insp or exp maneuvers ? ? ?CV:  RRR  no s3  2/6 SEM with mil mild  increase in P2, and no edema  ? ?ABD:  soft and nontender with nl inspiratory excursion in the supine position. No bruits or organomegaly appreciated, bowel sounds nl ? ?MS:  Nl gait/ ext warm without deformities, calf tenderness, cyanosis or clubbing ?No obvious joint restrictions  ? ?SKIN: warm and dry without lesions   ? ?NEURO:  alert, approp, nl sensorium with  no motor or cerebellar deficits apparent.    ?  ? ?  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 ? ?   ?Assessment  ? ?  ?  ? ? ?  ? ?        ? ? ?  ?  ?

## 2022-02-05 NOTE — Patient Instructions (Signed)
We will be referring you for best fit for portable 02  ? ?The goal is to keep your saturations above 90% at all times - this will help your heart  ? ? ?Please schedule a follow up office visit in 6 weeks, call sooner if needed with cxr  ?

## 2022-02-08 ENCOUNTER — Encounter: Payer: Self-pay | Admitting: Internal Medicine

## 2022-02-08 NOTE — Assessment & Plan Note (Signed)
See cxr 12/25/2021   ? Septic emboli ? > improved 02/05/2022  ? ?Clinically and radiographically improved > no change in rx and f/u in 6 weeks with cxr ?

## 2022-02-08 NOTE — Assessment & Plan Note (Signed)
Started on 02 05/2021 ?- 12/25/2021   Walked on RA x  2  lap(s) =  approx 500  ft  @ moderate pace, stopped due to desats s sob  with lowest 02 sats 88%  ?-02/05/2022   Walked on RA  x  2  lap(s) =  approx 300  ft  @ slow to mod pace, stopped due to desats to 88%  with lowest 02 sats 91% on 2lpm  ? ?Due to [facility] rec continue 3lpm hs and titrate daytime to keep > 90% at all times ? ?Referred for BEST fit for amb 02 again  ?

## 2022-02-08 NOTE — Assessment & Plan Note (Signed)
Moderate in pt with longstanding L sided chf complicated by PE and pulmonary aneurysms suggestive of mycotic process but all cultures and rheum w/u neg as of 05/2021  ? ?rx per cards planned  ? ?Each maintenance medication was reviewed in detail including emphasizing most importantly the difference between maintenance and prns and under what circumstances the prns are to be triggered using an action plan format where appropriate. ? ?Total time for H and P, chart review, counseling,  directly observing portions of ambulatory 02 saturation study/ and generating customized AVS unique to this office visit / same day charting  > 72mn  ?     ?  ?       ?

## 2022-02-08 NOTE — Addendum Note (Signed)
Addended by: Fritzi Mandes D on: 02/08/2022 12:23 PM ? ? Modules accepted: Orders ? ?

## 2022-02-08 NOTE — Addendum Note (Signed)
Addended by: Fritzi Mandes D on: 02/08/2022 01:24 PM ? ? Modules accepted: Orders ? ?

## 2022-02-22 ENCOUNTER — Ambulatory Visit (INDEPENDENT_AMBULATORY_CARE_PROVIDER_SITE_OTHER): Payer: Medicare HMO

## 2022-02-22 DIAGNOSIS — I428 Other cardiomyopathies: Secondary | ICD-10-CM

## 2022-02-23 LAB — CUP PACEART REMOTE DEVICE CHECK
Battery Voltage: 2.62 V
Brady Statistic RV Percent Paced: 0.8 %
Date Time Interrogation Session: 20230410012304
HighPow Impedance: 285 Ohm
HighPow Impedance: 44 Ohm
Implantable Lead Implant Date: 20130529
Implantable Lead Location: 753860
Implantable Lead Model: 6935
Implantable Pulse Generator Implant Date: 20130529
Lead Channel Impedance Value: 361 Ohm
Lead Channel Pacing Threshold Amplitude: 1 V
Lead Channel Pacing Threshold Pulse Width: 0.4 ms
Lead Channel Sensing Intrinsic Amplitude: 4 mV
Lead Channel Sensing Intrinsic Amplitude: 4 mV
Lead Channel Setting Pacing Amplitude: 2.5 V
Lead Channel Setting Pacing Pulse Width: 0.4 ms
Lead Channel Setting Sensing Sensitivity: 0.3 mV

## 2022-03-02 ENCOUNTER — Telehealth: Payer: Self-pay | Admitting: Internal Medicine

## 2022-03-02 NOTE — Telephone Encounter (Signed)
Patient had questions about transportation for his procedure on 03/05/2022.  States his aunt can't drive him long distances like going to [employer].  Advised him to contact member services number on the back of his insurance card first to see if he has access to transportation benefits.  [facility] of [facility] also given.  ?

## 2022-03-02 NOTE — Telephone Encounter (Signed)
Patient called to talk to Dr. Rayann Heman or nurse in regards to procedure that is being performed on 4/21. Please call back ?

## 2022-03-03 NOTE — Pre-Procedure Instructions (Signed)
Instructed patient on the following items: ?Arrival time 1230 ?Nothing to eat or drink after midnight ?No meds AM of procedure ?Responsible person to drive you home and stay with you for 24 hrs ?Wash with special soap night before and morning of procedure ?If on anti-coagulant drug instructions eliquis- last dose tonight. ? ?[name] [name] [phone] has been arranged for this patient they will transport at 5:00  ?

## 2022-03-04 ENCOUNTER — Ambulatory Visit: Payer: Medicare HMO | Admitting: Urology

## 2022-03-05 ENCOUNTER — Ambulatory Visit (HOSPITAL_COMMUNITY)
Admission: RE | Admit: 2022-03-05 | Discharge: 2022-03-05 | Disposition: A | Discharge status: Home / Self Care | Payer: Medicare HMO | Attending: Internal Medicine | Admitting: Internal Medicine

## 2022-03-05 ENCOUNTER — Encounter (HOSPITAL_COMMUNITY)
Admission: RE | Disposition: A | Discharge status: Home / Self Care | Payer: Medicare HMO | Source: Home / Self Care | Attending: Internal Medicine

## 2022-03-05 DIAGNOSIS — I5022 Chronic systolic (congestive) heart failure: Secondary | ICD-10-CM | POA: Diagnosis not present

## 2022-03-05 DIAGNOSIS — I428 Other cardiomyopathies: Secondary | ICD-10-CM | POA: Insufficient documentation

## 2022-03-05 DIAGNOSIS — Z4502 Encounter for adjustment and management of automatic implantable cardiac defibrillator: Secondary | ICD-10-CM | POA: Insufficient documentation

## 2022-03-05 DIAGNOSIS — Z8674 Personal history of sudden cardiac arrest: Secondary | ICD-10-CM | POA: Insufficient documentation

## 2022-03-05 DIAGNOSIS — I110 Hypertensive heart disease with heart failure: Secondary | ICD-10-CM | POA: Insufficient documentation

## 2022-03-05 DIAGNOSIS — I472 Ventricular tachycardia, unspecified: Secondary | ICD-10-CM | POA: Diagnosis not present

## 2022-03-05 DIAGNOSIS — I4892 Unspecified atrial flutter: Secondary | ICD-10-CM | POA: Diagnosis not present

## 2022-03-05 HISTORY — PX: ICD GENERATOR CHANGEOUT: EP1231

## 2022-03-05 SURGERY — ICD GENERATOR CHANGEOUT

## 2022-03-05 MED ORDER — CEFAZOLIN SODIUM-DEXTROSE 2-4 GM/100ML-% IV SOLN
2.0000 g | INTRAVENOUS | Status: AC
  Administered 2022-03-05: 2 g via INTRAVENOUS
  Filled 2022-03-05: qty 100

## 2022-03-05 MED ORDER — SODIUM CHLORIDE 0.9 % IV SOLN: INTRAVENOUS | Status: DC

## 2022-03-05 MED ORDER — SODIUM CHLORIDE 0.9% FLUSH: 3.0000 mL | Freq: Two times a day (BID) | INTRAVENOUS | Status: DC

## 2022-03-05 MED ORDER — SODIUM CHLORIDE 0.9 % IV SOLN: 250.0000 mL | INTRAVENOUS | Status: DC | PRN

## 2022-03-05 MED ORDER — SODIUM CHLORIDE 0.9 % IV SOLN
INTRAVENOUS | Status: AC
  Filled 2022-03-05: qty 2

## 2022-03-05 MED ORDER — LIDOCAINE HCL (PF) 1 % IJ SOLN
INTRAMUSCULAR | Status: AC
Start: 2022-03-05 — End: ?
  Filled 2022-03-05: qty 30

## 2022-03-05 MED ORDER — LIDOCAINE HCL (PF) 1 % IJ SOLN
INTRAMUSCULAR | Status: DC | PRN
  Administered 2022-03-05: 60 mL

## 2022-03-05 MED ORDER — CHLORHEXIDINE GLUCONATE 4 % EX LIQD
4.0000 "application " | Freq: Once | CUTANEOUS | Status: DC
  Filled 2022-03-05: qty 60

## 2022-03-05 MED ORDER — SODIUM CHLORIDE 0.9% FLUSH: 3.0000 mL | INTRAVENOUS | Status: DC | PRN

## 2022-03-05 MED ORDER — POVIDONE-IODINE 10 % EX SWAB
2.0000 "application " | Freq: Once | CUTANEOUS | Status: AC
  Administered 2022-03-05: 2 via TOPICAL

## 2022-03-05 MED ORDER — ONDANSETRON HCL 4 MG/2ML IJ SOLN: 4.0000 mg | Freq: Four times a day (QID) | INTRAMUSCULAR | Status: DC | PRN

## 2022-03-05 MED ORDER — SODIUM CHLORIDE 0.9 % IV SOLN
80.0000 mg | INTRAVENOUS | Status: AC
  Administered 2022-03-05: 80 mg
  Filled 2022-03-05: qty 2

## 2022-03-05 MED ORDER — LIDOCAINE HCL (PF) 1 % IJ SOLN
INTRAMUSCULAR | Status: AC
  Filled 2022-03-05: qty 30

## 2022-03-05 MED ORDER — CEFAZOLIN SODIUM-DEXTROSE 2-4 GM/100ML-% IV SOLN
INTRAVENOUS | Status: AC
  Filled 2022-03-05: qty 100

## 2022-03-05 MED ORDER — ACETAMINOPHEN 325 MG PO TABS: 325.0000 mg | ORAL_TABLET | ORAL | Status: DC | PRN

## 2022-03-05 SURGICAL SUPPLY — 7 items
CABLE SURGICAL S-101-97-12 (CABLE) ×3 IMPLANT
ICD VISIA MRI VR DVFB1D4 (ICD Generator) IMPLANT
PAD DEFIB RADIO PHYSIO CONN (PAD) ×3 IMPLANT
POUCH AIGIS-R ANTIBACT ICD (Mesh General) ×2 IMPLANT
POUCH AIGIS-R ANTIBACT ICD LRG (Mesh General) IMPLANT
TRAY PACEMAKER INSERTION (PACKS) ×3 IMPLANT
VISIA MRI VR DVFB1D4 (ICD Generator) ×2 IMPLANT

## 2022-03-05 NOTE — Discharge Instructions (Addendum)
Resume eliquis on 03/06/22 with pm dose    ? ? ? ?Implantable Cardiac Device Battery Change, Care After ? ?This sheet gives you information about how to care for yourself after your procedure. Your health care provider may also give you more specific instructions. If you have problems or questions, contact your health care provider. ?What can I expect after the procedure? ?After your procedure, it is common to have: ?Pain or soreness at the site where the cardiac device was inserted. ?Swelling at the site where the cardiac device was inserted. ?You should received an information card for your new device in 4-8 weeks. ?Follow these instructions at home: ?Incision care  ?Keep the incision clean and dry. ?Do not take baths, swim, or use a hot tub until after your wound check.  ?Do not shower for at least 7 days, or as directed by your health care provider. ?Pat the area dry with a clean towel. Do not rub the area. This may cause bleeding. ?Follow instructions from your health care provider about how to take care of your incision. Make sure you: ?Leave stitches (sutures), skin glue, or adhesive strips in place. These skin closures may need to stay in place for 2 weeks or longer. If adhesive strip edges start to loosen and curl up, you may trim the loose edges. Do not remove adhesive strips completely unless your health care provider tells you to do that. ?Check your incision area every day for signs of infection. Check for: ?More redness, swelling, or pain. ?More fluid or blood. ?Warmth. ?Pus or a bad smell. ?Activity ?Do not lift anything that is heavier than 10 lb (4.5 kg) until your health care provider says it is okay to do so. ?For the first week, or as long as told by your health care provider: ?Avoid lifting your affected arm higher than your shoulder. ?After 1 week, Be gentle when you move your arms over your head. It is okay to raise your arm to comb your hair. ?Avoid strenuous exercise. ?Ask your health care  provider when it is okay to: ?Resume your normal activities. ?Return to work or school. ?Resume sexual activity. ?Eating and drinking ?Eat a heart-healthy diet. This should include plenty of fresh fruits and vegetables, whole grains, low-fat dairy products, and lean protein like chicken and fish. ?Limit alcohol intake to no more than 1 drink a day for non-pregnant women and 2 drinks a day for men. One drink equals 12 oz of beer, 5 oz of wine, or 1? oz of hard liquor. ?Check ingredients and nutrition facts on packaged foods and beverages. Avoid the following types of food: ?Food that is high in salt (sodium). ?Food that is high in saturated fat, like full-fat dairy or red meat. ?Food that is high in trans fat, like fried food. ?Food and drinks that are high in sugar. ?Lifestyle ?Do not use any products that contain nicotine or tobacco, such as cigarettes and e-cigarettes. If you need help quitting, ask your health care provider. ?Take steps to manage and control your weight. ?Once cleared, get regular exercise. Aim for 150 minutes of moderate-intensity exercise (such as walking or yoga) or 75 minutes of vigorous exercise (such as running or swimming) each week. ?Manage other health problems, such as diabetes or high blood pressure. Ask your health care provider how you can manage these conditions. ?General instructions ?Do not drive for 24 hours after your procedure if you were given a medicine to help you relax (sedative). ?Take over-the-counter and  prescription medicines only as told by your health care provider. ?Avoid putting pressure on the area where the cardiac device was placed. ?If you need an MRI after your cardiac device has been placed, be sure to tell the health care provider who orders the MRI that you have a cardiac device. ?Avoid close and prolonged exposure to electrical devices that have strong magnetic fields. These include: ?Cell phones. Avoid keeping them in a pocket near the cardiac device, and  try using the ear opposite the cardiac device. ?MP3 players. ?Household appliances, like microwaves. ?Metal detectors. ?Electric generators. ?High-tension wires. ?Keep all follow-up visits as directed by your health care provider. This is important. ?Contact a health care provider if: ?You have pain at the incision site that is not relieved by over-the-counter or prescription medicines. ?You have any of these around your incision site or coming from it: ?More redness, swelling, or pain. ?Fluid or blood. ?Warmth to the touch. ?Pus or a bad smell. ?You have a fever. ?You feel brief, occasional palpitations, light-headedness, or any symptoms that you think might be related to your heart. ?Get help right away if: ?You experience chest pain that is different from the pain at the cardiac device site. ?You develop a red streak that extends above or below the incision site. ?You experience shortness of breath. ?You have palpitations or an irregular heartbeat. ?You have light-headedness that does not go away quickly. ?You faint or have dizzy spells. ?Your pulse suddenly drops or increases rapidly and does not return to normal. ?You begin to gain weight and your legs and ankles swell. ?Summary ?After your procedure, it is common to have pain, soreness, and some swelling where the cardiac device was inserted. ?Make sure to keep your incision clean and dry. Follow instructions from your health care provider about how to take care of your incision. ?Check your incision every day for signs of infection, such as more pain or swelling, pus or a bad smell, warmth, or leaking fluid and blood. ?Avoid strenuous exercise and lifting your left arm higher than your shoulder for 2 weeks, or as long as told by your health care provider. ?This information is not intended to replace advice given to you by your health care provider. Make sure you discuss any questions you have with your health care provider.  ?

## 2022-03-05 NOTE — H&P (Signed)
?PCP: Glenda Chroman, MD ?Primary Cardiologist: Dr Domenic Polite ?CHF:  [name] [name] ?Primary EP: Dr Rayann Heman ?  ?[name] [name] is a 66 y.o. male who presents today for ICD generator change.  Since last being seen in our clinic, the patient reports doing very well.  Today, he denies symptoms of palpitations, chest pain, shortness of breath (above baseline),  lower extremity edema, dizziness, presyncope, syncope, or ICD shocks.  The patient is otherwise without complaint today.  ?  ?    ?Past Medical History:  ?Diagnosis Date  ? Anemia    ? Atrial fibrillation ([place])    ? AVM (arteriovenous malformation)    ?  Duodenum - nonbleeding and EGD 11/13  ? Cardiac arrest [facility]) 1998  ?  ICD implanted at [facility]  ? Cardiomyopathy, nonischemic ([place])    ?  LVEF 35-40%  ? Chronic systolic heart failure ([place])    ? Essential hypertension    ? History of colonic polyps    ?  Colonoscopy 11/13  ? Pneumonia    ?  Severe with respiratory failure 10/13  ?  ?     ?Past Surgical History:  ?Procedure Laterality Date  ? [place]  ? COLONOSCOPY   09/26/2012  ?  Procedure: COLONOSCOPY;  Surgeon: Beryle Beams, MD;  Location: [place];  Service: Endoscopy;  Laterality: N/A;  ? Defibrillator system revision   04/12/12  ?  MDT Protecta XT VR implanted at [facility] by Dr Deno Etienne with previously implanted system and leads extracted due to RV lead failure  ? ESOPHAGOGASTRODUODENOSCOPY   09/25/2012  ?  Procedure: ESOPHAGOGASTRODUODENOSCOPY (EGD);  Surgeon: Beryle Beams, MD;  Location: [facility] ENDOSCOPY;  Service: Endoscopy;  Laterality: N/A;  ? RIGHT/LEFT HEART CATH AND CORONARY ANGIOGRAPHY N/A 06/03/2021  ?  Procedure: RIGHT/LEFT HEART CATH AND CORONARY ANGIOGRAPHY;  Surgeon: Larey Dresser, MD;  Location: [place] CV LAB;  Service: Cardiovascular;  Laterality: N/A;  ? TEE WITHOUT CARDIOVERSION N/A 06/02/2021  ?  Procedure: TRANSESOPHAGEAL ECHOCARDIOGRAM (TEE);  Surgeon: Lelon Perla, MD;  Location: [facility] ENDOSCOPY;  Service:  Cardiovascular;  Laterality: N/A;  ?  ?  ?ROS- all systems are reviewed and negative except as per HPI above ?  ?      ?Current Outpatient Medications  ?Medication Sig Dispense Refill  ? carvedilol (COREG) 3.125 MG tablet Take 1 tablet (3.125 mg total) by mouth 2 (two) times daily with a meal. 60 tablet 0  ? Cholecalciferol (VITAMIN D3) 2000 UNITS capsule Take 2,000 Units by mouth daily.      ? dapagliflozin propanediol (FARXIGA) 10 MG TABS tablet Take 1 tablet (10 mg total) by mouth daily before breakfast. 30 tablet 6  ? ELIQUIS 5 MG TABS tablet TAKE 1 TABLET BY MOUTH TWICE A DAY 60 tablet 6  ? finasteride (PROSCAR) 5 MG tablet TAKE 1 TABLET BY MOUTH EVERY DAY 90 tablet 3  ? furosemide (LASIX) 20 MG tablet Take 1 tablet (20 mg total) by mouth daily as needed for fluid or edema. 30 tablet    ? polyethylene glycol powder (GLYCOLAX/MIRALAX) powder Take 17 g by mouth daily as needed. For constipation - mix with 8 oz liquid and drink      ? potassium chloride SA (KLOR-CON) 20 MEQ tablet Take 1 tablet (20 mEq total) by mouth daily. 30 tablet 0  ? pravastatin (PRAVACHOL) 40 MG tablet Take 40 mg by mouth daily.      ? Riociguat (ADEMPAS) 2 MG TABS  Take 2 mg by mouth 3 (three) times daily.      ? sacubitril-valsartan (ENTRESTO) 49-51 MG Take 1 tablet by mouth 2 (two) times daily. 60 tablet 6  ? spironolactone (ALDACTONE) 25 MG tablet TAKE 1/2 TABLET BY MOUTH EVERY DAY 45 tablet 3  ?  ?No current facility-administered medications for this visit.  ?  ?  ?Physical Exam: ?Vitals:  ? 03/05/22 1239  ?BP: (!) 105/58  ?Pulse: 89  ?Temp: 98.2 ?F (36.8 ?C)  ?SpO2: 94%  ? ?  ?GEN- The patient is well appearing, alert and oriented x 3 today.   ?Head- normocephalic, atraumatic ?Eyes-  Sclera clear, conjunctiva pink ?Ears- hearing intact ?Oropharynx- clear ?Lungs- Clear to ausculation bilaterally, normal work of breathing ?Chest- ICD pocket is well healed ?Heart- Regular rate and rhythm, no murmurs, rubs or gallops, PMI not laterally  displaced ?GI- soft, NT, ND, + BS ?Extremities- no clubbing, cyanosis, or edema ?  ?ICD interrogation- reviewed in detail today,  See PACEART report ?   ?Assessment and Plan: ?  ?1.  Chronic systolic dysfunction/ nonischemic CM ?euvolemic today ?Stable on an appropriate medical regimen ?Normal ICD function ?R waves today were again noted to be low (2 mV).  However tip to coil R waves were noted to be 5.8.  I have therefore programmed to use tip to coil R waves going forward.  I do not place to revise his RV lead unless absolutely necessary. ?Implanted for secondary prevention with prior cardiac arrest ?See [facility] Art report ?No changes today ?he is not device dependant today ? ?He has a narrow QRS and does not meet criteria for upgrade to CRT. ?  ?ICD Criteria ? ?Current LVEF:40%. Within 12 months prior to implant: Yes  ? ?Heart failure history: Yes, Class III ? ?Cardiomyopathy history: Yes, Non-Ischemic Cardiomyopathy. ? ?Atrial Fibrillation/Atrial Flutter: Yes, Permanent. ? ?Ventricular tachycardia history: Yes, Hemodynamic instability present. 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 ? ?Cardiac arrest history: Yes, Ventricular Fibrillation. ? ?History of syndromes with risk of sudden death: No. ? ?Previous ICD: Yes, Reason for ICD:  Secondary prevention. ? ?Current ICD indication: Secondary ? ?PPM indication: No. ? ?Class I or II Bradycardia indication present: No ? ?Beta Blocker therapy for 3 or more months: Yes, prescribed.  ? ?Ace Inhibitor/ARB therapy for 3 or more months: Yes, prescribed.  ? ? ?I have seen [name] [name] is a 66 y.o. male whop resents today for ICD generator change for  secondary prevention of sudden death.  The patient's chart has been reviewed and they meet criteria for ICD implant.  I have had a thorough discussion with the patient reviewing options.  The patient has had opportunities to ask questions and have them answered. The patient and I have decided together  through the [place] Support Tool to proceed with replacement of his ICD at this time.  Risks, benefits, alternatives to ICD generator change were discussed in detail with the patient today. The patient  understands that the risks include but are not limited to bleeding, infection, pneumothorax, perforation, tamponade, vascular damage, renal failure, MI, stroke, death, inappropriate shocks, and lead dislodgement and wishes to proceed.   ? ?Thompson Grayer MD, [facility] ?03/05/2022 ?1:48 PM ? ?

## 2022-03-08 ENCOUNTER — Encounter (HOSPITAL_COMMUNITY): Payer: Self-pay | Admitting: Internal Medicine

## 2022-03-09 NOTE — Addendum Note (Signed)
Addended by: Cheri Kearns A on: 03/09/2022 12:35 PM ? ? Modules accepted: Level of Service ? ?

## 2022-03-09 NOTE — Progress Notes (Signed)
Remote ICD transmission.   

## 2022-03-17 ENCOUNTER — Ambulatory Visit: Payer: Medicare HMO

## 2022-03-18 ENCOUNTER — Ambulatory Visit: Payer: Medicare HMO | Admitting: Urology

## 2022-03-23 ENCOUNTER — Ambulatory Visit (INDEPENDENT_AMBULATORY_CARE_PROVIDER_SITE_OTHER): Payer: Medicare HMO

## 2022-03-23 DIAGNOSIS — I428 Other cardiomyopathies: Secondary | ICD-10-CM

## 2022-03-23 LAB — CUP PACEART INCLINIC DEVICE CHECK
Battery Remaining Longevity: 132 mo
Battery Voltage: 3.01 V
Brady Statistic RV Percent Paced: 1.26 %
Date Time Interrogation Session: 20230509115708
HighPow Impedance: 39 Ohm
Implantable Lead Implant Date: 20130529
Implantable Lead Location: 753860
Implantable Lead Model: 6935
Implantable Pulse Generator Implant Date: 20230421
Lead Channel Impedance Value: 285 Ohm
Lead Channel Impedance Value: 361 Ohm
Lead Channel Pacing Threshold Amplitude: 0.875 V
Lead Channel Pacing Threshold Pulse Width: 0.4 ms
Lead Channel Sensing Intrinsic Amplitude: 5.375 mV
Lead Channel Setting Pacing Amplitude: 2 V
Lead Channel Setting Pacing Pulse Width: 0.4 ms
Lead Channel Setting Sensing Sensitivity: 0.3 mV

## 2022-03-23 NOTE — Patient Instructions (Addendum)
? ?  After Your ICD ?(Implantable Cardiac Defibrillator) ? ? ? ?Monitor your defibrillator site for redness, swelling, and drainage. If it gets bigger than the outline call the device clinic at [phone] if you experience these symptoms or fever/chills. Do Not take your eliquis until Wednesday May 17th.  ? ?Your incision was closed with Steri-strips or staples:  You may shower and wash your incision with soap and water. Avoid lotions, ointments, or perfumes over your incision until it is well-healed. ? ?You may use a hot tub or a pool after your wound check appointment if the incision is completely closed. ? ?Your ICD is MRI compatible. ? ?Your ICD is designed to protect you from life threatening heart rhythms. Because of this, you may receive a shock.  ? ?1 shock with no symptoms:  Call the office during business hours. ?1 shock with symptoms (chest pain, chest pressure, dizziness, lightheadedness, shortness of breath, overall feeling unwell):  Call 911. ?If you experience 2 or more shocks in 24 hours:  Call 911. ?If you receive a shock, you should not drive.  ?[place] DMV - no driving for 6 months if you receive appropriate therapy from your ICD.  ? ?ICD Alerts:  Some alerts are vibratory and others beep. These are NOT emergencies. Please call our office to let us know. If this occurs at night or on weekends, it can wait until the next business day. Send a remote transmission. ? ?If your device is capable of reading fluid status (for heart failure), you will be offered monthly monitoring to review this with you.  ? ?Remote monitoring is used to monitor your ICD from home. This monitoring is scheduled every 91 days by our office. It allows [place] to keep an eye on the functioning of your device to ensure it is working properly. You will routinely see your Electrophysiologist annually (more often if necessary).  ?

## 2022-03-23 NOTE — Progress Notes (Signed)
Wound check appointment s/p ICD gen change 03/05/22. Steri-strips removed. Wound without redness. Hematoma noted and marked. Patient has resumed eliquis as instructed. Hematoma borders marked. Incision edges approximated, wound well healed. Normal device function. Thresholds, sensing, and impedances consistent with implant measurements. Device programmed at chronic lead threshold settings. Chronic AF. Histogram distribution appropriate for patient and level of activity. No ventricular arrhythmias noted. Patient educated about wound care, arm mobility, lifting restrictions, shock plan. Hematoma assessed by Dr. Curt Bears. Patient instructed to hold eliquis until Wednesday May 17. He is scheduled for a return wound check visit 04/01/22 at 0920. Patient is strongly advised to keep appointment and to call device clinic if bleeding or hematoma  ?worsens.  ? ? ? ? ?

## 2022-03-25 ENCOUNTER — Other Ambulatory Visit: Payer: Self-pay | Admitting: Cardiology

## 2022-03-25 MED ORDER — METOPROLOL SUCCINATE ER 50 MG PO TB24
50.0000 mg | ORAL_TABLET | Freq: Every day | ORAL | 6 refills | Status: AC
Start: 2022-03-25 — End: ?

## 2022-03-26 ENCOUNTER — Ambulatory Visit: Payer: Medicare HMO | Admitting: Internal Medicine

## 2022-03-29 NOTE — Progress Notes (Signed)
? ? ?Cardiology Office Note ? ?Date: 03/30/2022  ? ?ID: [name] [name], DOB 09/26/1956, MRN [id number] ? ?PCP:  Glenda Chroman, MD  ?Cardiologist:  Rozann Lesches, MD ?Electrophysiologist:  Thompson Grayer, MD  ? ?Chief Complaint  ?Patient presents with  ? Cardiac follow-up  ? ? ?History of Present Illness: ?[name] [name] is a 66 y.o. male last seen in February.  He has had interval follow-up in the heart failure clinic, also status post ICD generator change in April.  He reports NYHA class II dyspnea with typical activities, vague sense of palpitations but no chest pain.  Reports no leg swelling, no orthopnea or PND. ? ?He has a Medtronic ICD in place with follow-up by Dr. Rayann Heman.  He did have a hematoma at pocket site after generator change, has been off Eliquis temporarily with plan to resume tomorrow and have wound check the following day.  Based on the picture in the chart, his pocket site looks much better today. ? ?At his last visit in the heart failure clinic Lasix dose and potassium supplement were increased along with Aldactone.  No change in his pulmonary hypertension regimen.  He has not had any follow-up lab work as yet. ? ?Last echocardiogram was in September 2022 at which point LVEF was 40 to 45%. ? ?Past Medical History:  ?Diagnosis Date  ? Anemia   ? Atrial fibrillation ([place])   ? AVM (arteriovenous malformation)   ? Duodenum - nonbleeding and EGD 11/13  ? Cardiac arrest [facility]) 1998  ? ICD implanted at [facility]  ? Cardiomyopathy, nonischemic ([place])   ? LVEF 35-40%  ? Chronic systolic heart failure ([place])   ? Essential hypertension   ? History of colonic polyps   ? Colonoscopy 11/13  ? Pneumonia   ? Severe with respiratory failure 10/13  ? ? ?Past Surgical History:  ?Procedure Laterality Date  ? [place]  ? COLONOSCOPY  09/26/2012  ? Procedure: COLONOSCOPY;  Surgeon: Beryle Beams, MD;  Location: [place];  Service: Endoscopy;  Laterality: N/A;  ? Defibrillator system  revision  04/12/12  ? MDT Protecta XT VR implanted at [facility] by Dr Deno Etienne with previously implanted system and leads extracted due to RV lead failure  ? ESOPHAGOGASTRODUODENOSCOPY  09/25/2012  ? Procedure: ESOPHAGOGASTRODUODENOSCOPY (EGD);  Surgeon: Beryle Beams, MD;  Location[facility] ENDOSCOPY;  Service: Endoscopy;  Laterality: N/A;  ? ICD GENERATOR CHANGEOUT N/A 03/05/2022  ? Procedure: ICD GENERATOR CHANGEOUT;  Surgeon: Thompson Grayer, MD;  Location: [place] CV LAB;  Service: Cardiovascular;  Laterality: N/A;  ? RIGHT/LEFT HEART CATH AND CORONARY ANGIOGRAPHY N/A 06/03/2021  ? Procedure: RIGHT/LEFT HEART CATH AND CORONARY ANGIOGRAPHY;  Surgeon: Larey Dresser, MD;  Location: [place] CV LAB;  Service: Cardiovascular;  Laterality: N/A;  ? TEE WITHOUT CARDIOVERSION N/A 06/02/2021  ? Procedure: TRANSESOPHAGEAL ECHOCARDIOGRAM (TEE);  Surgeon: Lelon Perla, MD;  Location: [facility] ENDOSCOPY;  Service: Cardiovascular;  Laterality: N/A;  ? ? ?Current Outpatient Medications  ?Medication Sig Dispense Refill  ? ADEMPAS 2.5 MG TABS Take 1 tablet by mouth 3 (three) times daily.    ? Cholecalciferol (VITAMIN D3) 2000 UNITS capsule Take 2,000 Units by mouth daily.    ? ELIQUIS 5 MG TABS tablet TAKE 1 TABLET BY MOUTH TWICE A DAY 60 tablet 6  ? FARXIGA 10 MG TABS tablet TAKE 1 TABLET BY MOUTH DAILY BEFORE BREAKFAST*STOP DIGOXIN* 30 tablet 6  ? finasteride (PROSCAR) 5 MG tablet TAKE 1 TABLET BY  MOUTH EVERY DAY 90 tablet 3  ? furosemide (LASIX) 20 MG tablet Take 1 tablet (20 mg total) by mouth daily. 90 tablet 3  ? macitentan (OPSUMIT) 10 MG tablet Take 1 tablet (10 mg total) by mouth daily. 30 tablet 11  ? metoprolol succinate (TOPROL XL) 50 MG 24 hr tablet Take 1 tablet (50 mg total) by mouth daily. Take with or immediately following a meal. 30 tablet 6  ? polyethylene glycol powder (GLYCOLAX/MIRALAX) powder Take 17 g by mouth daily as needed for moderate constipation. For constipation - mix with 8 oz liquid and drink    ?  potassium chloride (KLOR-CON M) 10 MEQ tablet Take 1 tablet (10 mEq total) by mouth daily. 90 tablet 3  ? pravastatin (PRAVACHOL) 40 MG tablet Take 40 mg by mouth daily.    ? sacubitril-valsartan (ENTRESTO) 24-26 MG Take 1 tablet by mouth 2 (two) times daily. 60 tablet 6  ? spironolactone (ALDACTONE) 25 MG tablet Take 1 tablet (25 mg total) by mouth daily. 90 tablet 3  ? ?No current facility-administered medications for this visit.  ? ?Allergies:  Patient has no known allergies.  ? ?ROS: No syncope. ? ?Physical Exam: ?VS:  BP 100/70   Pulse 90   Ht '6\' 4"'$  (1.93 m)   Wt 215 lb (97.5 kg)   SpO2 91%   BMI 26.17 kg/m? , BMI Body mass index is 26.17 kg/m?. ? ?Wt Readings from Last 3 Encounters:  ?03/30/22 215 lb (97.5 kg)  ?03/05/22 210 lb (95.3 kg)  ?02/05/22 217 lb (98.4 kg)  ?  ?General: Patient appears comfortable at rest.  Oxygen via nasal cannula. ?HEENT: Conjunctiva and lids normal. ?Neck: Supple, no elevated JVP or carotid bruits, no thyromegaly. ?Lungs: Clear to auscultation, nonlabored breathing at rest. ?Cardiac: Irregularly irregular, no S3, 1/6 systolic murmur. ?Extremities: No pitting edema. ? ?ECG:  An ECG dated 01/08/2022 was personally reviewed today and demonstrated:  Atrial fibrillation with low voltage. ? ?Recent Labwork: ?05/29/2021: TSH 4.689 ?05/31/2021: ALT 11; AST 15 ?01/08/2022: B Natriuretic Peptide 205.7; Magnesium 2.0 ?02/05/2022: BUN 27; Creatinine, Ser 1.21; Hemoglobin 12.3; Platelets 234; Potassium 4.5; Sodium 137  ? ?Other Studies Reviewed Today: ? ?Echocardiogram 07/23/2021: ? 1. Left ventricular ejection fraction, by estimation, is 40 to 45%. The  ?left ventricle has low normal function. The left ventricle has no regional  ?wall motion abnormalities. Left ventricular diastolic parameters are  ?indeterminate.  ? 2. Right ventricular systolic function is moderately reduced. The right  ?ventricular size is severely enlarged. There is severely elevated  ?pulmonary artery systolic pressure.  ?  3. Right atrial size was severely dilated.  ? 4. Moderate pericardial effusion. The pericardial effusion is  ?circumferential. There is no evidence of cardiac tamponade.  ? 5. The mitral valve is normal in structure. No evidence of mitral valve  ?regurgitation. No evidence of mitral stenosis.  ? 6. The tricuspid valve is abnormal. Tricuspid valve regurgitation is  ?severe.  ? 7. The aortic valve is tricuspid. Aortic valve regurgitation is not  ?visualized. No aortic stenosis is present.  ? 8. The inferior vena cava is dilated in size with <50% respiratory  ?variability, suggesting right atrial pressure of 15 mmHg.  ? ?Assessment and Plan: ? ?1.  HFrEF with nonischemic cardiomyopathy and RV dysfunction associated with moderate to severe pulmonary hypertension.  Follow-up BMET on current regimen, diuretics were intensified at last visit in the heart failure clinic.  Currently on Toprol-XL, Aldactone, Entresto, Farxiga, and Lasix.  Recheck echocardiogram  for next visit in 3 months. ? ?2.  Permanent atrial fibrillation with CHA2DS2-VASc score of at least 2.  He continues on Toprol-XL along with Eliquis. ? ?3.  Medtronic ICD in place with recent generator change.  Did have pocket hematoma thereafter and was off Eliquis temporarily, plan to resume tomorrow.  Pocket site looks much better compared to pictures in the chart.  He has a wound check on Thursday. ? ?4.  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  Potassium normal at 4.5. ? ?5.  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  Continues follow-up with Dr. Aundra Dubin.  On Opsumit and Adempas. ? ?Medication Adjustments/Labs and Tests Ordered: ?Current medicines are reviewed at length with the patient today.  Concerns regarding medicines are outlined above.  ? ?Tests Ordered: ?Orders Placed This Encounter  ?Procedures  ? Basic metabolic panel  ? ECHOCARDIOGRAM COMPLETE  ? ? ?Medication Changes: ?No orders of the defined types were placed in this  encounter. ? ? ?Disposition:  Follow up  3 months. ? ?Signed, ?Satira Sark, MD, [facility] ?03/30/2022 9:38 AM    ?[place] [facility] ?[address], [place], [place] [address] ?Phone: [phone]; Fax: [phone]

## 2022-03-30 ENCOUNTER — Encounter: Payer: Self-pay | Admitting: Cardiology

## 2022-03-30 ENCOUNTER — Ambulatory Visit: Payer: Medicare HMO | Admitting: Cardiology

## 2022-03-30 VITALS — BP 100/70 | HR 90 | Ht 76.0 in | Wt 215.0 lb

## 2022-03-30 DIAGNOSIS — I4821 Permanent atrial fibrillation: Secondary | ICD-10-CM | POA: Diagnosis not present

## 2022-03-30 DIAGNOSIS — I5020 Unspecified systolic (congestive) heart failure: Secondary | ICD-10-CM | POA: Diagnosis not present

## 2022-03-30 DIAGNOSIS — I5022 Chronic systolic (congestive) heart failure: Secondary | ICD-10-CM

## 2022-03-30 DIAGNOSIS — I429 Cardiomyopathy, unspecified: Secondary | ICD-10-CM

## 2022-03-30 NOTE — Patient Instructions (Signed)
Medication Instructions:  ?Your physician recommends that you continue on your current medications as directed. Please refer to the Current Medication list given to you today.  ? ?Labwork: ?BMET ([place]) ? ?Testing/Procedures: ?Your physician has requested that you have an echocardiogram. Echocardiography is a painless test that uses sound waves to create images of your heart. It provides your doctor with information about the size and shape of your heart and how well your heart?s chambers and valves are working. This procedure takes approximately one hour. There are no restrictions for this procedure.  ? ?Follow-Up: ?Your physician recommends that you schedule a follow-up appointment in: 3 months ? ?Any Other Special Instructions Will Be Listed Below (If Applicable). ? ?If you need a refill on your cardiac medications before your next appointment, please call your pharmacy. ? ?

## 2022-04-01 ENCOUNTER — Ambulatory Visit (INDEPENDENT_AMBULATORY_CARE_PROVIDER_SITE_OTHER): Payer: Medicare HMO

## 2022-04-01 DIAGNOSIS — I4811 Longstanding persistent atrial fibrillation: Secondary | ICD-10-CM

## 2022-04-01 NOTE — Progress Notes (Signed)
Hematoma assessed and per patient it has decreased in size. No bleeding or drainage noted to site. Firm when palpated. Spoke to Dr. Curt Bears and verbal order obtained to restart Eliquis Sunday 04/04/2022 and have patient come back into DC and Dr. Rayann Heman to look at hematoma on 04/07/22 @ 9:00am.   Patient educated to call device clinic if swelling starts again, do not wait until next wound check. Voiced understanding.

## 2022-04-01 NOTE — Patient Instructions (Addendum)
Please continue to monitor for increased swelling, redness or drainage.   [facility] [phone].  Restart Eliquis Sunday 04/04/2022. You will come back to see Dr. Rayann Heman in office 04/07/2022.

## 2022-04-07 ENCOUNTER — Ambulatory Visit (INDEPENDENT_AMBULATORY_CARE_PROVIDER_SITE_OTHER): Payer: Medicare HMO

## 2022-04-07 ENCOUNTER — Encounter (HOSPITAL_COMMUNITY): Payer: Medicare HMO | Admitting: Cardiology

## 2022-04-07 DIAGNOSIS — I5022 Chronic systolic (congestive) heart failure: Secondary | ICD-10-CM

## 2022-04-07 NOTE — Progress Notes (Signed)
Device clinic wound recheck appointment s/p ICD generator change .  Hematoma resolved.  Edges remain approximated.  Wound well healed.  Site assessed by Dr. Rayann Heman.  Follow up at 91 days as scheduled.  Pt has resumed [place].

## 2022-04-15 ENCOUNTER — Ambulatory Visit (INDEPENDENT_AMBULATORY_CARE_PROVIDER_SITE_OTHER): Payer: Medicare HMO | Admitting: Urology

## 2022-04-15 VITALS — BP 90/64 | HR 74 | Ht 76.0 in | Wt 210.0 lb

## 2022-04-15 DIAGNOSIS — N401 Enlarged prostate with lower urinary tract symptoms: Secondary | ICD-10-CM

## 2022-04-15 DIAGNOSIS — N138 Other obstructive and reflux uropathy: Secondary | ICD-10-CM

## 2022-04-15 NOTE — Progress Notes (Deleted)
Subjective:  1. BPH with urinary obstruction    04/15/22: [name]  01/07/22: [name] returns today in f/u.  His PSA is back down to 4.3 with a 26% f/t ratio.  He remains on finasteride.  He is voiding well with an IPSS of 3.  His UA has 3-10 RBC's.  He is now on home O2.  He is now on Eliquis and off of warfarin.   07/09/21: [name] returns today in f/u.   He had a PSA done on 03/12/21 and it was 6.2 with a 21% f/t ratio.  His is down from his level last year.  He remains on finasteride.   He had a CT hematuria study done that showed several non-GU findings including possible appendicitis for which he saw Dr. Arnoldo [name] and was not felt to require surgery and pulmonary lesions for which he was hospitalized and given antibiotics for possible septic emboli but an echo showed no vegetations.  He did have an enlarged prostate as well.  HIs UA has 3-10 RBC's today.    4/28/2: He returns today in f/u.  He remains on finasteride. He didn't get the PSA done.  He is voiding well with an IPSS of 5 with some urgency.     GU Hx; [name] is a former patient of Dr. Exie [name] with a history of and elevated PSA and BPH with BOO. He is off of  tamsulosin but remains on finasteride. His PSA was 7.3 last year and is 7.2 prior to this visit.  It was  up to 8 in 2019  6.4.  He has had 3 negative biopsies in 2012, 2014 and 2016. He had cystoscopy 4-5 years ago with BPH and BOO with a large prostate. He is voiding well but is on furosemide which contributes to urgency and nocturia.   He has a good stream and empties well. He has no hematuria or dysuria. His IPSS is 10 today.   He is on warfarin but is having trouble maintaining a therapeutic level.        ROS:  ROS:  A complete review of systems was performed.  All systems are negative except for pertinent findings as noted.   ROS  No Known Allergies  Outpatient Encounter Medications as of 04/15/2022  Medication Sig   ADEMPAS 2.5 MG TABS Take  1 tablet by mouth 3 (three) times daily.   Cholecalciferol (VITAMIN D3) 2000 UNITS capsule Take 2,000 Units by mouth daily.   ELIQUIS 5 MG TABS tablet TAKE 1 TABLET BY MOUTH TWICE A DAY   FARXIGA 10 MG TABS tablet TAKE 1 TABLET BY MOUTH DAILY BEFORE BREAKFAST*STOP DIGOXIN*   finasteride (PROSCAR) 5 MG tablet TAKE 1 TABLET BY MOUTH EVERY DAY   furosemide (LASIX) 20 MG tablet Take 1 tablet (20 mg total) by mouth daily.   macitentan (OPSUMIT) 10 MG tablet Take 1 tablet (10 mg total) by mouth daily.   metoprolol succinate (TOPROL XL) 50 MG 24 hr tablet Take 1 tablet (50 mg total) by mouth daily. Take with or immediately following a meal.   polyethylene glycol powder (GLYCOLAX/MIRALAX) powder Take 17 g by mouth daily as needed for moderate constipation. For constipation - mix with 8 oz liquid and drink   potassium chloride (KLOR-CON M) 10 MEQ tablet Take 1 tablet (10 mEq total) by mouth daily.   pravastatin (PRAVACHOL) 40 MG tablet Take 40 mg by mouth daily.   sacubitril-valsartan (ENTRESTO) 24-26 MG Take 1 tablet by mouth 2 (  two) times daily.   spironolactone (ALDACTONE) 25 MG tablet Take 1 tablet (25 mg total) by mouth daily.   No facility-administered encounter medications on file as of 04/15/2022.    Past Medical History:  Diagnosis Date   Anemia    Atrial fibrillation ([place])    AVM (arteriovenous malformation)    Duodenum - nonbleeding and EGD 11/13   Cardiac arrest ([place]) 1998   ICD implanted at [facility]   Cardiomyopathy, nonischemic ([place])    LVEF 72-82%   Chronic systolic heart failure ([place])    Essential hypertension    History of colonic polyps    Colonoscopy 11/13   Pneumonia    Severe with respiratory failure 10/13    Past Surgical History:  Procedure Laterality Date   [place]   COLONOSCOPY  09/26/2012   Procedure: COLONOSCOPY;  Surgeon: Beryle Beams, MD;  Location: [place];  Service: Endoscopy;  Laterality: N/A;   Defibrillator system  revision  04/12/12   MDT Protecta XT VR implanted at [facility] by Dr Deno Etienne with previously implanted system and leads extracted due to RV lead failure   ESOPHAGOGASTRODUODENOSCOPY  09/25/2012   Procedure: ESOPHAGOGASTRODUODENOSCOPY (EGD);  Surgeon: Beryle Beams, MD;  Location: [facility] ENDOSCOPY;  Service: Endoscopy;  Laterality: N/A;   ICD GENERATOR CHANGEOUT N/A 03/05/2022   Procedure: ICD GENERATOR CHANGEOUT;  Surgeon: Thompson Grayer, MD;  Location: [place] CV LAB;  Service: Cardiovascular;  Laterality: N/A;   RIGHT/LEFT HEART CATH AND CORONARY ANGIOGRAPHY N/A 06/03/2021   Procedure: RIGHT/LEFT HEART CATH AND CORONARY ANGIOGRAPHY;  Surgeon: Larey Dresser, MD;  Location: [place] CV LAB;  Service: Cardiovascular;  Laterality: N/A;   TEE WITHOUT CARDIOVERSION N/A 06/02/2021   Procedure: TRANSESOPHAGEAL ECHOCARDIOGRAM (TEE);  Surgeon: Lelon Perla, MD;  Location: [facility] ENDOSCOPY;  Service: Cardiovascular;  Laterality: N/A;    Social History   Socioeconomic History   Marital status: Single    Spouse name: Not on file   Number of children: Not on file   Years of education: Not on file   Highest education level: Not on file  Occupational History   Occupation: DIABLED  Tobacco Use   Smoking status: Former    Packs/day: 0.50    Years: 6.00    Pack years: 3.00    Types: Cigarettes    Start date: 05/13/1978    Quit date: 11/15/1996    Years since quitting: 25.4   Smokeless tobacco: Never  Vaping Use   Vaping Use: Never used  Substance and Sexual Activity   Alcohol use: No    Alcohol/week: 0.0 standard drinks   Drug use: Not Currently    Types: Marijuana    Comment: Many years ago   Sexual activity: Never  Other Topics Concern   Not on file  Social History Narrative   Lives in [place] with his father and 2 brothers.   Social Determinants of Health   Financial Resource Strain: Low Risk    Difficulty of Paying Living Expenses: Not very hard  Food Insecurity: No Food  Insecurity   Worried About Charity fundraiser in the Last Year: Never true   Arboriculturist in the Last Year: Never true  Transportation Needs: Unmet Transportation Needs   Lack of Transportation (Medical): Yes   Lack of Transportation (Non-Medical): Yes  Physical Activity: Not on file  Stress: Not on file  Social Connections: Not on file  Intimate Partner Violence: Not on file  Family History  Problem Relation Age of Onset   Diabetes Mother        Died @ 63.   Hypertension Father        Alive @ 50.   Alzheimer's disease Father    Diabetes Brother        Brother also has htn   Hypertension Brother        Objective: Vitals:   04/15/22 0858  BP: 90/64  Pulse: 74     Physical Exam  Lab Results:  No results found for this or any previous visit (from the past 24 hour(s)).     BMET No results for input(s): NA, K, CL, CO2, GLUCOSE, BUN, CREATININE, CALCIUM in the last 72 hours. PSA Lab Results  Component Value Date   PSA1 4.3 (H) 12/31/2021   PSA1 6.2 (H) 03/12/2021       Studies/Results: DG Chest 2 View  Result Date: 02/07/2022 CLINICAL DATA:  Dyspnea on exertion EXAM: CHEST - 2 VIEW COMPARISON:  12/25/2021 FINDINGS: Cardiac shadow is enlarged but stable. Defibrillator is again noted. Patchy airspace disease is noted within both lungs slightly improved when compared with the prior exam. No new focal infiltrate or effusion is seen. Calcified splenic cyst is noted stable from previous CTs. IMPRESSION: Slight improved aeration in the lungs bilaterally. Some persistent airspace opacities remain although improved from the prior exam. Electronically Signed   By: Inez Catalina M.D.   On: 02/07/2022 23:36   EP PPM/ICD IMPLANT  Result Date: 03/05/2022 SURGEON:  Thompson Grayer, MD    PREPROCEDURE DIAGNOSES:  1. Nonischemic cardiomyopathy.  2. New York Heart Association class III, heart failure chronically.  3. Prior cardiac arrest    POSTPROCEDURE DIAGNOSES:  1.  Nonischemic cardiomyopathy.  2. New York Heart Association class III, heart failure chronically.  3. Prior cardiac arrest  PROCEDURES:   1. ICD pulse generator replacement  2. Skin pocket revision    INTRODUCTION:  SHRIHAAN PORZIO is a 66 y.o. male with a nonischemic CM , NYHA Class III CHF, and permanent afib s/p ICD implantation for secondary prevention who presents today for ICD pulse generator replacement due to end of life battery status.  He has had prior cardiac arrest many years ago. The patient has done well since prior device implantation.  He did have ICD system revision at [facility] due to threatened erosion previously..  The patient presents today for ICD pulse generator replacement.    DESCRIPTION OF PROCEDURE:  Informed written consent was obtained and the patient was brought to the electrophysiology lab in the fasting state.  The patients defibrillator was again interrogated and confirmed to be at [facility] battery status. The patient required no sedation for the procedure today.  The patient's left chest was prepped and draped in the usual sterile fashion by the EP lab staff.  The skin overlying the left deltopectoral region was infiltrated with lidocaine for local analgesia.  A 5-cm incision was made over the existing ICD pocket.  Electrocautery was used to assure hemostasis. The device was exposed and removed from the pocket.  It was noted to be in a subpectoral location.  There was no foreign matter or debris within the pocket. The device was disconnected from the leads.  The leads were examined thoroughly and their integrity confirmed to be intact. The right ventricular lead was confirmed to be a Medtronic model [id number]  (serial # [id number]) lead implanted 04/12/2012.  The right ventricular lead R-wave measured 4.3  mV with  impedance of 342 ohms and a threshold of  1 volts at 0.4 milliseconds.  The lead was then connected to a Medtronic model [id number] ICD (SN [id number]). The pocket was revised to  accomodate this new device.  The pocket was irrigated with copious gentamicin solution.  The defibrillator was placed into a Tyrex pouch and then placed into the subpectoral pocket.  The pocket was then closed in 2 layers with 2.0 Vicryl suture  for the subcutaneous and subcuticular layers. EBL<18m.  Steri-Strips and a  sterile dressing were then applied.  There were no early apparent complications.    CONCLUSIONS:  1. Nonischemic cardiomyopathy with chronic New York Heart Association class III heart failure, and prior cardiac arrest  2. ICD at elective replacement time  3. Successful ICD pulse generator replacement with an Medtronic single chamber ICD for secondary prevention  4. No early apparent complications. JThompson GrayerMD, FGlendale Advent[facility] - [facility]4/21/2023 4:21 PM   CUP PACEART INCLINIC DEVICE CHECK  Result Date: 03/23/2022 Wound check appointment s/p ICD gen change 03/05/22. Steri-strips removed. Wound without redness. Hematoma noted and marked. Patient has resumed eliquis as instructed. Hematoma borders marked. Incision edges approximated, wound well healed. Normal device function. Thresholds, sensing, and impedances consistent with implant measurements. Device programmed at chronic lead threshold settings. Chronic AF. Histogram distribution appropriate for patient and level of activity. No ventricular arrhythmias noted. Patient educated about wound care, arm mobility, lifting restrictions, shock plan. Hematoma assessed by Dr. CCurt Bears Patient instructed to hold eliquis until Wednesday May 17. He is scheduled for a return wound check visit 04/01/22 at 0920. Patient is strongly advised to keep appointment and to call device clinic if bleeding or hematoma worsens.  CUP PACEART REMOTE DEVICE CHECK  Result Date: 02/23/2022 Scheduled remote reviewed. Normal device function.  Device has reached RRT, gen change scheduled 4/21 Next remote to be determined [place]  CUP PACEART REMOTE DEVICE CHECK  Result Date: 01/21/2022 Scheduled  remote reviewed. Normal device function.  The battery voltage is 2.62 volts and RRT is 2.63 volts.  The patient has met the elective replacement indicator.   Sent to triage. Next remote 02/22/2022. MKathy Breach RN, CCDS, [employer]   Assessment & Plan: BPH with BOO.  He is doing well on finasteride alone.  I refilled that on 12/28/21.   Elevated PSA.  His PSA is down from prior levels.  He will have that repeated in 6 months   Microhematuria.  W/U negative. Stable microhematuria today.   No orders of the defined types were placed in this encounter.    Orders Placed This Encounter  Procedures   Urinalysis, Routine w reflex microscopic      No follow-ups on file.   CC: VGlenda Chroman MD      JIrine Seal6/11/2021 Patient ID: AValarie Cones male   DOB: [birth year] 66y.o.   MRN: [id number]

## 2022-04-16 NOTE — Progress Notes (Signed)
Visit cancelled.  It was an old appt that hadn't been cancelled.

## 2022-04-21 ENCOUNTER — Telehealth: Payer: Self-pay | Admitting: Cardiology

## 2022-04-21 NOTE — Telephone Encounter (Signed)
Patient is returning call to discuss lab results. 

## 2022-04-22 NOTE — Telephone Encounter (Signed)
-----   Message from Satira Sark, MD sent at 04/20/2022  2:37 PM EDT ----- Results reviewed.  Stable renal function with creatinine 1.28 and normal potassium of 4.6.  Continue with current medications and follow-up plan.

## 2022-04-22 NOTE — Telephone Encounter (Signed)
Patient informed. Copy sent to PCP °

## 2022-05-03 ENCOUNTER — Ambulatory Visit (INDEPENDENT_AMBULATORY_CARE_PROVIDER_SITE_OTHER): Payer: Medicare HMO | Admitting: Internal Medicine

## 2022-05-03 ENCOUNTER — Encounter: Payer: Self-pay | Admitting: Internal Medicine

## 2022-05-03 ENCOUNTER — Ambulatory Visit (HOSPITAL_COMMUNITY)
Admission: RE | Admit: 2022-05-03 | Discharge: 2022-05-03 | Disposition: A | Discharge status: Home / Self Care | Payer: Medicare HMO | Source: Ambulatory Visit | Attending: Internal Medicine | Admitting: Internal Medicine

## 2022-05-03 DIAGNOSIS — R918 Other nonspecific abnormal finding of lung field: Secondary | ICD-10-CM | POA: Diagnosis not present

## 2022-05-03 DIAGNOSIS — J9611 Chronic respiratory failure with hypoxia: Secondary | ICD-10-CM

## 2022-05-03 NOTE — Patient Instructions (Signed)
Please remember to go to the  x-ray department  @  [facility] for your tests - we will call you with the results when they are available     We will approve you for Portable 02 2lpm continuous today.  Make sure you check your oxygen saturation  AT  your highest level of activity (not after you stop)   to be sure it stays over 90% and adjust  02 flow upward to maintain this level if needed but remember to turn it back to previous settings when you stop (to conserve your supply).   Remember oxygen is for heart, not alwarys for your breathing so your have to be sure it's set right (like a carburetor/ fuel injector)   Schedule PFTs prior to next visity   Please schedule a follow up visit in 6 months but call sooner if needed   Late add:  needs CT with contrast re increased mass like density R mid lung

## 2022-05-03 NOTE — Progress Notes (Unsigned)
[name] [name], male    DOB: [birth year],    MRN: [id number]   Brief patient profile:  27 yobm quit smoking 1999 referred to pulmonary clinic 12/25/2021 by Dr Aundra Dubin  for pulmonary hypertension with last note 06/15/2021     history of anemia, permanent atrial fibrillation, cardiac arrest 1999, Medtronic  ICD, NICM, HTN.  He has a long history of cardiomyopathy.      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 PCCM consulted and felt like this could be caused by pulmonary septic emboli. He was also noted to have small segmental PEs.  Started on heparin drip and vanc/cefepime/metronidazone. Lower extremity dopplers negative for DVT. Transferred to [name] for [facility] management. TEE showed no vegetation.  Echo this admission showed EF 35-40%, D-shaped interventricular septum, moderate RV enlargement, mild RV dysfunction, moderate pericardial effusion, severe TR, PASP 74 mmHg.  RHC/LHC showed normal coronaries, moderate PAH   History of Present Illness  12/25/2021  Pulmonary/ 1st office eval/[name] [name] maint on eliquis/ PH rx per Cards Chief Complaint  Patient presents with   Pulmonary Consult    Referred by Dr. Loralie Champagne for eval of pulmonary hypertension. Pt recently d/c from the hospital on o2 3lpm and has been using this mainly just at night.   Dyspnea:  feels like 02 slows him down / does not use it walking around house  Cough: none  Sleep: flat bed/ 2-3 pillows  SABA use: none 02  3lpm sitting still / 3lpm prn daytime Rec Goal is to keep your 02 level above 90% at all times so continue to wear 02 at 3lpm at bedtime and as needed during the day Make sure you check your oxygen saturation  AT  your highest level of activity (not after you stop)   Please schedule a follow up visit in 3 months but call sooner if needed -  [place]      02/05/2022  f/u ov/[place] office/[name] [name] re: PH/ ? Septic emboli?  maint on 02 2lpm x with activty and 3lpm  hs   Chief Complaint  Patient presents with   Follow-up    No increase in SO cough or chest pain CXR done before appt     Dyspnea:  just around the house / not using 02 with walking as rec "too heavy" Cough: none Sleeping: flat bed / props up with pillows  SABA use: none  02: as above  Covid status: x 3  Rec We will be referring you for best fit for portable 02  The goal is to keep your saturations above 90% at all times - this will help your heart  Please schedule a follow up office visit in 6 weeks, call sooner if needed with cxr    05/03/2022  f/u ov/[name] office/[name] [name] re: PH? / AFib/ Septic emboli maint on 02  2lpm prn   Chief Complaint  Patient presents with   Follow-up    Breathing is the same since last ov.  Using 2LPm cont.   Dyspnea:  doing grocery store shopping s 02 and not checking sats as rec Cough: none  Sleeping: flat bed, one pillow  SABA use: none  02: 2lpm prn      No obvious day to day or daytime variability or assoc excess/ purulent sputum or mucus plugs or hemoptysis or cp or chest tightness, subjective wheeze or overt sinus or hb symptoms.   Sleeping as above  without nocturnal  or  early am exacerbation  of respiratory  c/o's or need for noct saba. Also denies any obvious fluctuation of symptoms with weather or environmental changes or other aggravating or alleviating factors except as outlined above   No unusual exposure hx or h/o childhood pna/ asthma or knowledge of premature birth.  Current Allergies, Complete Past Medical History, Past Surgical History, Family History, and Social History were reviewed in [employer] record.  ROS  The following are not active complaints unless bolded Hoarseness, sore throat, dysphagia, dental problems, itching/skin, sneezing,  nasal congestion or discharge of excess mucus or purulent secretions, ear ache,   fever, chills, sweats, unintended wt loss or wt gain, classically pleuritic or  exertional cp,  orthopnea pnd or arm/hand swelling  or leg swelling, presyncope, palpitations, abdominal pain, anorexia, nausea, vomiting, diarrhea  or change in bowel habits or change in bladder habits, change in stools or change in urine, dysuria, hematuria,  rash, arthralgias, visual complaints, headache, numbness, weakness or ataxia or problems with walking or coordination,  change in mood or  memory.        Current Meds  Medication Sig   ADEMPAS 2.5 MG TABS Take 1 tablet by mouth 3 (three) times daily.   Cholecalciferol (VITAMIN D3) 2000 UNITS capsule Take 2,000 Units by mouth daily.   ELIQUIS 5 MG TABS tablet TAKE 1 TABLET BY MOUTH TWICE A DAY   FARXIGA 10 MG TABS tablet TAKE 1 TABLET BY MOUTH DAILY BEFORE BREAKFAST*STOP DIGOXIN*   finasteride (PROSCAR) 5 MG tablet TAKE 1 TABLET BY MOUTH EVERY DAY   furosemide (LASIX) 20 MG tablet Take 1 tablet (20 mg total) by mouth daily.   macitentan (OPSUMIT) 10 MG tablet Take 1 tablet (10 mg total) by mouth daily.   metoprolol succinate (TOPROL XL) 50 MG 24 hr tablet Take 1 tablet (50 mg total) by mouth daily. Take with or immediately following a meal.   polyethylene glycol powder (GLYCOLAX/MIRALAX) powder Take 17 g by mouth daily as needed for moderate constipation. For constipation - mix with 8 oz liquid and drink   potassium chloride (KLOR-CON M) 10 MEQ tablet Take 1 tablet (10 mEq total) by mouth daily.   pravastatin (PRAVACHOL) 40 MG tablet Take 40 mg by mouth daily.   sacubitril-valsartan (ENTRESTO) 24-26 MG Take 1 tablet by mouth 2 (two) times daily.   spironolactone (ALDACTONE) 25 MG tablet Take 1 tablet (25 mg total) by mouth daily.                Past Medical History:  Diagnosis Date   Anemia    Atrial fibrillation ([place])    AVM (arteriovenous malformation)    Duodenum - nonbleeding and EGD 11/13   Cardiac arrest ([place]) 1998   ICD implanted at [facility]   Cardiomyopathy, nonischemic ([place])    LVEF 36-46%   Chronic systolic heart failure  ([place])    Essential hypertension    History of colonic polyps    Colonoscopy 11/13   Pneumonia    Severe with respiratory failure 10/13       Objective:     05/03/2022       214    02/05/22 217 lb (98.4 kg)  01/08/22 221 lb (100.2 kg)  01/07/22 220 lb (99.8 kg)    Vital signs reviewed  05/03/2022  - Note at rest 02 sats  95% on 2lpm cont   General appearance:    elderly  bm exceptionally confused with details of care/ names of meds/  docs        HEENT : Oropharynx  clear   Nasal turbinates nl    NECK :  without  apparent JVD/ palpable Nodes/TM    LUNGS: no acc muscle use,  Min barrel  contour chest wall with bilateral  slightly decreased bs s audible wheeze and  without cough on insp or exp maneuvers and min  Hyperresonant  to  percussion bilaterally    CV:  IRIR at 110    2-3/6 sem with increase in P2, and no edema   ABD:  soft and nontender with pos end  insp Hoover's  in the supine position.  No bruits or organomegaly appreciated   MS:  Nl gait/ ext warm without deformities Or obvious joint restrictions  calf tenderness, cyanosis or clubbing     SKIN: warm and dry without lesions    NEURO:  alert, approp, nl sensorium with  no motor or cerebellar deficits apparent.            CXR PA and Lateral:   05/03/2022 :    I personally reviewed images and agree with radiology impression as follows:    masslike area of consolidation within the right mid and upper lung. Bibasilar atelectasis      Assessment

## 2022-05-04 ENCOUNTER — Encounter: Payer: Self-pay | Admitting: Internal Medicine

## 2022-05-04 NOTE — Assessment & Plan Note (Addendum)
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   Needs f/u with pfts when available          Each maintenance medication was reviewed in detail including emphasizing most importantly the difference between maintenance and prns and under what circumstances the prns are to be triggered using an action plan format where appropriate.  Total time for H and P, chart review, counseling, reviewing 02 device(s) , directly observing portions of ambulatory 02 saturation study/ and generating customized AVS unique to this office visit / same day charting = 33 min

## 2022-05-15 DEATH — deceased

## 2022-05-17 ENCOUNTER — Other Ambulatory Visit (HOSPITAL_COMMUNITY): Payer: Self-pay | Admitting: Cardiology

## 2022-06-18 ENCOUNTER — Encounter: Payer: Medicare HMO | Admitting: Internal Medicine

## 2022-06-30 ENCOUNTER — Other Ambulatory Visit: Payer: Medicare HMO

## 2022-07-07 ENCOUNTER — Ambulatory Visit: Payer: Medicare HMO | Admitting: Cardiology

## 2022-07-09 ENCOUNTER — Encounter (HOSPITAL_COMMUNITY): Payer: Medicare HMO | Admitting: Cardiology

## 2022-12-23 ENCOUNTER — Ambulatory Visit: Payer: Medicare HMO | Admitting: Urology

## 2022-12-30 ENCOUNTER — Other Ambulatory Visit: Payer: Medicare HMO

## 2023-01-06 ENCOUNTER — Ambulatory Visit: Payer: Medicare HMO | Admitting: Urology
